# Patient Record
Sex: Male | Born: 1950
Health system: Southern US, Community
[De-identification: ages and names within clinical notes are randomized; demographics above are authoritative.]

## PROBLEM LIST (undated history)

## (undated) DIAGNOSIS — J069 Acute upper respiratory infection, unspecified: Secondary | ICD-10-CM

## (undated) DIAGNOSIS — Z952 Presence of prosthetic heart valve: Secondary | ICD-10-CM

## (undated) DIAGNOSIS — Z8614 Personal history of Methicillin resistant Staphylococcus aureus infection: Secondary | ICD-10-CM

## (undated) DIAGNOSIS — G4700 Insomnia, unspecified: Secondary | ICD-10-CM

## (undated) DIAGNOSIS — M51379 Other intervertebral disc degeneration, lumbosacral region without mention of lumbar back pain or lower extremity pain: Secondary | ICD-10-CM

## (undated) DIAGNOSIS — E785 Hyperlipidemia, unspecified: Secondary | ICD-10-CM

## (undated) DIAGNOSIS — E119 Type 2 diabetes mellitus without complications: Secondary | ICD-10-CM

## (undated) DIAGNOSIS — M5137 Other intervertebral disc degeneration, lumbosacral region: Secondary | ICD-10-CM

## (undated) DIAGNOSIS — K5790 Diverticulosis of intestine, part unspecified, without perforation or abscess without bleeding: Secondary | ICD-10-CM

## (undated) DIAGNOSIS — F329 Major depressive disorder, single episode, unspecified: Secondary | ICD-10-CM

## (undated) DIAGNOSIS — K449 Diaphragmatic hernia without obstruction or gangrene: Secondary | ICD-10-CM

## (undated) DIAGNOSIS — I219 Acute myocardial infarction, unspecified: Secondary | ICD-10-CM

## (undated) DIAGNOSIS — I2510 Atherosclerotic heart disease of native coronary artery without angina pectoris: Secondary | ICD-10-CM

## (undated) DIAGNOSIS — Z87438 Personal history of other diseases of male genital organs: Secondary | ICD-10-CM

## (undated) DIAGNOSIS — K219 Gastro-esophageal reflux disease without esophagitis: Secondary | ICD-10-CM

## (undated) DIAGNOSIS — D696 Thrombocytopenia, unspecified: Secondary | ICD-10-CM

## (undated) DIAGNOSIS — N429 Disorder of prostate, unspecified: Secondary | ICD-10-CM

## (undated) DIAGNOSIS — I10 Essential (primary) hypertension: Secondary | ICD-10-CM

## (undated) DIAGNOSIS — I350 Nonrheumatic aortic (valve) stenosis: Secondary | ICD-10-CM

## (undated) DIAGNOSIS — M1990 Unspecified osteoarthritis, unspecified site: Secondary | ICD-10-CM

## (undated) DIAGNOSIS — I639 Cerebral infarction, unspecified: Secondary | ICD-10-CM

## (undated) DIAGNOSIS — I213 ST elevation (STEMI) myocardial infarction of unspecified site: Secondary | ICD-10-CM

## (undated) DIAGNOSIS — F32A Depression, unspecified: Secondary | ICD-10-CM

## (undated) DIAGNOSIS — IMO0001 Reserved for inherently not codable concepts without codable children: Secondary | ICD-10-CM

## (undated) HISTORY — DX: Presence of prosthetic heart valve: Z95.2

## (undated) HISTORY — DX: Thrombocytopenia, unspecified: D69.6

## (undated) HISTORY — DX: Personal history of Methicillin resistant Staphylococcus aureus infection: Z86.14

## (undated) HISTORY — DX: Cerebral infarction, unspecified: I63.9

## (undated) HISTORY — DX: Depression, unspecified: F32.A

## (undated) HISTORY — DX: Hyperlipidemia, unspecified: E78.5

## (undated) HISTORY — DX: Reserved for inherently not codable concepts without codable children: IMO0001

## (undated) HISTORY — DX: Diaphragmatic hernia without obstruction or gangrene: K44.9

## (undated) HISTORY — DX: Gastro-esophageal reflux disease without esophagitis: K21.9

## (undated) HISTORY — DX: Type 2 diabetes mellitus without complications: E11.9

## (undated) HISTORY — DX: Atherosclerotic heart disease of native coronary artery without angina pectoris: I25.10

## (undated) HISTORY — DX: Personal history of other diseases of male genital organs: Z87.438

## (undated) HISTORY — DX: Other intervertebral disc degeneration, lumbosacral region: M51.37

## (undated) HISTORY — DX: Other intervertebral disc degeneration, lumbosacral region without mention of lumbar back pain or lower extremity pain: M51.379

## (undated) HISTORY — DX: Disorder of prostate, unspecified: N42.9

## (undated) HISTORY — DX: Essential (primary) hypertension: I10

## (undated) HISTORY — DX: Diverticulosis of intestine, part unspecified, without perforation or abscess without bleeding: K57.90

## (undated) HISTORY — PX: NASAL SEPTUM SURGERY: SHX37

## (undated) HISTORY — DX: Insomnia, unspecified: G47.00

## (undated) HISTORY — DX: Major depressive disorder, single episode, unspecified: F32.9

## (undated) HISTORY — DX: Nonrheumatic aortic (valve) stenosis: I35.0

---

## 2003-12-18 ENCOUNTER — Ambulatory Visit (HOSPITAL_BASED_OUTPATIENT_CLINIC_OR_DEPARTMENT_OTHER): Admission: RE | Admit: 2003-12-18 | Discharge: 2003-12-18 | Payer: Self-pay | Admitting: Otolaryngology

## 2003-12-18 ENCOUNTER — Encounter (INDEPENDENT_AMBULATORY_CARE_PROVIDER_SITE_OTHER): Payer: Self-pay | Admitting: *Deleted

## 2003-12-18 ENCOUNTER — Ambulatory Visit (HOSPITAL_COMMUNITY): Admission: RE | Admit: 2003-12-18 | Discharge: 2003-12-18 | Payer: Self-pay | Admitting: Otolaryngology

## 2004-05-31 ENCOUNTER — Encounter (INDEPENDENT_AMBULATORY_CARE_PROVIDER_SITE_OTHER): Payer: Self-pay | Admitting: Specialist

## 2004-05-31 ENCOUNTER — Ambulatory Visit (HOSPITAL_COMMUNITY): Admission: RE | Admit: 2004-05-31 | Discharge: 2004-05-31 | Payer: Self-pay | Admitting: Gastroenterology

## 2004-05-31 ENCOUNTER — Encounter: Payer: Self-pay | Admitting: Gastroenterology

## 2005-02-25 ENCOUNTER — Emergency Department (HOSPITAL_COMMUNITY): Admission: EM | Admit: 2005-02-25 | Discharge: 2005-02-25 | Payer: Self-pay | Admitting: Emergency Medicine

## 2005-02-25 IMAGING — CR DG RIBS W/ CHEST 3+V*L*
3 series · 3 of 3 positions shown · non-contrast
Comparison: None.

CLINICAL DATA: Left-sided chest pain. 
 LEFT RIBS WITH UNILATERAL CHEST ? 3 VIEW:

[view not recorded (1 of 3)]
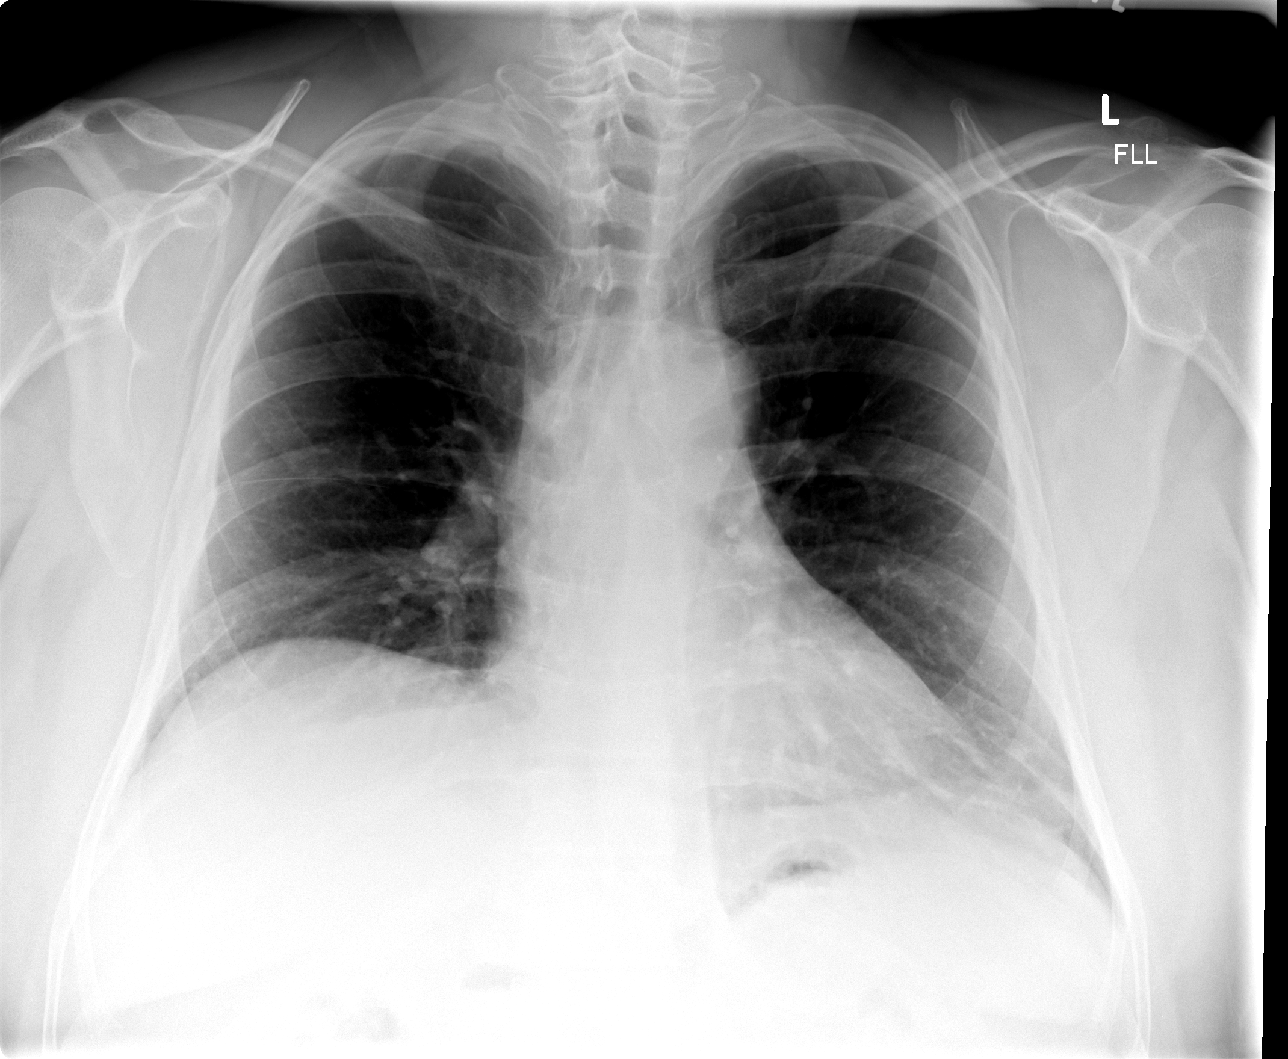

[view not recorded (2 of 3)]
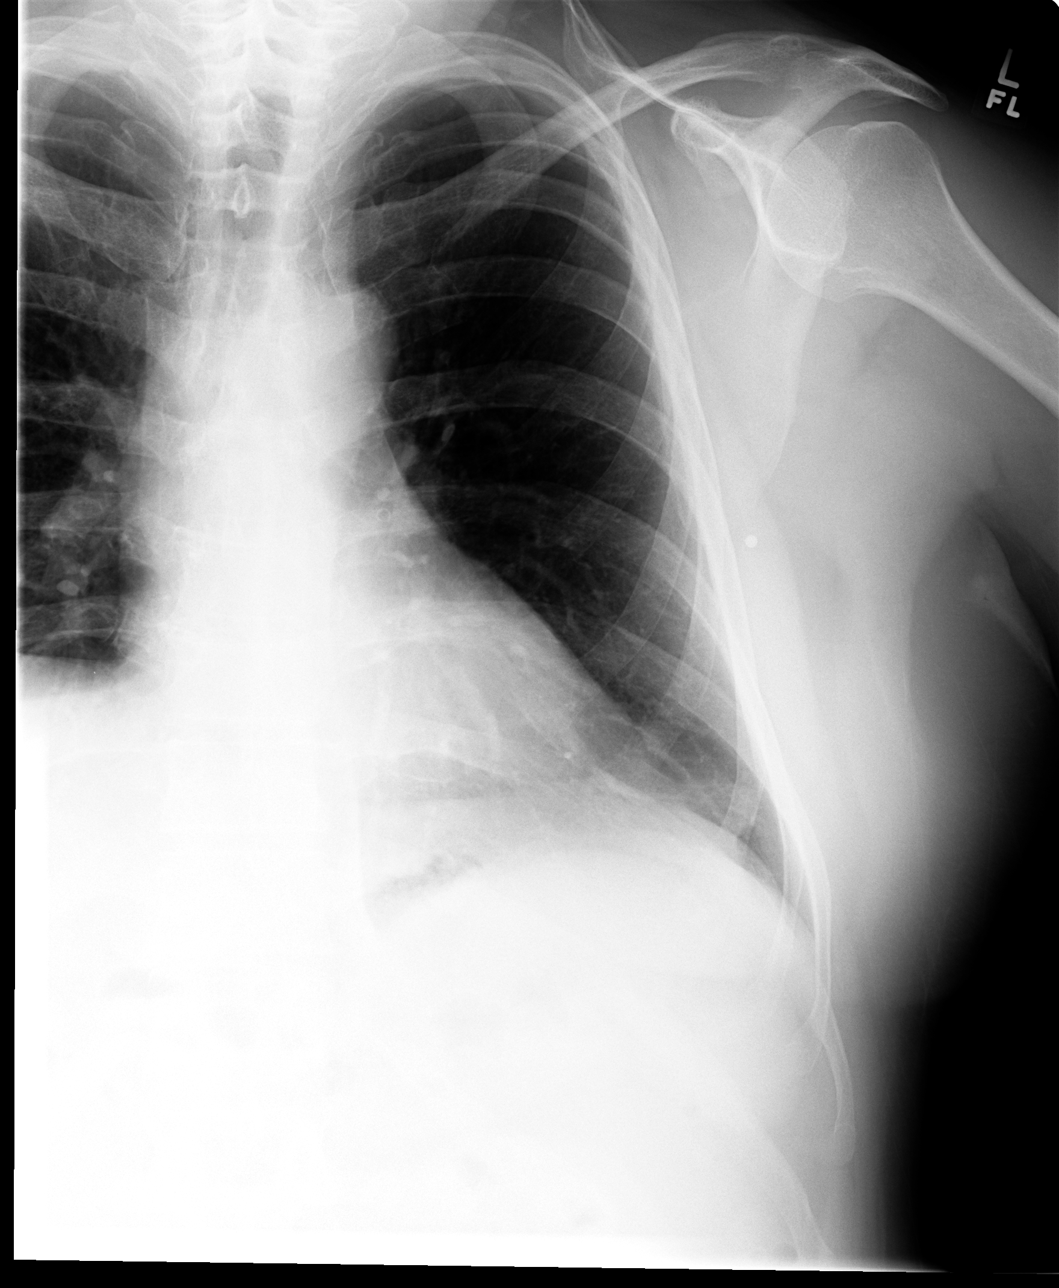

[view not recorded (3 of 3)]
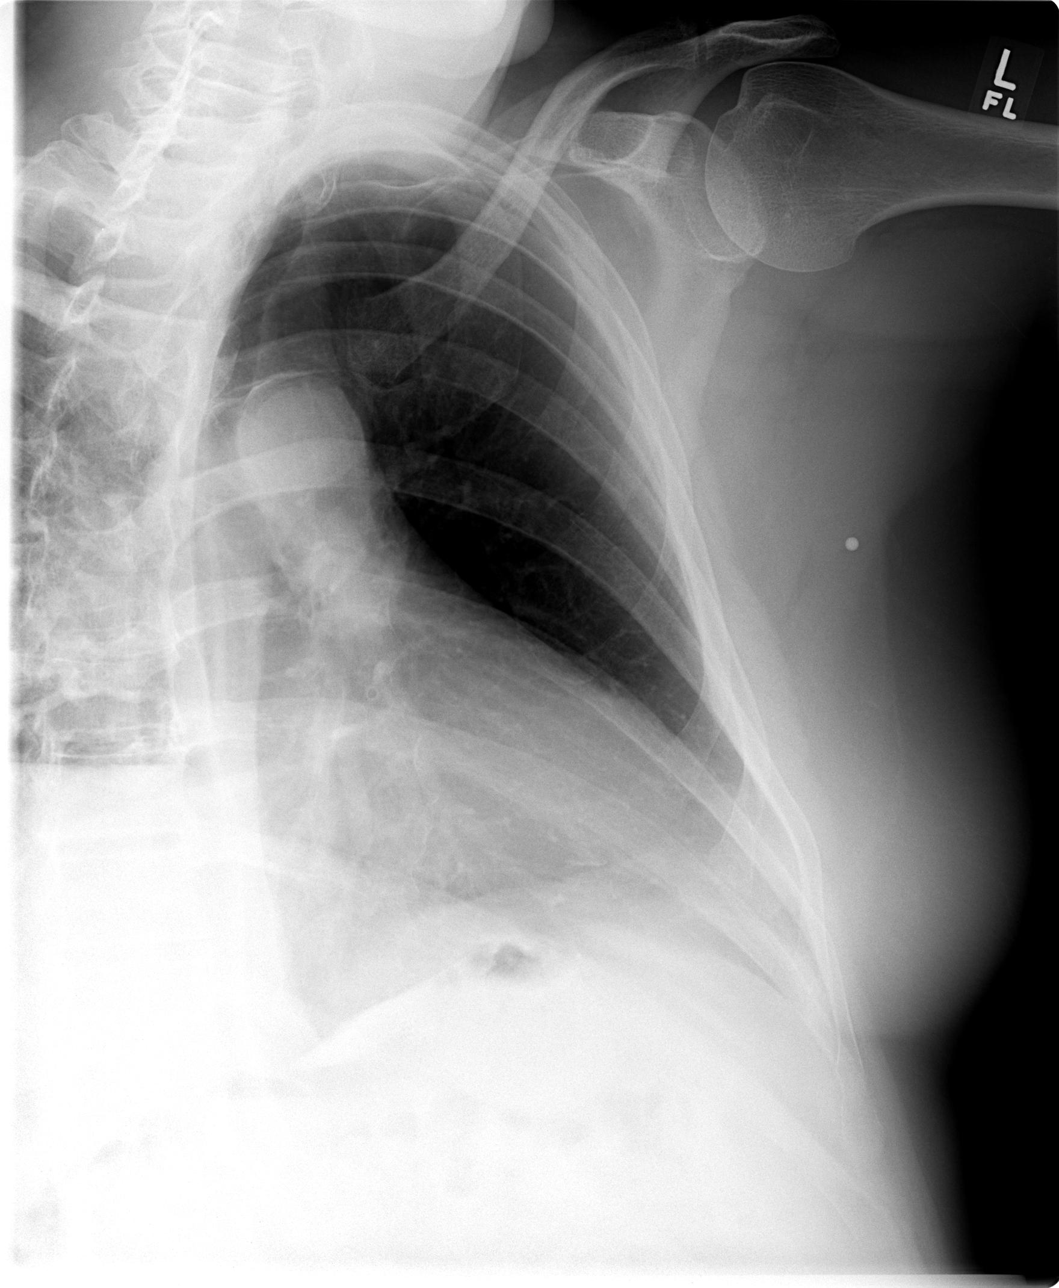

[3 of 3 positions shown; findings below may reference images not displayed]

FINDINGS: PA chest plus coned-down left rib views were obtained with a BB placed at the site of the left chest wall pain.  
 The heart and mediastinum were normal.  There is some streaky atelectasis or scar at the left lung base.  No consolidation or pleural fluid.  There are no definitive rib fractures; however, on the coned-down oblique view, there is a stepoff of the cortex of the anterior third rib, suspicious for fracture.
IMPRESSION: 1.  Probable subtle fracture of the left anterior third rib.
 2.  No other definite rib fractures. 
 3.  Streaky atelectasis or scarring at the left base.

## 2007-04-29 ENCOUNTER — Encounter: Payer: Self-pay | Admitting: Gastroenterology

## 2007-05-14 ENCOUNTER — Encounter: Payer: Self-pay | Admitting: Gastroenterology

## 2007-05-17 ENCOUNTER — Encounter: Payer: Self-pay | Admitting: Gastroenterology

## 2007-05-26 ENCOUNTER — Encounter: Payer: Self-pay | Admitting: Gastroenterology

## 2007-07-08 ENCOUNTER — Ambulatory Visit: Payer: Self-pay | Admitting: Gastroenterology

## 2007-07-08 DIAGNOSIS — R1012 Left upper quadrant pain: Secondary | ICD-10-CM

## 2007-07-08 DIAGNOSIS — D126 Benign neoplasm of colon, unspecified: Secondary | ICD-10-CM

## 2007-07-08 DIAGNOSIS — I10 Essential (primary) hypertension: Secondary | ICD-10-CM | POA: Insufficient documentation

## 2007-07-08 DIAGNOSIS — E785 Hyperlipidemia, unspecified: Secondary | ICD-10-CM

## 2007-07-08 DIAGNOSIS — F3289 Other specified depressive episodes: Secondary | ICD-10-CM | POA: Insufficient documentation

## 2007-07-08 DIAGNOSIS — E782 Mixed hyperlipidemia: Secondary | ICD-10-CM | POA: Insufficient documentation

## 2007-07-08 DIAGNOSIS — F329 Major depressive disorder, single episode, unspecified: Secondary | ICD-10-CM

## 2007-07-08 DIAGNOSIS — K5730 Diverticulosis of large intestine without perforation or abscess without bleeding: Secondary | ICD-10-CM | POA: Insufficient documentation

## 2007-07-21 ENCOUNTER — Ambulatory Visit: Payer: Self-pay | Admitting: Gastroenterology

## 2007-07-21 ENCOUNTER — Encounter: Payer: Self-pay | Admitting: Gastroenterology

## 2007-07-21 ENCOUNTER — Ambulatory Visit (HOSPITAL_COMMUNITY): Admission: RE | Admit: 2007-07-21 | Discharge: 2007-07-21 | Payer: Self-pay | Admitting: Gastroenterology

## 2007-07-21 LAB — CONVERTED CEMR LAB
BUN: 12 mg/dL (ref 6–23)
Creatinine, Ser: 0.8 mg/dL (ref 0.4–1.5)

## 2007-07-21 IMAGING — US US ABDOMEN COMPLETE
1 series · 13 of 25 positions shown · non-contrast
Comparison: None

CLINICAL DATA: Abdominal pain

ABDOMEN ULTRASOUND
TECHNIQUE: Complete abdominal ultrasound examination was performed
including evaluation of the liver, gallbladder, bile ducts,
pancreas, kidneys, spleen, IVC, and abdominal aorta.

[Series 1: unknown · 0.38mm/px · 13 of 97 slices shown]
[im 1/97]
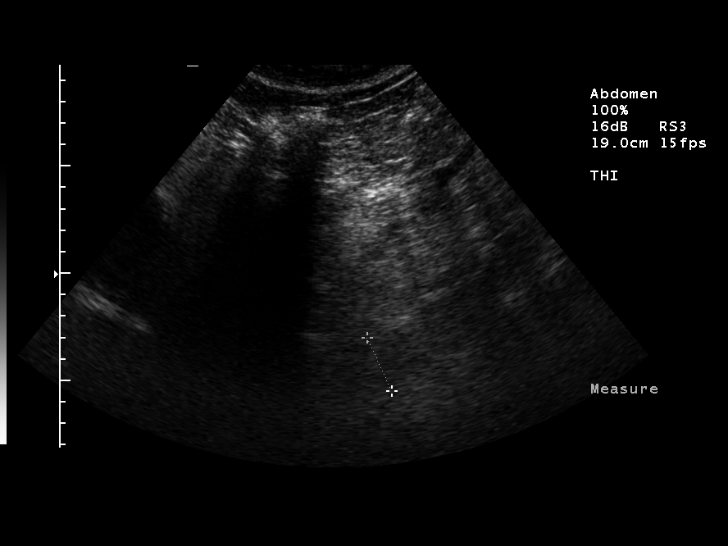
[im 9/97]
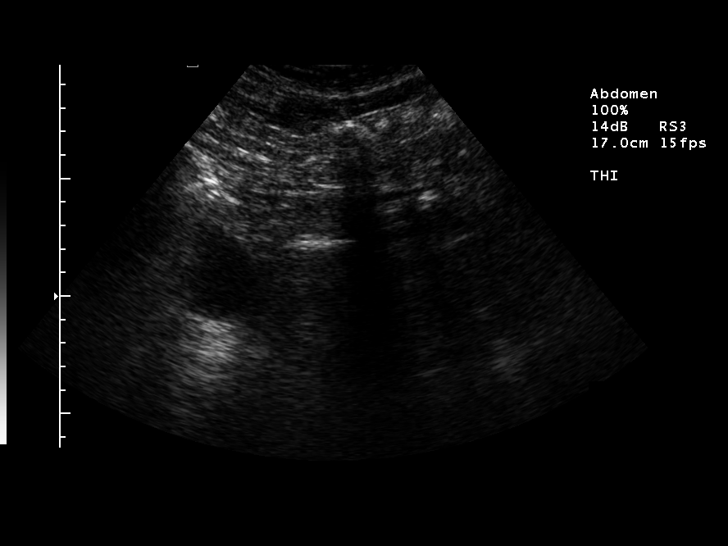
[im 17/97]
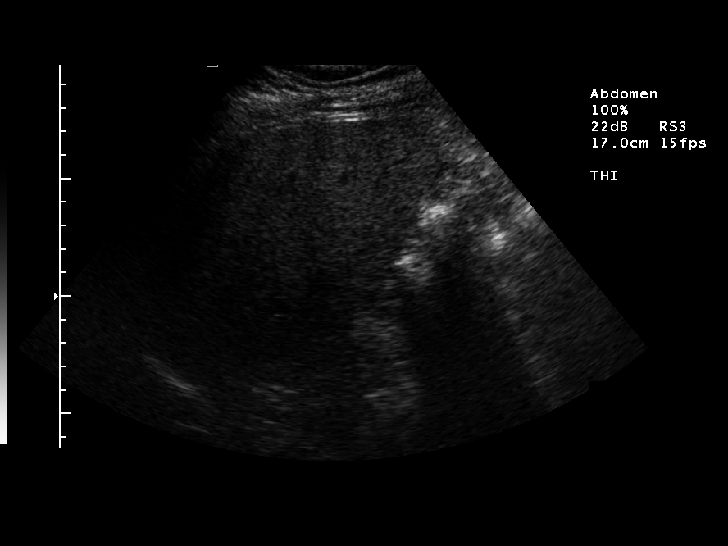
[im 25/97]
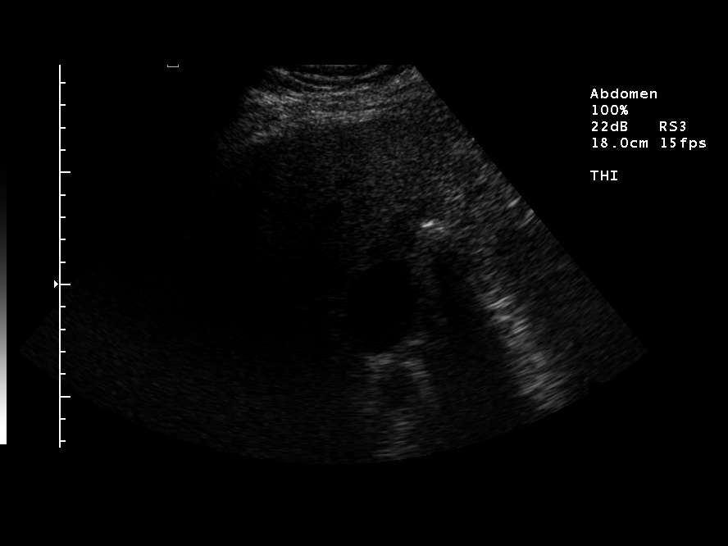
[im 33/97]
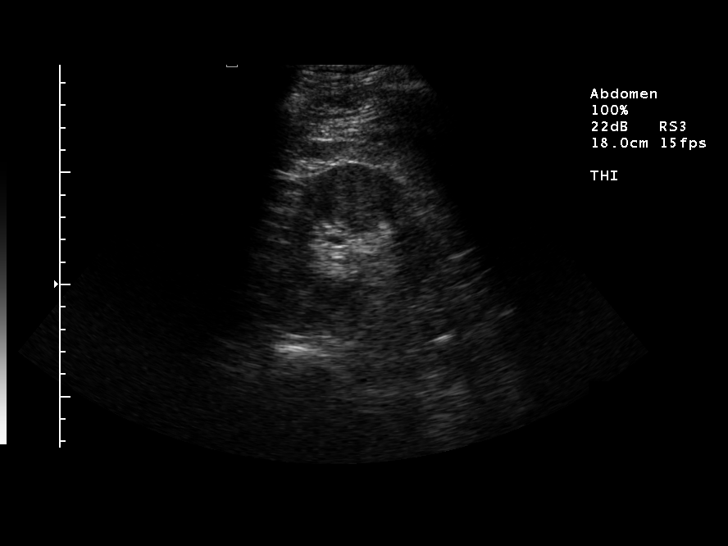
[im 41/97]
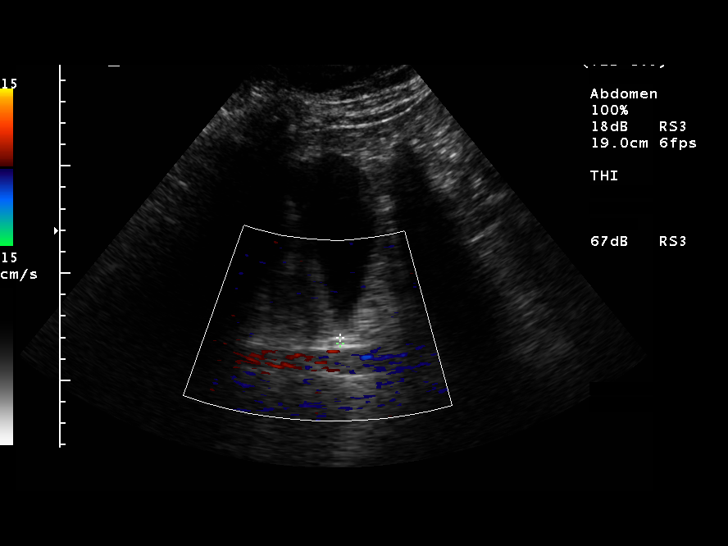
[im 49/97]
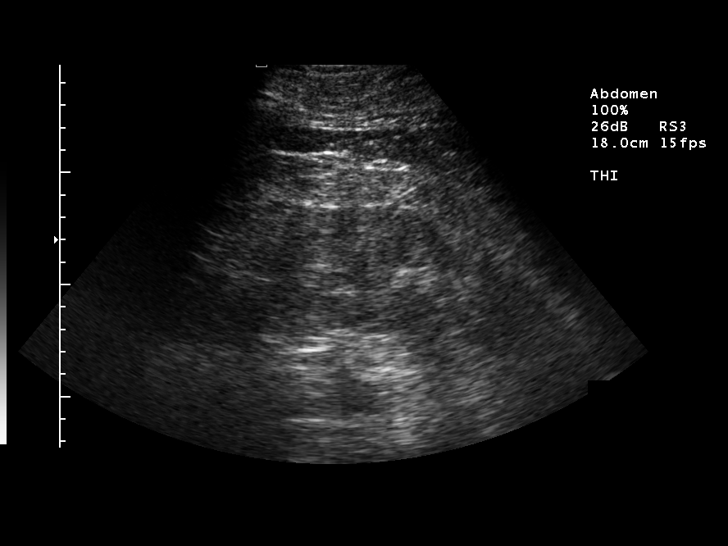
[im 57/97]
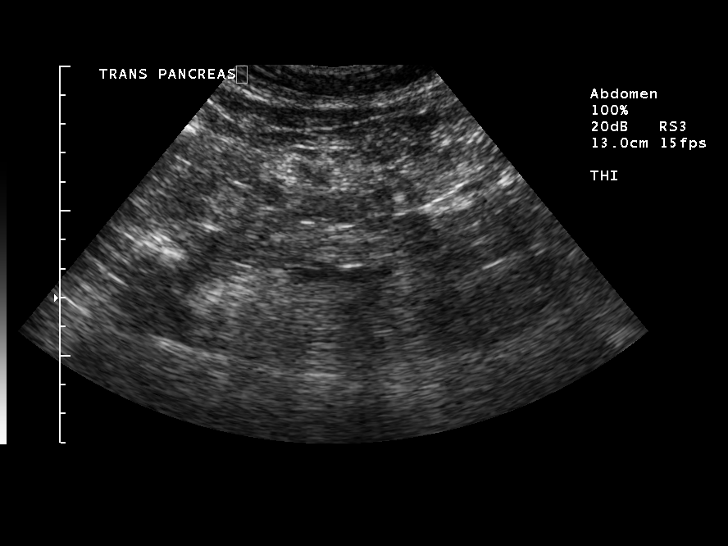
[im 65/97]
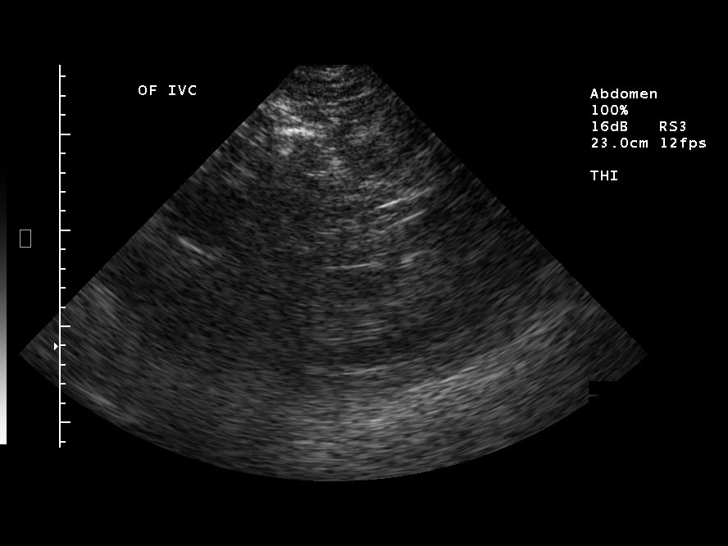
[im 73/97]
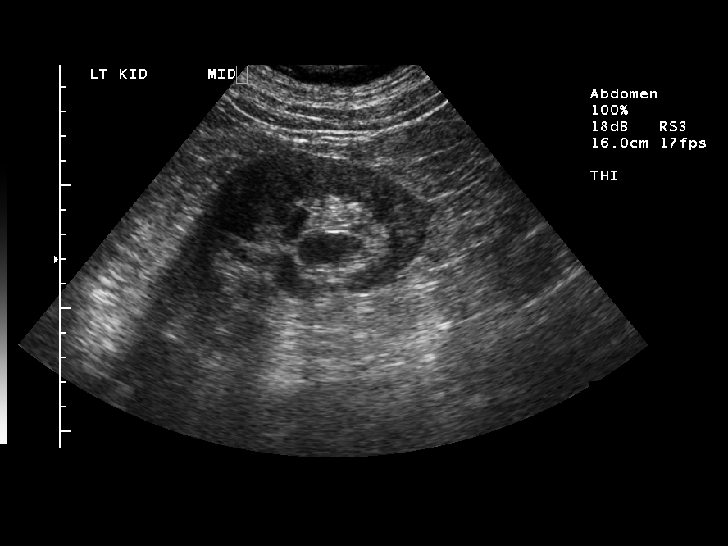
[im 81/97]
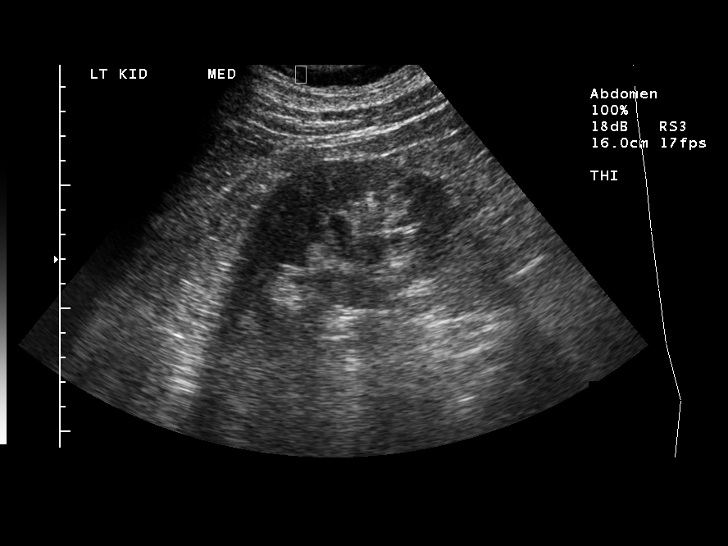
[im 89/97]
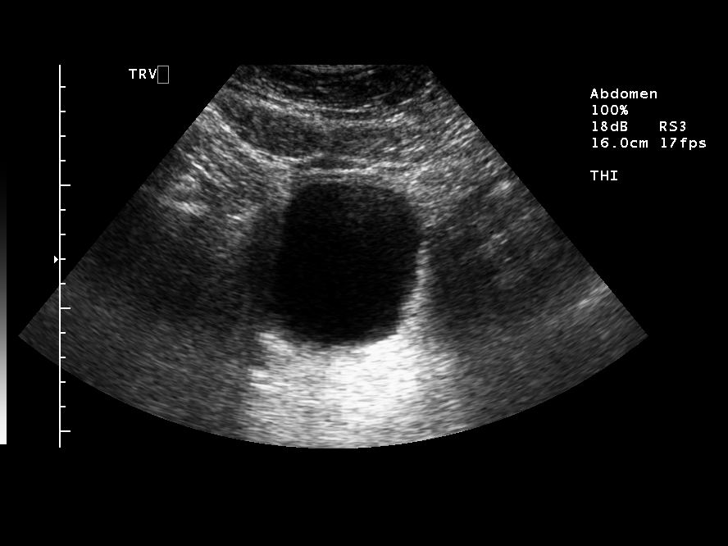
[im 97/97]
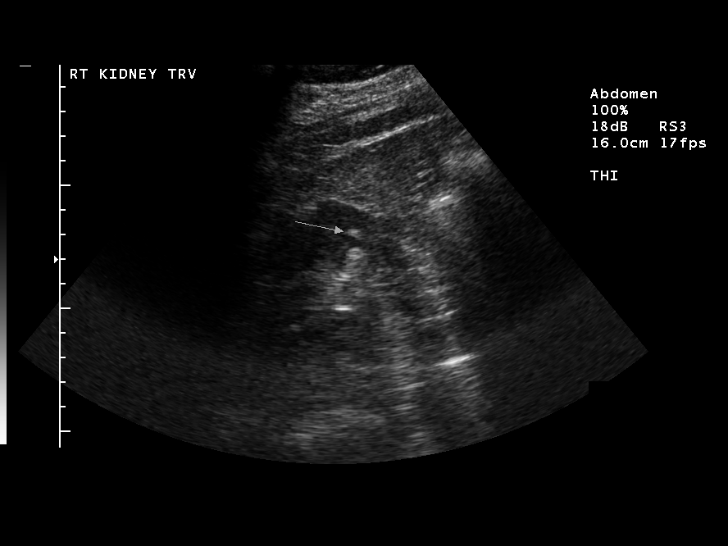

[13 of 25 positions shown; findings below may reference images not displayed]

FINDINGS: There is mild to moderate left hydronephrosis with a
shadowing echogenic stone in the left renal pelvis.  The left
ureter is not well seen.  Left kidney measures 11.1 cm.  The right
kidney measures 10.6 cm with a round echogenic mass-like area in
the cortex measuring 7 mm.

There is dense shadowing with superficial increased echogenicity in
the region of the left hepatic lobe.  Proximal common duct measures
3 mm.  The gallbladder is unremarkable.  The intrahepatic IVC,
pancreas, and proximal and distal abdominal aorta were obscured by
bowel gas.  Mid abdominal aorta measures 2.1 cm.
IMPRESSION: Poor visualization of the left hepatic lobe with possible atrophy
and peripheral calcification.  Further evaluation with liver mass
protocol CT abdomen and CT pelvis with IV contrast and oral
contrast is recommended.  This could be artifactual, although this
raises the question of neoplastic etiologies such as
cholangiocarcinoma or possibly metastasis.

Moderate left hydronephrosis with left renal pelvic calculus.  The
more distal ureter is not evaluated but could be further evaluated
on CT as described above.

Echogenic right renal mass, possibly angiomyolipoma or stone within
a calix.

Findings called to Dr. PLA by Dr. PLA [DATE] at [DATE].

## 2007-07-23 ENCOUNTER — Encounter: Payer: Self-pay | Admitting: Gastroenterology

## 2007-07-26 ENCOUNTER — Ambulatory Visit: Payer: Self-pay | Admitting: Cardiovascular Disease

## 2007-07-26 IMAGING — CT CT ABDOMEN WO/W CM
2 of 9 series · 12 of 46 positions shown, 19 images · IV contrast (Omnipaque 300)
Comparison: [DATE] ultrasound exam.

CLINICAL DATA: Query liver mass on ultrasound.  Left
hydronephrosis.  Suspicion for right renal mass on ultrasound
prostate gland enlargement.  The pelvis is otherwise negative.

CT ABDOMEN WITHOUT AND WITH CONTRAST,CT PELVIS WITH CONTRAST
TECHNIQUE: Multidetector CT imaging of the abdomen was performed
following the standard protocol before and during bolus
administration of intravenous contrast.,Technique:  Multidetector
CT imaging of the pelvis was performed following the standard proto
Contrast: 125 ml [XT] IV and oral contrast media.

[Series 7: ap_p venous 5.0 b30f · axial · portal-venous · 0.89mm/px · z∈[-456,-56]mm · 9 of 102 slices shown, 15 images]
[im 11/102  soft-tissue]
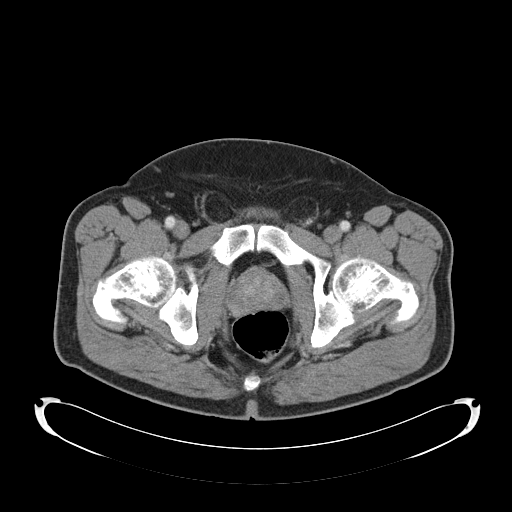
[im 11/102  bone]
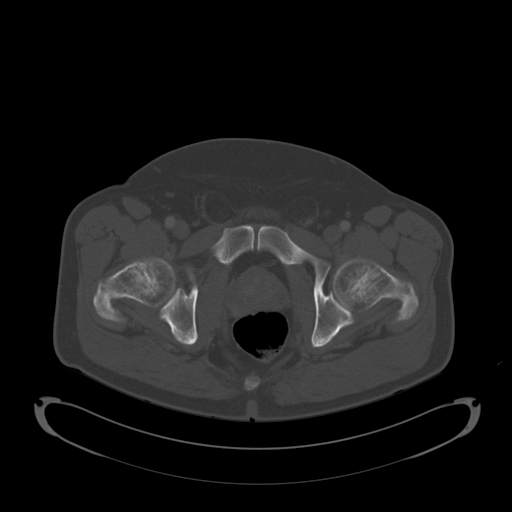
[im 21/102  soft-tissue]
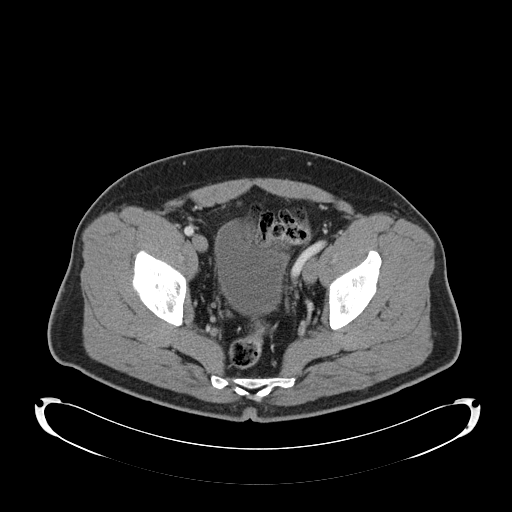
[im 31/102  soft-tissue]
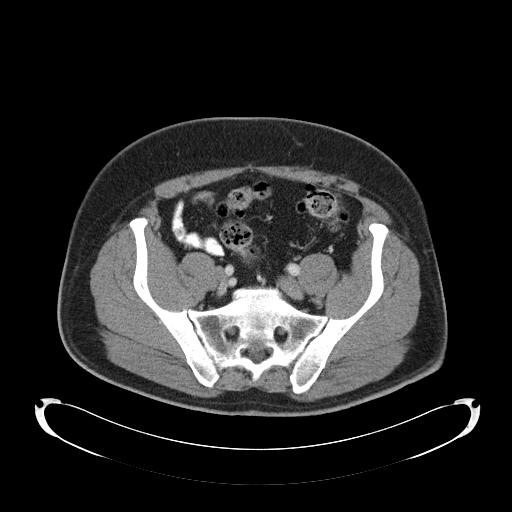
[im 41/102  soft-tissue]
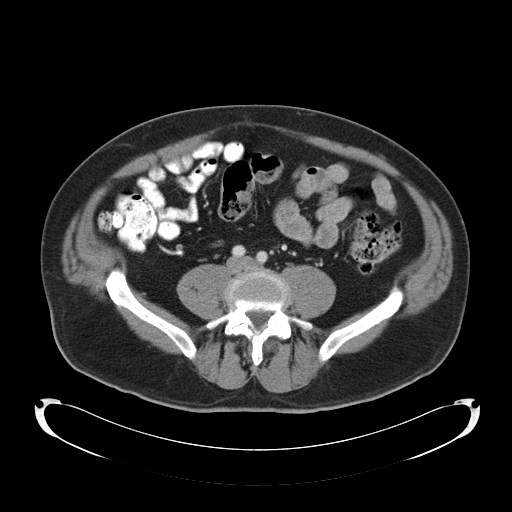
[im 51/102  soft-tissue]
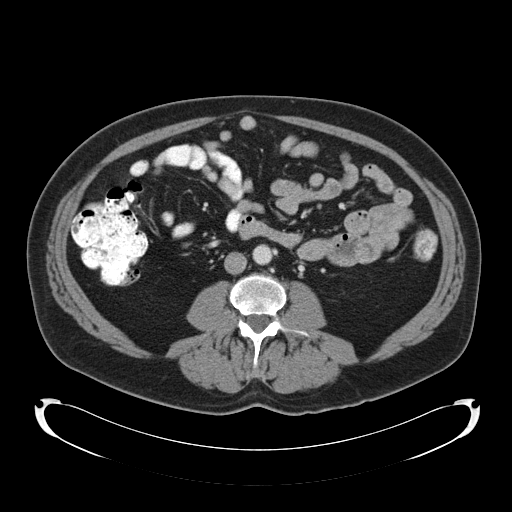
[im 61/102  soft-tissue]
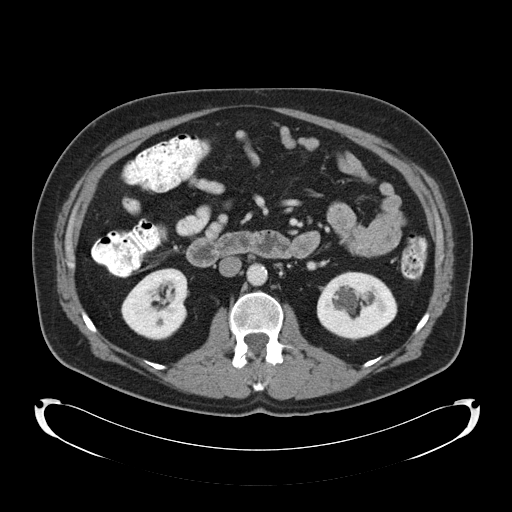
[im 61/102  lung]
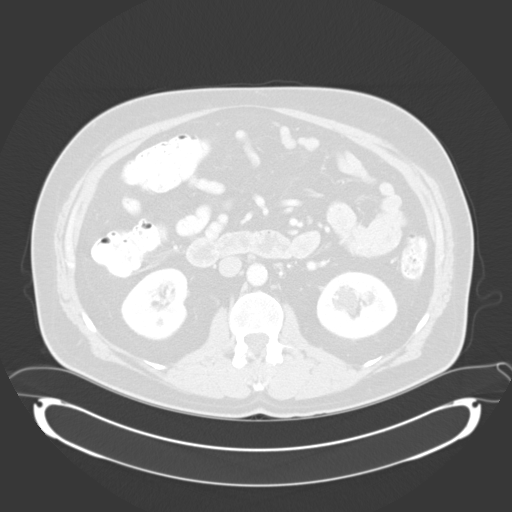
[im 71/102  soft-tissue]
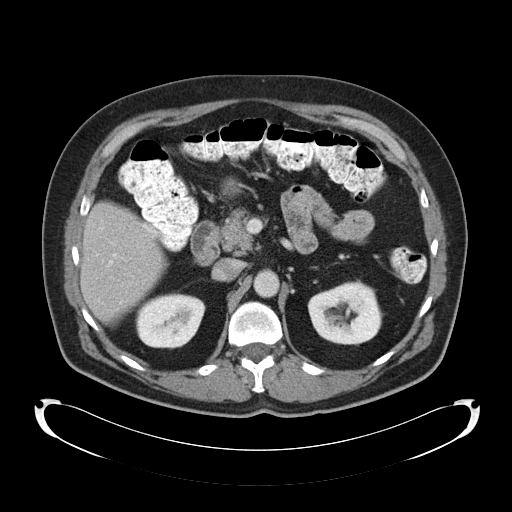
[im 71/102  lung]
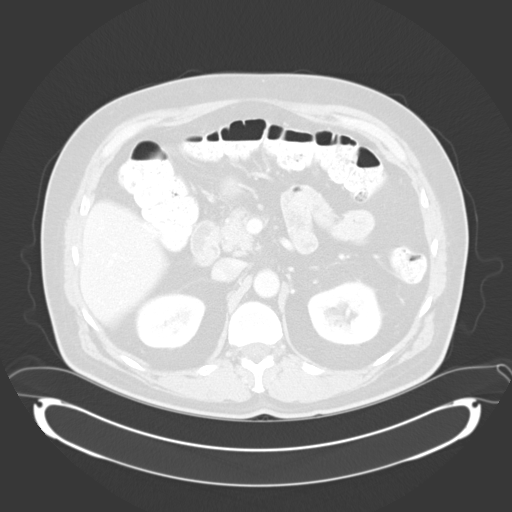
[im 81/102  soft-tissue]
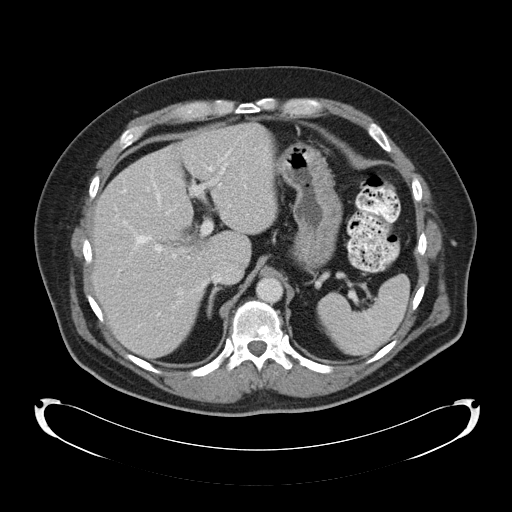
[im 81/102  lung]
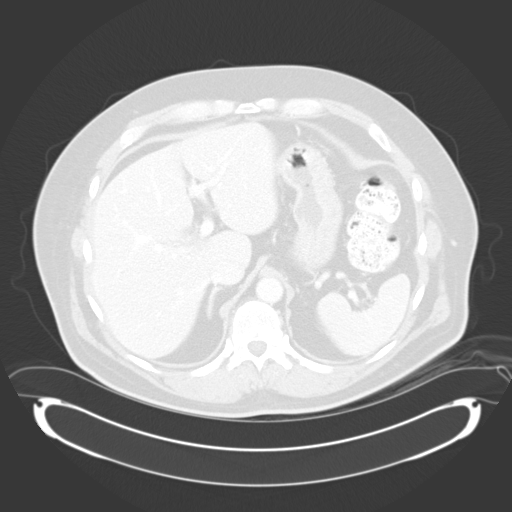
[im 91/102  soft-tissue]
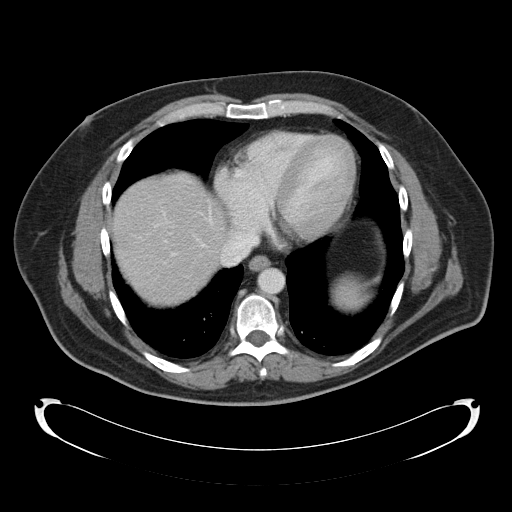
[im 91/102  lung]
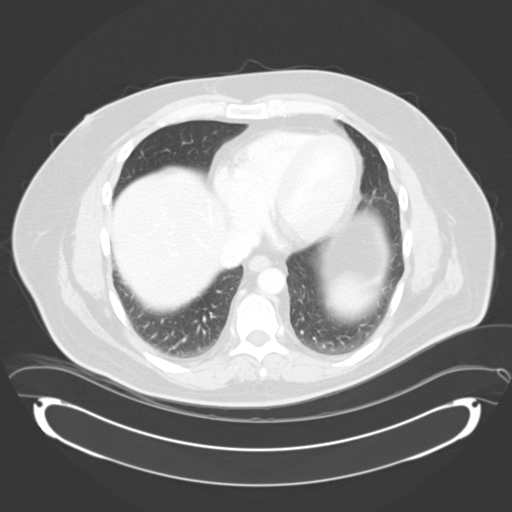
[im 91/102  bone]
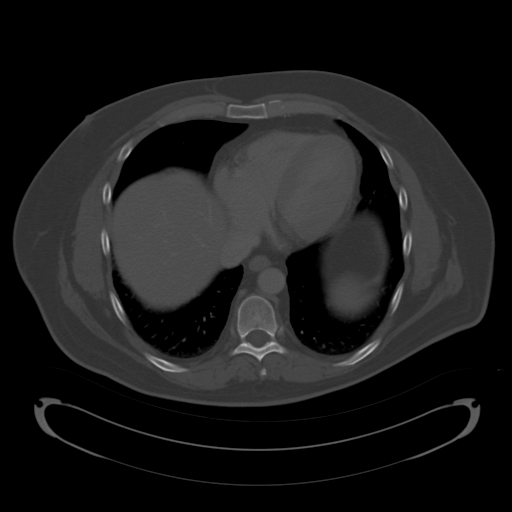

[Series 602: <mpr thick range> · coronal · 0.89mm/px · 3 of 100 slices shown, 4 images]
[im 25/100  soft-tissue]
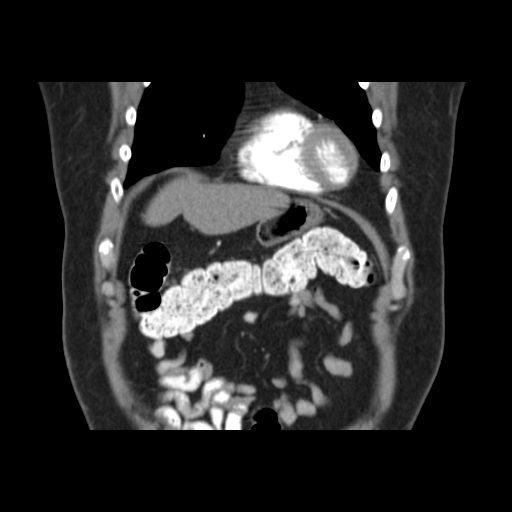
[im 50/100  soft-tissue]
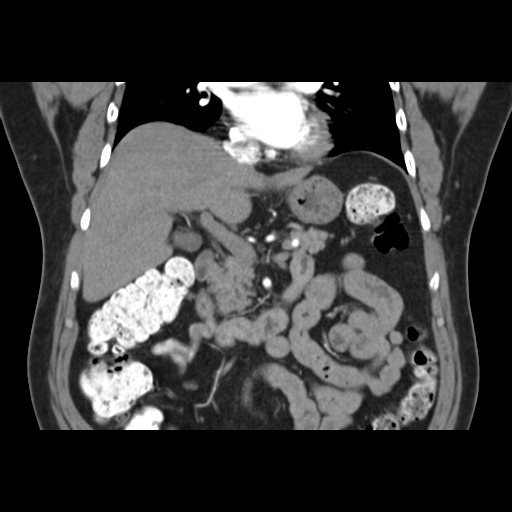
[im 50/100  bone]
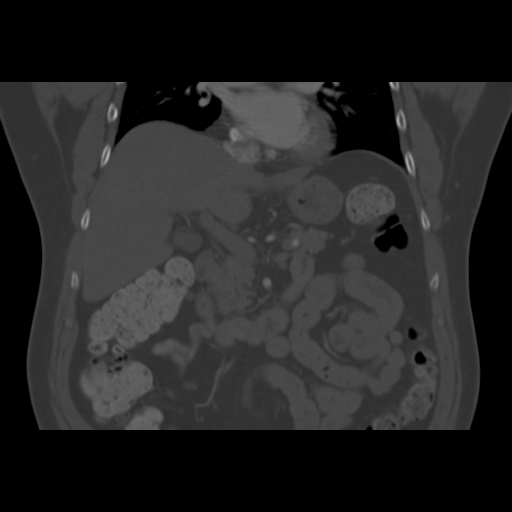
[im 75/100  soft-tissue]
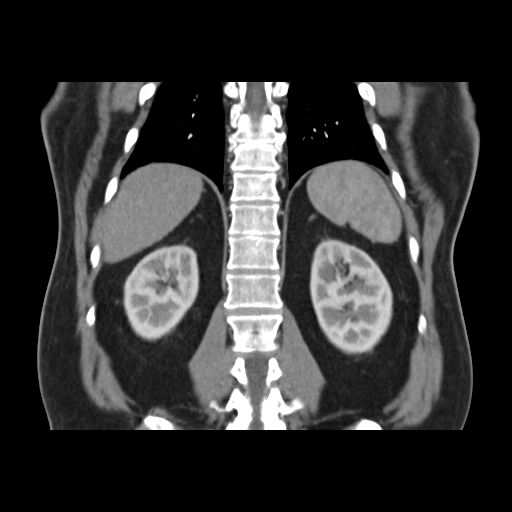

[12 of 46 positions shown; findings below may reference images not displayed]

FINDINGS: There is no evidence of an hepatic mass.  Liver is
homogeneous in attenuation.  No biliary duct dilatation.  Pancreas,
spleen, and adrenal glands appear unremarkable.  Left renal artery
aneurysm is noted within the anterior aspect of the left renal
hilus.  The aneurysm measures 1.6 x 2.2 x 1.9 mm. (see transaxial
image 38, series 7).  Moderate left hydronephrosis.  Dilatation of
the left calyces and renal pelvis.  No ureteral dilatation.  No
evidence of an obstructing calculus.  The findings are compatible
with a left UPJ stenosis. No renal calculus. No parenchymal
atrophy.  7 - 8 mm cyst in the posterior aspect of the mid right
kidney. Portocaval lymph node towards the upper limits of normal in
size measuring 9 mm (image 26).  No pathologically enlarged lymph
nodes. Scattered colonic diverticula.
IMPRESSION: Left renal artery aneurysm.  Unremarkable liver.  Left
hydronephrosis consistent with UPJ stenosis.

CT PELVIS WITH CONTRAST
FINDINGS: PROSTATE GLAND IS PROMINENT SIZE MEASURING 5 CM IN
TRANSVERSE DIMENSION.  SEMINAL VESICLES AND BLADDER UNREMARKABLE.
SIGMOID COLON DIVERTICULA.  NEGATIVE FOR DIVERTICULITIS.
IMPRESSION: PROSTATE GLAND ENLARGEMENT.  COLONIC DIVERTICULAR
DISEASE.

## 2007-07-27 ENCOUNTER — Encounter: Payer: Self-pay | Admitting: Gastroenterology

## 2010-07-12 NOTE — Op Note (Signed)
NAME:  Darrell [name], Darrell [name]                ACCOUNT NO.:  [id number]   MEDICAL RECORD NO.:  [id number]          PATIENT TYPE:  AMB   LOCATION:  DSC                          FACILITY:  [facility]   PHYSICIAN:  Jefry H. Pollyann Kennedy, MD     DATE OF BIRTH:  1950/06/05   DATE OF PROCEDURE:  12/18/2003  DATE OF DISCHARGE:                                 OPERATIVE REPORT   PREOPERATIVE DIAGNOSES:  1.  Nasal septal deviation.  2.  Inferior turbinate hypertrophy.   POSTOPERATIVE DIAGNOSES:  1.  Nasal septal deviation.  2.  Inferior turbinate hypertrophy.  3.  Nasal polyposis.   OPERATION PERFORMED:  1.  Nasal septoplasty.  2.  Bilateral submucous resection of inferior turbinates.  3.  Bilateral nasal polypectomy.   SURGEON:  Jefry H. Pollyann Kennedy, MD   ANESTHESIA:  General endotracheal.   COMPLICATIONS:  None.  The patient tolerated the procedure well, was  awakened, extubated and transferred to recovery in stable condition.   ESTIMATED BLOOD LOSS:  40 mL.   FINDINGS:  Severe leftward nasal septal deviation anteriorly with a  posterior rightward deviation.  Bilateral inferior turbinate hypertrophy.  Bilateral middle and superior meatus polypoid disease extensive.   INDICATIONS FOR PROCEDURE:  The patient is a 61 year old gentleman with a  longstanding severe nasal obstruction that has failed medical therapy.  He  has a history of nasal trauma in the past that preceded this obstruction.  The risks, benefits, alternatives and complications of the procedure were  explained to the patient, who seemed to understand and agreed to surgery.   DESCRIPTION OF PROCEDURE:  The patient was taken to the operating room and  placed on the operating table in the supine position.  Following induction  of general endotracheal anesthesia, the face was prepped and draped in  standard fashion.  Oxymetazoline spray was used preoperatively in the nasal  cavities.  1% Xylocaine with epinephrine was infiltrated into the septum,  the columella and the inferior turbinates bilaterally.  Somewas also  injected into the left polypoid mass that was visualized after decongesting  the nose.  Afrin-soaked pledgets were placed bilaterally in the nasal  cavities as well.   (1) Nasal septoplasty.  A left hemitransfixion incision was created with a  15 scalpel and a mucosal flap was developed down the left side using a Market researcher.  There was a large spur along the left side of the attachment of  the maxillary crest and the quadrangular cartilage and continuing back to  the vomerian bone.  Care was taken to elevate mucosa without tearing it.  The bony cartilaginous junction was divided and a similar mucosal flap was  developed down the right side posteriorly to the sphenoid as well.  The  ethmoid plate was severely deflected to the left and was resected in its  entirety using Jansen-Middleton rongeurs.  The vomer was removed as well  because of the severe deflection.  A 4 mm osteotome was used to shave off  part of the maxillary crest on the left to relieve the anterior obstruction.  The remainder of  the cartilage was midline and straight and was kept intact.  The mucosa incision was reapproximated with a 4-0 chromic suture.  A plain  gut 4-0 quilting suture was then used to quilt the mucosal flaps.   (2) Submucous resection of inferior turbinates bilaterally.  The leading  edge of the inferior turbinates was incised in a vertical fashion with a 15  scalpel.  A Freer elevator was used to elevate mucosa off of the turbinate  bone bilaterally.  Fragments of bone were resected using Takahashi forceps.  The turbinate remnants were outfractured with a Therapist, nutritional.  After the  nose was decongested at the beginning of the procedure, it became evident  that there was severe polypoid disease bilaterally.  As much of the polyp  that could be visualized using nasal speculum and a head light were removed  using Wild Blakesley  forceps and Takahashi forceps.  They emanated from the  middle meatus bilaterally and from the superior meatus bilaterally.  The  polypoid disease was worse on the left side.  All of the polyp was sent  together as one specimen.  These were sent marked as nasal polyps and sent  for pathologic evaluation.  The nasal cavity was suctioned of blood and  secretions and packed with rolled up Telfa coated with bacitracin. The  pharynx was suctioned of blood and secretions under direct visualization.  The patient was then awakened, extubated and transferred to recovery in  stable condition.      Jefr   JHR/MEDQ  D:  12/18/2003  T:  12/18/2003  Job:  [id number]

## 2010-07-12 NOTE — Op Note (Signed)
NAME:  Darrell [name], Darrell [name] NO.:  [id number]   MEDICAL RECORD NO.:  [id number]          PATIENT TYPE:  AMB   LOCATION:  ENDO                         FACILITY:  [facility]   PHYSICIAN:  Petra Kuba, M.D.    DATE OF BIRTH:  1950-10-16   DATE OF PROCEDURE:  05/31/2004  DATE OF DISCHARGE:                                 OPERATIVE REPORT   PROCEDURE:  Colonoscopy with biopsy.   INDICATIONS:  Screening, left upper quadrant pain which is actually  improved. Consent was signed after risks, benefits, methods, options were  thoroughly discussed in the office.   MEDICINES USED:  Demerol 70, Versed 7.   PROCEDURE:  Rectal inspection is pertinent for external hemorrhoids, small.  Digital exam was negative. The video colonoscope was inserted and easily  advanced to the mid transverse. On insertion some significant descending  diverticula were seen. Interestingly there were none in the sigmoid. In the  more proximal descending, a tiny polyp was seen and was cold biopsied x1 on  insertion.  To advance to the cecum required abdominal pressure but no other  position changes,  some right sided diverticula primarily in the ascending  colon were seen. The cecum was identified by the appendiceal orifice and the  ileocecal valve. The prep was adequate. There was some liquid stool that  required washing and suctioning and on slow withdrawal through the colon,  the cecum, ascending and transverse were normal except for the ascending  diverticula. There were not many seen in the transverse. The scope was  slowly withdrawn back to the descending and the polyp seen on insertion and  the biopsy site was seen and other cold biopsy was obtained. There did not  seem to be any other residual polypoid tissue. The scope was further  withdrawn. Other than the remainder of the descending diverticula, no other  polypoid lesions were seen as we slowly withdrew back to the rectum.  Anorectal pull-through and  retroflexion confirmed some small hemorrhoids.  The scope was straightened and readvanced a short ways up the left side of  the colon, air was suctioned, scope removed. The patient tolerated the  procedure well. There was no obvious immediate complication.   ENDOSCOPIC DIAGNOSIS:  1.  Internal and external hemorrhoids.  2.  Primarily descending and ascending diverticula.  3.  Tiny proximal descending polyp cold biopsied.  4.  Otherwise within normal limits to the cecum.   PLAN:  Await pathology. Probably recheck colon screening in five years.  Happy to see back p.r.n. otherwise return care to  Dr. Julian Reil for the  customary health care maintenance to include yearly rectals and guaiacs. If  pain continues might consider upper GI small bowel follow-through or CAT  scan next depending on symptoms at that time.      MEM/MEDQ  D:  05/31/2004  T:  05/31/2004  Job:  [id number]   cc:   Schuyler Amor, M.D.  [address]  [place], [place] [address]  Fax: [phone]

## 2010-09-02 ENCOUNTER — Encounter: Payer: Self-pay | Admitting: Family Medicine

## 2010-09-02 DIAGNOSIS — G4700 Insomnia, unspecified: Secondary | ICD-10-CM | POA: Insufficient documentation

## 2010-09-02 DIAGNOSIS — Z87438 Personal history of other diseases of male genital organs: Secondary | ICD-10-CM | POA: Insufficient documentation

## 2010-09-02 DIAGNOSIS — E119 Type 2 diabetes mellitus without complications: Secondary | ICD-10-CM | POA: Insufficient documentation

## 2010-09-02 DIAGNOSIS — K219 Gastro-esophageal reflux disease without esophagitis: Secondary | ICD-10-CM | POA: Insufficient documentation

## 2011-04-21 ENCOUNTER — Emergency Department (HOSPITAL_COMMUNITY)
Admission: EM | Admit: 2011-04-21 | Discharge: 2011-04-21 | Disposition: A | Discharge status: Home Health | Payer: Worker's Compensation | Attending: Emergency Medicine | Admitting: Emergency Medicine

## 2011-04-21 ENCOUNTER — Encounter (HOSPITAL_COMMUNITY): Payer: Self-pay | Admitting: Emergency Medicine

## 2011-04-21 DIAGNOSIS — F3289 Other specified depressive episodes: Secondary | ICD-10-CM | POA: Insufficient documentation

## 2011-04-21 DIAGNOSIS — S81009A Unspecified open wound, unspecified knee, initial encounter: Secondary | ICD-10-CM | POA: Insufficient documentation

## 2011-04-21 DIAGNOSIS — E785 Hyperlipidemia, unspecified: Secondary | ICD-10-CM | POA: Insufficient documentation

## 2011-04-21 DIAGNOSIS — M79609 Pain in unspecified limb: Secondary | ICD-10-CM | POA: Insufficient documentation

## 2011-04-21 DIAGNOSIS — IMO0002 Reserved for concepts with insufficient information to code with codable children: Secondary | ICD-10-CM | POA: Insufficient documentation

## 2011-04-21 DIAGNOSIS — S81809A Unspecified open wound, unspecified lower leg, initial encounter: Secondary | ICD-10-CM | POA: Insufficient documentation

## 2011-04-21 DIAGNOSIS — Z7982 Long term (current) use of aspirin: Secondary | ICD-10-CM | POA: Insufficient documentation

## 2011-04-21 DIAGNOSIS — E119 Type 2 diabetes mellitus without complications: Secondary | ICD-10-CM | POA: Insufficient documentation

## 2011-04-21 DIAGNOSIS — I10 Essential (primary) hypertension: Secondary | ICD-10-CM | POA: Insufficient documentation

## 2011-04-21 DIAGNOSIS — K219 Gastro-esophageal reflux disease without esophagitis: Secondary | ICD-10-CM | POA: Insufficient documentation

## 2011-04-21 DIAGNOSIS — Z79899 Other long term (current) drug therapy: Secondary | ICD-10-CM | POA: Insufficient documentation

## 2011-04-21 DIAGNOSIS — F329 Major depressive disorder, single episode, unspecified: Secondary | ICD-10-CM | POA: Insufficient documentation

## 2011-04-21 MED ORDER — TETANUS-DIPHTH-ACELL PERTUSSIS 5-2.5-18.5 LF-MCG/0.5 IM SUSP
0.5000 mL | Freq: Once | INTRAMUSCULAR | Status: AC
  Administered 2011-04-21: 0.5 mL via INTRAMUSCULAR
  Filled 2011-04-21: qty 0.5

## 2011-04-21 NOTE — ED Notes (Signed)
States taking low dose aspirin everyday

## 2011-04-21 NOTE — ED Provider Notes (Signed)
History     CSN: [id number]  Arrival date & time 04/21/11  0710   First MD Initiated Contact with Patient 04/21/11 [phone]      Chief Complaint  Patient presents with  . Laceration    (Consider location/radiation/quality/duration/timing/severity/associated sxs/prior treatment) HPI Comments: The patient presents with a left shin laceration that happened this morning. His co-worker was splitting wood with an ax when the ax flew out of the co-worker's hand and hit the patient's leg, he explains. The patient describes the shin pain as throbbing and is rated 5/10 currently. He has not taken anything for the pain. The patient denies any numbness/tingling,weakness of the leg, gait disturbances, and any other injury. Last tetanus vx was 7-8 years ago.    Patient is a 61 y.o. [name] presenting with skin laceration. The history is provided by the patient.  Laceration     Past Medical History  Diagnosis Date  . Hyperlipidemia   . Hypertension   . Depression   . Diverticulosis   . GERD (gastroesophageal reflux disease)   . Insomnia   . NIDDM (non-insulin dependent diabetes mellitus)   . Aortic stenosis   . History of BPH     History reviewed. No pertinent past surgical history.  History reviewed. No pertinent family history.  History  Substance Use Topics  . Smoking status: Not on file  . Smokeless tobacco: Not on file  . Alcohol Use: Not on file      Review of Systems  Constitutional: Negative for fever.  Neurological: Negative for syncope, weakness and numbness.  All other systems reviewed and are negative.    Allergies  Review of patient's allergies indicates no active allergies.  Home Medications   Current Outpatient Rx  Name Route Sig Dispense Refill  . ASPIRIN 81 MG PO TABS Oral Take 81 mg by mouth daily.      Marland Kitchen GLIPIZIDE PO Oral Take 1 tablet by mouth daily.     Marland Kitchen LISINOPRIL PO Oral Take by mouth.    . METFORMIN HCL ER (MOD) 1000 MG PO TB24 Oral Take 1,000 mg by  mouth 2 (two) times daily with a meal.      . ROSUVASTATIN CALCIUM 20 MG PO TABS Oral Take 20 mg by mouth daily.      . TRAMADOL HCL 50 MG PO TABS Oral Take 50 mg by mouth every 6 (six) hours as needed. For pain      BP 139/96  Pulse 82  Temp(Src) 97.3 F (36.3 C) (Oral)  Resp 20  SpO2 99%  Physical Exam  Nursing note and vitals reviewed. Constitutional: He is oriented to person, place, and time. He appears well-developed and well-nourished.  HENT:  Head: Normocephalic and atraumatic.  Neck: Neck supple.  Pulmonary/Chest: Effort normal.  Musculoskeletal:       Left lower leg: He exhibits laceration. He exhibits no bony tenderness.       Dorsalis pedis and posterior tibialis pulses intact.  Ankle and toes with full AROM.  Sensation intact.  Strength 5/5.    Neurological: He is alert and oriented to person, place, and time.  Skin:     Psychiatric: He has a normal mood and affect. His behavior is normal.    ED Course  Procedures (including critical care time)  Labs Reviewed - No data to display No results found.  LACERATION REPAIR Performed by: Rise Patience  supervising Emilia Beck, PA-S  Consent: Verbal consent obtained. Risks and benefits: risks, benefits and alternatives were  discussed Patient identity confirmed: provided demographic data Time out performed prior to procedure Prepped and Draped in normal sterile fashion Wound explored  Laceration Location: left anterior lower leg  Laceration Length: 2cm  No Foreign Bodies seen or palpated  Anesthesia: local infiltration  Local anesthetic: lidocaine 2% no epinephrine  Anesthetic total: 3 ml  Irrigation method: syringe Amount of cleaning: standard  Skin closure: 4-0 prolene  Number of sutures or staples: 2  Technique: simple interrupted  2 sutures placed at inferior aspect of laceration.  Superior aspect is superficial, no need for sutures.    Patient tolerance: Patient tolerated the procedure  well with no immediate complications.    1. Laceration       MDM  Patient with injury to left shin while splitting wood.  Coworker's ax slipped out of his hand and hit patient's left shin.  There is no bony tenderness, no neurological deficits.  Distal pulses are intact.  Laceration repaired in ED.  D/C home with care instruction, precautions/reasons for immediate return, f/u in 7-10 days for wound recheck and suture removal.  Patient verbalizes understanding and agrees with plan.          Rise Patience, [place] 04/21/11 0944  Rise Patience, [place] 04/21/11 [phone]

## 2011-04-21 NOTE — ED Notes (Signed)
Pt here with approx 1 inch laceration to shin from axe; bleeding controlled; pt sts not UTD on TD

## 2011-04-21 NOTE — Discharge Instructions (Signed)
Please read the information below.  Keep the wound clean and covered with a thin layer of antibiotic ointment.  Please see your doctor, the urgent care, or return to the ER in 7-10 days for a wound check and suture removal.  Return to the ER immediately if you develop redness, swelling, pus draining from the wound, or fevers greater than 100.4.  You may return to the ER at any time for worsening condition or any new symptoms that concern you.     Laceration Care, Adult A laceration is a cut or lesion that goes through all layers of the skin and into the tissue just beneath the skin. TREATMENT  Some lacerations may not require closure. Some lacerations may not be able to be closed due to an increased risk of infection. It is important to see your caregiver as soon as possible after an injury to minimize the risk of infection and maximize the opportunity for successful closure. If closure is appropriate, pain medicines may be given, if needed. The wound will be cleaned to help prevent infection. Your caregiver will use stitches (sutures), staples, wound glue (adhesive), or skin adhesive strips to repair the laceration. These tools bring the skin edges together to allow for faster healing and a better cosmetic outcome. However, all wounds will heal with a scar. Once the wound has healed, scarring can be minimized by covering the wound with sunscreen during the day for 1 full year. HOME CARE INSTRUCTIONS  For sutures or staples:  Keep the wound clean and dry.   If you were given a bandage (dressing), you should change it at least once a day. Also, change the dressing if it becomes wet or dirty, or as directed by your caregiver.   Wash the wound with soap and water 2 times a day. Rinse the wound off with water to remove all soap. Pat the wound dry with a clean towel.   After cleaning, apply a thin layer of the antibiotic ointment as recommended by your caregiver. This will help prevent infection and  keep the dressing from sticking.   You may shower as usual after the first 24 hours. Do not soak the wound in water until the sutures are removed.   Only take over-the-counter or prescription medicines for pain, discomfort, or fever as directed by your caregiver.   Get your sutures or staples removed as directed by your caregiver.  For skin adhesive strips:  Keep the wound clean and dry.   Do not get the skin adhesive strips wet. You may bathe carefully, using caution to keep the wound dry.   If the wound gets wet, pat it dry with a clean towel.   Skin adhesive strips will fall off on their own. You may trim the strips as the wound heals. Do not remove skin adhesive strips that are still stuck to the wound. They will fall off in time.  For wound adhesive:  You may briefly wet your wound in the shower or bath. Do not soak or scrub the wound. Do not swim. Avoid periods of heavy perspiration until the skin adhesive has fallen off on its own. After showering or bathing, gently pat the wound dry with a clean towel.   Do not apply liquid medicine, cream medicine, or ointment medicine to your wound while the skin adhesive is in place. This may loosen the film before your wound is healed.   If a dressing is placed over the wound, be careful not to apply  tape directly over the skin adhesive. This may cause the adhesive to be pulled off before the wound is healed.   Avoid prolonged exposure to sunlight or tanning lamps while the skin adhesive is in place. Exposure to ultraviolet light in the first year will darken the scar.   The skin adhesive will usually remain in place for 5 to 10 days, then naturally fall off the skin. Do not pick at the adhesive film.  You may need a tetanus shot if:  You cannot remember when you had your last tetanus shot.   You have never had a tetanus shot.  If you get a tetanus shot, your arm may swell, get red, and feel warm to the touch. This is common and not a  problem. If you need a tetanus shot and you choose not to have one, there is a rare chance of getting tetanus. Sickness from tetanus can be serious. SEEK MEDICAL CARE IF:   You have redness, swelling, or increasing pain in the wound.   You see a red line that goes away from the wound.   You have yellowish-white fluid (pus) coming from the wound.   You have a fever.   You notice a bad smell coming from the wound or dressing.   Your wound breaks open before or after sutures have been removed.   You notice something coming out of the wound such as wood or glass.   Your wound is on your hand or foot and you cannot move a finger or toe.  SEEK IMMEDIATE MEDICAL CARE IF:   Your pain is not controlled with prescribed medicine.   You have severe swelling around the wound causing pain and numbness or a change in color in your arm, hand, leg, or foot.   Your wound splits open and starts bleeding.   You have worsening numbness, weakness, or loss of function of any joint around or beyond the wound.   You develop painful lumps near the wound or on the skin anywhere on your body.  MAKE SURE YOU:   Understand these instructions.   Will watch your condition.   Will get help right away if you are not doing well or get worse.  Document Released: 02/10/2005 Document Revised: 10/23/2010 Document Reviewed: 08/06/2010 [facility] Patient Information 2012 [place], [place].Stitches, Staples, or Skin Adhesive Strips  Stitches (sutures), staples, and skin adhesive strips hold the skin together as it heals. They will usually be in place for 7 days or less. HOME CARE  Wash your hands with soap and water before and after you touch your wound.   Only take medicine as told by your doctor.   Cover your wound only if your doctor told you to. Otherwise, leave it open to air.   Do not get your stitches wet or dirty. If they get dirty, dab them gently with a clean washcloth. Wet the washcloth with soapy water.  Do not rub. Pat them dry gently.   Do not put medicine or medicated cream on your stitches unless your doctor told you to.   Do not take out your own stitches or staples. Skin adhesive strips will fall off by themselves.   Do not pick at the wound. Picking can cause an infection.   Do not miss your follow-up appointment.   If you have problems or questions, call your doctor.  GET HELP RIGHT AWAY IF:   You have a temperature by mouth above 102 F (38.9 C), not controlled by medicine.  You have chills.   You have redness or pain around your stitches.   There is puffiness (swelling) around your stitches.   You notice fluid (drainage) from your stitches.   There is a bad smell coming from your wound.  MAKE SURE YOU:  Understand these instructions.   Will watch your condition.   Will get help if you are not doing well or get worse.  Document Released: 12/08/2008 Document Revised: 10/23/2010 Document Reviewed: 12/08/2008 [facility] Patient Information 2012 [place], [place].Stitches, Staples, or Skin Adhesive Strips  Stitches (sutures), staples, and skin adhesive strips hold the skin together as it heals. They will usually be in place for 7 days or less. HOME CARE  Wash your hands with soap and water before and after you touch your wound.   Only take medicine as told by your doctor.   Cover your wound only if your doctor told you to. Otherwise, leave it open to air.   Do not get your stitches wet or dirty. If they get dirty, dab them gently with a clean washcloth. Wet the washcloth with soapy water. Do not rub. Pat them dry gently.   Do not put medicine or medicated cream on your stitches unless your doctor told you to.   Do not take out your own stitches or staples. Skin adhesive strips will fall off by themselves.   Do not pick at the wound. Picking can cause an infection.   Do not miss your follow-up appointment.   If you have problems or questions, call your doctor.    GET HELP RIGHT AWAY IF:   You have a temperature by mouth above 102 F (38.9 C), not controlled by medicine.   You have chills.   You have redness or pain around your stitches.   There is puffiness (swelling) around your stitches.   You notice fluid (drainage) from your stitches.   There is a bad smell coming from your wound.  MAKE SURE YOU:  Understand these instructions.   Will watch your condition.   Will get help if you are not doing well or get worse.  Document Released: 12/08/2008 Document Revised: 10/23/2010 Document Reviewed: 12/08/2008 [facility] Patient Information 2012 [place], [place].

## 2011-04-21 NOTE — ED Notes (Signed)
Pt's laceration being sutured.

## 2011-04-21 NOTE — ED Provider Notes (Signed)
Medical screening examination/treatment/procedure(s) were performed by non-physician practitioner and as supervising physician I was immediately available for consultation/collaboration. Darrell Albe, MD, [facility]   Ward Givens, MD 04/21/11 Windell Moment

## 2011-04-21 NOTE — ED Notes (Signed)
Pt struck anterior portion of posterior left left leg with ax head. Bleeding controlled upon presentation. Pt in no acute distress.

## 2011-05-26 DIAGNOSIS — I350 Nonrheumatic aortic (valve) stenosis: Secondary | ICD-10-CM

## 2011-05-26 HISTORY — DX: Nonrheumatic aortic (valve) stenosis: I35.0

## 2011-05-26 HISTORY — PX: NM MYOVIEW LTD: HXRAD82

## 2011-06-24 ENCOUNTER — Encounter (HOSPITAL_COMMUNITY): Payer: Self-pay | Admitting: Pharmacy Technician

## 2011-06-25 HISTORY — PX: CARDIAC CATHETERIZATION: SHX172

## 2011-06-27 ENCOUNTER — Ambulatory Visit
Admission: RE | Admit: 2011-06-27 | Discharge: 2011-06-27 | Disposition: A | Discharge status: Home / Self Care | Payer: BC Managed Care – PPO | Source: Ambulatory Visit | Attending: Cardiology | Admitting: Cardiology

## 2011-06-27 ENCOUNTER — Other Ambulatory Visit: Payer: Self-pay | Admitting: Cardiology

## 2011-06-27 DIAGNOSIS — Z01811 Encounter for preprocedural respiratory examination: Secondary | ICD-10-CM

## 2011-06-27 DIAGNOSIS — R0602 Shortness of breath: Secondary | ICD-10-CM

## 2011-06-27 IMAGING — CR DG CHEST 2V
2 series · 2 of 2 positions shown · non-contrast
Comparison: [DATE]

CLINICAL DATA: Preop respiratory exam.  Chest pain and shortness of
breath

CHEST - 2 VIEW

[w chest pa]
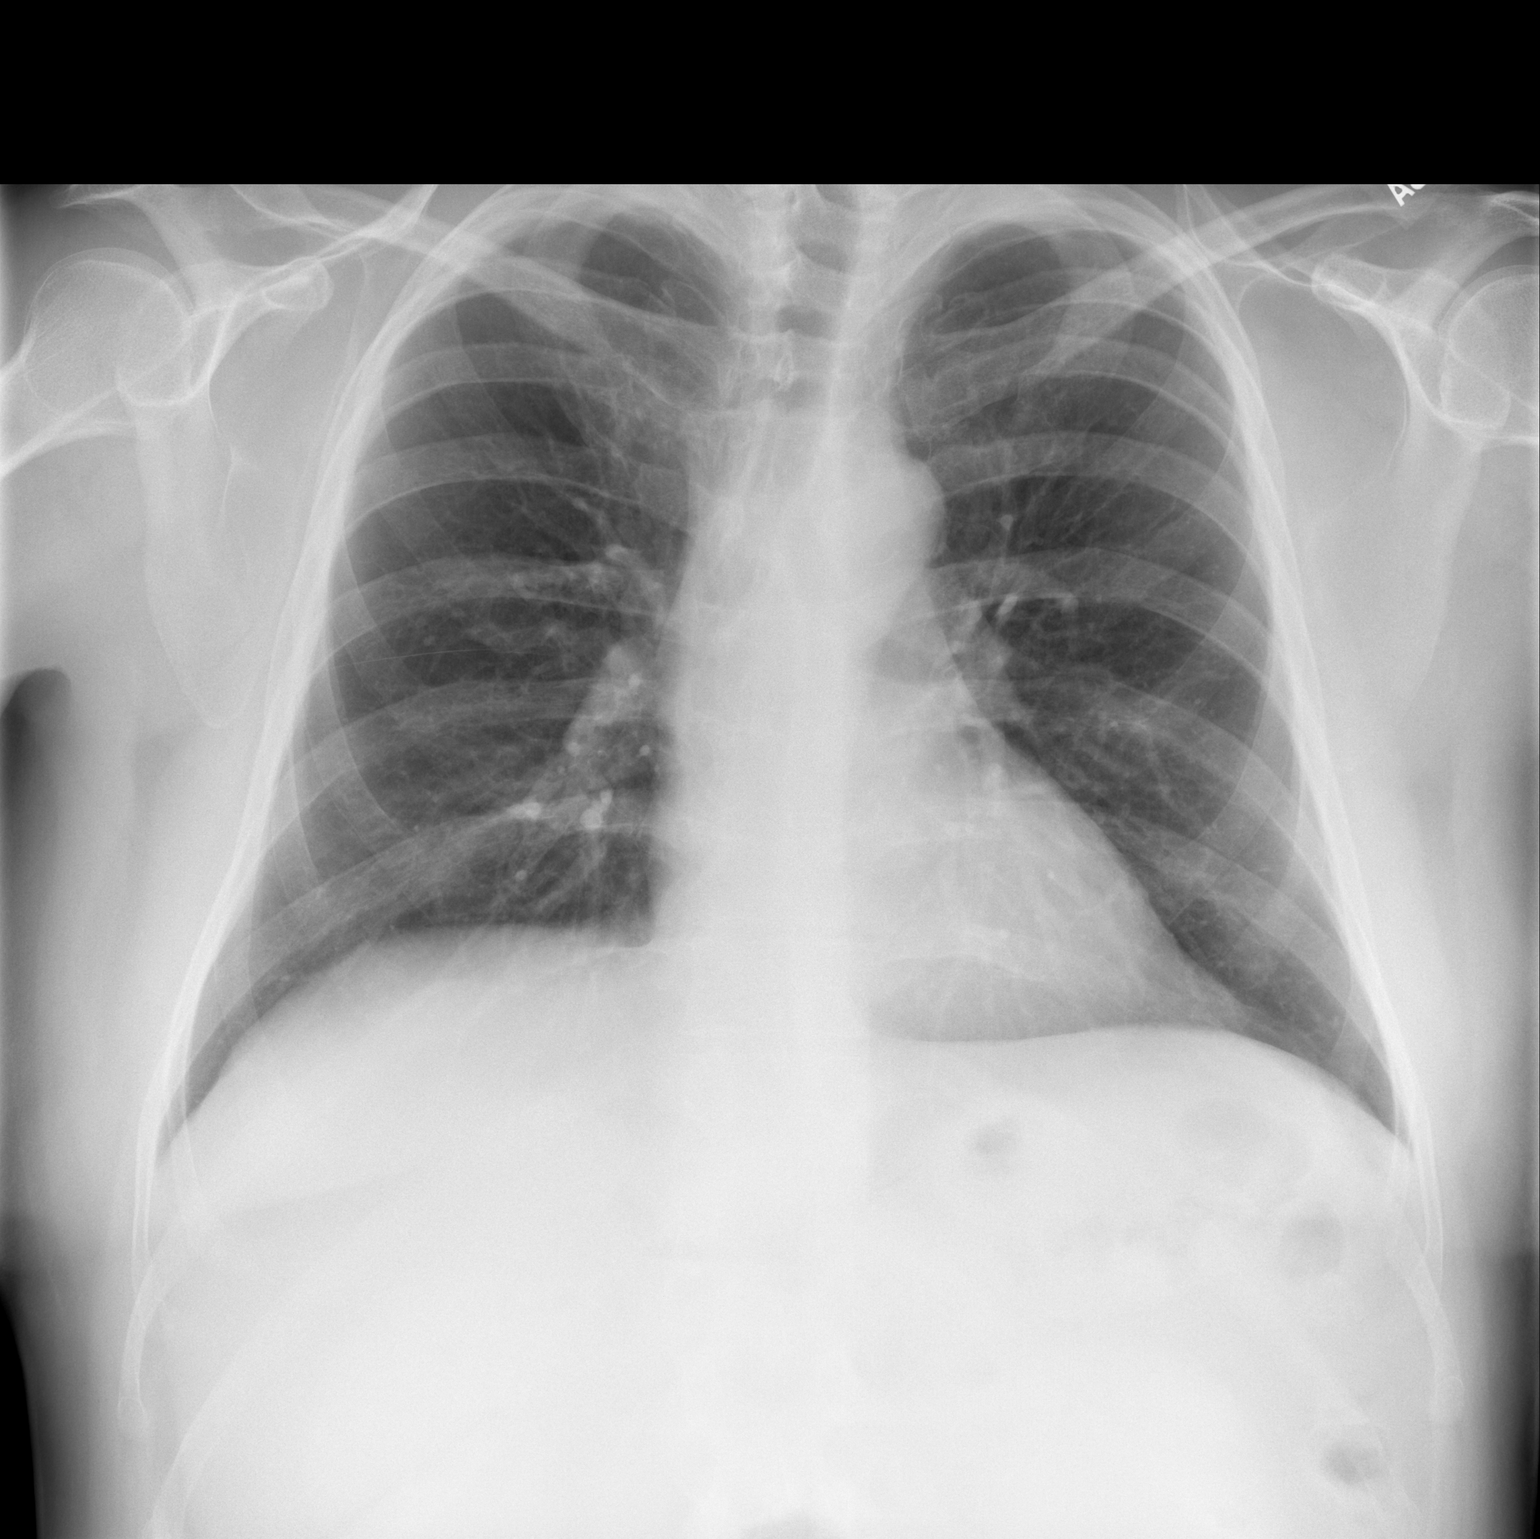

[w chest lat]
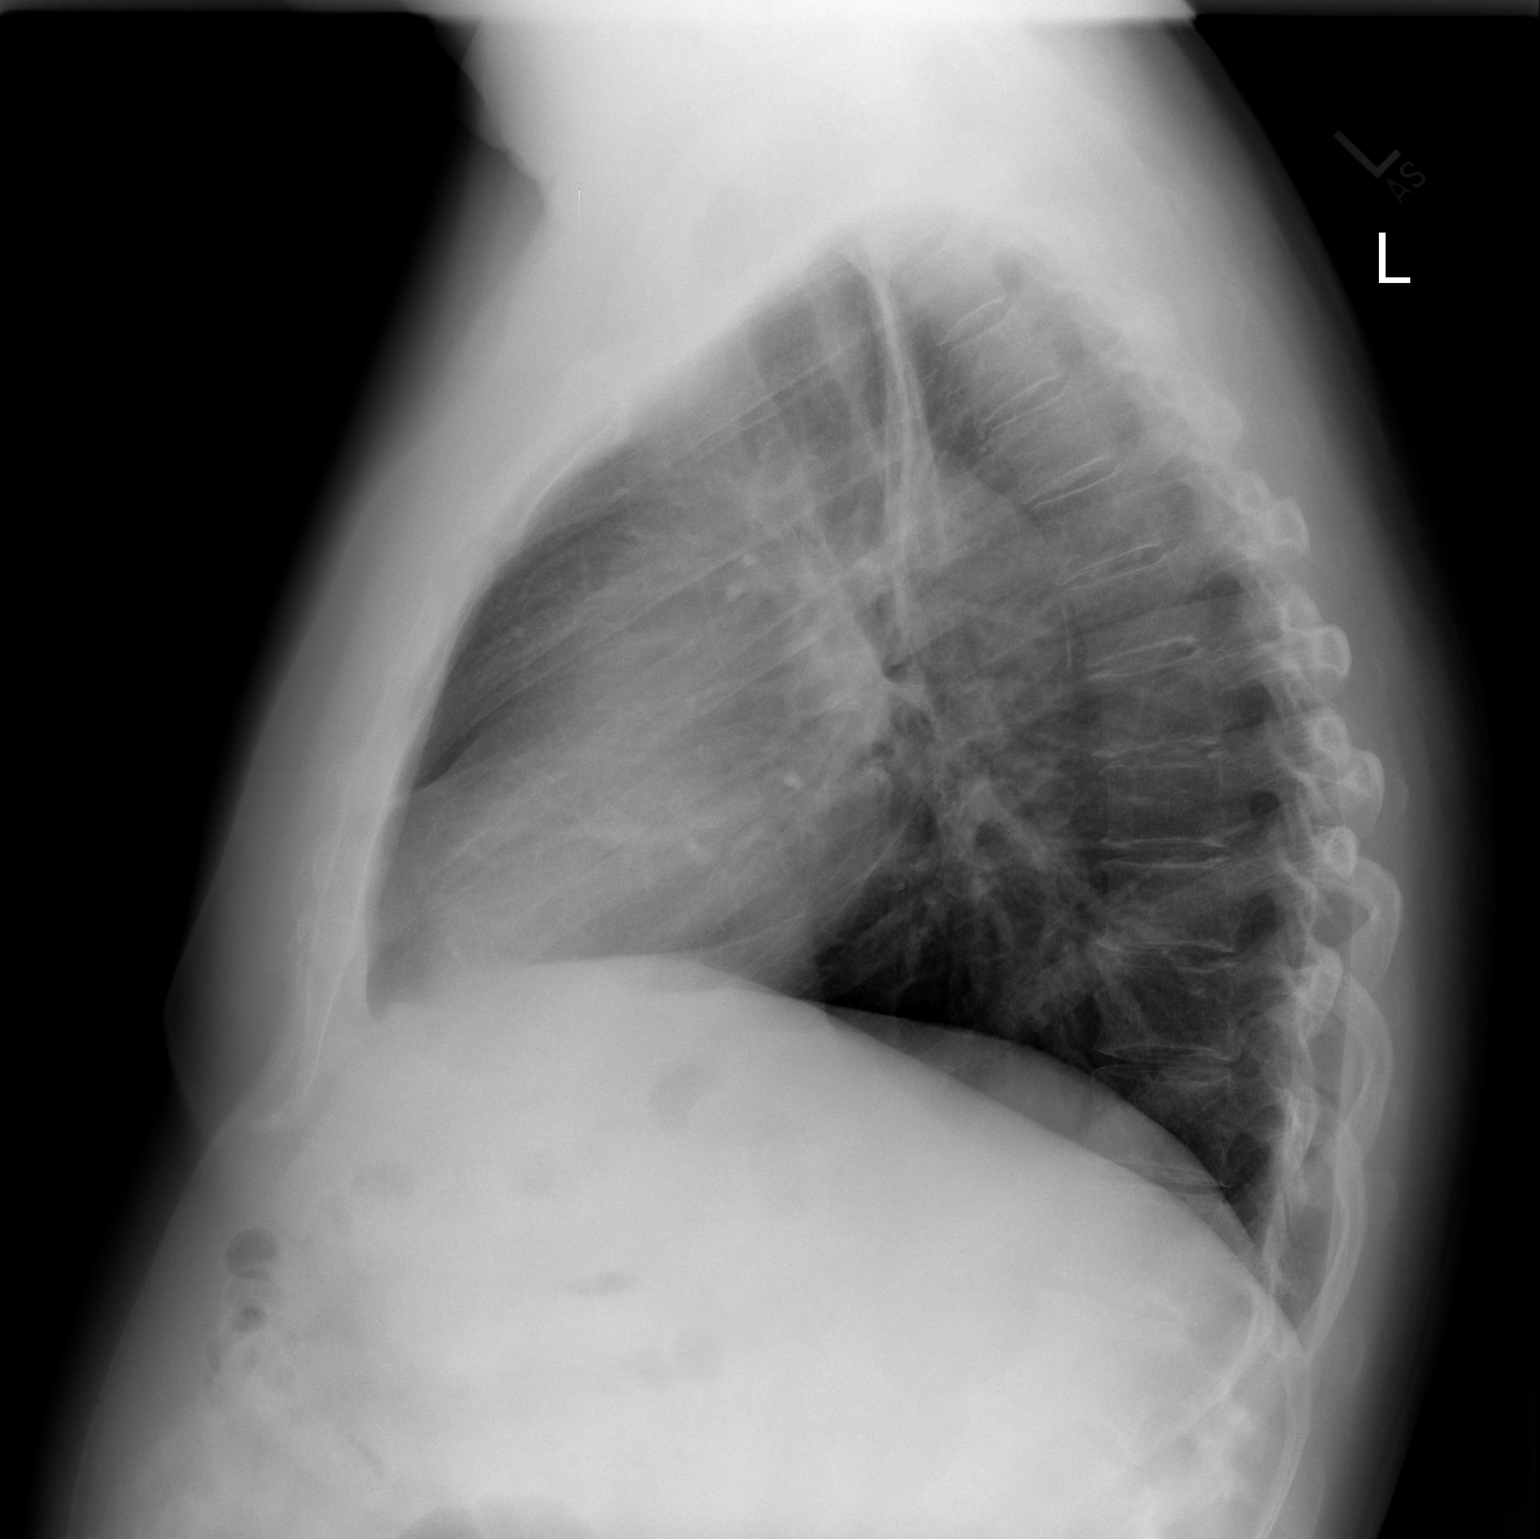

[2 of 2 positions shown; findings below may reference images not displayed]

FINDINGS: The heart size and mediastinal contours are within normal
limits.  Both lungs are clear.  The visualized skeletal structures
are unremarkable.
IMPRESSION: Negative exam.

## 2011-06-30 ENCOUNTER — Other Ambulatory Visit: Payer: Self-pay | Admitting: Cardiology

## 2011-07-03 ENCOUNTER — Ambulatory Visit (HOSPITAL_COMMUNITY)
Admission: RE | Admit: 2011-07-03 | Discharge: 2011-07-03 | Disposition: A | Discharge status: Home / Self Care | Payer: BC Managed Care – PPO | Source: Ambulatory Visit | Attending: Cardiology | Admitting: Cardiology

## 2011-07-03 ENCOUNTER — Encounter (HOSPITAL_COMMUNITY): Payer: Self-pay | Admitting: Cardiology

## 2011-07-03 ENCOUNTER — Encounter (HOSPITAL_COMMUNITY)
Admission: RE | Disposition: A | Discharge status: Home / Self Care | Payer: Self-pay | Source: Ambulatory Visit | Attending: Cardiology

## 2011-07-03 ENCOUNTER — Other Ambulatory Visit: Payer: Self-pay | Admitting: Cardiology

## 2011-07-03 DIAGNOSIS — I10 Essential (primary) hypertension: Secondary | ICD-10-CM | POA: Diagnosis present

## 2011-07-03 DIAGNOSIS — R079 Chest pain, unspecified: Secondary | ICD-10-CM | POA: Diagnosis present

## 2011-07-03 DIAGNOSIS — E782 Mixed hyperlipidemia: Secondary | ICD-10-CM | POA: Diagnosis present

## 2011-07-03 DIAGNOSIS — E785 Hyperlipidemia, unspecified: Secondary | ICD-10-CM | POA: Insufficient documentation

## 2011-07-03 DIAGNOSIS — K219 Gastro-esophageal reflux disease without esophagitis: Secondary | ICD-10-CM | POA: Insufficient documentation

## 2011-07-03 DIAGNOSIS — I359 Nonrheumatic aortic valve disorder, unspecified: Secondary | ICD-10-CM | POA: Insufficient documentation

## 2011-07-03 DIAGNOSIS — E119 Type 2 diabetes mellitus without complications: Secondary | ICD-10-CM | POA: Diagnosis present

## 2011-07-03 HISTORY — PX: LEFT AND RIGHT HEART CATHETERIZATION WITH CORONARY ANGIOGRAM: SHX5449

## 2011-07-03 LAB — POCT I-STAT 3, VENOUS BLOOD GAS (G3P V)
Acid-Base Excess: 1 mmol/L (ref 0.0–2.0)
Bicarbonate: 26.6 mEq/L — ABNORMAL HIGH (ref 20.0–24.0)
Bicarbonate: 28.2 mEq/L — ABNORMAL HIGH (ref 20.0–24.0)
O2 Saturation: 57 %
O2 Saturation: 69 %
O2 Saturation: 75 %
TCO2: 28 mmol/L (ref 0–100)
TCO2: 29 mmol/L (ref 0–100)
TCO2: 30 mmol/L (ref 0–100)
pCO2, Ven: 51.3 mmHg — ABNORMAL HIGH (ref 45.0–50.0)
pH, Ven: 7.341 — ABNORMAL HIGH (ref 7.250–7.300)
pO2, Ven: 33 mmHg (ref 30.0–45.0)
pO2, Ven: 42 mmHg (ref 30.0–45.0)

## 2011-07-03 LAB — POCT I-STAT 3, ART BLOOD GAS (G3+)
O2 Saturation: 92 %
pCO2 arterial: 50.2 mmHg — ABNORMAL HIGH (ref 35.0–45.0)
pCO2 arterial: 46 mmHg — ABNORMAL HIGH (ref 35.0–45.0)
pH, Arterial: 7.37 (ref 7.350–7.450)
pO2, Arterial: 82 mmHg (ref 80.0–100.0)

## 2011-07-03 SURGERY — LEFT AND RIGHT HEART CATHETERIZATION WITH CORONARY ANGIOGRAM
Anesthesia: LOCAL

## 2011-07-03 MED ORDER — OXYCODONE-ACETAMINOPHEN 5-325 MG PO TABS: 1.0000 | ORAL_TABLET | ORAL | Status: DC | PRN

## 2011-07-03 MED ORDER — ONDANSETRON HCL 4 MG/2ML IJ SOLN: 4.0000 mg | Freq: Four times a day (QID) | INTRAMUSCULAR | Status: DC | PRN

## 2011-07-03 MED ORDER — HEPARIN (PORCINE) IN NACL 2-0.9 UNIT/ML-% IJ SOLN
INTRAMUSCULAR | Status: AC
  Filled 2011-07-03: qty 2000

## 2011-07-03 MED ORDER — NITROGLYCERIN 0.2 MG/ML ON CALL CATH LAB
INTRAVENOUS | Status: AC
  Filled 2011-07-03: qty 1

## 2011-07-03 MED ORDER — MIDAZOLAM HCL 2 MG/2ML IJ SOLN
INTRAMUSCULAR | Status: AC
  Filled 2011-07-03: qty 2

## 2011-07-03 MED ORDER — ACETAMINOPHEN 325 MG PO TABS: 650.0000 mg | ORAL_TABLET | ORAL | Status: DC | PRN

## 2011-07-03 MED ORDER — ASPIRIN 81 MG PO CHEW
CHEWABLE_TABLET | ORAL | Status: AC
  Administered 2011-07-03: 324 mg via ORAL
  Filled 2011-07-03: qty 4

## 2011-07-03 MED ORDER — SODIUM CHLORIDE 0.9 % IV SOLN: 250.0000 mL | INTRAVENOUS | Status: DC | PRN

## 2011-07-03 MED ORDER — ASPIRIN 81 MG PO CHEW
324.0000 mg | CHEWABLE_TABLET | ORAL | Status: AC
  Administered 2011-07-03: 324 mg via ORAL

## 2011-07-03 MED ORDER — DIAZEPAM 5 MG PO TABS
ORAL_TABLET | ORAL | Status: AC
  Administered 2011-07-03: 5 mg via ORAL
  Filled 2011-07-03: qty 1

## 2011-07-03 MED ORDER — SODIUM CHLORIDE 0.9 % IV SOLN
INTRAVENOUS | Status: DC
  Administered 2011-07-03: 10:00:00 via INTRAVENOUS

## 2011-07-03 MED ORDER — SODIUM CHLORIDE 0.9 % IJ SOLN: 3.0000 mL | INTRAMUSCULAR | Status: DC | PRN

## 2011-07-03 MED ORDER — LIDOCAINE HCL (PF) 1 % IJ SOLN
INTRAMUSCULAR | Status: AC
  Filled 2011-07-03: qty 30

## 2011-07-03 MED ORDER — PIOGLITAZONE HCL 30 MG PO TABS: 30.0000 mg | ORAL_TABLET | Freq: Every day | ORAL | Status: DC

## 2011-07-03 MED ORDER — SODIUM CHLORIDE 0.9 % IV SOLN: 1.0000 mL/kg/h | INTRAVENOUS | Status: DC

## 2011-07-03 MED ORDER — DIAZEPAM 5 MG PO TABS
5.0000 mg | ORAL_TABLET | Freq: Once | ORAL | Status: AC
  Administered 2011-07-03: 5 mg via ORAL

## 2011-07-03 MED ORDER — SODIUM CHLORIDE 0.9 % IJ SOLN: 3.0000 mL | Freq: Two times a day (BID) | INTRAMUSCULAR | Status: DC

## 2011-07-03 MED ORDER — FENTANYL CITRATE 0.05 MG/ML IJ SOLN
INTRAMUSCULAR | Status: AC
  Filled 2011-07-03: qty 2

## 2011-07-03 NOTE — CV Procedure (Signed)
Darrell [name], Darrell [name] Male, 61 y.o., [birth year]  MRN: [id number]  CARDIAC CATHETERIZATION REPORT   Procedures performed:  1. Right and left heart catheterization  2. Selective coronary angiography     Reason for procedure:  Valvular heart disease: severe aortic stenosis  Procedure performed by: [name], MD, [facility]  Complications: none   Estimated blood loss: less than 5 mL   History:  61 year old with symptomatic AS, prior to surgery referral.  Consent: The risks, benefits, and details of the procedure were explained to the patient. Risks including death, MI, stroke, bleeding, limb ischemia, renal failure and allergy were described and accepted by the patient. Informed written consent was obtained prior to proceeding.  Technique: The patient was brought to the cardiac catheterization laboratory in the fasting state. He was prepped and draped in the usual sterile fashion. Local anesthesia with 1% lidocaine was administered to the right groin area. Using the modified Seldinger technique a 5 French right common femoral artery sheath was introduced without difficulty. Similarly, a 5 French right common femoral vein sheath was placed. Under fluoroscopic guidance, a 59F balloon tipped catheter was advanced to the pulmonary artery and used to record pressures in the right heart chambers and pulmonary artery and the pulmonary wedge position, as well as to obtain blood samples for oxygen saturation. The catheter was left in place in the right atrium during the left heart catheterization. Under fluoroscopic guidance, using 5 [place] JL4 and JR catheters, selective cannulation of the left coronary artery and right coronary artery was respectively performed. Several coronary angiograms in a variety of projections were recorded. The aortic valve could not be crossed.  Contrast used: 60 mL Omnipaque  Angiographic Findings:  1. The left main coronary artery is free of significant atherosclerosis and  bifurcates in the usual fashion into the left anterior descending artery and left circumflex coronary artery.  2. The left anterior descending artery is a large vessel that reaches the apex and generates two major diagonal branches. There is evidence of minimal  luminal irregularities and no calcification. No hemodynamically meaningful stenoses are seen, but there was a smooth 30-40% mid to distal vessel stenosis. 3. The left circumflex coronary artery is a very large-size codominant vessel that generates a very large oblique marginal artery, two medium size posterolateral branches and a small codominant posterior descending artery. There is evidence of mild luminal irregularities and ulceration proximally, but there are no  hemodynamically meaningful stenoses seen. 4. The right coronary artery is a medium-size codominant vessel that generates only a  posterior descending artery. There is evidence of mild luminal irregularities and minimal calcification. No hemodynamically meaningful stenoses are seen.   The aortic valve leaflets are heavily calcified and show markedly reduced mobility by fluoroscopy. The valve could not be easily crossed.    IMPRESSIONS:  Severe calcific aortic stenosis (possibly a bicuspid valve) with a mean echo-derived gradient of 55 mm Hg. No hemodynamically relevant coronary lesions are present. RECOMMENDATION:  Aortic valve replacement.    [name], MD, [facility] [phone] office [phone] pager 07/03/2011 12:57 PM

## 2011-07-03 NOTE — H&P (Signed)
History and Physical Interval Note:  NAME:  COLIN ELLERS   MRN: [id number] DOB:  [birth year]   ADMIT DATE: 07/03/2011   07/03/2011 8:56 AM  Gregary Signs is a 61 y.o. male who I initially saw for evaluation of chest pain earlier this spring.  This was evaluated with a treadmill Myoview stress test as well as an echocardiogram this is prior history of mild to moderate aortic stenosis and worsening murmur.  The Myoview was read as no ischemia however the echocardiographic evaluation of the aortic valve suggested severe aortic stenosis. He continues to have exertional chest pain and shortness of breath therefore referred for cardiac catheterization to evaluate for possible false negative stress test as well as to delineate aortic gradient and right-sided heart pressures.   Past Medical History  Diagnosis Date  . Hyperlipidemia   . Hypertension   . Depression   . Diverticulosis   . GERD (gastroesophageal reflux disease)   . Insomnia   . NIDDM (non-insulin dependent diabetes mellitus)   . Aortic stenosis 05/2011    Severe by recent echo; AoV area ~0.77-79 cm2, peak /mean gradient 86 mmHg/ 55 mmHg.  Marland Kitchen History of BPH    No past surgical history on file.  FAMHx: No family history on file.  SOCHx: Married father of 3, stepfather of 2.  16 grandchildren, 5 great-grandchildren.    does not have a smoking history on file. He does not have any smokeless tobacco history on file. His alcohol and drug histories not on file.  ALLERGIES: Allergies  Allergen Reactions  . Actos (Pioglitazone) Swelling   - Swelling with Actos  HOME MEDICATIONS: Crestor 20mg  qhs, Benicar/HCTZ 20/12.5 mg -- 1/2 tab daily, Bystolic 5 mg daily, ASA 81 mg daily, Metformin 1000 mg bid, Glipizide 10 mg po daily, supplements. Prescriptions prior to admission  Medication Sig Dispense Refill  . ALFALFA PO Take 1 tablet by mouth daily.      Marland Kitchen CINNAMON PO Take 2 capsules by mouth daily.      Marland Kitchen GARLIC PO Take 2 tablets by mouth  daily.      Marland Kitchen HYDROcodone-acetaminophen (NORCO) 5-325 MG per tablet Take 1 tablet by mouth every 8 (eight) hours as needed. pain      . metFORMIN (GLUCOPHAGE) 1000 MG tablet Take 1,000 mg by mouth 2 (two) times daily with a meal.      . pioglitazone (ACTOS) 30 MG tablet Take 30 mg by mouth daily.      Marland Kitchen pyridoxine (B-6) 500 MG tablet Take 500 mg by mouth daily.      . vitamin C (ASCORBIC ACID) 500 MG tablet Take 500 mg by mouth daily.      Marland Kitchen aspirin 81 MG tablet Take 81 mg by mouth daily.        . rosuvastatin (CRESTOR) 20 MG tablet Take 20 mg by mouth daily.        . traMADol (ULTRAM) 50 MG tablet Take 50 mg by mouth every 6 (six) hours as needed. For pain        PHYSICAL EXAM:There were no vitals taken for this visit. General appearance: alert, cooperative, appears stated age, no distress, mildly obese and Pleasant mood and affect Neck: no adenopathy, no carotid bruit, no JVD, supple, symmetrical, trachea midline, thyroid not enlarged, symmetric, no tenderness/mass/nodules and Radiating aortic murmur to right greater than left carotid Lungs: clear to auscultation bilaterally, normal percussion bilaterally and Nonlabored; no wheezes rales or rhonchi Heart: regular rate and rhythm, S1,  S2 normal, no S3 or S4 and systolic murmur: systolic ejection 3/6, crescendo, decrescendo and harsh at 2nd right intercostal space, radiates to carotids Abdomen: soft, non-tender; bowel sounds normal; no masses,  no organomegaly and Mild truncal obesity Extremities: extremities normal, atraumatic, no cyanosis or edema Pulses: 2+ and symmetric Neurologic: Alert and oriented X 3, normal strength and tone. Normal symmetric reflexes. Normal coordination and gait Skin: Skin color, texture, turgor normal. No rashes or lesions  IMPRESSION & PLAN The patients' history has been reviewed, patient examined, no change in status from most recent note, stable for surgery. I have reviewed the patients' chart and labs.  Questions were answered to the patient's satisfaction.    CULLY LUCKOW has presented today for surgery, with the diagnosis of chest pain The various methods of treatment have been discussed with the patient and family.  Risks / Complications include, but not limited to: Death, MI, CVA/TIA, VF/VT (with defibrillation), Bradycardia (need for temporary pacer placement), contrast induced nephropathy, bleeding / bruising / hematoma / pseudoaneurysm, vascular or coronary injury (with possible emergent CT or Vascular Surgery), adverse medication reactions, infection.    After consideration of risks, benefits and other options for treatment, the patient has consented to Procedure(s):   LEFT HEART CATHETERIZATION AND CORONARY ANGIOGRAPHY   RIGHT HEART CATHETERIZATION   as an elective procedure.   We will proceed with the planned procedure.   Layonna Dobie W [facility] [address] [place], [place]  [address]  [phone]  07/03/2011 8:56 AM

## 2011-07-03 NOTE — Discharge Instructions (Signed)

## 2011-07-15 ENCOUNTER — Institutional Professional Consult (permissible substitution) (INDEPENDENT_AMBULATORY_CARE_PROVIDER_SITE_OTHER): Payer: BC Managed Care – PPO | Admitting: Cardiothoracic Surgery

## 2011-07-15 ENCOUNTER — Encounter: Payer: Self-pay | Admitting: Cardiothoracic Surgery

## 2011-07-15 VITALS — BP 131/83 | HR 78 | Resp 20 | Ht 69.0 in | Wt 202.0 lb

## 2011-07-15 DIAGNOSIS — I359 Nonrheumatic aortic valve disorder, unspecified: Secondary | ICD-10-CM
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
## 2011-07-16 NOTE — Patient Instructions (Signed)
Aortic Valve Replacement You have a disease of one of the valves of your heart. In you or your child's case, it is the aortic valve which needs replacing. Aortic valve replacement is open heart surgery done by a heart surgeon. This operation treats problems with the aortic valve. The aortic valve is the "outflow valve" for the left side of the heart. The left side of your heart (left ventricle) is the large muscular part of the heart that pumps blood to the rest of the body. It separates the left ventricle from the aorta. When the heart squeezes down (contracts), the aortic valve is what keeps the blood from flowing back into the ventricle from the aorta. This allows the blood to keep moving through the body.   Surgery may be necessary when the valve does not open or close completely. A stenotic (narrow) valve does not let the blood leave the heart normally. This causes blood to back up in the left ventricle. This makes it hard for the heart to increase the amount of blood that it pumps. The heart has to work harder. This may produce shortness of breath and fatigue. Problems are worse with activity.   If the valve leaflets do not meet correctly when closing, blood may leak backward into the ventricle each time the heart pumps. This is called aortic insufficiency. When some of the blood leaks backwards, the heart has to work even harder. The heart can allow for this over-work for a long time if the leakage came on slowly. Eventually, the heart fails.   Aortic valve problems may be caused by a birth defect. This is called congenital. Wear and tear can cause valves to fail. More commonly, rheumatic fever may damage the aortic valve. Occasionally, the valve may be damaged by infection. This also causes the aortic valve to leak.   DESCRIPTION OF SURGERY Aortic valves can be repaired. When the valve is too damaged to repair, the valve must be replaced. A prosthetic (artificial) valve is used to do this. Valves  damaged by rheumatic disease often must be replaced.   Two types of artificial valves are available:  Mechanical valves made entirely from man-made materials.   Biological valves which are made from animal tissues or taken from a cadaver.  Each has advantages and disadvantages. The choice of which type to use should be made by you and your surgeon. Your risks, age, lifestyle, other medical problems including the decision on whether to be on blood thinners the rest of your life all will help you decide on which type of valve to use. There are a number of good MECHANICAL PROSTHESES available. All work well. The main advantage of mechanical valves is that they do not wear out. Their main disadvantage is that blood clots easier on mechanical valves. If this happens the valve will not work normally. Because of this, patients with mechanical valves must take anticoagulants (blood thinners) for life. There is also a small but definite risk of blood clots causing stroke, even when taking anticoagulants.   There are a number of BIOLOGICAL CHOICES for aortic valve replacement. Most are made from pig aortic valves. Some are taken from cadavers. The main advantage is that they have a reduced risk of blood clots forming on the valve. This lessens the chance of the valve not working or causing a stroke. A large disadvantage of biological or tissue valves is that they wear out sooner than mechanical valves. The rate at which they wear out depends   on the patient's age. A young boy might wear out such a valve in only a few years. The same valve might last 10 years in a middle aged person, and even longer in a patient over the age of 70. A tissue valve used in a person over 70 years old may never need replacement. RISKS AND COMPLICATIONS Your cardiologist and cardiothoracic surgeon can best determine your individual risk. It will depend on your age, general condition, medical conditions, and your heart function. In general,  the risks include:  Problems from the operation itself are low risk. Some common risks are:   Risks from the anesthesia.   Bleeding and infection.   Lifelong treatment with medications to prevent blood clots is needed for mechanical valve replacements.   Infection is more common with valve replacement than with valve repair.   Valve failure is more common with valve replacement than with valve repair. Pig valves tend to fail after about 8 to 10 years.  PROCEDURE   Valve repair or replacement is open-heart surgery. You are given general anesthesia (medications to help you sleep). You are then placed on a heart-lung machine. This machine provides oxygen to your blood while the heart is not working. The surgery generally lasts from 3 to 5 hours. During surgery, the surgeon makes a large incision (cut) in the chest. Sometimes the heart is cooled to slow or stop the heartbeat. The damaged aortic valve is either repaired or removed and replaced with an artificial heart valve. AFTER THE PROCEDURE  Recovery from heart valve surgery usually involves a few days in an intensive care unit (ICU) of a hospital. Full recovery from heart valve surgery can take several months.   Anticoagulation (blood thinning) treatment with warfarin is often prescribed for 6 weeks to 3 months after surgery for those with biological valves. It is prescribed for life for those with mechanical valves.   Recovery includes healing of the surgical incision. There is a gradual building of stamina and exercise abilities. An exercise program under the direction of a physical therapist may be recommended.   Once you have an artificial valve, your heart function and your life will return to normal. You usually feel better after surgery. Shortness of breath and fatigue should lessen. If your heart was already severely damaged before your surgery, you may continue to have problems.   You can usually resume most of your normal  activities. You will have to continue to monitor your condition. You need to watch out for blood clots and infections.   Artificial valves need to be replaced after a period of time. It is important that you see your caregiver regularly.   Some individuals with an aortic valve replacement need to take antibiotics before having dental work or other surgical procedures. This is called prophylactic antibiotic treatment. These drugs help to prevent infective endocarditis. Antibiotics are only recommended for individuals with the highest risk for developing infective endocarditis. Let your dentist and your caregiver know if you have a history of any of the following so that the necessary precautions can be taken:   A VSD.   A repaired VSD.   Endocarditis in the past.   An artificial (prosthetic) heart valve.  HOME CARE INSTRUCTIONS    Use all medications as prescribed.   Take your temperature every morning for the first week after surgery. Record these.   Weigh yourself every morning for at least the first week after surgery and record.   Do not lift   more than 10 pounds (4.5 kg) until your sternum (breastbone) has healed. Avoid all activities which would place strain on your incision.   You may shower but do not take baths until instructed by your caregivers.   Avoid driving for 4 to 6 weeks following surgery or as instructed.   Use your elastic stockings during the day. You should wear the stockings for at least 2 weeks after discharge or longer if your ankles are swollen. The stockings help blood flow and help reduce swelling in the legs. It is easiest to put the stockings on before you get out of bed in the morning. They should fit snugly.  SEEK IMMEDIATE MEDICAL CARE IF:  You develop chest pain which is not coming from your incision (surgical cut) .   You develop shortness of breath.   You develop a temperature over 101 F (38.3 C).   You have a sudden weight gain. Let your  caregiver know what the weight gain is.  Document Released: 07/02/2004 Document Revised: 01/30/2011 Document Reviewed: 02/07/2008 Exit[employer] Patient Information 2012 [employer]. 

## 2011-07-22 ENCOUNTER — Other Ambulatory Visit: Payer: Self-pay | Admitting: Cardiothoracic Surgery

## 2011-07-22 ENCOUNTER — Other Ambulatory Visit: Payer: Self-pay

## 2011-07-22 DIAGNOSIS — I359 Nonrheumatic aortic valve disorder, unspecified: Secondary | ICD-10-CM

## 2011-07-23 ENCOUNTER — Encounter (HOSPITAL_COMMUNITY): Payer: Self-pay | Admitting: Pharmacy Technician

## 2011-07-25 ENCOUNTER — Encounter (HOSPITAL_COMMUNITY): Payer: Self-pay

## 2011-07-25 ENCOUNTER — Inpatient Hospital Stay (HOSPITAL_COMMUNITY): Admission: RE | Admit: 2011-07-25 | Discharge: 2011-07-25 | Payer: BC Managed Care – PPO | Source: Ambulatory Visit

## 2011-07-25 HISTORY — DX: Acute myocardial infarction, unspecified: I21.9

## 2011-07-25 HISTORY — DX: Acute upper respiratory infection, unspecified: J06.9

## 2011-07-25 HISTORY — DX: Unspecified osteoarthritis, unspecified site: M19.90

## 2011-07-25 NOTE — Pre-Procedure Instructions (Signed)
20 [name]  07/25/2011   Your procedure is scheduled on:  08/05/2011  Report to [facility] at 5:30 AM.  Call this number if you have problems the morning of surgery: [phone]   Remember:   Do not eat food:After Midnight.  08/04/2011  May have clear liquids: up to 4 Hours before arrival.  Nothing after 1:30 a.m.   Clear liquids include soda, tea, black coffee, apple or grape juice, broth.  Take these medicines the morning of surgery with A SIP OF WATER: bisoprolol    Do not wear jewelry, make-up or nail polish.  Do not wear lotions, powders, or perfumes. You may wear deodorant.  Do not shave 48 hours prior to surgery. Men may shave face and neck.  Do not bring valuables to the hospital.  Contacts, dentures or bridgework may not be worn into surgery.  Leave suitcase in the car. After surgery it may be brought to your room.  For patients admitted to the hospital, checkout time is 11:00 AM the day of discharge.   Patients discharged the day of surgery will not be allowed to drive home.  Name and phone number of your driver: /w family  Special Instructions: CHG Shower Use Special Wash: 1/2 bottle night before surgery and 1/2 bottle morning of surgery.   Please read over the following fact sheets that you were given: Pain Booklet, Coughing and Deep Breathing, Blood Transfusion Information, Open Heart Packet, MRSA Information and Surgical Site Infection Prevention

## 2011-07-25 NOTE — Progress Notes (Signed)
Spoke /w Shonie at [facility]- requested records in prep. For surgery.

## 2011-07-30 ENCOUNTER — Ambulatory Visit (INDEPENDENT_AMBULATORY_CARE_PROVIDER_SITE_OTHER): Payer: BC Managed Care – PPO | Admitting: Physician Assistant

## 2011-07-30 VITALS — BP 157/86 | HR 93 | Temp 97.8°F | Resp 16 | Ht 69.0 in | Wt 202.0 lb

## 2011-07-30 DIAGNOSIS — I359 Nonrheumatic aortic valve disorder, unspecified: Secondary | ICD-10-CM

## 2011-07-30 DIAGNOSIS — I350 Nonrheumatic aortic (valve) stenosis: Secondary | ICD-10-CM

## 2011-07-30 MED ORDER — CEPHALEXIN 500 MG PO CAPS: 500.0000 mg | ORAL_CAPSULE | Freq: Three times a day (TID) | ORAL | Status: AC
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
## 2011-08-01 ENCOUNTER — Encounter (HOSPITAL_COMMUNITY)
Admission: RE | Admit: 2011-08-01 | Discharge: 2011-08-01 | Disposition: A | Discharge status: Home / Self Care | Payer: BC Managed Care – PPO | Source: Ambulatory Visit | Attending: Cardiothoracic Surgery | Admitting: Cardiothoracic Surgery

## 2011-08-01 ENCOUNTER — Ambulatory Visit (HOSPITAL_COMMUNITY)
Admission: RE | Admit: 2011-08-01 | Discharge: 2011-08-01 | Disposition: A | Discharge status: Home / Self Care | Payer: BC Managed Care – PPO | Source: Ambulatory Visit | Attending: Cardiothoracic Surgery | Admitting: Cardiothoracic Surgery

## 2011-08-01 VITALS — BP 127/80 | HR 70 | Temp 97.0°F | Resp 18 | Ht 68.0 in | Wt 204.6 lb

## 2011-08-01 DIAGNOSIS — I359 Nonrheumatic aortic valve disorder, unspecified: Secondary | ICD-10-CM

## 2011-08-01 DIAGNOSIS — Z01810 Encounter for preprocedural cardiovascular examination: Secondary | ICD-10-CM | POA: Insufficient documentation

## 2011-08-01 DIAGNOSIS — E119 Type 2 diabetes mellitus without complications: Secondary | ICD-10-CM | POA: Insufficient documentation

## 2011-08-01 LAB — PULMONARY FUNCTION TEST

## 2011-08-01 LAB — URINALYSIS, ROUTINE W REFLEX MICROSCOPIC
Bilirubin Urine: NEGATIVE
Glucose, UA: NEGATIVE mg/dL
Hgb urine dipstick: NEGATIVE
Ketones, ur: NEGATIVE mg/dL
Leukocytes, UA: NEGATIVE
Nitrite: NEGATIVE
Protein, ur: NEGATIVE mg/dL
Specific Gravity, Urine: 1.015 (ref 1.005–1.030)
Urobilinogen, UA: 0.2 mg/dL (ref 0.0–1.0)
pH: 5.5 (ref 5.0–8.0)

## 2011-08-01 LAB — ABO/RH: ABO/RH(D): A POS

## 2011-08-01 LAB — COMPREHENSIVE METABOLIC PANEL
ALT: 20 U/L (ref 0–53)
AST: 16 U/L (ref 0–37)
Albumin: 3.9 g/dL (ref 3.5–5.2)
Alkaline Phosphatase: 39 U/L (ref 39–117)
BUN: 14 mg/dL (ref 6–23)
CO2: 19 mEq/L (ref 19–32)
Calcium: 9.2 mg/dL (ref 8.4–10.5)
Chloride: 104 mEq/L (ref 96–112)
Creatinine, Ser: 0.68 mg/dL (ref 0.50–1.35)
GFR calc Af Amer: >90 mL/min (ref >90)
GFR calc non Af Amer: >90 mL/min (ref >90)
Glucose, Bld: 100 mg/dL — ABNORMAL HIGH (ref 70–99)
Potassium: 4 mEq/L (ref 3.5–5.1)
Sodium: 138 mEq/L (ref 135–145)
Total Bilirubin: 0.3 mg/dL (ref 0.3–1.2)
Total Protein: 6.6 g/dL (ref 6.0–8.3)

## 2011-08-01 LAB — SURGICAL PCR SCREEN
MRSA, PCR: NEGATIVE
Staphylococcus aureus: NEGATIVE

## 2011-08-01 LAB — CBC
HCT: 42.2 % (ref 39.0–52.0)
Hemoglobin: 15 g/dL (ref 13.0–17.0)
MCH: 32.8 pg (ref 26.0–34.0)
MCHC: 35.5 g/dL (ref 30.0–36.0)
MCV: 92.1 fL (ref 78.0–100.0)
Platelets: 131 10*3/uL — ABNORMAL LOW (ref 150–400)
RBC: 4.58 MIL/uL (ref 4.22–5.81)
RDW: 12.4 % (ref 11.5–15.5)
WBC: 9.8 10*3/uL (ref 4.0–10.5)

## 2011-08-01 LAB — PROTIME-INR
INR: 1.01 (ref 0.00–1.49)
Prothrombin Time: 13.5 seconds (ref 11.6–15.2)

## 2011-08-01 LAB — APTT: aPTT: 28 seconds (ref 24–37)

## 2011-08-01 LAB — TYPE AND SCREEN
ABO/RH(D): A POS
Antibody Screen: NEGATIVE

## 2011-08-01 LAB — HEMOGLOBIN A1C
Hgb A1c MFr Bld: 6 % — ABNORMAL HIGH (ref <5.7)
Mean Plasma Glucose: 126 mg/dL — ABNORMAL HIGH (ref <117)

## 2011-08-01 IMAGING — CR DG CHEST 2V
2 series · 2 of 2 positions shown · non-contrast
Comparison: [DATE]

CLINICAL DATA: Precardiac surgery

CHEST - 2 VIEW

[view not recorded (1 of 2)]
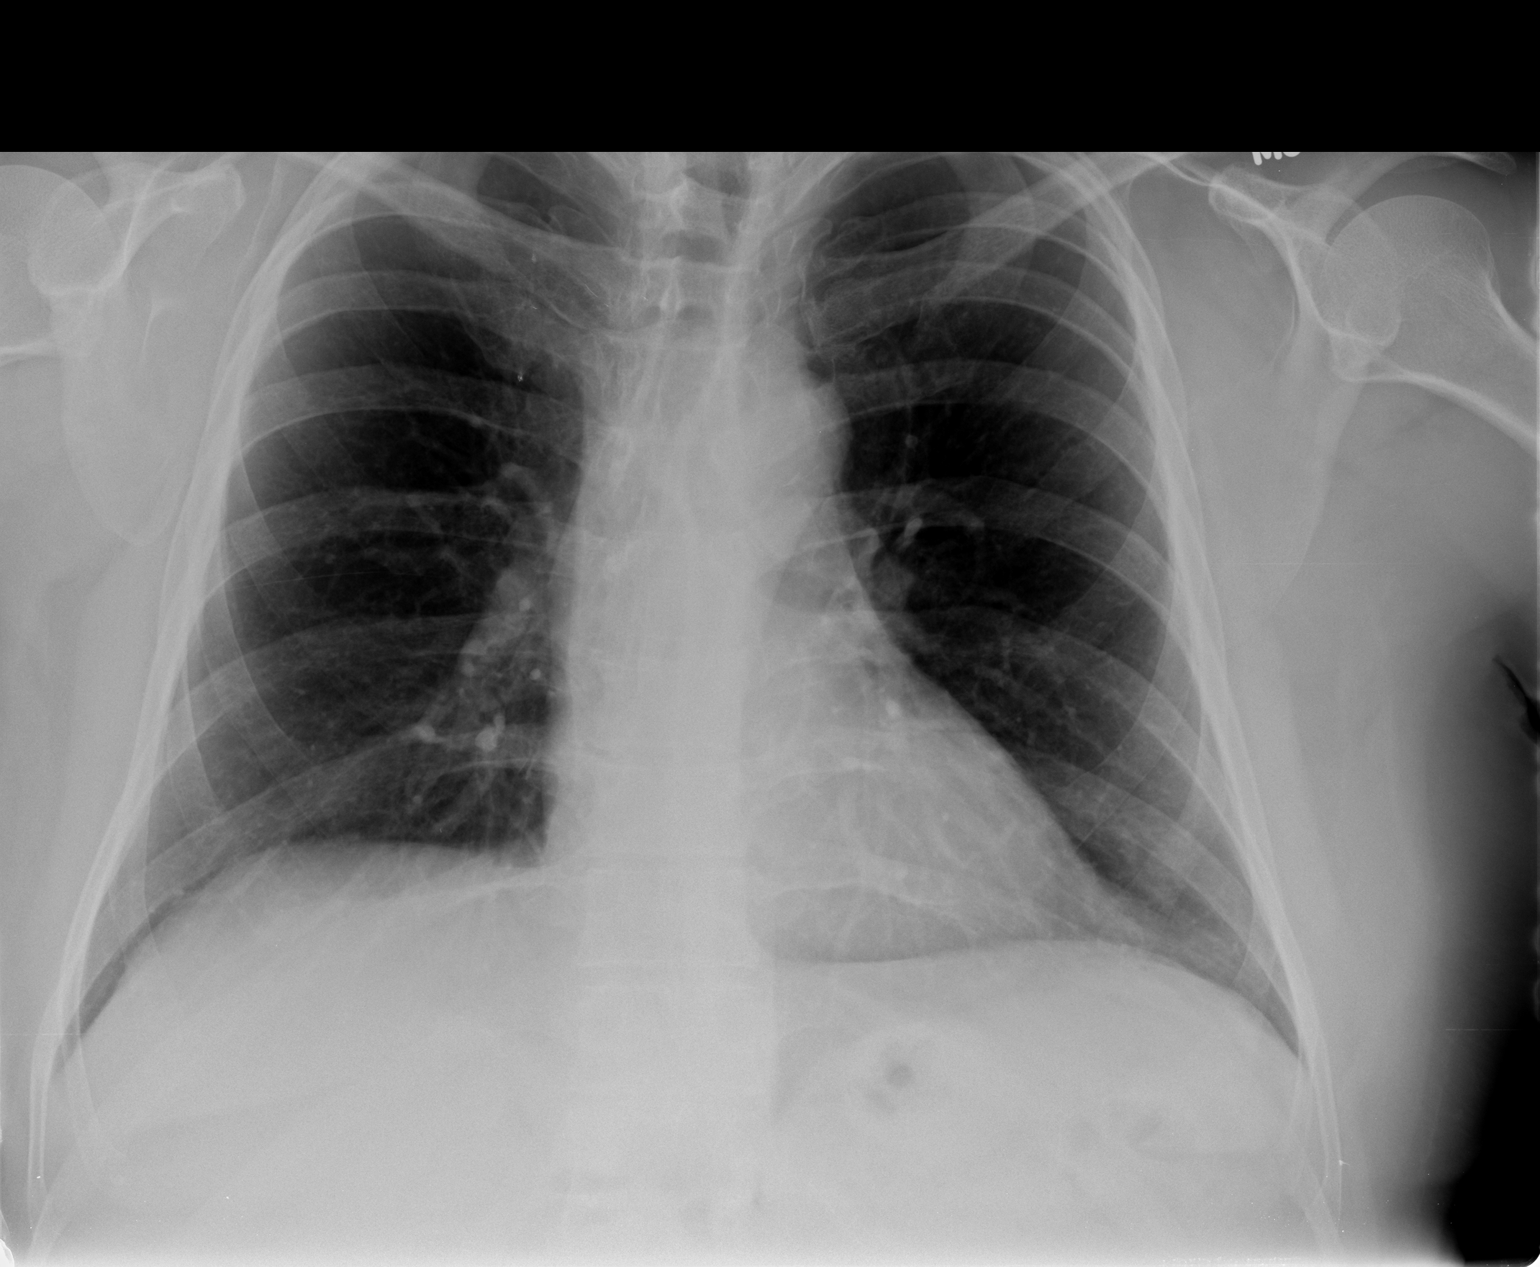

[view not recorded (2 of 2)]
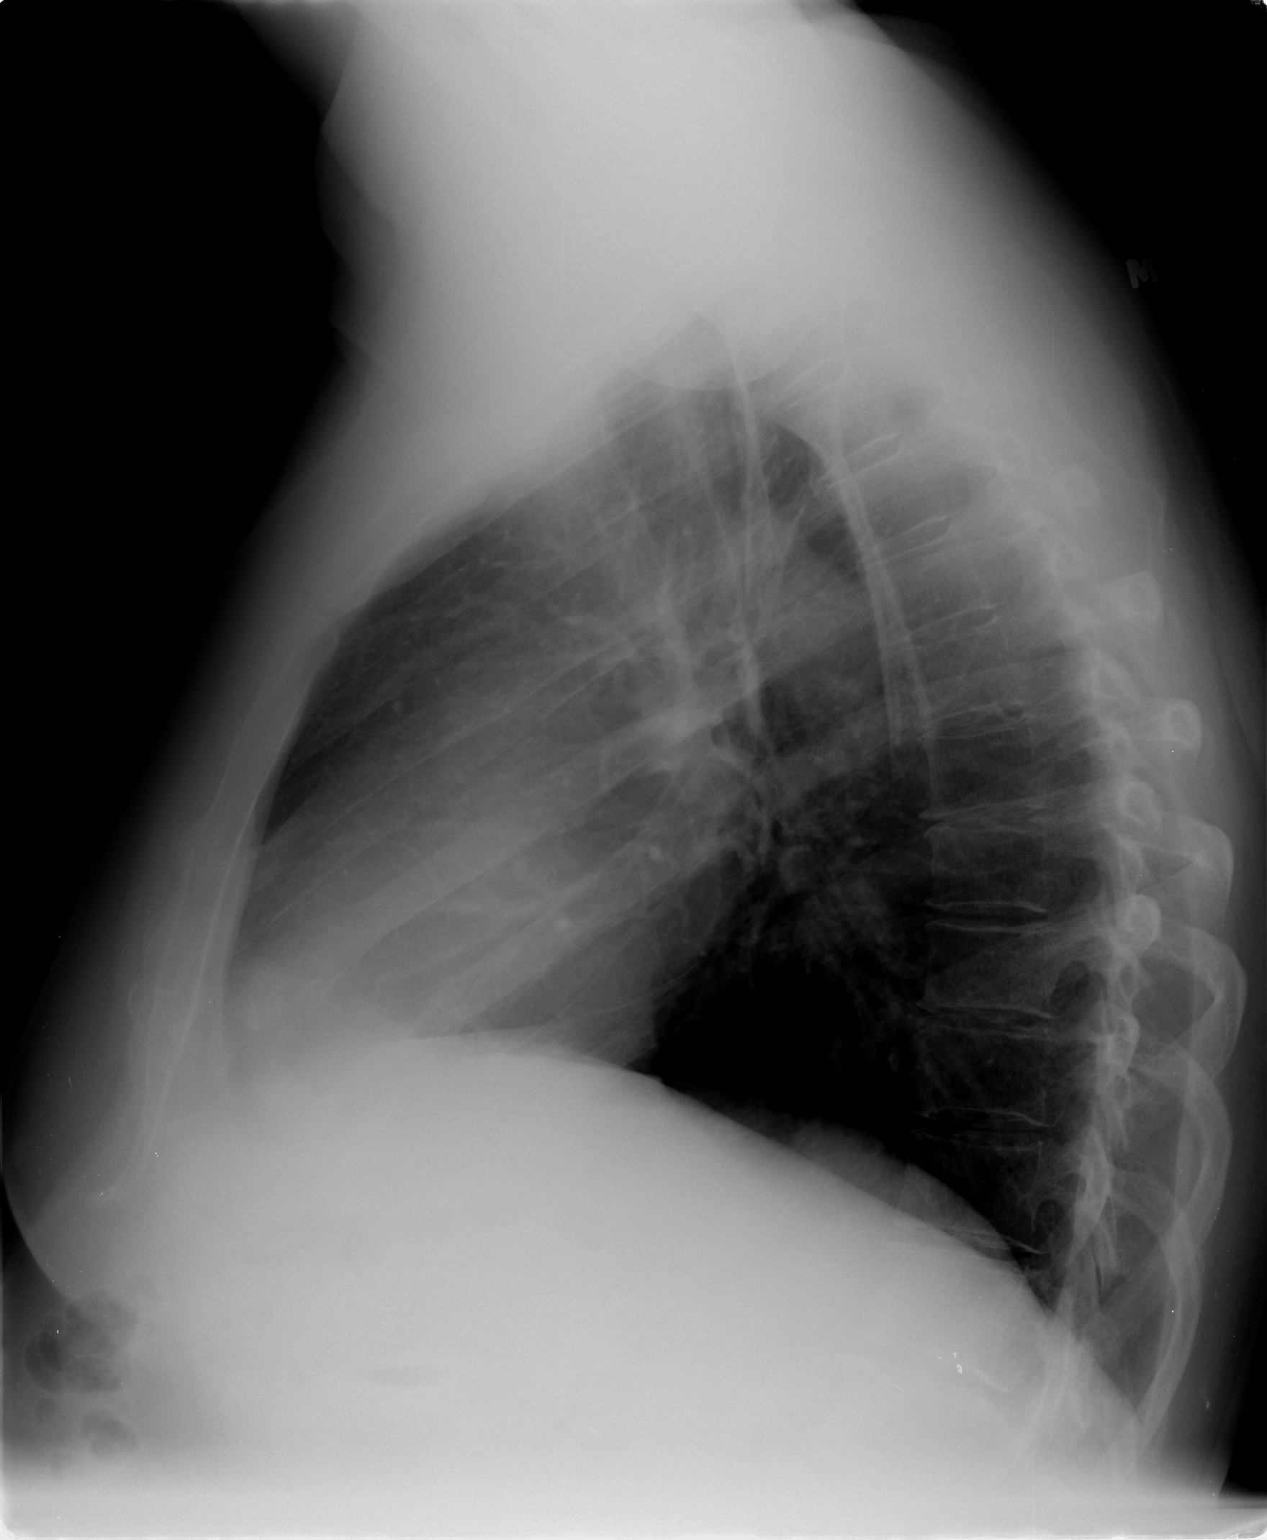

[2 of 2 positions shown; findings below may reference images not displayed]

FINDINGS: The heart size and mediastinal contours are within normal
limits.  Both lungs are clear.  The visualized skeletal structures
are unremarkable.
IMPRESSION: Negative exam

## 2011-08-01 NOTE — Pre-Procedure Instructions (Addendum)
[address]  08/01/2011   Your procedure is scheduled on:  Tuesday August 05, 2011.  Report to [facility] at 0530 AM.  Call this number if you have problems the morning of surgery: [phone]   Remember:   Do not eat food:After Midnight.  May have clear liquids: up to 4 Hours before arrival until 0130 am.  Clear liquids include soda, tea, black coffee, apple or grape juice, broth.  Take these medicines the morning of surgery with A SIP OF WATER: Bisoprolol (Zebeta), and Tramadol (Ultram) if needed for pain.   Do not wear jewelry.  Do not wear lotions or colognes.  Men may shave face and neck.  Do not bring valuables to the hospital.  Contacts, dentures or bridgework may not be worn into surgery.  Leave suitcase in the car. After surgery it may be brought to your room.  For patients admitted to the hospital, checkout time is 11:00 AM the day of discharge.   Patients discharged the day of surgery will not be allowed to drive home.  Name and phone number of your driver:   Special Instructions: CHG Shower Use Special Wash: 1/2 bottle night before surgery and 1/2 bottle morning of surgery.   Please read over the following fact sheets that you were given: Pain Booklet, Coughing and Deep Breathing, Blood Transfusion Information, Open Heart Packet, MRSA Information and Surgical Site Infection Prevention

## 2011-08-01 NOTE — Progress Notes (Signed)
VASCULAR LAB PRELIMINARY  PRELIMINARY  PRELIMINARY  PRELIMINARY  Pre-op Cardiac Surgery  Carotid Findings:  Done at [facility]  Upper Extremity Right Left  Brachial Pressures 120 126  Radial Waveforms triphasic triphasic  Ulnar Waveforms triphasic triphasic  Palmar Arch (Allen's Test) Normal with radial and ulnar Normal with radial and ulnar   Findings:    normal  Lower  Extremity Right Left  Dorsalis Pedis    Anterior Tibial 120  tri  126   tri  Posterior Tibial 146   tri 140  tri  Ankle/Brachial Indices >1.0 .>1.0    Findings:  Normal ABIs and pedal waveforms  Vanna Scotland,   RVT 08/01/2011, 5:29 PM

## 2011-08-04 ENCOUNTER — Encounter: Payer: Self-pay | Admitting: Cardiothoracic Surgery

## 2011-08-04 ENCOUNTER — Ambulatory Visit (INDEPENDENT_AMBULATORY_CARE_PROVIDER_SITE_OTHER): Payer: BC Managed Care – PPO | Admitting: Cardiothoracic Surgery

## 2011-08-04 VITALS — BP 119/71 | HR 78 | Resp 18 | Ht 69.0 in | Wt 202.0 lb

## 2011-08-04 DIAGNOSIS — L03119 Cellulitis of unspecified part of limb: Secondary | ICD-10-CM

## 2011-08-04 DIAGNOSIS — I350 Nonrheumatic aortic (valve) stenosis: Secondary | ICD-10-CM

## 2011-08-04 DIAGNOSIS — L02419 Cutaneous abscess of limb, unspecified: Secondary | ICD-10-CM

## 2011-08-04 DIAGNOSIS — I359 Nonrheumatic aortic valve disorder, unspecified: Secondary | ICD-10-CM

## 2011-08-04 LAB — BLOOD GAS, ARTERIAL
Acid-base deficit: 1.4 mmol/L (ref 0.0–2.0)
Bicarbonate: 22 mEq/L (ref 20.0–24.0)
Drawn by: 181601
FIO2: 0.21 %
O2 Saturation: 98.4 %
Patient temperature: 98.6
TCO2: 23 mmol/L (ref 0–100)
pCO2 arterial: 32.2 mmHg — ABNORMAL LOW (ref 35.0–45.0)
pH, Arterial: 7.45 (ref 7.350–7.450)
pO2, Arterial: 113 mmHg — ABNORMAL HIGH (ref 80.0–100.0)

## 2011-08-04 MED ORDER — MAGNESIUM SULFATE 50 % IJ SOLN
40.0000 meq | INTRAMUSCULAR | Status: DC
  Filled 2011-08-04: qty 10

## 2011-08-04 MED ORDER — SODIUM BICARBONATE 8.4 % IV SOLN
INTRAVENOUS | Status: AC
  Administered 2011-08-05: 09:00:00
  Filled 2011-08-04: qty 2.5

## 2011-08-04 MED ORDER — DOPAMINE-DEXTROSE 3.2-5 MG/ML-% IV SOLN
2.0000 ug/kg/min | INTRAVENOUS | Status: DC
  Filled 2011-08-04: qty 250

## 2011-08-04 MED ORDER — POTASSIUM CHLORIDE 2 MEQ/ML IV SOLN
80.0000 meq | INTRAVENOUS | Status: DC
  Filled 2011-08-04: qty 40

## 2011-08-04 MED ORDER — NITROGLYCERIN IN D5W 200-5 MCG/ML-% IV SOLN
2.0000 ug/min | INTRAVENOUS | Status: DC
  Filled 2011-08-04: qty 250

## 2011-08-04 MED ORDER — SODIUM CHLORIDE 0.9 % IV SOLN
INTRAVENOUS | Status: AC
  Administered 2011-08-05: 2.7 [IU]/h via INTRAVENOUS
  Filled 2011-08-04: qty 1

## 2011-08-04 MED ORDER — DEXMEDETOMIDINE HCL 100 MCG/ML IV SOLN
0.1000 ug/kg/h | INTRAVENOUS | Status: AC
  Administered 2011-08-05: .2 ug/kg/h via INTRAVENOUS
  Filled 2011-08-04: qty 4

## 2011-08-04 MED ORDER — METOPROLOL TARTRATE 12.5 MG HALF TABLET: 12.5000 mg | ORAL_TABLET | Freq: Once | ORAL | Status: DC

## 2011-08-04 MED ORDER — EPINEPHRINE HCL 1 MG/ML IJ SOLN
0.5000 ug/min | INTRAVENOUS | Status: DC
  Filled 2011-08-04: qty 4

## 2011-08-04 MED ORDER — DEXTROSE 5 % IV SOLN
750.0000 mg | INTRAVENOUS | Status: DC
  Filled 2011-08-04: qty 750

## 2011-08-04 MED ORDER — DEXTROSE 5 % IV SOLN
1.5000 g | INTRAVENOUS | Status: AC
  Administered 2011-08-05: 750 g via INTRAVENOUS
  Administered 2011-08-05: 1.5 g via INTRAVENOUS
  Filled 2011-08-04: qty 1.5

## 2011-08-04 MED ORDER — TRANEXAMIC ACID (OHS) PUMP PRIME SOLUTION
2.0000 mg/kg | INTRAVENOUS | Status: DC
  Filled 2011-08-04: qty 1.83

## 2011-08-04 MED ORDER — CHLORHEXIDINE GLUCONATE 4 % EX LIQD: 30.0000 mL | CUTANEOUS | Status: DC

## 2011-08-04 MED ORDER — TRANEXAMIC ACID (OHS) BOLUS VIA INFUSION
15.0000 mg/kg | INTRAVENOUS | Status: AC
  Administered 2011-08-05: 1374 mg via INTRAVENOUS
  Filled 2011-08-04: qty 1374

## 2011-08-04 MED ORDER — SODIUM CHLORIDE 0.9 % IV SOLN
1500.0000 mg | INTRAVENOUS | Status: AC
  Administered 2011-08-05: 1500 mg via INTRAVENOUS
  Filled 2011-08-04: qty 1500

## 2011-08-04 MED ORDER — TRANEXAMIC ACID 100 MG/ML IV SOLN
1.5000 mg/kg/h | INTRAVENOUS | Status: AC
  Administered 2011-08-05: 1.5 mg/kg/h via INTRAVENOUS
  Filled 2011-08-04: qty 25

## 2011-08-04 MED ORDER — PHENYLEPHRINE HCL 10 MG/ML IJ SOLN
30.0000 ug/min | INTRAVENOUS | Status: AC
  Administered 2011-08-05: 20 ug/min via INTRAVENOUS
  Filled 2011-08-04: qty 2
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
## 2011-08-05 ENCOUNTER — Encounter (HOSPITAL_COMMUNITY)
Admission: RE | Disposition: A | Discharge status: Home / Self Care | Payer: Self-pay | Source: Ambulatory Visit | Attending: Cardiothoracic Surgery

## 2011-08-05 ENCOUNTER — Encounter (HOSPITAL_COMMUNITY): Payer: Self-pay | Admitting: Vascular Surgery

## 2011-08-05 ENCOUNTER — Encounter (HOSPITAL_COMMUNITY): Payer: Self-pay

## 2011-08-05 ENCOUNTER — Inpatient Hospital Stay (HOSPITAL_COMMUNITY): Payer: BC Managed Care – PPO

## 2011-08-05 ENCOUNTER — Ambulatory Visit (HOSPITAL_COMMUNITY): Payer: BC Managed Care – PPO | Admitting: Vascular Surgery

## 2011-08-05 ENCOUNTER — Inpatient Hospital Stay (HOSPITAL_COMMUNITY)
Admission: RE | Admit: 2011-08-05 | Discharge: 2011-08-11 | DRG: 105 | Disposition: A | Discharge status: Home / Self Care | Payer: BC Managed Care – PPO | Source: Ambulatory Visit | Attending: Cardiothoracic Surgery | Admitting: Cardiothoracic Surgery

## 2011-08-05 DIAGNOSIS — F3289 Other specified depressive episodes: Secondary | ICD-10-CM | POA: Diagnosis present

## 2011-08-05 DIAGNOSIS — I350 Nonrheumatic aortic (valve) stenosis: Secondary | ICD-10-CM

## 2011-08-05 DIAGNOSIS — E119 Type 2 diabetes mellitus without complications: Secondary | ICD-10-CM | POA: Diagnosis present

## 2011-08-05 DIAGNOSIS — D62 Acute posthemorrhagic anemia: Secondary | ICD-10-CM | POA: Diagnosis not present

## 2011-08-05 DIAGNOSIS — M549 Dorsalgia, unspecified: Secondary | ICD-10-CM | POA: Diagnosis present

## 2011-08-05 DIAGNOSIS — F329 Major depressive disorder, single episode, unspecified: Secondary | ICD-10-CM | POA: Diagnosis present

## 2011-08-05 DIAGNOSIS — G4700 Insomnia, unspecified: Secondary | ICD-10-CM | POA: Diagnosis present

## 2011-08-05 DIAGNOSIS — D696 Thrombocytopenia, unspecified: Secondary | ICD-10-CM | POA: Diagnosis present

## 2011-08-05 DIAGNOSIS — G8929 Other chronic pain: Secondary | ICD-10-CM | POA: Diagnosis present

## 2011-08-05 DIAGNOSIS — M129 Arthropathy, unspecified: Secondary | ICD-10-CM | POA: Diagnosis present

## 2011-08-05 DIAGNOSIS — K219 Gastro-esophageal reflux disease without esophagitis: Secondary | ICD-10-CM | POA: Diagnosis present

## 2011-08-05 DIAGNOSIS — I359 Nonrheumatic aortic valve disorder, unspecified: Principal | ICD-10-CM | POA: Diagnosis present

## 2011-08-05 DIAGNOSIS — E785 Hyperlipidemia, unspecified: Secondary | ICD-10-CM | POA: Diagnosis present

## 2011-08-05 DIAGNOSIS — I252 Old myocardial infarction: Secondary | ICD-10-CM

## 2011-08-05 DIAGNOSIS — K5730 Diverticulosis of large intestine without perforation or abscess without bleeding: Secondary | ICD-10-CM | POA: Diagnosis present

## 2011-08-05 DIAGNOSIS — N400 Enlarged prostate without lower urinary tract symptoms: Secondary | ICD-10-CM | POA: Diagnosis present

## 2011-08-05 DIAGNOSIS — I10 Essential (primary) hypertension: Secondary | ICD-10-CM | POA: Diagnosis present

## 2011-08-05 DIAGNOSIS — Z952 Presence of prosthetic heart valve: Secondary | ICD-10-CM

## 2011-08-05 DIAGNOSIS — I4891 Unspecified atrial fibrillation: Secondary | ICD-10-CM | POA: Diagnosis not present

## 2011-08-05 DIAGNOSIS — Z79899 Other long term (current) drug therapy: Secondary | ICD-10-CM

## 2011-08-05 DIAGNOSIS — Z8249 Family history of ischemic heart disease and other diseases of the circulatory system: Secondary | ICD-10-CM

## 2011-08-05 HISTORY — PX: AORTIC VALVE REPLACEMENT: SHX41

## 2011-08-05 HISTORY — DX: Presence of prosthetic heart valve: Z95.2

## 2011-08-05 LAB — CBC
HCT: 31.9 % — ABNORMAL LOW (ref 39.0–52.0)
Hemoglobin: 10.9 g/dL — ABNORMAL LOW (ref 13.0–17.0)
Hemoglobin: 11.3 g/dL — ABNORMAL LOW (ref 13.0–17.0)
MCH: 32.5 pg (ref 26.0–34.0)
MCHC: 35.5 g/dL (ref 30.0–36.0)
MCHC: 35.4 g/dL (ref 30.0–36.0)
MCV: 92.2 fL (ref 78.0–100.0)
MCV: 91.7 fL (ref 78.0–100.0)
Platelets: 71 10*3/uL — ABNORMAL LOW (ref 150–400)
Platelets: 73 10*3/uL — ABNORMAL LOW (ref 150–400)
RBC: 3.33 MIL/uL — ABNORMAL LOW (ref 4.22–5.81)
RBC: 3.48 MIL/uL — ABNORMAL LOW (ref 4.22–5.81)
RDW: 12.3 % (ref 11.5–15.5)
RDW: 12.4 % (ref 11.5–15.5)
WBC: 10.3 10*3/uL (ref 4.0–10.5)

## 2011-08-05 LAB — POCT I-STAT 3, ART BLOOD GAS (G3+)
Acid-Base Excess: 1 mmol/L (ref 0.0–2.0)
Acid-Base Excess: 1 mmol/L (ref 0.0–2.0)
Acid-base deficit: 2 mmol/L (ref 0.0–2.0)
Acid-base deficit: 1 mmol/L (ref 0.0–2.0)
Acid-base deficit: 3 mmol/L — ABNORMAL HIGH (ref 0.0–2.0)
Bicarbonate: 25.5 mEq/L — ABNORMAL HIGH (ref 20.0–24.0)
Bicarbonate: 25.7 meq/L — ABNORMAL HIGH (ref 20.0–24.0)
Bicarbonate: 23 mEq/L (ref 20.0–24.0)
Bicarbonate: 21.2 mEq/L (ref 20.0–24.0)
Bicarbonate: 22.2 mEq/L (ref 20.0–24.0)
O2 Saturation: 100 %
O2 Saturation: 100 %
O2 Saturation: 99 %
O2 Saturation: 99 %
O2 Saturation: 98 %
Patient temperature: 36.1
Patient temperature: 37.2
Patient temperature: 37.7
TCO2: 27 mmol/L (ref 0–100)
TCO2: 27 mmol/L (ref 0–100)
TCO2: 24 mmol/L (ref 0–100)
TCO2: 22 mmol/L (ref 0–100)
TCO2: 23 mmol/L (ref 0–100)
pCO2 arterial: 41.1 mmHg (ref 35.0–45.0)
pCO2 arterial: 42.1 mmHg (ref 35.0–45.0)
pCO2 arterial: 36.2 mmHg (ref 35.0–45.0)
pCO2 arterial: 27.7 mmHg — ABNORMAL LOW (ref 35.0–45.0)
pCO2 arterial: 38.2 mmHg (ref 35.0–45.0)
pH, Arterial: 7.401 (ref 7.350–7.450)
pH, Arterial: 7.393 (ref 7.350–7.450)
pH, Arterial: 7.407 (ref 7.350–7.450)
pH, Arterial: 7.493 — ABNORMAL HIGH (ref 7.350–7.450)
pH, Arterial: 7.375 (ref 7.350–7.450)
pO2, Arterial: 331 mmHg — ABNORMAL HIGH (ref 80.0–100.0)
pO2, Arterial: 305 mmHg — ABNORMAL HIGH (ref 80.0–100.0)
pO2, Arterial: 122 mmHg — ABNORMAL HIGH (ref 80.0–100.0)
pO2, Arterial: 136 mmHg — ABNORMAL HIGH (ref 80.0–100.0)
pO2, Arterial: 107 mmHg — ABNORMAL HIGH (ref 80.0–100.0)

## 2011-08-05 LAB — POCT I-STAT 4, (NA,K, GLUC, HGB,HCT)
Glucose, Bld: 149 mg/dL — ABNORMAL HIGH (ref 70–99)
Glucose, Bld: 105 mg/dL — ABNORMAL HIGH (ref 70–99)
Glucose, Bld: 128 mg/dL — ABNORMAL HIGH (ref 70–99)
Glucose, Bld: 134 mg/dL — ABNORMAL HIGH (ref 70–99)
Glucose, Bld: 172 mg/dL — ABNORMAL HIGH (ref 70–99)
Glucose, Bld: 142 mg/dL — ABNORMAL HIGH (ref 70–99)
HCT: 39 % (ref 39.0–52.0)
HCT: 27 % — ABNORMAL LOW (ref 39.0–52.0)
HCT: 35 % — ABNORMAL LOW (ref 39.0–52.0)
HCT: 29 % — ABNORMAL LOW (ref 39.0–52.0)
HCT: 26 % — ABNORMAL LOW (ref 39.0–52.0)
HCT: 33 % — ABNORMAL LOW (ref 39.0–52.0)
Hemoglobin: 13.3 g/dL (ref 13.0–17.0)
Hemoglobin: 11.9 g/dL — ABNORMAL LOW (ref 13.0–17.0)
Hemoglobin: 9.2 g/dL — ABNORMAL LOW (ref 13.0–17.0)
Hemoglobin: 9.9 g/dL — ABNORMAL LOW (ref 13.0–17.0)
Hemoglobin: 8.8 g/dL — ABNORMAL LOW (ref 13.0–17.0)
Hemoglobin: 11.2 g/dL — ABNORMAL LOW (ref 13.0–17.0)
Potassium: 4.2 mEq/L (ref 3.5–5.1)
Potassium: 4 mEq/L (ref 3.5–5.1)
Potassium: 4.8 mEq/L (ref 3.5–5.1)
Potassium: 4.4 mEq/L (ref 3.5–5.1)
Potassium: 4.4 mEq/L (ref 3.5–5.1)
Potassium: 4 meq/L (ref 3.5–5.1)
Sodium: 139 mEq/L (ref 135–145)
Sodium: 134 mEq/L — ABNORMAL LOW (ref 135–145)
Sodium: 138 mEq/L (ref 135–145)
Sodium: 137 mEq/L (ref 135–145)
Sodium: 138 mEq/L (ref 135–145)
Sodium: 141 meq/L (ref 135–145)

## 2011-08-05 LAB — CREATININE, SERUM
Creatinine, Ser: 0.34 mg/dL — ABNORMAL LOW (ref 0.50–1.35)
GFR calc Af Amer: >90 mL/min (ref >90)
GFR calc non Af Amer: >90 mL/min (ref >90)

## 2011-08-05 LAB — POCT I-STAT, CHEM 8
BUN: 13 mg/dL (ref 6–23)
Calcium, Ion: 1.09 mmol/L — ABNORMAL LOW (ref 1.12–1.32)
Chloride: 107 mEq/L (ref 96–112)
Creatinine, Ser: 0.7 mg/dL (ref 0.50–1.35)
Glucose, Bld: 148 mg/dL — ABNORMAL HIGH (ref 70–99)
HCT: 32 % — ABNORMAL LOW (ref 39.0–52.0)
Hemoglobin: 10.9 g/dL — ABNORMAL LOW (ref 13.0–17.0)
Potassium: 4.1 mEq/L (ref 3.5–5.1)
Sodium: 141 mEq/L (ref 135–145)
TCO2: 22 mmol/L (ref 0–100)

## 2011-08-05 LAB — HEMOGLOBIN AND HEMATOCRIT, BLOOD
HCT: 29.4 % — ABNORMAL LOW (ref 39.0–52.0)
Hemoglobin: 10.6 g/dL — ABNORMAL LOW (ref 13.0–17.0)

## 2011-08-05 LAB — GLUCOSE, CAPILLARY
Glucose-Capillary: 167 mg/dL — ABNORMAL HIGH (ref 70–99)
Glucose-Capillary: 109 mg/dL — ABNORMAL HIGH (ref 70–99)
Glucose-Capillary: 118 mg/dL — ABNORMAL HIGH (ref 70–99)
Glucose-Capillary: 106 mg/dL — ABNORMAL HIGH (ref 70–99)
Glucose-Capillary: 102 mg/dL — ABNORMAL HIGH (ref 70–99)
Glucose-Capillary: 143 mg/dL — ABNORMAL HIGH (ref 70–99)
Glucose-Capillary: 87 mg/dL (ref 70–99)

## 2011-08-05 LAB — PLATELET COUNT: Platelets: 97 10*3/uL — ABNORMAL LOW (ref 150–400)

## 2011-08-05 LAB — APTT: aPTT: 39 s — ABNORMAL HIGH (ref 24–37)

## 2011-08-05 LAB — MAGNESIUM: Magnesium: 1.3 mg/dL — ABNORMAL LOW (ref 1.5–2.5)

## 2011-08-05 IMAGING — CR DG CHEST 1V PORT
1 series · 1 of 1 positions shown · non-contrast
Comparison: [DATE]

CLINICAL DATA: Postop from aortic valve replacement.  Previous
myocardial infarct and hypertension.

PORTABLE CHEST - 1 VIEW

[AP]
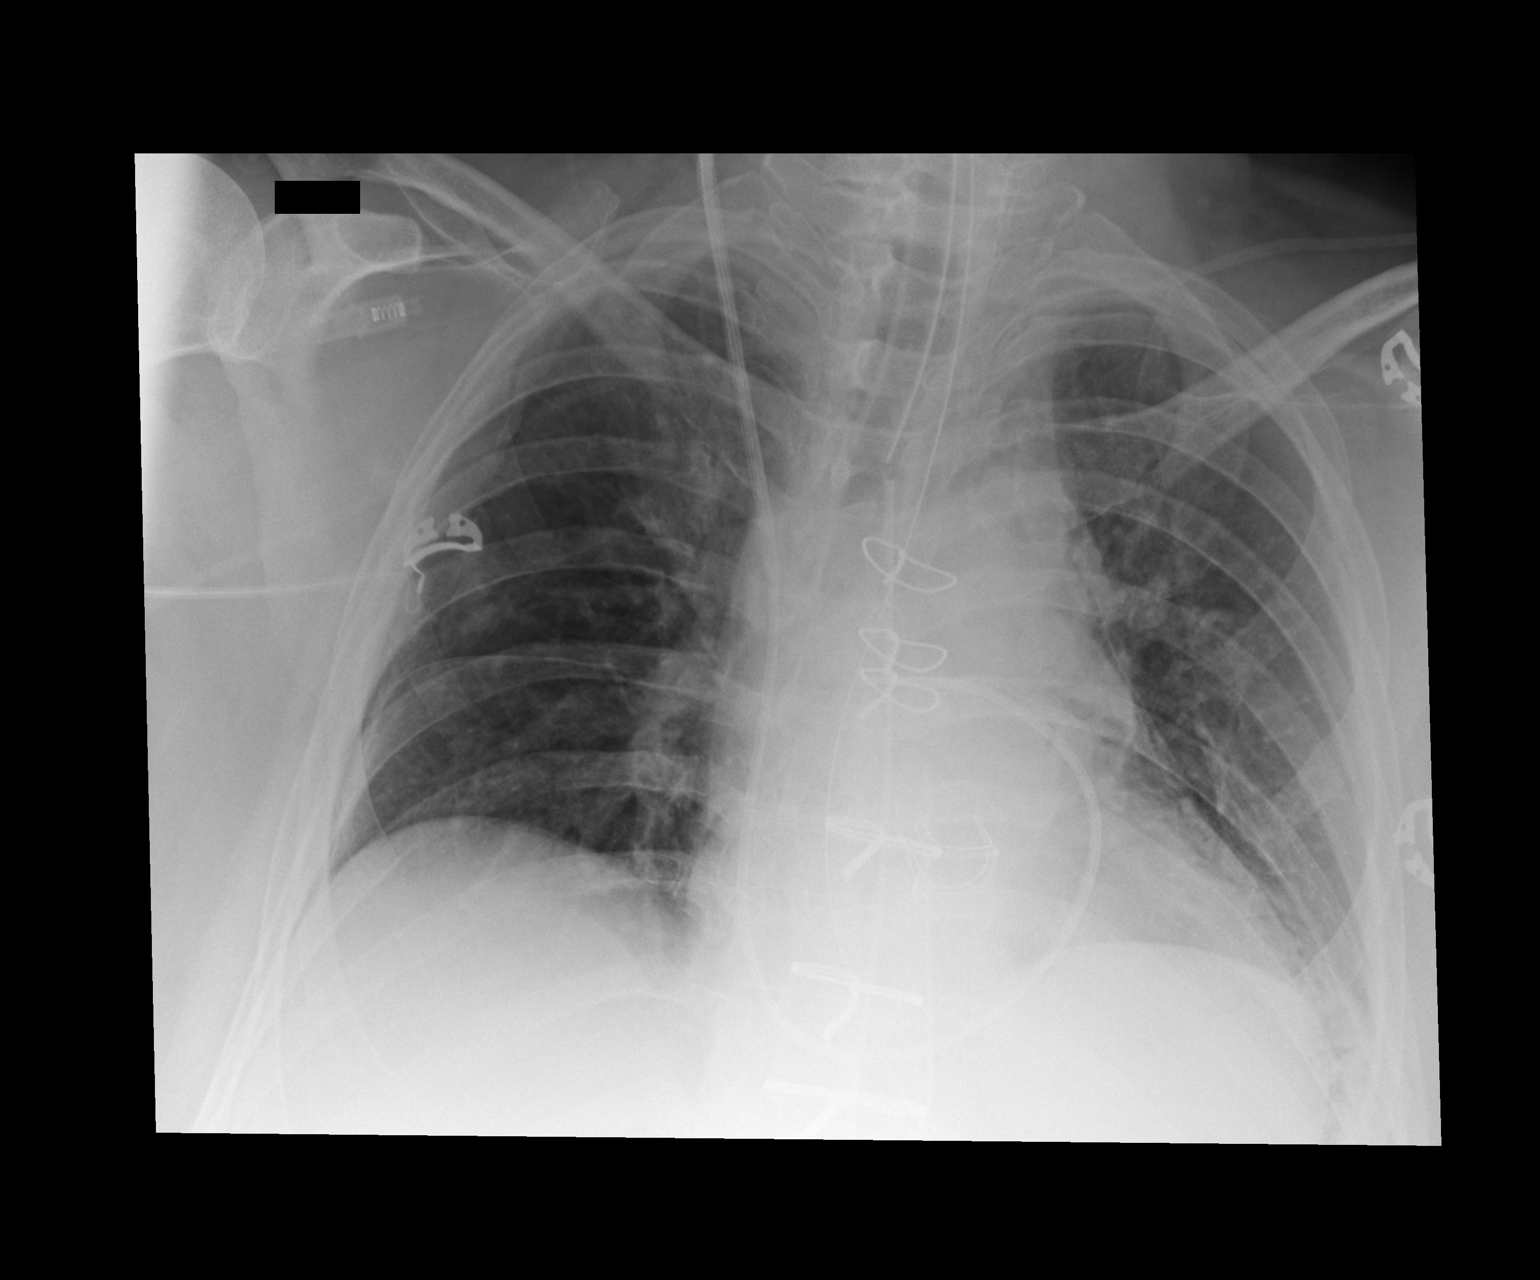

[1 of 1 positions shown; findings below may reference images not displayed]

FINDINGS: The patient has undergone interval median sternotomy and
aortic valve replacement.  Swan-Ganz catheter is seen in the
proximal right pulmonary artery.  A thin linear lucency is seen
along the right lateral chest wall which may represent a tiny right
pneumothorax.  Endotracheal tube, nasogastric tube and mediastinal
drains are seen in expected position.

Low lung volumes are seen with mild left basilar atelectasis.
Lungs otherwise clear.  Heart size is stable allowing for decreased
lung volumes.
IMPRESSION: 1.  Postoperative chest with questionable tiny right lateral
pneumothorax.
2.  Low lung volumes with mild left basilar atelectasis.

## 2011-08-05 SURGERY — REPLACEMENT, AORTIC VALVE, OPEN
Anesthesia: General | Site: Chest | Wound class: Clean

## 2011-08-05 MED ORDER — OXYCODONE HCL 5 MG PO TABS
5.0000 mg | ORAL_TABLET | ORAL | Status: DC | PRN
  Administered 2011-08-05: 5 mg via ORAL
  Administered 2011-08-06: 10 mg via ORAL
  Administered 2011-08-06 (×2): 5 mg via ORAL
  Administered 2011-08-06: 10 mg via ORAL
  Administered 2011-08-06 (×2): 5 mg via ORAL
  Administered 2011-08-06 – 2011-08-07 (×4): 10 mg via ORAL
  Filled 2011-08-05: qty 1
  Filled 2011-08-05 (×2): qty 2
  Filled 2011-08-05 (×2): qty 1
  Filled 2011-08-05 (×3): qty 2
  Filled 2011-08-05 (×2): qty 1
  Filled 2011-08-05: qty 2

## 2011-08-05 MED ORDER — MORPHINE SULFATE 2 MG/ML IJ SOLN
2.0000 mg | INTRAMUSCULAR | Status: DC | PRN
  Administered 2011-08-05 – 2011-08-06 (×7): 2 mg via INTRAVENOUS
  Filled 2011-08-05 (×2): qty 1
  Filled 2011-08-05: qty 2
  Filled 2011-08-05 (×3): qty 1

## 2011-08-05 MED ORDER — ONDANSETRON HCL 4 MG/2ML IJ SOLN: 4.0000 mg | Freq: Four times a day (QID) | INTRAMUSCULAR | Status: DC | PRN

## 2011-08-05 MED ORDER — HETASTARCH-ELECTROLYTES 6 % IV SOLN
INTRAVENOUS | Status: DC | PRN
  Administered 2011-08-05: 12:00:00 via INTRAVENOUS

## 2011-08-05 MED ORDER — ROCURONIUM BROMIDE 100 MG/10ML IV SOLN
INTRAVENOUS | Status: DC | PRN
  Administered 2011-08-05: 50 mg via INTRAVENOUS

## 2011-08-05 MED ORDER — SODIUM CHLORIDE 0.45 % IV SOLN
INTRAVENOUS | Status: DC
  Administered 2011-08-06: 500 mL via INTRAVENOUS
  Administered 2011-08-07: 20 mL/h via INTRAVENOUS

## 2011-08-05 MED ORDER — HEMOSTATIC AGENTS (NO CHARGE) OPTIME
TOPICAL | Status: DC | PRN
  Administered 2011-08-05: 3 via TOPICAL
  Administered 2011-08-05: 1 via TOPICAL

## 2011-08-05 MED ORDER — PROTAMINE SULFATE 10 MG/ML IV SOLN
INTRAVENOUS | Status: DC | PRN
  Administered 2011-08-05: 280 mg via INTRAVENOUS

## 2011-08-05 MED ORDER — SODIUM CHLORIDE 0.9 % IV SOLN
INTRAVENOUS | Status: DC
  Filled 2011-08-05 (×2): qty 1

## 2011-08-05 MED ORDER — VECURONIUM BROMIDE 10 MG IV SOLR
INTRAVENOUS | Status: DC | PRN
  Administered 2011-08-05 (×5): 5 mg via INTRAVENOUS

## 2011-08-05 MED ORDER — ACETAMINOPHEN 650 MG RE SUPP: 650.0000 mg | RECTAL | Status: AC

## 2011-08-05 MED ORDER — LACTATED RINGERS IV SOLN
INTRAVENOUS | Status: DC
  Administered 2011-08-05 (×2): via INTRAVENOUS

## 2011-08-05 MED ORDER — MORPHINE SULFATE 2 MG/ML IJ SOLN: 1.0000 mg | INTRAMUSCULAR | Status: AC | PRN

## 2011-08-05 MED ORDER — INSULIN REGULAR BOLUS VIA INFUSION
0.0000 [IU] | Freq: Three times a day (TID) | INTRAVENOUS | Status: DC
  Filled 2011-08-05: qty 10

## 2011-08-05 MED ORDER — MIDAZOLAM HCL 5 MG/5ML IJ SOLN
INTRAMUSCULAR | Status: DC | PRN
  Administered 2011-08-05: 2 mg via INTRAVENOUS
  Administered 2011-08-05 (×2): 3 mg via INTRAVENOUS
  Administered 2011-08-05: 2 mg via INTRAVENOUS

## 2011-08-05 MED ORDER — BISACODYL 5 MG PO TBEC
10.0000 mg | DELAYED_RELEASE_TABLET | Freq: Every day | ORAL | Status: DC
  Administered 2011-08-06: 10 mg via ORAL
  Filled 2011-08-05: qty 2

## 2011-08-05 MED ORDER — MIDAZOLAM HCL 2 MG/2ML IJ SOLN
2.0000 mg | INTRAMUSCULAR | Status: DC | PRN
  Administered 2011-08-06: 2 mg via INTRAVENOUS
  Filled 2011-08-05: qty 2

## 2011-08-05 MED ORDER — BISACODYL 10 MG RE SUPP: 10.0000 mg | Freq: Every day | RECTAL | Status: DC

## 2011-08-05 MED ORDER — DOCUSATE SODIUM 100 MG PO CAPS
200.0000 mg | ORAL_CAPSULE | Freq: Every day | ORAL | Status: DC
  Administered 2011-08-06: 200 mg via ORAL
  Filled 2011-08-05: qty 2

## 2011-08-05 MED ORDER — POTASSIUM CHLORIDE 10 MEQ/50ML IV SOLN: 10.0000 meq | INTRAVENOUS | Status: AC

## 2011-08-05 MED ORDER — SODIUM CHLORIDE 0.9 % IV SOLN: INTRAVENOUS | Status: DC

## 2011-08-05 MED ORDER — NITROGLYCERIN IN D5W 200-5 MCG/ML-% IV SOLN: 0.0000 ug/min | INTRAVENOUS | Status: DC

## 2011-08-05 MED ORDER — LACTATED RINGERS IV SOLN
INTRAVENOUS | Status: DC | PRN
  Administered 2011-08-05: 07:00:00 via INTRAVENOUS

## 2011-08-05 MED ORDER — PANTOPRAZOLE SODIUM 40 MG PO TBEC: 40.0000 mg | DELAYED_RELEASE_TABLET | Freq: Every day | ORAL | Status: DC

## 2011-08-05 MED ORDER — ALBUMIN HUMAN 5 % IV SOLN: 250.0000 mL | INTRAVENOUS | Status: AC | PRN

## 2011-08-05 MED ORDER — SODIUM CHLORIDE 0.9 % IJ SOLN
3.0000 mL | INTRAMUSCULAR | Status: DC | PRN
  Administered 2011-08-05: 3 mL via INTRAVENOUS

## 2011-08-05 MED ORDER — ALBUMIN HUMAN 25 % IV SOLN
INTRAVENOUS | Status: DC | PRN
  Administered 2011-08-05 (×2): via INTRAVENOUS

## 2011-08-05 MED ORDER — ASPIRIN 81 MG PO CHEW: 324.0000 mg | CHEWABLE_TABLET | Freq: Every day | ORAL | Status: DC

## 2011-08-05 MED ORDER — ATORVASTATIN CALCIUM 40 MG PO TABS
40.0000 mg | ORAL_TABLET | Freq: Every day | ORAL | Status: DC
  Administered 2011-08-05 – 2011-08-10 (×6): 40 mg via ORAL
  Filled 2011-08-05 (×7): qty 1

## 2011-08-05 MED ORDER — LACTATED RINGERS IV SOLN: 500.0000 mL | Freq: Once | INTRAVENOUS | Status: AC | PRN

## 2011-08-05 MED ORDER — VANCOMYCIN HCL IN DEXTROSE 1-5 GM/200ML-% IV SOLN
1000.0000 mg | Freq: Once | INTRAVENOUS | Status: AC
  Administered 2011-08-05: 1000 mg via INTRAVENOUS
  Filled 2011-08-05: qty 200

## 2011-08-05 MED ORDER — METOPROLOL TARTRATE 25 MG/10 ML ORAL SUSPENSION
12.5000 mg | Freq: Two times a day (BID) | ORAL | Status: DC
  Filled 2011-08-05 (×3): qty 5

## 2011-08-05 MED ORDER — METOPROLOL TARTRATE 1 MG/ML IV SOLN: 2.5000 mg | INTRAVENOUS | Status: DC | PRN

## 2011-08-05 MED ORDER — FENTANYL CITRATE 0.05 MG/ML IJ SOLN
INTRAMUSCULAR | Status: DC | PRN
  Administered 2011-08-05: 100 ug via INTRAVENOUS
  Administered 2011-08-05: 250 ug via INTRAVENOUS
  Administered 2011-08-05: 150 ug via INTRAVENOUS
  Administered 2011-08-05 (×2): 50 ug via INTRAVENOUS
  Administered 2011-08-05: 100 ug via INTRAVENOUS
  Administered 2011-08-05: 800 ug via INTRAVENOUS
  Administered 2011-08-05: 150 ug via INTRAVENOUS

## 2011-08-05 MED ORDER — METOPROLOL TARTRATE 12.5 MG HALF TABLET
12.5000 mg | ORAL_TABLET | Freq: Two times a day (BID) | ORAL | Status: DC
  Filled 2011-08-05 (×3): qty 1

## 2011-08-05 MED ORDER — SODIUM CHLORIDE 0.9 % IV SOLN: 250.0000 mL | INTRAVENOUS | Status: DC

## 2011-08-05 MED ORDER — ADULT MULTIVITAMIN W/MINERALS CH
1.0000 | ORAL_TABLET | Freq: Every day | ORAL | Status: DC
  Administered 2011-08-06: 1 via ORAL
  Filled 2011-08-05 (×2): qty 1

## 2011-08-05 MED ORDER — PROPOFOL 10 MG/ML IV EMUL
INTRAVENOUS | Status: DC | PRN
  Administered 2011-08-05: 50 mg via INTRAVENOUS

## 2011-08-05 MED ORDER — MAGNESIUM SULFATE 40 MG/ML IJ SOLN
4.0000 g | Freq: Once | INTRAMUSCULAR | Status: AC
  Administered 2011-08-05: 4 g via INTRAVENOUS
  Filled 2011-08-05: qty 100

## 2011-08-05 MED ORDER — PHENYLEPHRINE HCL 10 MG/ML IJ SOLN
0.0000 ug/min | INTRAVENOUS | Status: DC
  Filled 2011-08-05: qty 2

## 2011-08-05 MED ORDER — SODIUM CHLORIDE 0.9 % IV SOLN
0.1000 ug/kg/h | INTRAVENOUS | Status: DC
  Filled 2011-08-05 (×2): qty 2

## 2011-08-05 MED ORDER — ACETAMINOPHEN 500 MG PO TABS
1000.0000 mg | ORAL_TABLET | Freq: Four times a day (QID) | ORAL | Status: DC
  Administered 2011-08-06 – 2011-08-07 (×6): 1000 mg via ORAL
  Filled 2011-08-05 (×10): qty 2

## 2011-08-05 MED ORDER — DEXTROSE 5 % IV SOLN
1.5000 g | Freq: Two times a day (BID) | INTRAVENOUS | Status: DC
  Administered 2011-08-05 – 2011-08-06 (×3): 1.5 g via INTRAVENOUS
  Filled 2011-08-05 (×4): qty 1.5

## 2011-08-05 MED ORDER — SODIUM CHLORIDE 0.9 % IJ SOLN
3.0000 mL | Freq: Two times a day (BID) | INTRAMUSCULAR | Status: DC
  Administered 2011-08-06 (×2): 3 mL via INTRAVENOUS

## 2011-08-05 MED ORDER — ASPIRIN EC 325 MG PO TBEC
325.0000 mg | DELAYED_RELEASE_TABLET | Freq: Every day | ORAL | Status: DC
  Administered 2011-08-06: 325 mg via ORAL
  Filled 2011-08-05 (×2): qty 1

## 2011-08-05 MED ORDER — FAMOTIDINE IN NACL 20-0.9 MG/50ML-% IV SOLN
20.0000 mg | Freq: Two times a day (BID) | INTRAVENOUS | Status: AC
  Administered 2011-08-05: 20 mg via INTRAVENOUS
  Filled 2011-08-05: qty 50

## 2011-08-05 MED ORDER — HEPARIN SODIUM (PORCINE) 1000 UNIT/ML IJ SOLN
INTRAMUSCULAR | Status: DC | PRN
  Administered 2011-08-05: 30000 [IU] via INTRAVENOUS

## 2011-08-05 MED ORDER — 0.9 % SODIUM CHLORIDE (POUR BTL) OPTIME
TOPICAL | Status: DC | PRN
  Administered 2011-08-05: 1000 mL

## 2011-08-05 MED ORDER — ACETAMINOPHEN 160 MG/5ML PO SOLN: 975.0000 mg | Freq: Four times a day (QID) | ORAL | Status: DC

## 2011-08-05 MED ORDER — ACETAMINOPHEN 160 MG/5ML PO SOLN
650.0000 mg | ORAL | Status: AC
  Administered 2011-08-05: 650 mg

## 2011-08-05 SURGICAL SUPPLY — 76 items
ADAPTER CARDIO PERF ANTE/RETRO (ADAPTER) ×2 IMPLANT
APPLICATOR COTTON TIP 6IN STRL (MISCELLANEOUS) ×2 IMPLANT
ATTRACTOMAT 16X20 MAGNETIC DRP (DRAPES) ×2 IMPLANT
BAG DECANTER FOR FLEXI CONT (MISCELLANEOUS) ×2 IMPLANT
BLADE STERNUM SYSTEM 6 (BLADE) ×2 IMPLANT
BLADE SURG 15 STRL LF DISP TIS (BLADE) ×2 IMPLANT
BLADE SURG 15 STRL SS (BLADE) ×2
BOOT SUTURE AID YELLOW STND (SUTURE) ×2 IMPLANT
CANISTER SUCTION 2500CC (MISCELLANEOUS) ×2 IMPLANT
CANNULA AORTIC HI-FLOW 6.5M20F (CANNULA) ×2 IMPLANT
CANNULA GUNDRY RCSP 15FR (MISCELLANEOUS) ×2 IMPLANT
CANNULA VENOUS DUAL 32/40 (CANNULA) IMPLANT
CATH CPB KIT GERHARDT (MISCELLANEOUS) ×2 IMPLANT
CATH HEART VENT LEFT (CATHETERS) ×2 IMPLANT
CATH RETROPLEGIA CORONARY 14FR (CATHETERS) ×2 IMPLANT
CATH THORACIC 28FR (CATHETERS) ×2 IMPLANT
CATH/SQUID NICHOLS JEHLE COR (CATHETERS) ×2 IMPLANT
CLOTH BEACON ORANGE TIMEOUT ST (SAFETY) ×2 IMPLANT
CONN ST 1/4X3/8  BEN (MISCELLANEOUS) ×1
CONN ST 1/4X3/8 BEN (MISCELLANEOUS) ×1 IMPLANT
COVER SURGICAL LIGHT HANDLE (MISCELLANEOUS) ×4 IMPLANT
CRADLE DONUT ADULT HEAD (MISCELLANEOUS) ×2 IMPLANT
DRAIN CHANNEL 28F RND 3/8 FF (WOUND CARE) ×2 IMPLANT
DRAIN CHANNEL 32F RND 10.7 FF (WOUND CARE) IMPLANT
DRAPE CARDIOVASCULAR INCISE (DRAPES) ×1
DRAPE SLUSH MACHINE 52X66 (DRAPES) ×2 IMPLANT
DRAPE SLUSH/WARMER DISC (DRAPES) IMPLANT
DRAPE SRG 135X102X78XABS (DRAPES) ×1 IMPLANT
DRSG COVADERM 4X14 (GAUZE/BANDAGES/DRESSINGS) ×2 IMPLANT
ELECT BLADE 4.0 EZ CLEAN MEGAD (MISCELLANEOUS) ×2
ELECT CAUTERY BLADE 6.4 (BLADE) ×2 IMPLANT
ELECT REM PT RETURN 9FT ADLT (ELECTROSURGICAL) ×4
ELECTRODE BLDE 4.0 EZ CLN MEGD (MISCELLANEOUS) ×1 IMPLANT
ELECTRODE REM PT RTRN 9FT ADLT (ELECTROSURGICAL) ×2 IMPLANT
GLOVE BIO SURGEON STRL SZ 6 (GLOVE) ×6 IMPLANT
GLOVE BIO SURGEON STRL SZ 6.5 (GLOVE) ×12 IMPLANT
GLOVE BIO SURGEON STRL SZ7.5 (GLOVE) ×4 IMPLANT
GOWN STRL NON-REIN LRG LVL3 (GOWN DISPOSABLE) ×16 IMPLANT
HEMOSTAT POWDER SURGIFOAM 1G (HEMOSTASIS) ×6 IMPLANT
HEMOSTAT SURGICEL 2X14 (HEMOSTASIS) ×2 IMPLANT
INSERT FOGARTY XLG (MISCELLANEOUS) IMPLANT
KIT BASIN OR (CUSTOM PROCEDURE TRAY) ×2 IMPLANT
KIT ROOM TURNOVER OR (KITS) ×2 IMPLANT
KIT SUCTION CATH 14FR (SUCTIONS) ×4 IMPLANT
LEAD PACING MYOCARDI (MISCELLANEOUS) ×2 IMPLANT
LINE VENT (MISCELLANEOUS) ×2 IMPLANT
NEEDLE 18GX1X1/2 (RX/OR ONLY) (NEEDLE) ×2 IMPLANT
NS IRRIG 1000ML POUR BTL (IV SOLUTION) ×10 IMPLANT
PACK OPEN HEART (CUSTOM PROCEDURE TRAY) ×2 IMPLANT
PAD ARMBOARD 7.5X6 YLW CONV (MISCELLANEOUS) ×4 IMPLANT
SET CARDIOPLEGIA MPS 5001102 (MISCELLANEOUS) ×2 IMPLANT
SPONGE GAUZE 4X4 12PLY (GAUZE/BANDAGES/DRESSINGS) ×2 IMPLANT
SUT BONE WAX W31G (SUTURE) ×2 IMPLANT
SUT ETHIBON 2 0 V 52N 30 (SUTURE) ×4 IMPLANT
SUT ETHIBOND 2 0 SH (SUTURE) ×4 IMPLANT
SUT ETHIBOND NAB MH 2-0 36IN (SUTURE) ×2 IMPLANT
SUT PROLENE 3 0 RB 1 (SUTURE) IMPLANT
SUT PROLENE 3 0 SH 1 (SUTURE) ×8 IMPLANT
SUT PROLENE 4 0 RB 1 (SUTURE) ×4
SUT PROLENE 4-0 RB1 .5 CRCL 36 (SUTURE) ×4 IMPLANT
SUT SILK 3 0 SH CR/8 (SUTURE) ×2 IMPLANT
SUT STEEL 6MS V (SUTURE) ×2 IMPLANT
SUT STEEL SZ 6 DBL 3X14 BALL (SUTURE) ×2 IMPLANT
SUT VIC AB 1 CTX 18 (SUTURE) ×4 IMPLANT
SUTURE E-PAK OPEN HEART (SUTURE) ×4 IMPLANT
SYSTEM SAHARA CHEST DRAIN ATS (WOUND CARE) ×2 IMPLANT
TAPE CLOTH SURG 4X10 WHT LF (GAUZE/BANDAGES/DRESSINGS) ×2 IMPLANT
TAPE PAPER 2X10 WHT MICROPORE (GAUZE/BANDAGES/DRESSINGS) ×2 IMPLANT
TOWEL OR 17X24 6PK STRL BLUE (TOWEL DISPOSABLE) ×4 IMPLANT
TOWEL OR 17X26 10 PK STRL BLUE (TOWEL DISPOSABLE) ×4 IMPLANT
TRAY FOLEY IC TEMP SENS 14FR (CATHETERS) ×2 IMPLANT
TUBE SUCT INTRACARD DLP 20F (MISCELLANEOUS) ×2 IMPLANT
UNDERPAD 30X30 INCONTINENT (UNDERPADS AND DIAPERS) ×2 IMPLANT
VALVE MAGNA EASE AORTIC 23MM (Prosthesis & Implant Heart) ×2 IMPLANT
VENT LEFT HEART 12002 (CATHETERS) ×4
WATER STERILE IRR 1000ML POUR (IV SOLUTION) ×4 IMPLANT

## 2011-08-05 NOTE — Anesthesia Postprocedure Evaluation (Signed)
  Anesthesia Post-op Note  Patient: Darrell [name]  Procedure(s) Performed: Procedure(s) (LRB): AORTIC VALVE REPLACEMENT (AVR) (N/A)  Patient Location: PACU  Anesthesia Type: General  Level of Consciousness: sedated and unresponsive  Airway and Oxygen Therapy: Patient remains intubated per anesthesia plan  Post-op Pain: none  Post-op Assessment: Post-op Vital signs reviewed, Patient's Cardiovascular Status Stable and Respiratory Function Stable  Post-op Vital Signs: Reviewed and stable  Complications: No apparent anesthesia complications

## 2011-08-05 NOTE — Progress Notes (Signed)
SICU p.m. rounds status post aortic valve replacement  Hemodynamic stable, patient extubated, 6R labs hematocrit 32 potassium 4.1 creatinine 0.7 Patient doing well

## 2011-08-05 NOTE — Progress Notes (Signed)
  Echocardiogram Echocardiogram Transesophageal has been performed.  Dorena Cookey 08/05/2011, 10:21 AM

## 2011-08-05 NOTE — Brief Op Note (Addendum)
08/05/2011  10:48 AM  PATIENT:  Darrell [name]  61 y.o. male  PRE-OPERATIVE DIAGNOSIS:  Severe Aortic Stenosis  POST-OPERATIVE DIAGNOSIS:  Severe Aortic Stenosis  PROCEDURE: AORTIC VALVE REPLACEMENT (AVR) using a Magna Ease tissue valve (size 23 mm)  SURGEON:  Surgeon(s) and Role:    * [name], MD - Primary  PHYSICIAN ASSISTANT: Doree Fudge PA-C   ANESTHESIA:   general  EBL:  Total I/O In: -  Out: 650 [Urine:650]   DRAINS: two mt's COUNTS:  YES  DICTATION: .Dragon Dictation  PLAN OF CARE: Admit to inpatient   PATIENT DISPOSITION:  ICU - intubated and hemodynamically stable.   Delay start of Pharmacological VTE agent (>24hrs) due to surgical blood loss or risk of bleeding: yes   PRE OP WEIGHT: 92 kg

## 2011-08-05 NOTE — Anesthesia Procedure Notes (Signed)
Procedure Name: Intubation Date/Time: 08/05/2011 7:38 AM Performed by: Jerilee Hoh Pre-anesthesia Checklist: Patient identified, Emergency Drugs available, Suction available and Patient being monitored Patient Re-evaluated:Patient Re-evaluated prior to inductionOxygen Delivery Method: Circle system utilized Preoxygenation: Pre-oxygenation with 100% oxygen Intubation Type: IV induction Ventilation: Mask ventilation without difficulty and Oral airway inserted - appropriate to patient size Laryngoscope Size: Mac and 4 Grade View: Grade I Tube type: Oral Tube size: 8.0 mm Number of attempts: 1 Airway Equipment and Method: Stylet Placement Confirmation: ETT inserted through vocal cords under direct vision,  breath sounds checked- equal and bilateral and positive ETCO2 Secured at: 22 cm Tube secured with: Tape Dental Injury: Teeth and Oropharynx as per pre-operative assessment

## 2011-08-05 NOTE — Preoperative (Signed)
Beta Blockers   Reason not to administer Beta Blockers:Not Applicable 

## 2011-08-05 NOTE — Procedures (Signed)
Extubation Procedure Note  Patient Details:   Name: Darrell [name] DOB: August 11, 1950 MRN: [id number]   Airway Documentation:  Airway 8 mm (Active)  Secured at (cm) 22 cm 08/05/2011 12:51 PM  Measured From Lips 08/05/2011 12:51 PM  Secured Location Right 08/05/2011 12:51 PM  Secured By Caron Presume Tape 08/05/2011 12:51 PM    Evaluation  O2 sats: stable throughout Complications: No apparent complications Patient did tolerate procedure well. Bilateral Breath Sounds: Clear   Yes NIF -55, VC >1000, IS performed x's 5 with good effort avg 1000  Ok [place], [place] 08/05/2011, 5:14 PM

## 2011-08-05 NOTE — Anesthesia Preprocedure Evaluation (Addendum)
Anesthesia Evaluation  Patient identified by MRN, date of birth, ID band Patient awake    Reviewed: Allergy & Precautions, H&P , NPO status , Patient's Chart, lab work & pertinent test results, reviewed documented beta blocker date and time   Airway Mallampati: I TM Distance: >3 FB Neck ROM: Full    Dental No notable dental hx. (+) Edentulous Lower and Edentulous Upper   Pulmonary shortness of breath,  breath sounds clear to auscultation  Pulmonary exam normal       Cardiovascular hypertension, Pt. on medications + Past MI + Valvular Problems/Murmurs AS Rhythm:Regular Rate:Normal + Systolic murmurs    Neuro/Psych PSYCHIATRIC DISORDERS Depression negative neurological ROS     GI/Hepatic Neg liver ROS, GERD-  Medicated,  Endo/Other  Diabetes mellitus-, Type 2, Oral Hypoglycemic Agents  Renal/GU negative Renal ROS  negative genitourinary   Musculoskeletal   Abdominal   Peds  Hematology negative hematology ROS (+)   Anesthesia Other Findings   Reproductive/Obstetrics                        Anesthesia Physical Anesthesia Plan  ASA: IV  Anesthesia Plan: General   Post-op Pain Management:    Induction: Intravenous  Airway Management Planned: Oral ETT  Additional Equipment: Arterial line, CVP, PA Cath, Ultrasound Guidance Line Placement and TEE  Intra-op Plan:   Post-operative Plan: Post-operative intubation/ventilation  Informed Consent: I have reviewed the patients History and Physical, chart, labs and discussed the procedure including the risks, benefits and alternatives for the proposed anesthesia with the patient or authorized representative who has indicated his/her understanding and acceptance.   Dental advisory given  Plan Discussed with: CRNA, Anesthesiologist and Surgeon  Anesthesia Plan Comments:        Anesthesia Quick Evaluation

## 2011-08-05 NOTE — Transfer of Care (Signed)
Immediate Anesthesia Transfer of Care Note  Patient: Darrell [name]  Procedure(s) Performed: Procedure(s) (LRB): AORTIC VALVE REPLACEMENT (AVR) (N/A)  Patient Location: SICU  Anesthesia Type: General  Level of Consciousness: sedated and Patient remains intubated per anesthesia plan  Airway & Oxygen Therapy: Patient remains intubated per anesthesia plan and Patient placed on Ventilator (see vital sign flow sheet for setting)  Post-op Assessment: Report given to PACU RN, Post -op Vital signs reviewed and stable and Patient moving all extremities  Post vital signs: Reviewed and stable  Complications: No apparent anesthesia complications
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
## 2011-08-06 ENCOUNTER — Encounter (HOSPITAL_COMMUNITY): Payer: Self-pay | Admitting: Nurse Practitioner

## 2011-08-06 ENCOUNTER — Inpatient Hospital Stay (HOSPITAL_COMMUNITY): Payer: BC Managed Care – PPO

## 2011-08-06 LAB — POCT I-STAT, CHEM 8
BUN: 16 mg/dL (ref 6–23)
Calcium, Ion: 1.12 mmol/L (ref 1.12–1.32)
Chloride: 102 mEq/L (ref 96–112)
Creatinine, Ser: 0.8 mg/dL (ref 0.50–1.35)
Glucose, Bld: 133 mg/dL — ABNORMAL HIGH (ref 70–99)
HCT: 31 % — ABNORMAL LOW (ref 39.0–52.0)
Hemoglobin: 10.5 g/dL — ABNORMAL LOW (ref 13.0–17.0)
Potassium: 4 mEq/L (ref 3.5–5.1)
Sodium: 138 mEq/L (ref 135–145)
TCO2: 24 mmol/L (ref 0–100)

## 2011-08-06 LAB — GLUCOSE, CAPILLARY
Glucose-Capillary: 106 mg/dL — ABNORMAL HIGH (ref 70–99)
Glucose-Capillary: 112 mg/dL — ABNORMAL HIGH (ref 70–99)
Glucose-Capillary: 115 mg/dL — ABNORMAL HIGH (ref 70–99)
Glucose-Capillary: 116 mg/dL — ABNORMAL HIGH (ref 70–99)
Glucose-Capillary: 117 mg/dL — ABNORMAL HIGH (ref 70–99)
Glucose-Capillary: 117 mg/dL — ABNORMAL HIGH (ref 70–99)
Glucose-Capillary: 118 mg/dL — ABNORMAL HIGH (ref 70–99)
Glucose-Capillary: 121 mg/dL — ABNORMAL HIGH (ref 70–99)
Glucose-Capillary: 122 mg/dL — ABNORMAL HIGH (ref 70–99)
Glucose-Capillary: 127 mg/dL — ABNORMAL HIGH (ref 70–99)
Glucose-Capillary: 130 mg/dL — ABNORMAL HIGH (ref 70–99)
Glucose-Capillary: 132 mg/dL — ABNORMAL HIGH (ref 70–99)
Glucose-Capillary: 134 mg/dL — ABNORMAL HIGH (ref 70–99)
Glucose-Capillary: 136 mg/dL — ABNORMAL HIGH (ref 70–99)
Glucose-Capillary: 136 mg/dL — ABNORMAL HIGH (ref 70–99)
Glucose-Capillary: 141 mg/dL — ABNORMAL HIGH (ref 70–99)
Glucose-Capillary: 143 mg/dL — ABNORMAL HIGH (ref 70–99)
Glucose-Capillary: 83 mg/dL (ref 70–99)

## 2011-08-06 LAB — BASIC METABOLIC PANEL
BUN: 15 mg/dL (ref 6–23)
Chloride: 106 mEq/L (ref 96–112)
Creatinine, Ser: 0.77 mg/dL (ref 0.50–1.35)
GFR calc Af Amer: >90 mL/min (ref >90)
GFR calc non Af Amer: >90 mL/min (ref >90)
Sodium: 137 mEq/L (ref 135–145)

## 2011-08-06 LAB — CBC
HCT: 31.4 % — ABNORMAL LOW (ref 39.0–52.0)
Hemoglobin: 11 g/dL — ABNORMAL LOW (ref 13.0–17.0)
Hemoglobin: 11 g/dL — ABNORMAL LOW (ref 13.0–17.0)
MCH: 32.4 pg (ref 26.0–34.0)
MCH: 33.1 pg (ref 26.0–34.0)
MCHC: 35 g/dL (ref 30.0–36.0)
MCV: 92.9 fL (ref 78.0–100.0)
RBC: 3.39 MIL/uL — ABNORMAL LOW (ref 4.22–5.81)
WBC: 11.7 10*3/uL — ABNORMAL HIGH (ref 4.0–10.5)

## 2011-08-06 IMAGING — CR DG CHEST 1V PORT
1 series · 1 of 1 positions shown · non-contrast
Comparison: [DATE]

CLINICAL DATA: Postop

PORTABLE CHEST - 1 VIEW

[AP]
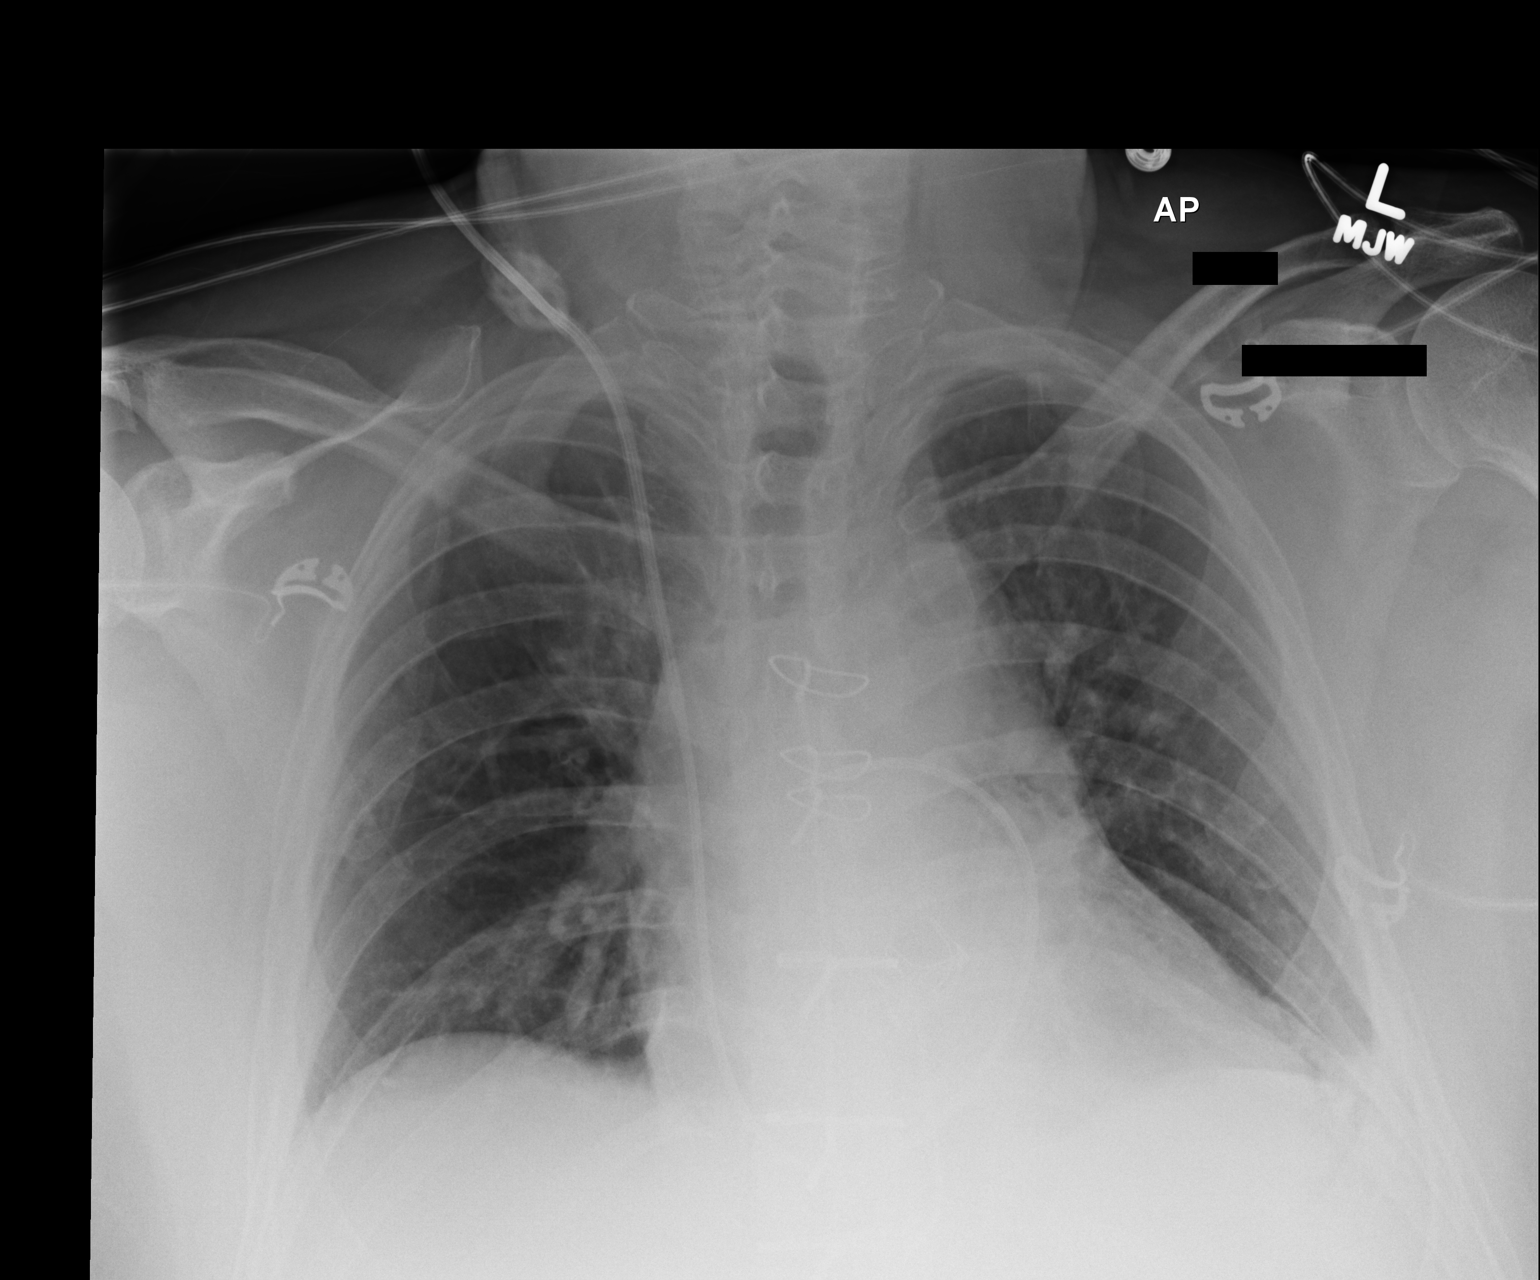

[1 of 1 positions shown; findings below may reference images not displayed]

FINDINGS: Cardiomediastinal silhouette is stable.  Status post
median sternotomy again noted.  Stable left basilar atelectasis.
Stable right Swan-Ganz catheter position.  No acute infiltrate or
pulmonary edema.  No diagnostic pneumothorax.
IMPRESSION: Status post median sternotomy again noted.  Stable left basilar
atelectasis.  Stable right Swan-Ganz catheter position.  No acute
infiltrate or pulmonary edema.  No diagnostic pneumothorax.]

## 2011-08-06 MED ORDER — METOPROLOL TARTRATE 25 MG PO TABS
25.0000 mg | ORAL_TABLET | Freq: Two times a day (BID) | ORAL | Status: DC
  Administered 2011-08-06 (×2): 25 mg via ORAL
  Filled 2011-08-06 (×4): qty 1

## 2011-08-06 MED ORDER — LISINOPRIL 2.5 MG PO TABS
2.5000 mg | ORAL_TABLET | Freq: Every day | ORAL | Status: DC
  Administered 2011-08-06 – 2011-08-10 (×5): 2.5 mg via ORAL
  Filled 2011-08-06 (×7): qty 1

## 2011-08-06 MED ORDER — INSULIN ASPART 100 UNIT/ML ~~LOC~~ SOLN
0.0000 [IU] | SUBCUTANEOUS | Status: DC
  Administered 2011-08-06 – 2011-08-07 (×5): 2 [IU] via SUBCUTANEOUS

## 2011-08-06 MED ORDER — INSULIN GLARGINE 100 UNIT/ML ~~LOC~~ SOLN
25.0000 [IU] | Freq: Every day | SUBCUTANEOUS | Status: DC
  Administered 2011-08-06: 25 [IU] via SUBCUTANEOUS

## 2011-08-06 MED ORDER — METOPROLOL TARTRATE 25 MG/10 ML ORAL SUSPENSION
12.5000 mg | Freq: Two times a day (BID) | ORAL | Status: DC
  Filled 2011-08-06 (×4): qty 5

## 2011-08-06 MED FILL — Potassium Chloride Inj 2 mEq/ML: INTRAVENOUS | Qty: 40 | Status: AC

## 2011-08-06 MED FILL — Magnesium Sulfate Inj 50%: INTRAMUSCULAR | Qty: 10 | Status: AC

## 2011-08-06 NOTE — Op Note (Signed)
NAMEMarland [name]  OCIE, TINO NO.:  [id number]  MEDICAL RECORD NO.:  [id number]  LOCATION:  2312                         FACILITY:  [facility]  PHYSICIAN:  Sheliah Plane, MD    DATE OF BIRTH:  Dec 14, 1950  DATE OF PROCEDURE:  08/05/2011 DATE OF DISCHARGE:                              OPERATIVE REPORT   PREOPERATIVE DIAGNOSIS:  Critical aortic stenosis.  POSTOPERATIVE DIAGNOSIS:  Critical Aortic Stenosis  SURGICAL PROCEDURE:  Aortic valve replacement with pericardial tissue valve, [employer], 23 mm, model 3300TFX, serial [id number].  SURGEON:  Sheliah Plane, MD  FIRST ASSISTANT:  Doree Fudge, PA.  BRIEF HISTORY:  The patient is a 61 year old male with a history of a cardiac murmur who recently had increasing symptoms of shortness of breath with exertion.  Followup echocardiogram showed significant progression of aortic stenosis with a velocity on echo of greater than 4 cm/sec.  He underwent cardiac catheterization and had no significant coronary artery disease.  The patient also had been having increasing back pain and was being considered for back surgery, which precipitated his followup echocardiogram and cardiac clearance.  The risks and options were discussed with the patient in detail including the use of mechanical versus tissue valves.  The patient preferred a tissue valve, especially with his known need to have further surgery involving his back and other health issues.  He preferred not to have lifelong Coumadin.  The patient agreed and signed the informed consent.  DESCRIPTION OF PROCEDURE:  With Swan-Ganz and arterial line monitors were in place, the patient underwent general endotracheal anesthesia without incident.  Dr. Sampson Goon placed a TEE probe and confirmed a normally functioning mitral valve, severely calcified aortic valve.  At the time of echo in the OR, estimated valve area was 2.5 to 2.6.  The patient had significant left  ventricular hypertrophy.  The skin of the chest and legs was prepped with Betadine and draped in the usual sterile manner.  Time-out procedure was carried out.  Median sternotomy was performed.  Pericardium was opened.  The patient's aorta was not dilated, measuring approximately 3 cm.  He was systemically heparinized, ascending aorta was cannulated, right atrium was cannulated, and aortic root and cardioplegia needle was placed into the ascending aorta. Retrograde cardioplegia catheter was also placed.  He was then placed on cardiopulmonary bypass 2.4 L/min/m2.  Body temperature was cooled to 32 degrees.  Aortic crossclamp was applied.  600 mL of cold blood potassium cardioplegia was administered antegrade.  With cardiac arrest, myocardial septal temperatures were monitored throughout the crossclamp. Attention was turned to the proximal aorta where a transverse aortotomy was performed.  This gave good exposure of the tricuspid, severely calcified aortic valve.  The valve was excised.  The annulus was debrided off calcium.  Care was taken to remove all loose calcific debris.  The right and left ostia of the coronaries were easily identified.  A #23 pericardial tissue valve sizer approximated the size of the annulus.  The #2 Tycron pledgeted sutures were placed in circumferential manner with the pledgets on the ventricular surface and used to seat the aortic valve which seated well without impingement on the coronaries.  Intermittently  during the procedure, additional retrograde cardioplegia was administered.  Aortotomy was then closed with horizontal mattress Prolene suture over felt strips.  Prior to complete closure, the heart was allowed to passively fill and de-air. Further de-airing was performed as the cross-clamp was removed through the aortic root vent.  The patient required electrical defibrillation and returned to a sinus rhythm.  Body temperature was rewarmed to 37 degrees.   With the operative field hemostatic and the TEE showing good function of the newly placed valve without evidence of aortic insufficiency and normally functioning mitral valve, right superior pulmonary vein vent was removed and the patient was then ventilated and weaned from cardiopulmonary bypass without difficulty.  Total crossclamp time was 83 minutes.  Total pump time was 120 minutes.  Atrial and ventricular pacing wires were applied.  He was atrially paced to increased rate slightly.  He was decannulated in usual fashion. Protamine sulfate was administered with the operative field hemostatic. The 2 Blake mediastinal drains were left in place.  Pericardium was reapproximated.  Sternum was closed with #6 stainless steel wire. Fascia closed with interrupted 0-Vicryl running, 3-0 Vicryl subcutaneous tissue, 4-0 subcuticular stitch in skin edges.  Dry dressings were applied.  Sponge and needle count was reported as correct at completion of procedure.  The patient tolerated the procedure without obvious complication and was transferred to the Surgical Intensive Care unit for further postoperative care.     Sheliah Plane, MD     EG/MEDQ  D:  08/06/2011  T:  08/06/2011  Job:  [id number]  cc:   Dr. Darlin Priestly

## 2011-08-06 NOTE — Progress Notes (Signed)
Patient ID: Darrell [name], male   DOB: 09-Dec-1950, 61 y.o.   MRN: [id number] POD # 1 AVR  Up in chair Comfortable BP 124/56  Pulse 93  Temp 98.2 F (36.8 C) (Oral)  Resp 18  Ht 5\' 9"  (1.753 m)  Wt 210 lb 5.1 oz (95.4 kg)  BMI 31.06 kg/m2  SpO2 97% Thrombocytopenia- PLT 64 K down from 73 K- not receiving heparin  Doing well POD # 1

## 2011-08-06 NOTE — Progress Notes (Signed)
Patient ID: Darrell [name], male   DOB: 12-Dec-1950, 61 y.o.   MRN: [id number] TCTS DAILY PROGRESS NOTE                   [address]            [place] [address]          [phone]      1 Day Post-Op Procedure(s) (LRB): AORTIC VALVE REPLACEMENT (AVR) (N/A)  Total Length of Stay:  LOS: 1 day   Subjective: Stable post op, awake and alert  Objective: Vital signs in last 24 hours: Temp:  [96.4 F (35.8 C)-100.4 F (38 C)] 99.1 F (37.3 C) (06/12 0740) Pulse Rate:  [88-90] 88  (06/12 0700) Cardiac Rhythm:  [-] Atrial paced (06/12 0754) Resp:  [9-22] 16  (06/12 0700) BP: (91-130)/(58-70) 106/64 mmHg (06/11 1700) SpO2:  [94 %-100 %] 94 % (06/12 0700) Arterial Line BP: (106-148)/(55-77) 142/59 mmHg (06/12 0700) FiO2 (%):  [40 %-50 %] 40 % (06/11 1607) Weight:  [201 lb 15.1 oz (91.6 kg)-210 lb 5.1 oz (95.4 kg)] 210 lb 5.1 oz (95.4 kg) (06/12 0500)  Filed Weights   08/04/11 1636 08/05/11 1245 08/06/11 0500  Weight: 202 lb (91.627 kg) 201 lb 15.1 oz (91.6 kg) 210 lb 5.1 oz (95.4 kg)    Weight change: -0.9 oz (-0.027 kg)   Hemodynamic parameters for last 24 hours: PAP: (23-38)/(12-26) 33/17 mmHg CO:  [5.4 L/min-6.6 L/min] 6.6 L/min CI:  [2.6 L/min/m2-3.2 L/min/m2] 3.2 L/min/m2  Intake/Output from previous day: 06/11 0701 - 06/12 0700 In: 5281.6 [I.V.:3254.6; Blood:627; IV Piggyback:1400] Out: 4010 [Urine:2620; Drains:490; Blood:900]  Intake/Output this shift: Total I/O In: -  Out: 40 [Urine:40]  Current Meds: Scheduled Meds:   . acetaminophen (TYLENOL) oral liquid 160 mg/5 mL  650 mg Per Tube NOW   Or  . acetaminophen  650 mg Rectal NOW  . acetaminophen  1,000 mg Oral Q6H   Or  . acetaminophen (TYLENOL) oral liquid 160 mg/5 mL  975 mg Per Tube Q6H  . aspirin EC  325 mg Oral Daily   Or  . aspirin  324 mg Per Tube Daily  . atorvastatin  40 mg Oral q1800  . bisacodyl  10 mg Oral Daily   Or  . bisacodyl  10 mg Rectal Daily  . cefUROXime  (ZINACEF)  IV  1.5 g Intravenous To OR  . cefUROXime (ZINACEF)  IV  1.5 g Intravenous Q12H  . dexmedetomidine (PRECEDEX) IV infusion for high rates  0.1-0.7 mcg/kg/hr Intravenous To OR  . docusate sodium  200 mg Oral Daily  . famotidine (PEPCID) IV  20 mg Intravenous Q12H  . insulin regular  0-10 Units Intravenous TID WC  . magnesium sulfate  4 g Intravenous Once  . metoprolol tartrate  12.5 mg Oral BID   Or  . metoprolol tartrate  12.5 mg Per Tube BID  . multivitamin with minerals  1 tablet Oral Daily  . nitroglycerin-nicardipine-HEPARIN-sodium bicarbonate irrigation for artery spasm   Irrigation To OR  . pantoprazole  40 mg Oral Q1200  . phenylephrine (NEO-SYNEPHRINE) Adult infusion  30-200 mcg/min Intravenous To OR  . potassium chloride  10 mEq Intravenous Q1 Hr x 3  . sodium chloride  3 mL Intravenous Q12H  . tranexamic acid (CYKLOKAPRON) infusion ([facility])  1.5 mg/kg/hr Intravenous To OR  . vancomycin  1,000 mg Intravenous Once  .      .      .      .      .      .      .      .      Marland Kitchen  Continuous Infusions:   . sodium chloride 20 mL/hr at 08/05/11 1245  . sodium chloride Stopped (08/05/11 1245)  . sodium chloride    . dexmedetomidine (PRECEDEX) IV infusion Stopped (08/05/11 1530)  . insulin (NOVOLIN-R) infusion 3.4 mL/hr at 08/06/11 0700  . lactated ringers 20 mL/hr at 08/05/11 1846  . nitroGLYCERIN Stopped (08/05/11 1245)  . phenylephrine (NEO-SYNEPHRINE) Adult infusion Stopped (08/05/11 1600)   PRN Meds:.albumin human, lactated ringers, metoprolol, midazolam, morphine injection, morphine injection, ondansetron (ZOFRAN) IV, oxyCODONE, sodium chloride, DISCONTD: 0.9 % irrigation (POUR BTL), DISCONTD: hemostatic agents  General appearance: alert and cooperative Neurologic: intact Heart: regular rate and rhythm, S1, S2 normal, no murmur, click, rub or gallop Lungs: clear to auscultation bilaterally and normal percussion bilaterally Abdomen: soft, non-tender; bowel  sounds normal; no masses,  no organomegaly Extremities: extremities normal, atraumatic, no cyanosis or edema and Homans sign is negative, no sign of DVT Wound: stable sternum  Lab Results: CBC: Basename 08/06/11 0400 08/05/11 1845  WBC 11.7* 10.3  HGB 11.0* 11.3*  HCT 31.5* 31.9*  PLT 73* 73*   BMET:  Basename 08/06/11 0400 08/05/11 1845 08/05/11 1832  NA 137 -- 141  K 3.8 -- 4.1  CL 106 -- 107  CO2 24 -- --  GLUCOSE 122* -- 148*  BUN 15 -- 13  CREATININE 0.77 0.34* --  CALCIUM 7.2* -- --    PT/INR:  Basename 08/05/11 1250  LABPROT 19.4*  INR 1.61*   Radiology: Dg Chest Portable 1 View  08/05/2011  *RADIOLOGY REPORT*  Clinical Data: Postop from aortic valve replacement.  Previous myocardial infarct and hypertension.  PORTABLE CHEST - 1 VIEW  Comparison: 08/01/2011  Findings: The patient has undergone interval median sternotomy and aortic valve replacement.  Swan-Ganz catheter is seen in the proximal right pulmonary artery.  A thin linear lucency is seen along the right lateral chest wall which may represent a tiny right pneumothorax.  Endotracheal tube, nasogastric tube and mediastinal drains are seen in expected position.  Low lung volumes are seen with mild left basilar atelectasis. Lungs otherwise clear.  Heart size is stable allowing for decreased lung volumes.  IMPRESSION:  1.  Postoperative chest with questionable tiny right lateral pneumothorax. 2.  Low lung volumes with mild left basilar atelectasis.  Original Report Authenticated By: Danae Orleans, M.D.     Assessment/Plan: S/P Procedure(s) (LRB): AORTIC VALVE REPLACEMENT (AVR) (N/A) Mobilize Diuresis Diabetes control- start lantis and wean off insulin drip d/c tubes/lines Continue foley due to patient in ICU and urinary output monitoring See progression orders Acute expected postop blood loss anemias no transfusion needed thrombocytopenia avoid heparin now    [name] MD  Beeper [phone] Office  [phone] 08/06/2011 8:39 AM

## 2011-08-07 ENCOUNTER — Encounter (HOSPITAL_COMMUNITY): Payer: Self-pay | Admitting: Cardiothoracic Surgery

## 2011-08-07 ENCOUNTER — Inpatient Hospital Stay (HOSPITAL_COMMUNITY): Payer: BC Managed Care – PPO

## 2011-08-07 LAB — BASIC METABOLIC PANEL
BUN: 20 mg/dL (ref 6–23)
CO2: 25 mEq/L (ref 19–32)
Calcium: 7.6 mg/dL — ABNORMAL LOW (ref 8.4–10.5)
Chloride: 101 mEq/L (ref 96–112)
Creatinine, Ser: 0.84 mg/dL (ref 0.50–1.35)
GFR calc Af Amer: >90 mL/min (ref >90)
GFR calc non Af Amer: >90 mL/min (ref >90)
Glucose, Bld: 137 mg/dL — ABNORMAL HIGH (ref 70–99)
Potassium: 4 mEq/L (ref 3.5–5.1)
Sodium: 133 mEq/L — ABNORMAL LOW (ref 135–145)

## 2011-08-07 LAB — CBC
HCT: 29.9 % — ABNORMAL LOW (ref 39.0–52.0)
Hemoglobin: 10.6 g/dL — ABNORMAL LOW (ref 13.0–17.0)
MCH: 33.8 pg (ref 26.0–34.0)
MCHC: 35.5 g/dL (ref 30.0–36.0)
MCV: 95.2 fL (ref 78.0–100.0)
Platelets: 62 10*3/uL — ABNORMAL LOW (ref 150–400)
RBC: 3.14 MIL/uL — ABNORMAL LOW (ref 4.22–5.81)
RDW: 12.8 % (ref 11.5–15.5)
WBC: 14.3 10*3/uL — ABNORMAL HIGH (ref 4.0–10.5)

## 2011-08-07 LAB — GLUCOSE, CAPILLARY
Glucose-Capillary: 138 mg/dL — ABNORMAL HIGH (ref 70–99)
Glucose-Capillary: 142 mg/dL — ABNORMAL HIGH (ref 70–99)
Glucose-Capillary: 164 mg/dL — ABNORMAL HIGH (ref 70–99)
Glucose-Capillary: 101 mg/dL — ABNORMAL HIGH (ref 70–99)
Glucose-Capillary: 103 mg/dL — ABNORMAL HIGH (ref 70–99)

## 2011-08-07 IMAGING — CR DG CHEST 1V PORT
1 series · 1 of 1 positions shown · non-contrast
Comparison: Portable chest x-ray of [DATE]

CLINICAL DATA: Postop open heart surgery

PORTABLE CHEST - 1 VIEW

[AP]
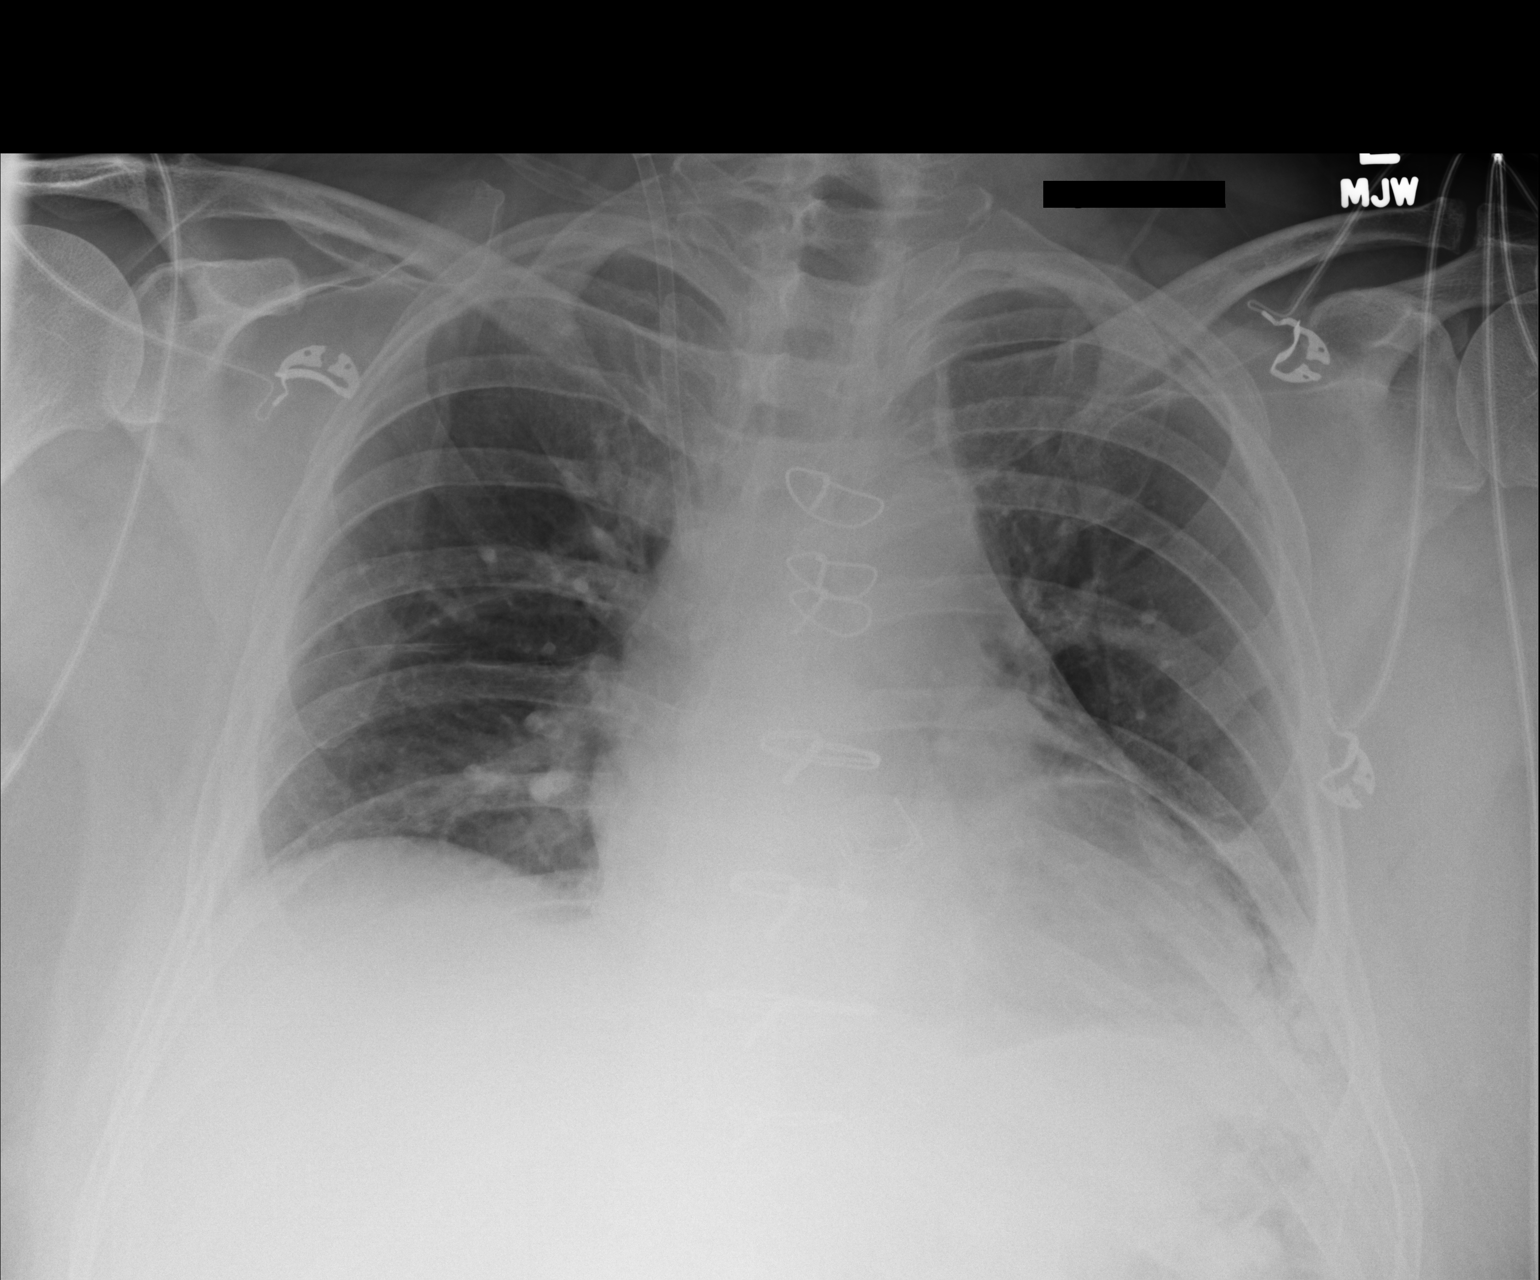

[1 of 1 positions shown; findings below may reference images not displayed]

FINDINGS: The Swan-Ganz catheter has been removed with a venous
sheath remaining in the SVC.  The lungs are not as well aerated
with increase in basilar atelectasis.  Cardiomegaly is stable.
Median sternotomy sutures are noted.
IMPRESSION: Swan-Ganz catheter removed.  Decrease in aeration.

## 2011-08-07 MED ORDER — MOVING RIGHT ALONG BOOK
Freq: Once | Status: AC
  Administered 2011-08-07: 09:00:00
  Filled 2011-08-07: qty 1

## 2011-08-07 MED ORDER — INSULIN ASPART 100 UNIT/ML ~~LOC~~ SOLN
0.0000 [IU] | Freq: Three times a day (TID) | SUBCUTANEOUS | Status: DC
  Administered 2011-08-07 – 2011-08-08 (×2): 4 [IU] via SUBCUTANEOUS
  Administered 2011-08-08: 2 [IU] via SUBCUTANEOUS

## 2011-08-07 MED ORDER — METOPROLOL TARTRATE 25 MG PO TABS
25.0000 mg | ORAL_TABLET | Freq: Two times a day (BID) | ORAL | Status: DC
  Administered 2011-08-07 – 2011-08-10 (×8): 25 mg via ORAL
  Filled 2011-08-07 (×11): qty 1

## 2011-08-07 MED ORDER — TRAMADOL HCL 50 MG PO TABS
50.0000 mg | ORAL_TABLET | ORAL | Status: DC | PRN
  Administered 2011-08-08: 100 mg via ORAL
  Filled 2011-08-07: qty 2

## 2011-08-07 MED ORDER — OXYCODONE HCL 5 MG PO TABS
5.0000 mg | ORAL_TABLET | ORAL | Status: DC | PRN
  Administered 2011-08-07: 5 mg via ORAL
  Administered 2011-08-08 – 2011-08-10 (×8): 10 mg via ORAL
  Administered 2011-08-11: 5 mg via ORAL
  Filled 2011-08-07 (×6): qty 2
  Filled 2011-08-07: qty 1
  Filled 2011-08-07 (×4): qty 2

## 2011-08-07 MED ORDER — SODIUM CHLORIDE 0.9 % IV SOLN: 250.0000 mL | INTRAVENOUS | Status: DC | PRN

## 2011-08-07 MED ORDER — SODIUM CHLORIDE 0.9 % IJ SOLN
3.0000 mL | Freq: Two times a day (BID) | INTRAMUSCULAR | Status: DC
  Administered 2011-08-07 – 2011-08-10 (×8): 3 mL via INTRAVENOUS

## 2011-08-07 MED ORDER — METFORMIN HCL 500 MG PO TABS
1000.0000 mg | ORAL_TABLET | Freq: Two times a day (BID) | ORAL | Status: DC
  Administered 2011-08-07 – 2011-08-10 (×7): 1000 mg via ORAL
  Filled 2011-08-07 (×11): qty 2

## 2011-08-07 MED ORDER — GUAIFENESIN ER 600 MG PO TB12
600.0000 mg | ORAL_TABLET | Freq: Two times a day (BID) | ORAL | Status: DC | PRN
  Filled 2011-08-07: qty 1

## 2011-08-07 MED ORDER — GLIPIZIDE ER 10 MG PO TB24
10.0000 mg | ORAL_TABLET | Freq: Every day | ORAL | Status: DC
  Administered 2011-08-07 – 2011-08-10 (×4): 10 mg via ORAL
  Filled 2011-08-07 (×6): qty 1

## 2011-08-07 MED ORDER — FUROSEMIDE 40 MG PO TABS
40.0000 mg | ORAL_TABLET | Freq: Every day | ORAL | Status: AC
  Administered 2011-08-07 – 2011-08-09 (×3): 40 mg via ORAL
  Filled 2011-08-07 (×4): qty 1

## 2011-08-07 MED ORDER — ASPIRIN EC 325 MG PO TBEC
325.0000 mg | DELAYED_RELEASE_TABLET | Freq: Every day | ORAL | Status: DC
  Administered 2011-08-07 – 2011-08-10 (×4): 325 mg via ORAL
  Filled 2011-08-07 (×6): qty 1

## 2011-08-07 MED ORDER — SODIUM CHLORIDE 0.9 % IJ SOLN: 3.0000 mL | INTRAMUSCULAR | Status: DC | PRN

## 2011-08-07 MED ORDER — PANTOPRAZOLE SODIUM 40 MG PO TBEC
40.0000 mg | DELAYED_RELEASE_TABLET | Freq: Every day | ORAL | Status: DC
  Administered 2011-08-08 – 2011-08-11 (×4): 40 mg via ORAL
  Filled 2011-08-07 (×5): qty 1

## 2011-08-07 MED ORDER — BISACODYL 5 MG PO TBEC: 10.0000 mg | DELAYED_RELEASE_TABLET | Freq: Every day | ORAL | Status: DC | PRN

## 2011-08-07 MED ORDER — BISACODYL 10 MG RE SUPP: 10.0000 mg | Freq: Every day | RECTAL | Status: DC | PRN

## 2011-08-07 MED ORDER — DOCUSATE SODIUM 100 MG PO CAPS
200.0000 mg | ORAL_CAPSULE | Freq: Every day | ORAL | Status: DC
  Administered 2011-08-07 – 2011-08-10 (×2): 200 mg via ORAL
  Filled 2011-08-07 (×3): qty 2

## 2011-08-07 MED FILL — Heparin Sodium (Porcine) Inj 1000 Unit/ML: INTRAMUSCULAR | Qty: 10 | Status: AC

## 2011-08-07 MED FILL — Heparin Sodium (Porcine) Inj 1000 Unit/ML: INTRAMUSCULAR | Qty: 30 | Status: AC

## 2011-08-07 MED FILL — Lidocaine HCl IV Inj 20 MG/ML: INTRAVENOUS | Qty: 5 | Status: AC

## 2011-08-07 MED FILL — Mannitol IV Soln 20%: INTRAVENOUS | Qty: 500 | Status: AC

## 2011-08-07 MED FILL — Sodium Chloride Irrigation Soln 0.9%: Qty: 3000 | Status: AC

## 2011-08-07 MED FILL — Sodium Chloride IV Soln 0.9%: INTRAVENOUS | Qty: 1000 | Status: AC

## 2011-08-07 MED FILL — Electrolyte-R (PH 7.4) Solution: INTRAVENOUS | Qty: 4000 | Status: AC

## 2011-08-07 MED FILL — Sodium Bicarbonate IV Soln 8.4%: INTRAVENOUS | Qty: 50 | Status: AC

## 2011-08-07 NOTE — Progress Notes (Signed)
Patient ID: Darrell [name], male   DOB: 10-Aug-1950, 61 y.o.   MRN: [id number] TCTS DAILY PROGRESS NOTE                   [address]            [place] [address]          [phone]      2 Days Post-Op Procedure(s) (LRB): AORTIC VALVE REPLACEMENT (AVR) (N/A)  Total Length of Stay:  LOS: 2 days   Subjective: Ambulating well, good pain control  Objective: Vital signs in last 24 hours: Temp:  [98.2 F (36.8 C)-99.1 F (37.3 C)] 98.7 F (37.1 C) (06/13 0808) Pulse Rate:  [84-96] 92  (06/13 0800) Cardiac Rhythm:  [-] Normal sinus rhythm (06/13 0800) Resp:  [12-22] 12  (06/13 0800) BP: (93-134)/(43-83) 117/54 mmHg (06/13 0800) SpO2:  [91 %-99 %] 95 % (06/13 0800) Arterial Line BP: (145)/(63) 145/63 mmHg (06/12 0900) Weight:  [209 lb 10.5 oz (95.1 kg)] 209 lb 10.5 oz (95.1 kg) (06/13 0500)  Filed Weights   08/05/11 1245 08/06/11 0500 08/07/11 0500  Weight: 201 lb 15.1 oz (91.6 kg) 210 lb 5.1 oz (95.4 kg) 209 lb 10.5 oz (95.1 kg)    Weight change: 7 lb 11.5 oz (3.5 kg)   Hemodynamic parameters for last 24 hours: PAP: (41)/(21) 41/21 mmHg  Intake/Output from previous day: 06/12 0701 - 06/13 0700 In: 1051.8 [P.O.:480; I.V.:471.8; IV Piggyback:100] Out: 785 [Urine:785]  Intake/Output this shift: Total I/O In: 20 [I.V.:20] Out: 15 [Urine:15]  Current Meds: Scheduled Meds:   . acetaminophen  1,000 mg Oral Q6H   Or  . acetaminophen (TYLENOL) oral liquid 160 mg/5 mL  975 mg Per Tube Q6H  . aspirin EC  325 mg Oral Daily   Or  . aspirin  324 mg Per Tube Daily  . atorvastatin  40 mg Oral q1800  . bisacodyl  10 mg Oral Daily   Or  . bisacodyl  10 mg Rectal Daily  . cefUROXime (ZINACEF)  IV  1.5 g Intravenous Q12H  . docusate sodium  200 mg Oral Daily  . famotidine (PEPCID) IV  20 mg Intravenous Q12H  . insulin aspart  0-24 Units Subcutaneous Q4H  . insulin glargine  25 Units Subcutaneous Daily  . insulin regular  0-10 Units Intravenous TID WC  .  lisinopril  2.5 mg Oral Daily  . metoprolol tartrate  25 mg Oral BID   Or  . metoprolol tartrate  12.5 mg Per Tube BID  . multivitamin with minerals  1 tablet Oral Daily  . pantoprazole  40 mg Oral Q1200  . sodium chloride  3 mL Intravenous Q12H  . DISCONTD: metoprolol tartrate  12.5 mg Per Tube BID  . DISCONTD: metoprolol tartrate  12.5 mg Oral BID   Continuous Infusions:   . sodium chloride 20 mL/hr (08/07/11 0209)  . sodium chloride Stopped (08/05/11 1245)  . sodium chloride    . dexmedetomidine (PRECEDEX) IV infusion Stopped (08/05/11 1530)  . lactated ringers 20 mL/hr at 08/05/11 1846  . nitroGLYCERIN Stopped (08/05/11 1245)  . phenylephrine (NEO-SYNEPHRINE) Adult infusion Stopped (08/05/11 1600)  . DISCONTD: insulin (NOVOLIN-R) infusion Stopped (08/06/11 1200)   PRN Meds:.albumin human, midazolam, morphine injection, ondansetron (ZOFRAN) IV, oxyCODONE, sodium chloride, DISCONTD: metoprolol  General appearance: alert, cooperative and no distress Neurologic: intact Heart: regular rate and rhythm, S1, S2 normal, no murmur, click, rub or gallop and normal apical impulse Lungs: clear  to auscultation bilaterally Abdomen: soft, non-tender; bowel sounds normal; no masses,  no organomegaly Extremities: extremities normal, atraumatic, no cyanosis or edema and Homans sign is negative, no sign of DVT Wound: intact sternum, no erythermia on lower legs  Lab Results: CBC: Basename 08/07/11 0400 08/06/11 1645  WBC 14.3* 13.8*  HGB 10.6* 11.0*  HCT 29.9* 31.4*  PLT 62* 64*   BMET:  Basename 08/07/11 0400 08/06/11 1645 08/06/11 1631 08/06/11 0400  NA 133* -- 138 --  K 4.0 -- 4.0 --  CL 101 -- 102 --  CO2 25 -- -- 24  GLUCOSE 137* -- 133* --  BUN 20 -- 16 --  CREATININE 0.84 0.69 -- --  CALCIUM 7.6* -- -- 7.2*    PT/INR:  Basename 08/05/11 1250  LABPROT 19.4*  INR 1.61*   Radiology: Dg Chest Portable 1 View In Am  08/07/2011  *RADIOLOGY REPORT*  Clinical Data: Postop open  heart surgery  PORTABLE CHEST - 1 VIEW  Comparison: Portable chest x-ray of 08/06/2011  Findings: The Swan-Ganz catheter has been removed with a venous sheath remaining in the SVC.  The lungs are not as well aerated with increase in basilar atelectasis.  Cardiomegaly is stable. Median sternotomy sutures are noted.  IMPRESSION: Swan-Ganz catheter removed.  Decrease in aeration.  Original Report Authenticated By: Juline Patch, M.D.   Dg Chest Portable 1 View In Am  08/06/2011  *RADIOLOGY REPORT*  Clinical Data: Postop  PORTABLE CHEST - 1 VIEW  Comparison: 08/05/2011  Findings: Cardiomediastinal silhouette is stable.  Status post median sternotomy again noted.  Stable left basilar atelectasis. Stable right Swan-Ganz catheter position.  No acute infiltrate or pulmonary edema.  No diagnostic pneumothorax.  IMPRESSION: Status post median sternotomy again noted.  Stable left basilar atelectasis.  Stable right Swan-Ganz catheter position.  No acute infiltrate or pulmonary edema.  No diagnostic pneumothorax.  Original Report Authenticated By: Natasha Mead, M.D.   Dg Chest Portable 1 View  08/05/2011  *RADIOLOGY REPORT*  Clinical Data: Postop from aortic valve replacement.  Previous myocardial infarct and hypertension.  PORTABLE CHEST - 1 VIEW  Comparison: 08/01/2011  Findings: The patient has undergone interval median sternotomy and aortic valve replacement.  Swan-Ganz catheter is seen in the proximal right pulmonary artery.  A thin linear lucency is seen along the right lateral chest wall which may represent a tiny right pneumothorax.  Endotracheal tube, nasogastric tube and mediastinal drains are seen in expected position.  Low lung volumes are seen with mild left basilar atelectasis. Lungs otherwise clear.  Heart size is stable allowing for decreased lung volumes.  IMPRESSION:  1.  Postoperative chest with questionable tiny right lateral pneumothorax. 2.  Low lung volumes with mild left basilar atelectasis.  Original  Report Authenticated By: Danae Orleans, M.D.     Assessment/Plan: S/P Procedure(s) (LRB): AORTIC VALVE REPLACEMENT (AVR) (N/A) Mobilize Diuresis Diabetes control Plan for transfer to step-down: see transfer orders Avoid heparin with continued thrombocytopenia      [name] MD  Beeper [phone] Office [phone] 08/07/2011 8:41 AM

## 2011-08-07 NOTE — Progress Notes (Signed)
Patient ID: Darrell [name], male   DOB: 09/14/1950, 61 y.o.   MRN: [id number]                   [address]            [place] [address]          [phone]     2 Days Post-Op Procedure(s) (LRB): AORTIC VALVE REPLACEMENT (AVR) (N/A)  Total Length of Stay:  LOS: 2 days  BP 118/50  Pulse 99  Temp 98.4 F (36.9 C) (Oral)  Resp 23  Ht 5\' 9"  (1.753 m)  Wt 209 lb 10.5 oz (95.1 kg)  BMI 30.96 kg/m2  SpO2 92%     . DISCONTD: sodium chloride 20 mL/hr (08/07/11 0209)  . DISCONTD: sodium chloride Stopped (08/05/11 1245)  . DISCONTD: sodium chloride    . DISCONTD: dexmedetomidine (PRECEDEX) IV infusion Stopped (08/05/11 1530)  . DISCONTD: insulin (NOVOLIN-R) infusion Stopped (08/06/11 1200)  . DISCONTD: lactated ringers 20 mL/hr at 08/05/11 1846  . DISCONTD: nitroGLYCERIN Stopped (08/05/11 1245)  . DISCONTD: phenylephrine (NEO-SYNEPHRINE) Adult infusion Stopped (08/05/11 1600)     Lab Results  Component Value Date   WBC 14.3* 08/07/2011   HGB 10.6* 08/07/2011   HCT 29.9* 08/07/2011   PLT 62* 08/07/2011   GLUCOSE 137* 08/07/2011   ALT 20 08/01/2011   AST 16 08/01/2011   NA 133* 08/07/2011   K 4.0 08/07/2011   CL 101 08/07/2011   CREATININE 0.84 08/07/2011   BUN 20 08/07/2011   CO2 25 08/07/2011   INR 1.61* 08/05/2011   HGBA1C 6.0* 08/01/2011   Walking well waiting for 2000 bed  [name] MD  Beeper [phone] Office [phone] 08/07/2011 7:16 PM

## 2011-08-08 ENCOUNTER — Inpatient Hospital Stay (HOSPITAL_COMMUNITY): Payer: BC Managed Care – PPO

## 2011-08-08 LAB — CBC
HCT: 30.3 % — ABNORMAL LOW (ref 39.0–52.0)
Hemoglobin: 10.5 g/dL — ABNORMAL LOW (ref 13.0–17.0)
MCH: 32.9 pg (ref 26.0–34.0)
MCHC: 34.7 g/dL (ref 30.0–36.0)
MCV: 95 fL (ref 78.0–100.0)
Platelets: 72 10*3/uL — ABNORMAL LOW (ref 150–400)
RBC: 3.19 MIL/uL — ABNORMAL LOW (ref 4.22–5.81)
RDW: 12.7 % (ref 11.5–15.5)
WBC: 10.3 10*3/uL (ref 4.0–10.5)

## 2011-08-08 LAB — BASIC METABOLIC PANEL
BUN: 23 mg/dL (ref 6–23)
CO2: 25 mEq/L (ref 19–32)
Calcium: 7.7 mg/dL — ABNORMAL LOW (ref 8.4–10.5)
Chloride: 99 mEq/L (ref 96–112)
Creatinine, Ser: 0.84 mg/dL (ref 0.50–1.35)
GFR calc Af Amer: >90 mL/min (ref >90)
GFR calc non Af Amer: >90 mL/min (ref >90)
Glucose, Bld: 125 mg/dL — ABNORMAL HIGH (ref 70–99)
Potassium: 3.7 mEq/L (ref 3.5–5.1)
Sodium: 134 mEq/L — ABNORMAL LOW (ref 135–145)

## 2011-08-08 LAB — TSH: TSH: 2.525 u[IU]/mL (ref 0.350–4.500)

## 2011-08-08 LAB — GLUCOSE, CAPILLARY
Glucose-Capillary: 155 mg/dL — ABNORMAL HIGH (ref 70–99)
Glucose-Capillary: 165 mg/dL — ABNORMAL HIGH (ref 70–99)
Glucose-Capillary: 78 mg/dL (ref 70–99)

## 2011-08-08 IMAGING — CR DG CHEST 1V PORT
1 series · 1 of 1 positions shown · non-contrast
Comparison: Portable chest x-ray of [DATE]

CLINICAL DATA: Status post aortic valve replacement

PORTABLE CHEST - 1 VIEW

[AP]
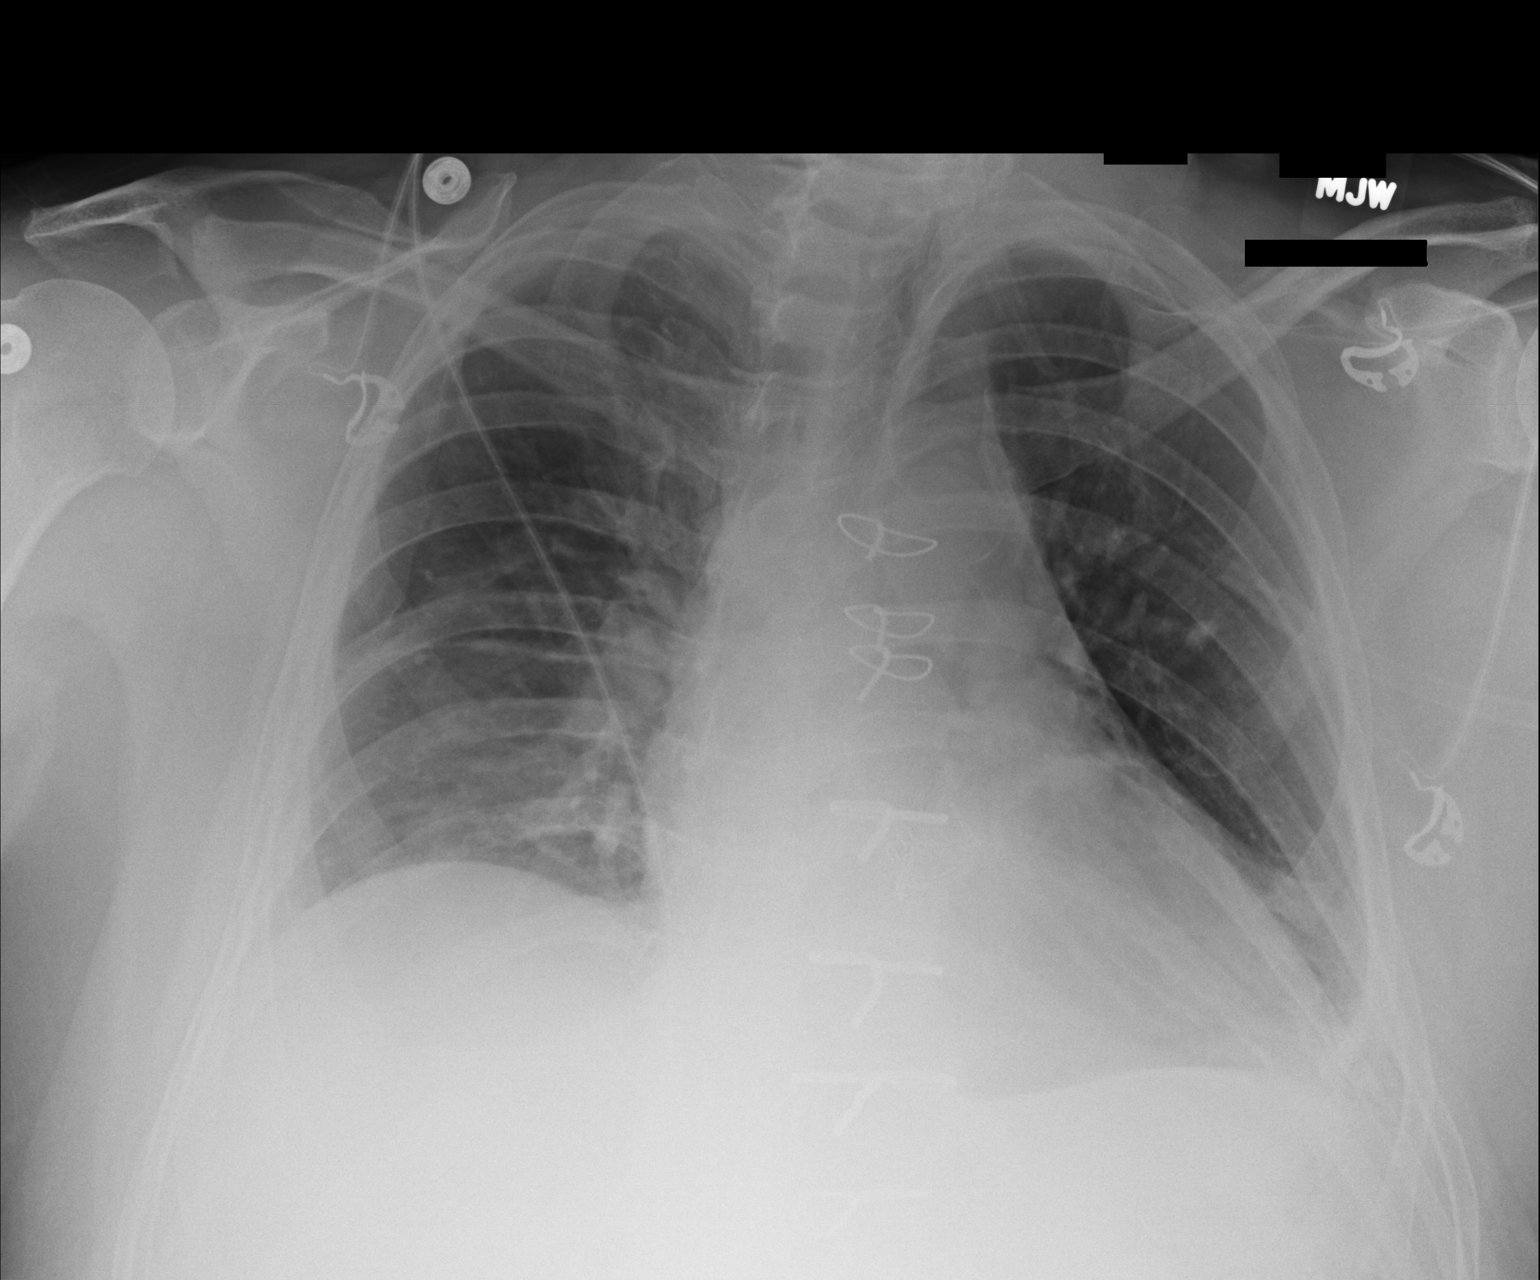

[1 of 1 positions shown; findings below may reference images not displayed]

FINDINGS: The venous sheath has been removed from the SVC and no
pneumothorax is seen.  Basilar linear atelectasis remains and
cardiomegaly is stable.
IMPRESSION: No change in bibasilar atelectasis.  Venous sheath removed from
SVC.

## 2011-08-08 MED ORDER — AMIODARONE HCL IN DEXTROSE 360-4.14 MG/200ML-% IV SOLN
INTRAVENOUS | Status: AC
  Administered 2011-08-08: 1 mg/min via INTRAVENOUS
  Filled 2011-08-08: qty 200

## 2011-08-08 MED ORDER — POTASSIUM CHLORIDE 20 MEQ/15ML (10%) PO LIQD: 20.0000 meq | Freq: Every day | ORAL | Status: DC

## 2011-08-08 MED ORDER — AMIODARONE HCL 200 MG PO TABS
400.0000 mg | ORAL_TABLET | Freq: Two times a day (BID) | ORAL | Status: DC
  Administered 2011-08-08 – 2011-08-10 (×6): 400 mg via ORAL
  Filled 2011-08-08 (×9): qty 2

## 2011-08-08 MED ORDER — AMIODARONE HCL IN DEXTROSE 360-4.14 MG/200ML-% IV SOLN
0.5000 mg/min | INTRAVENOUS | Status: DC
  Filled 2011-08-08: qty 200

## 2011-08-08 MED ORDER — POTASSIUM CHLORIDE CRYS ER 20 MEQ PO TBCR
20.0000 meq | EXTENDED_RELEASE_TABLET | Freq: Every day | ORAL | Status: DC
  Administered 2011-08-08: 20 meq via ORAL
  Filled 2011-08-08 (×2): qty 1

## 2011-08-08 MED ORDER — AMIODARONE LOAD VIA INFUSION
150.0000 mg | Freq: Once | INTRAVENOUS | Status: AC
  Administered 2011-08-08: 150 mg via INTRAVENOUS
  Filled 2011-08-08: qty 83.34

## 2011-08-08 MED ORDER — AMIODARONE HCL IN DEXTROSE 360-4.14 MG/200ML-% IV SOLN
1.0000 mg/min | INTRAVENOUS | Status: DC
  Administered 2011-08-08: 1 mg/min via INTRAVENOUS
  Filled 2011-08-08: qty 200

## 2011-08-08 MED ORDER — AMIODARONE HCL 200 MG PO TABS: 400.0000 mg | ORAL_TABLET | Freq: Every day | ORAL | Status: DC

## 2011-08-08 NOTE — Plan of Care (Signed)
Problem: Phase III Progression Outcomes Goal: Time patient transferred to PCTU/Telemetry POD Outcome: Completed/Met Date Met:  08/08/11 1530pm, transferred to 2016 safely, settled in chair, receiving RN Shanda Bumps in the room. VS stable prior and during the transfer. Pt only belongings are dentures which patient is wearing. Pt family aware of transfer. Per patient, stated he will call spouse to let her know of his new room number. Savera Donson, Charity fundraiser.

## 2011-08-08 NOTE — Progress Notes (Signed)
Pt HR sustained 150-200s. BP 126/94. Pt symptomatic. Dr Tyrone Sage made aware. New orders received.

## 2011-08-08 NOTE — Progress Notes (Signed)
Pt HR 150-190s. BP 148/77. Dr Tyrone Sage made aware. New orders received. Will continue to monitor closely.

## 2011-08-08 NOTE — Progress Notes (Signed)
Patient ID: Darrell [name], male   DOB: 1950/12/16, 61 y.o.   MRN: [id number] TCTS DAILY PROGRESS NOTE                   [address]            [place] [address]          [phone]      3 Days Post-Op Procedure(s) (LRB): AORTIC VALVE REPLACEMENT (AVR) (N/A)  Total Length of Stay:  LOS: 3 days   Subjective: Developed afib during night, now converted Objective: Vital signs in last 24 hours: Temp:  [97.7 F (36.5 C)-100.5 F (38.1 C)] 97.7 F (36.5 C) (06/14 0721) Pulse Rate:  [44-176] 81  (06/14 0700) Cardiac Rhythm:  [-] Normal sinus rhythm (06/14 0600) Resp:  [12-23] 19  (06/14 0700) BP: (95-148)/(49-94) 127/59 mmHg (06/14 0700) SpO2:  [90 %-98 %] 98 % (06/14 0700) Weight:  [208 lb 1.8 oz (94.4 kg)] 208 lb 1.8 oz (94.4 kg) (06/14 0300)  Filed Weights   08/06/11 0500 08/07/11 0500 08/08/11 0300  Weight: 210 lb 5.1 oz (95.4 kg) 209 lb 10.5 oz (95.1 kg) 208 lb 1.8 oz (94.4 kg)    Weight change: -1 lb 8.7 oz (-0.7 kg)   Hemodynamic parameters for last 24 hours:    Intake/Output from previous day: 06/13 0701 - 06/14 0700 In: 339.9 [P.O.:120; I.V.:219.9] Out: 1236 [Urine:1235; Stool:1]  Intake/Output this shift:    Current Meds: Scheduled Meds:   . amiodarone  150 mg Intravenous Once  . aspirin EC  325 mg Oral Daily  . atorvastatin  40 mg Oral q1800  . docusate sodium  200 mg Oral Daily  . furosemide  40 mg Oral Daily  . glipiZIDE  10 mg Oral Daily  . insulin aspart  0-24 Units Subcutaneous TID AC & HS  . lisinopril  2.5 mg Oral Daily  . metFORMIN  1,000 mg Oral BID WC  . metoprolol tartrate  25 mg Oral BID  . moving right along book   Does not apply Once  . pantoprazole  40 mg Oral QAC breakfast  . sodium chloride  3 mL Intravenous Q12H  Continuous Infusions:   . amiodarone (NEXTERONE PREMIX) 360 mg/200 mL dextrose 1 mg/min (08/08/11 0500)   Followed by  . amiodarone (NEXTERONE PREMIX) 360 mg/200 mL dextrose     PRN Meds:.sodium  chloride, bisacodyl, bisacodyl, guaiFENesin, oxyCODONE, sodium chloride, traMADol, DISCONTD: midazolam, DISCONTD:  morphine injection, DISCONTD: ondansetron (ZOFRAN) IV, DISCONTD: oxyCODONE, DISCONTD: sodium chloride  General appearance: alert, cooperative and fatigued Neurologic: intact Heart: regular rate and rhythm, S1, S2 normal, no murmur, click, rub or gallop and normal apical impulse Lungs: clear to auscultation bilaterally and normal percussion bilaterally Abdomen: soft, non-tender; bowel sounds normal; no masses,  no organomegaly Extremities: extremities normal, atraumatic, no cyanosis or edema and Homans sign is negative, no sign of DVT Wound: sternum intact  Lab Results: CBC: Basename 08/08/11 0341 08/07/11 0400  WBC 10.3 14.3*  HGB 10.5* 10.6*  HCT 30.3* 29.9*  PLT 72* 62*   BMET:  Basename 08/08/11 0341 08/07/11 0400  NA 134* 133*  K 3.7 4.0  CL 99 101  CO2 25 25  GLUCOSE 125* 137*  BUN 23 20  CREATININE 0.84 0.84  CALCIUM 7.7* 7.6*    PT/INR:  Basename 08/05/11 1250  LABPROT 19.4*  INR 1.61*   Radiology: Dg Chest Portable 1 View In Am  08/07/2011  *RADIOLOGY REPORT*  Clinical  Data: Postop open heart surgery  PORTABLE CHEST - 1 VIEW  Comparison: Portable chest x-ray of 08/06/2011  Findings: The Swan-Ganz catheter has been removed with a venous sheath remaining in the SVC.  The lungs are not as well aerated with increase in basilar atelectasis.  Cardiomegaly is stable. Median sternotomy sutures are noted.  IMPRESSION: Swan-Ganz catheter removed.  Decrease in aeration.  Original Report Authenticated By: Juline Patch, M.D.     Assessment/Plan: S/P Procedure(s) (LRB): AORTIC VALVE REPLACEMENT (AVR) (N/A) Mobilize Diuresis Diabetes control adquate control On Cordarone now for afib now converted     [name] MD  Beeper [phone] Office [phone] 08/08/2011 7:39 AM

## 2011-08-08 NOTE — Progress Notes (Signed)
Amiodarone Drug - Drug Interaction Consult Note  Recommendations: Amiodarone is metabolized by the cytochrome P450 system and therefore has the potential to cause many drug interactions. Amiodarone has an average plasma half-life of 50 days (range 20 to 100 days).   There is potential for drug interactions to occur several weeks or months after stopping treatment and the onset of drug interactions may be slow after initiating amiodarone.   [x]  Statins: Increased risk of myopathy. Simvastatin- restrict dose to 20mg  daily. Other statins: counsel patients to report any muscle pain or weakness immediately.  []  Anticoagulants: Amiodarone can increase anticoagulant effect. Consider warfarin dose reduction. Patients should be monitored closely and the dose of anticoagulant altered accordingly, remembering that amiodarone levels take several weeks to stabilize.  []  Antiepileptics: Amiodarone can increase plasma concentration of phenytoin, phenytoin dose should be reduced. Note that small changes in phenytoin dose can result in large changes in phenytoin levels. Monitor patient closely and counsel on signs of toxicity.  [x]  Beta blockers: increased risk of bradycardia, AV block and myocardial depression. Sotalol - avoid concomitant use.  []   Calcium channel blockers (diltiazem and verapamil): increased risk of bradycardia, AV block and myocardial depression.  []   Cyclosporine: Amiodarone increases levels of cyclosporine. Reduced dose of cyclosporine is recommended.  []  Digoxin dose should be halved when amiodarone is started.  [x]  Diuretics: increased risk of cardiotoxicity if hypokalemia occurs.  [x]  Oral hypoglycemic agents (glyburide, glipizide, glimepiride): increased risk of hypoglycemia. Patient's glucose levels should be monitored closely when initiating amiodarone therapy.   []  Drugs that prolong the QT interval: Concurrent therapy is contraindicated due to the increased risk of torsades de  pointes; . Antibiotics: e.g. fluoroquinolones, erythromycin. . Antiarrhythmics: e.g. quinidine, procainamide, disopyramide, sotalol. . Antipsychotics: e.g. phenothiazines, haloperidol.  . Lithium, tricyclic antidepressants, and methadone. Thank You,  Emeline Gins  08/08/2011 4:44 AM

## 2011-08-09 ENCOUNTER — Inpatient Hospital Stay (HOSPITAL_COMMUNITY): Payer: BC Managed Care – PPO

## 2011-08-09 LAB — GLUCOSE, CAPILLARY
Glucose-Capillary: 111 mg/dL — ABNORMAL HIGH (ref 70–99)
Glucose-Capillary: 140 mg/dL — ABNORMAL HIGH (ref 70–99)
Glucose-Capillary: 107 mg/dL — ABNORMAL HIGH (ref 70–99)
Glucose-Capillary: 120 mg/dL — ABNORMAL HIGH (ref 70–99)
Glucose-Capillary: 91 mg/dL (ref 70–99)

## 2011-08-09 LAB — BASIC METABOLIC PANEL
BUN: 21 mg/dL (ref 6–23)
CO2: 23 mEq/L (ref 19–32)
Calcium: 7.9 mg/dL — ABNORMAL LOW (ref 8.4–10.5)
Chloride: 98 mEq/L (ref 96–112)
Creatinine, Ser: 0.75 mg/dL (ref 0.50–1.35)
GFR calc Af Amer: >90 mL/min (ref >90)
GFR calc non Af Amer: >90 mL/min (ref >90)
Glucose, Bld: 132 mg/dL — ABNORMAL HIGH (ref 70–99)
Potassium: 3.6 mEq/L (ref 3.5–5.1)
Sodium: 135 mEq/L (ref 135–145)

## 2011-08-09 LAB — CBC
HCT: 32.1 % — ABNORMAL LOW (ref 39.0–52.0)
Hemoglobin: 11.1 g/dL — ABNORMAL LOW (ref 13.0–17.0)
MCH: 32.4 pg (ref 26.0–34.0)
MCHC: 34.6 g/dL (ref 30.0–36.0)
MCV: 93.6 fL (ref 78.0–100.0)
Platelets: 117 10*3/uL — ABNORMAL LOW (ref 150–400)
RBC: 3.43 MIL/uL — ABNORMAL LOW (ref 4.22–5.81)
RDW: 12.4 % (ref 11.5–15.5)
WBC: 10.2 10*3/uL (ref 4.0–10.5)

## 2011-08-09 LAB — MAGNESIUM: Magnesium: 2.5 mg/dL (ref 1.5–2.5)

## 2011-08-09 IMAGING — CR DG CHEST 2V
2 series · 2 of 2 positions shown · non-contrast
Comparison: [DATE]

CLINICAL DATA: follow up aortic valve replacement

CHEST - 2 VIEW

[w chest pa]
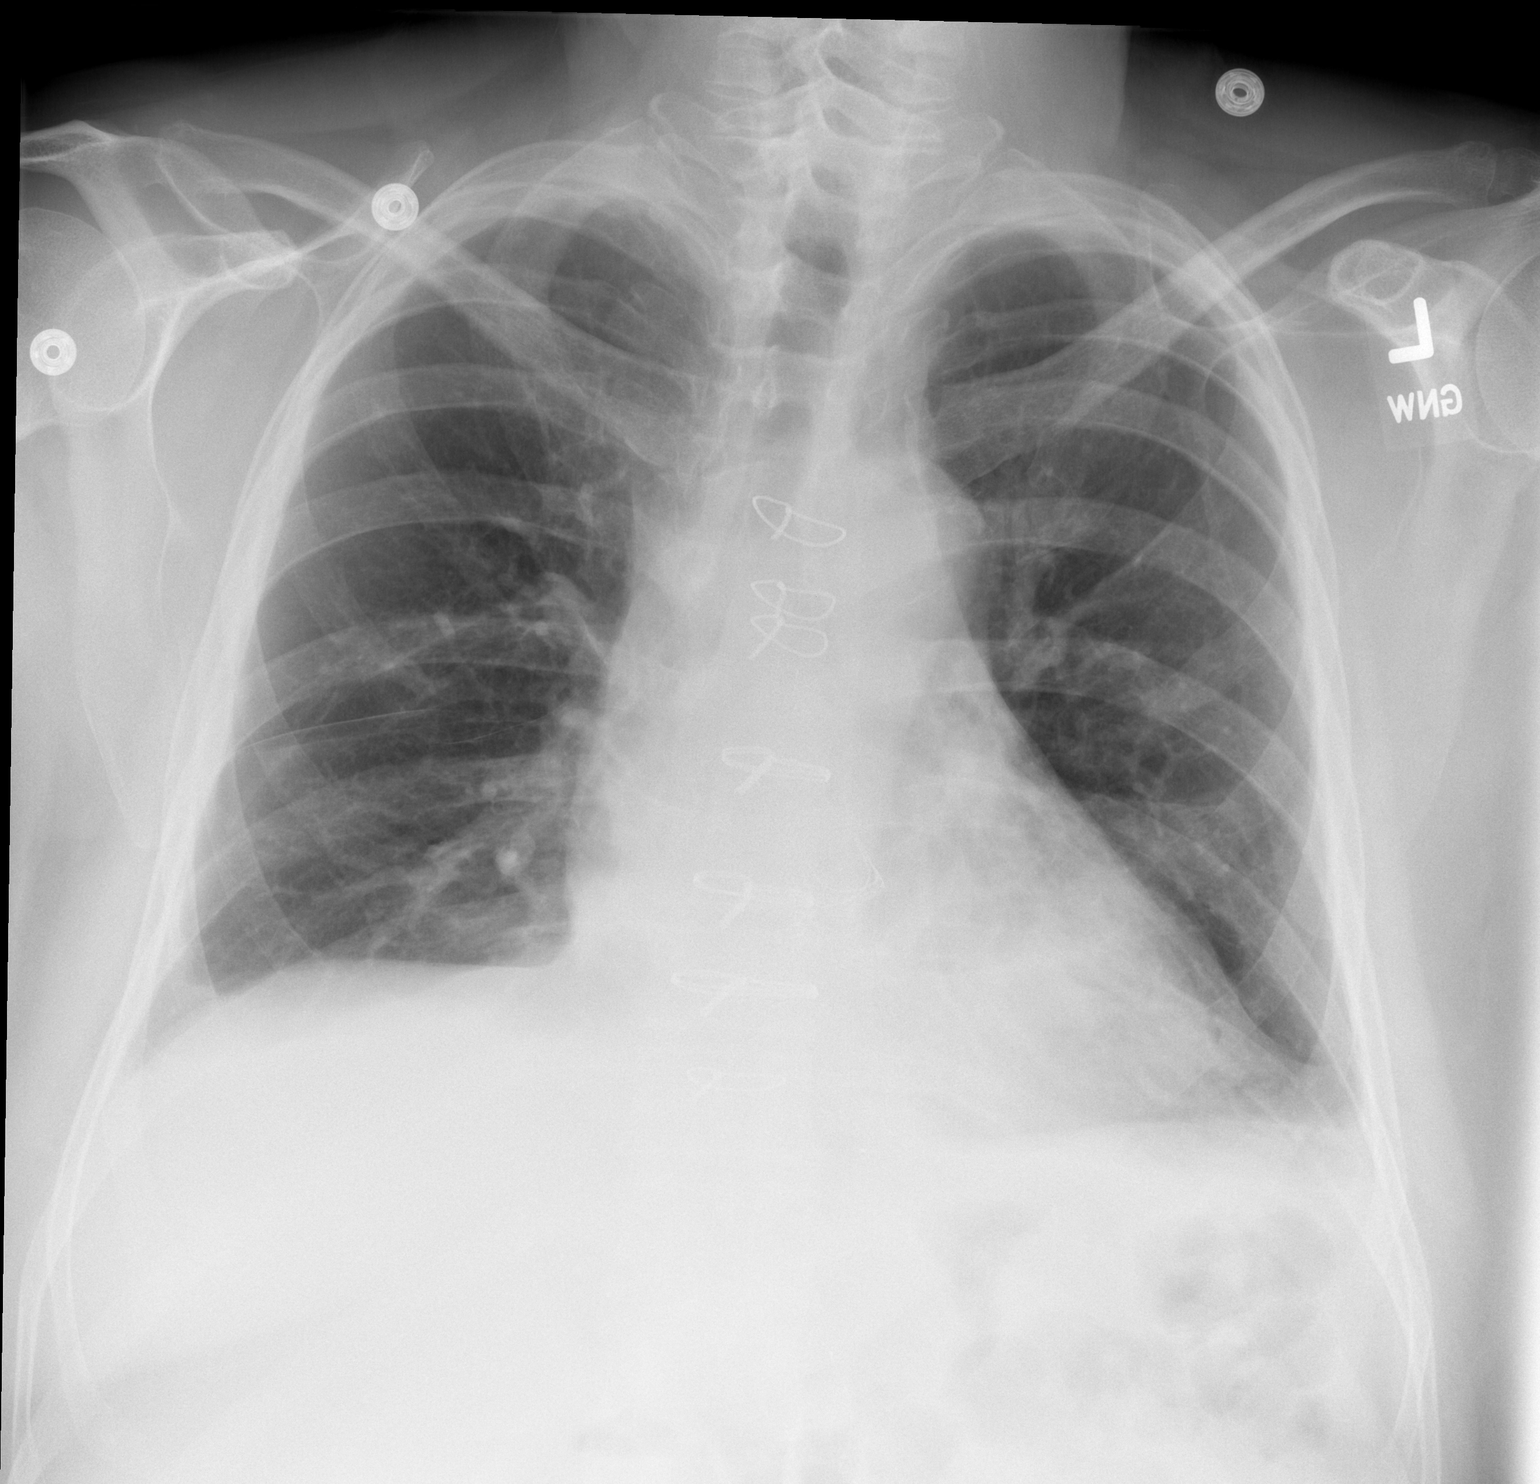

[w chest lat *]
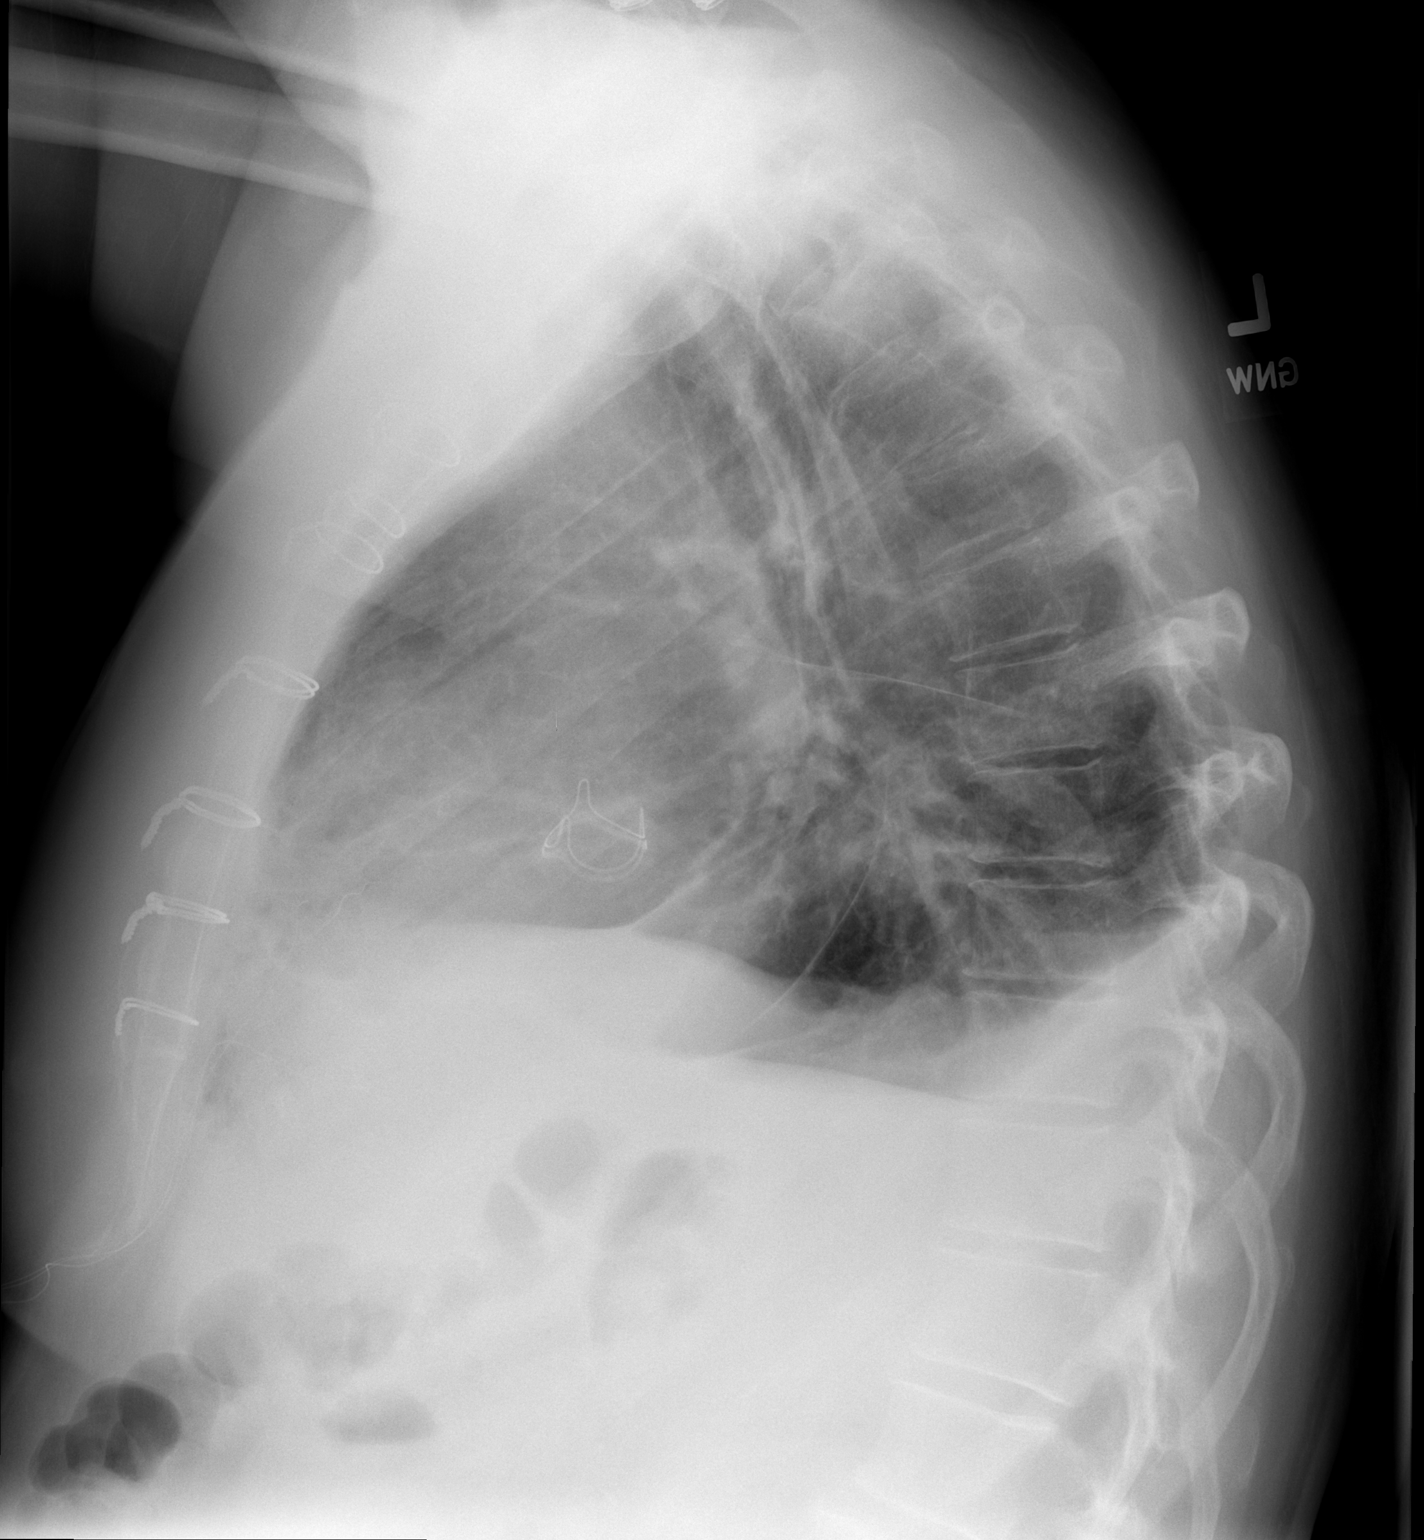

[2 of 2 positions shown; findings below may reference images not displayed]

FINDINGS: Prior median sternotomy and aortic valve replacement.
The heart size is mildly enlarged.

There are small bilateral pleural effusions and bibasilar
atelectasis.  No significant interstitial edema. This appears
similar to previous exam.
IMPRESSION: 1.  Small pleural effusions and bibasilar atelectasis.

## 2011-08-09 MED ORDER — POTASSIUM CHLORIDE CRYS ER 20 MEQ PO TBCR
40.0000 meq | EXTENDED_RELEASE_TABLET | Freq: Every day | ORAL | Status: AC
  Administered 2011-08-09 – 2011-08-10 (×2): 40 meq via ORAL
  Filled 2011-08-09: qty 2
  Filled 2011-08-09: qty 1
  Filled 2011-08-09: qty 2

## 2011-08-09 NOTE — Progress Notes (Addendum)
[address]            [place] [address]          [phone]     4 Days Post-Op  Procedure(s) (LRB): AORTIC VALVE REPLACEMENT (AVR) (N/A) Subjective: Feels poorly, weak  Objective  Telemetry SR  Temp:  [97.6 F (36.4 C)-98.5 F (36.9 C)] 98.2 F (36.8 C) (06/15 0402) Pulse Rate:  [71-91] 82  (06/15 0402) Resp:  [15-19] 18  (06/15 0402) BP: (95-130)/(56-85) 130/72 mmHg (06/15 0402) SpO2:  [93 %-96 %] 95 % (06/15 0402) Weight:  [205 lb 11 oz (93.3 kg)] 205 lb 11 oz (93.3 kg) (06/15 0402)   Intake/Output Summary (Last 24 hours) at 08/09/11 0922 Last data filed at 08/08/11 2115  Gross per 24 hour  Intake  79.93 ml  Output   1050 ml  Net -970.07 ml       General appearance: alert, cooperative and no distress Heart: regularly irregular rhythm, S1, S2 normal and soft systolic murmur Lungs: clear to auscultation bilaterally Abdomen: + BS, soft, nontender Extremities: trace edema Wound: incisions healing well  Lab Results:  Basename 08/09/11 0505 08/08/11 0341 08/06/11 1645  NA 135 134* --  K 3.6 3.7 --  CL 98 99 --  CO2 23 25 --  GLUCOSE 132* 125* --  BUN 21 23 --  CREATININE 0.75 0.84 --  CALCIUM 7.9* 7.7* --  MG 2.5 -- 2.2  PHOS -- -- --   No results found for this basename: AST:2,ALT:2,ALKPHOS:2,BILITOT:2,PROT:2,ALBUMIN:2 in the last 72 hours No results found for this basename: LIPASE:2,AMYLASE:2 in the last 72 hours  Basename 08/09/11 0505 08/08/11 0341  WBC 10.2 10.3  NEUTROABS -- --  HGB 11.1* 10.5*  HCT 32.1* 30.3*  MCV 93.6 95.0  PLT 117* 72*   No results found for this basename: CKTOTAL:4,CKMB:4,TROPONINI:4 in the last 72 hours No components found with this basename: POCBNP:3 No results found for this basename: DDIMER in the last 72 hours No results found for this basename: HGBA1C in the last 72 hours No results found for this basename: CHOL,HDL,LDLCALC,TRIG,CHOLHDL in the last 72 hours  Basename 08/08/11  1031  TSH 2.525  T4TOTAL --  T3FREE --  THYROIDAB --   No results found for this basename: VITAMINB12,FOLATE,FERRITIN,TIBC,IRON,RETICCTPCT in the last 72 hours  Medications: Scheduled    . amiodarone  400 mg Oral Q12H   Followed by  . amiodarone  400 mg Oral Daily  . aspirin EC  325 mg Oral Daily  . atorvastatin  40 mg Oral q1800  . docusate sodium  200 mg Oral Daily  . furosemide  40 mg Oral Daily  . glipiZIDE  10 mg Oral Daily  . insulin aspart  0-24 Units Subcutaneous TID AC & HS  . lisinopril  2.5 mg Oral Daily  . metFORMIN  1,000 mg Oral BID WC  . metoprolol tartrate  25 mg Oral BID  . pantoprazole  40 mg Oral QAC breakfast  . potassium chloride  20 mEq Oral Daily  . sodium chloride  3 mL Intravenous Q12H  . DISCONTD: potassium chloride  20 mEq Oral Daily     Radiology/Studies:  Dg Chest 2 View  08/09/2011  *RADIOLOGY REPORT*  Clinical Data: follow up aortic valve replacement  CHEST - 2 VIEW  Comparison: 08/08/2011  Findings: Prior median sternotomy and aortic valve replacement. The heart size is mildly  enlarged.  There are small bilateral pleural effusions and bibasilar atelectasis.  No significant interstitial edema. This appears similar to previous exam.  IMPRESSION:  1.  Small pleural effusions and bibasilar atelectasis.  Original Report Authenticated By: Rosealee Albee, M.D.   Dg Chest Port 1 View  08/08/2011  *RADIOLOGY REPORT*  Clinical Data: Status post aortic valve replacement  PORTABLE CHEST - 1 VIEW  Comparison: Portable chest x-ray of 08/07/2011  Findings: The venous sheath has been removed from the SVC and no pneumothorax is seen.  Basilar linear atelectasis remains and cardiomegaly is stable.  IMPRESSION: No change in bibasilar atelectasis.  Venous sheath removed from SVC.  Original Report Authenticated By: Juline Patch, M.D.    INR: Will add last result for INR, ABG once components are confirmed Will add last 4 CBG results once components are  confirmed  Assessment/Plan: S/P Procedure(s) (LRB): AORTIC VALVE REPLACEMENT (AVR) (N/A)  1. Doing well overall 2 cont amio, in sinus, replace K+ 3 gentle diuresis 4 rehab 5 anemia improved 6 pulm toilet 7 cbg 78-140 range on metformin    LOS: 4 days    GOLD,WAYNE E 6/15/20139:22 AM    I have seen and examined the patient and agree with the assessment and plan as outlined.  Damesha Lawler H 08/09/2011 11:08 AM

## 2011-08-09 NOTE — Progress Notes (Signed)
CARDIAC REHAB PHASE I   PRE:  Rate/Rhythm: SR 76  BP:  Supine:   Sitting: 124/61  Standing:    SaO2: RA 94  MODE:  Ambulation: 300 ft   POST:  Rate/Rhythem: 85 BP:  Supine:   Sitting: 120/57  Standing:    SaO2: RA 94  Darrell [name]  Ambulated 300' x 1 assist with RW.  Pt tolerated well with no complaints.  Pt to bed with call bell in place.   Wife at bedside.  Pt advised to ambulate later today with nursing staff.  Pt and wife verbalized understanding.

## 2011-08-09 NOTE — Progress Notes (Signed)
Pt ambulated 40 ft independently and tolerated activity well. Will continue to monitor.

## 2011-08-10 LAB — GLUCOSE, CAPILLARY
Glucose-Capillary: 93 mg/dL (ref 70–99)
Glucose-Capillary: 86 mg/dL (ref 70–99)
Glucose-Capillary: 73 mg/dL (ref 70–99)
Glucose-Capillary: 61 mg/dL — ABNORMAL LOW (ref 70–99)
Glucose-Capillary: 126 mg/dL — ABNORMAL HIGH (ref 70–99)

## 2011-08-10 LAB — BASIC METABOLIC PANEL
BUN: 21 mg/dL (ref 6–23)
Creatinine, Ser: 0.86 mg/dL (ref 0.50–1.35)
GFR calc Af Amer: >90 mL/min (ref >90)
GFR calc non Af Amer: >90 mL/min (ref >90)
Potassium: 4.1 mEq/L (ref 3.5–5.1)

## 2011-08-10 MED ORDER — OXYCODONE HCL 5 MG PO TABS: 5.0000 mg | ORAL_TABLET | ORAL | Status: DC | PRN

## 2011-08-10 MED ORDER — AMIODARONE HCL 400 MG PO TABS: 400.0000 mg | ORAL_TABLET | Freq: Two times a day (BID) | ORAL | Status: DC

## 2011-08-10 MED ORDER — HYDROCORTISONE 1 % EX CREA
TOPICAL_CREAM | Freq: Two times a day (BID) | CUTANEOUS | Status: DC
  Administered 2011-08-10 (×2): via TOPICAL
  Filled 2011-08-10: qty 28

## 2011-08-10 MED ORDER — ASPIRIN 325 MG PO TBEC: 325.0000 mg | DELAYED_RELEASE_TABLET | Freq: Every day | ORAL | Status: DC

## 2011-08-10 MED ORDER — METOPROLOL TARTRATE 25 MG PO TABS: 25.0000 mg | ORAL_TABLET | Freq: Two times a day (BID) | ORAL | Status: DC

## 2011-08-10 MED ORDER — GUAIFENESIN ER 600 MG PO TB12: 600.0000 mg | ORAL_TABLET | Freq: Two times a day (BID) | ORAL | Status: DC | PRN

## 2011-08-10 MED ORDER — ONDANSETRON HCL 4 MG/2ML IJ SOLN
4.0000 mg | Freq: Four times a day (QID) | INTRAMUSCULAR | Status: DC | PRN
  Administered 2011-08-10: 4 mg via INTRAVENOUS
  Filled 2011-08-10: qty 2
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
## 2011-08-10 NOTE — Progress Notes (Addendum)
[name]            [place] [address]          [phone]     5 Days Post-Op  Procedure(s) (LRB): AORTIC VALVE REPLACEMENT (AVR) (N/A) Subjective: Feeling stronger today  Objective  Telemetry sinus rhythm  Temp:  [98.1 F (36.7 C)-98.6 F (37 C)] 98.6 F (37 C) (06/16 0511) Pulse Rate:  [72-85] 77  (06/16 0511) Resp:  [18-19] 19  (06/16 0511) BP: (116-134)/(57-77) 118/70 mmHg (06/16 0511) SpO2:  [96 %-98 %] 96 % (06/16 0511) Weight:  [206 lb 9.1 oz (93.7 kg)] 206 lb 9.1 oz (93.7 kg) (06/16 0511)   Intake/Output Summary (Last 24 hours) at 08/10/11 0740 Last data filed at 08/09/11 1400  Gross per 24 hour  Intake    480 ml  Output      0 ml  Net    480 ml       General appearance: alert, cooperative and no distress Heart: regular rate and rhythm, S1, S2 normal and soft systolic murmur Lungs: clear to auscultation bilaterally Abdomen: soft nontender Extremities: no edema Wound: incisions healing well  Lab Results:  Basename 08/09/11 0505 08/08/11 0341  NA 135 134*  K 3.6 3.7  CL 98 99  CO2 23 25  GLUCOSE 132* 125*  BUN 21 23  CREATININE 0.75 0.84  CALCIUM 7.9* 7.7*  MG 2.5 --  PHOS -- --   No results found for this basename: AST:2,ALT:2,ALKPHOS:2,BILITOT:2,PROT:2,ALBUMIN:2 in the last 72 hours No results found for this basename: LIPASE:2,AMYLASE:2 in the last 72 hours  Basename 08/09/11 0505 08/08/11 0341  WBC 10.2 10.3  NEUTROABS -- --  HGB 11.1* 10.5*  HCT 32.1* 30.3*  MCV 93.6 95.0  PLT 117* 72*   No results found for this basename: CKTOTAL:4,CKMB:4,TROPONINI:4 in the last 72 hours No components found with this basename: POCBNP:3 No results found for this basename: DDIMER in the last 72 hours No results found for this basename: HGBA1C in the last 72 hours No results found for this basename: CHOL,HDL,LDLCALC,TRIG,CHOLHDL in the last 72 hours  Basename 08/08/11 1031  TSH 2.525  T4TOTAL --  T3FREE --    THYROIDAB --   No results found for this basename: VITAMINB12,FOLATE,FERRITIN,TIBC,IRON,RETICCTPCT in the last 72 hours  Medications: Scheduled    . amiodarone  400 mg Oral Q12H   Followed by  . amiodarone  400 mg Oral Daily  . aspirin EC  325 mg Oral Daily  . atorvastatin  40 mg Oral q1800  . docusate sodium  200 mg Oral Daily  . furosemide  40 mg Oral Daily  . glipiZIDE  10 mg Oral Daily  . insulin aspart  0-24 Units Subcutaneous TID AC & HS  . lisinopril  2.5 mg Oral Daily  . metFORMIN  1,000 mg Oral BID WC  . metoprolol tartrate  25 mg Oral BID  . pantoprazole  40 mg Oral QAC breakfast  . potassium chloride  40 mEq Oral Daily  . sodium chloride  3 mL Intravenous Q12H  . DISCONTD: potassium chloride  20 mEq Oral Daily     Radiology/Studies:  Dg Chest 2 View  08/09/2011  *RADIOLOGY REPORT*  Clinical Data: follow up aortic valve replacement  CHEST - 2 VIEW  Comparison: 08/08/2011  Findings: Prior median sternotomy and aortic valve replacement. The heart size is mildly enlarged.  There  are small bilateral pleural effusions and bibasilar atelectasis.  No significant interstitial edema. This appears similar to previous exam.  IMPRESSION:  1.  Small pleural effusions and bibasilar atelectasis.  Original Report Authenticated By: Rosealee Albee, M.D.    INR: Will add last result for INR, ABG once components are confirmed Will add last 4 CBG results once components are confirmed  Assessment/Plan: S/P Procedure(s) (LRB): AORTIC VALVE REPLACEMENT (AVR) (N/A)  1 doing well 2. Remains in sinus on current rx 3. Sugars well controlled 91-120 4 pulm toilet/rehab 5 d/c epw's 6 poss home 1-2 days    LOS: 5 days    Darrell [name],Darrell [name] 6/16/20137:40 AM    I have seen and examined the patient and agree with the assessment and plan as outlined.  Jakwon Gayton H 08/10/2011 10:44 AM

## 2011-08-10 NOTE — Progress Notes (Signed)
Patient VSS and patient in NSR. DC'd EPW per MD order per hospital policy. Pacing wires intact upon removal. Patient tolerated well. Applied povidone-iodine to site and MSI. Patient reminded to remain in bed for 1 hour. Will continue to monitor closely. Lajuana Matte, RN

## 2011-08-10 NOTE — Discharge Instructions (Signed)
Aortic Valve Replacement Care After Read the instructions outlined below and refer to this sheet for the next few weeks. These discharge instructions provide you with general information on caring for yourself after you leave the hospital. Your surgeon may also give you specific instructions. While your treatment has been planned according to the most current medical practices available, unavoidable complications occasionally occur. If you have any problems or questions after discharge, please call your surgeon. AFTER THE PROCEDURE  Full recovery from heart valve surgery can take several months.   Blood thinning (anticoagulation) treatment with warfarin is often prescribed for 6 weeks to 3 months after surgery for those with biological valves. It is prescribed for life for those with mechanical valves.   Recovery includes healing of the surgical incision. There is a gradual building of stamina and exercise abilities. An exercise program under the direction of a physical therapist may be recommended.   Once you have an artificial valve, your heart function and your life will return to normal. You usually feel better after surgery. Shortness of breath and fatigue should lessen. If your heart was already severely damaged before your surgery, you may continue to have problems.   You can usually resume most of your normal activities. You will have to continue to monitor your condition. You need to watch out for blood clots and infections.   Artificial valves need to be replaced after a period of time. It is important that you see your caregiver regularly.   Some individuals with an aortic valve replacement need to take antibiotics before having dental work or other surgical procedures. This is called prophylactic antibiotic treatment. These drugs help to prevent infective endocarditis. Antibiotics are only recommended for individuals with the highest risk for developing infective endocarditis. Let your  dentist and your caregiver know if you have a history of any of the following so that the necessary precautions can be taken:   A VSD.   A repaired VSD.   Endocarditis in the past.   An artificial (prosthetic) heart valve.  HOME CARE INSTRUCTIONS   Use all medications as prescribed.   Take your temperature every morning for the first week after surgery. Record these.   Weigh yourself every morning for at least the first week after surgery and record.   Do not lift more than 10 pounds (4.5 kg) until your breastbone (sternum) has healed. Avoid all activities which would place strain on your incision.   You may shower as soon as directed by your caregiver after surgery. Pat incisions dry. Do not rub incisions with washcloth or towel.   Avoid driving for 4 to 6 weeks following surgery or as instructed.   Use your elastic stockings during the day. You should wear the stockings for at least 2 weeks after discharge or longer if your ankles are swollen. The stockings help blood flow and help reduce swelling in the legs. It is easiest to put the stockings on before you get out of bed in the morning. They should fit snugly.  Pain Control  If a prescription was given for a pain reliever, please follow your doctor's directions.   If the pain is not relieved by your medicine, becomes worse, or you have difficulty breathing, call your surgeon.  Activity  Take frequent rest periods throughout the day.   Wait one week before returning to strenuous activities such as heavy lifting (more than 10 pounds), pushing or pulling.   Talk with your doctor about when you may   return to work and your exercise routine.   Do not drive while taking prescription pain medication.  Nutrition  You may resume your normal diet.   Drink plenty of fluids (6-8 glasses a day).   Eat a well-balanced diet.   Call your caregiver for persistent nausea or vomiting.  Elimination Your normal bowel function should  return. If constipation should occur, you may:  Take a mild laxative.   Add fruit and bran to your diet.   Drink more fluids.   Call your doctor if constipation is not relieved.  SEEK IMMEDIATE MEDICAL CARE IF:   You develop chest pain which is not coming from your surgical cut (incision).   You develop shortness of breath or have difficulty breathing.   You develop a temperature over 101 F (38.3 C).   You have a sudden weight gain. Let your caregiver know what the weight gain is.   You develop a rash.   You develop any reaction or side effects to medications given.   You have increased bleeding from wounds.   You see redness, swelling, or have increasing pain in wounds.   You have pus coming from your wound.   You develop lightheadedness or feel faint.  Document Released: 08/29/2004 Document Revised: 01/30/2011 Document Reviewed: 11/20/2004 [employer] Patient Information 2012 [employer]. 

## 2011-08-11 LAB — BASIC METABOLIC PANEL
Calcium: 8.4 mg/dL (ref 8.4–10.5)
GFR calc non Af Amer: >90 mL/min (ref >90)
Sodium: 135 mEq/L (ref 135–145)

## 2011-08-11 LAB — GLUCOSE, CAPILLARY: Glucose-Capillary: 83 mg/dL (ref 70–99)

## 2011-08-11 MED ORDER — AMIODARONE HCL 200 MG PO TABS: 200.0000 mg | ORAL_TABLET | Freq: Two times a day (BID) | ORAL | Status: DC

## 2011-08-11 MED ORDER — METFORMIN HCL 500 MG PO TABS
1000.0000 mg | ORAL_TABLET | Freq: Two times a day (BID) | ORAL | Status: DC
  Filled 2011-08-11: qty 2

## 2011-08-11 MED ORDER — METOPROLOL TARTRATE 25 MG PO TABS: 25.0000 mg | ORAL_TABLET | Freq: Two times a day (BID) | ORAL | Status: DC

## 2011-08-11 MED ORDER — ASPIRIN 325 MG PO TBEC: 325.0000 mg | DELAYED_RELEASE_TABLET | Freq: Every day | ORAL | Status: DC

## 2011-08-11 MED ORDER — OXYCODONE HCL 5 MG PO TABS: 5.0000 mg | ORAL_TABLET | ORAL | Status: AC | PRN

## 2011-08-11 MED ORDER — GUAIFENESIN ER 600 MG PO TB12: 600.0000 mg | ORAL_TABLET | Freq: Two times a day (BID) | ORAL | Status: DC | PRN

## 2011-08-11 MED ORDER — AMIODARONE HCL 400 MG PO TABS: 200.0000 mg | ORAL_TABLET | Freq: Two times a day (BID) | ORAL | Status: DC

## 2011-08-11 MED ORDER — OXYCODONE HCL 5 MG PO TABS: 5.0000 mg | ORAL_TABLET | ORAL | Status: DC | PRN

## 2011-08-11 NOTE — Progress Notes (Signed)
[id number] Education completed with pt and wife. Permission given to refer to [place] Phase 2. Encouraged low sodium diet and to continue with good glucose control. Roxene Alviar DunlapRN

## 2011-08-11 NOTE — Progress Notes (Signed)
Pt's blood sugar tend to run low-60'2 -70's. Gave orange juice for asymptomatic blood sugar of 61. Blood sugar up to 126. Will not cover since its borderline but reassess in the am for need to cover as pt does not snack during nights and prevention of relapsed hypoglycemia.

## 2011-08-11 NOTE — Progress Notes (Signed)
6 Days Post-Op Procedure(s) (LRB): AORTIC VALVE REPLACEMENT (AVR) (N/A)  Subjective: Patient feels fairly well and wants to go home.  Objective: Vital signs in last 24 hours: Patient Vitals for the past 24 hrs:  BP Temp Temp src Pulse Resp SpO2 Weight  08/11/11 0516 122/71 mmHg 98.4 F (36.9 C) Oral 76  19  95 % 200 lb 9.9 oz (91 kg)  08/10/11 2242 138/70 mmHg - - 74  - - -  08/10/11 2048 144/74 mmHg 98.2 F (36.8 C) Oral 79  19  93 % -  08/10/11 1345 136/74 mmHg 99 F (37.2 C) Oral 74  18  93 % -  08/10/11 1039 138/64 mmHg - - 80  - - -   Pre op weight  92 kg Current Weight  08/11/11 200 lb 9.9 oz (91 kg)      Intake/Output from previous day:     Physical Exam:  Cardiovascular: RRR, no murmurs, gallops, or rubs. Pulmonary: Clear to auscultation bilaterally; no rales, wheezes, or rhonchi. Abdomen: Soft, non tender, bowel sounds present. Extremities: Trace bilateral lower extremity edema. Wound: Clean and dry.  No erythema or signs of infection.  Lab Results: CBC: Basename 08/09/11 0505  WBC 10.2  HGB 11.1*  HCT 32.1*  PLT 117*   BMET:  Basename 08/11/11 0530 08/10/11 0630  NA 135 137  K 4.0 4.1  CL 98 100  CO2 27 27  GLUCOSE 83 96  BUN 20 21  CREATININE 0.87 0.86  CALCIUM 8.4 8.1*    PT/INR: No results found for this basename: LABPROT,INR in the last 72 hours ABG:  INR: Will add last result for INR, ABG once components are confirmed Will add last 4 CBG results once components are confirmed  Assessment/Plan:  1. CV - Previous afib.Maintaining SR.Continue Amiodarone, Lisinopril, and Lopressor. 2.  Pulmonary - Encourage incentive spirometer. 3. Volume Overload - Continue with diuresis. 4.  Acute blood loss anemia - Last H and H stable at 11.1 and 32.1. 5.DM-CBGs 61/126/83.Continue Metformin and glipizide.Pre op HGA1C 6. 6.Remove CT sutures. 7.Discharge home.  Shemika Robbs MPA-C 08/11/2011

## 2011-08-11 NOTE — Progress Notes (Signed)
DC'd CT sutures per MD order per hospital policy. Applied benzoin and steri strips to site. Patient tolerated well. Will continue to monitor. Lajuana Matte, RN

## 2011-08-11 NOTE — Plan of Care (Signed)
Problem: Phase III Progression Outcomes Goal: Other Phase III Outcomes/Goals Outcome: Completed/Met Date Met:  08/11/11 Watched video #113 - going home after heart surgery

## 2011-08-22 ENCOUNTER — Other Ambulatory Visit: Payer: Self-pay | Admitting: Cardiothoracic Surgery

## 2011-08-22 DIAGNOSIS — I359 Nonrheumatic aortic valve disorder, unspecified: Secondary | ICD-10-CM

## 2011-08-25 DIAGNOSIS — Z0279 Encounter for issue of other medical certificate: Secondary | ICD-10-CM

## 2011-08-27 ENCOUNTER — Other Ambulatory Visit: Payer: Self-pay

## 2011-08-27 DIAGNOSIS — G8918 Other acute postprocedural pain: Secondary | ICD-10-CM

## 2011-08-27 MED ORDER — HYDROCODONE-ACETAMINOPHEN 7.5-325 MG PO TABS: 1.0000 | ORAL_TABLET | ORAL | Status: AC | PRN

## 2011-08-27 NOTE — Telephone Encounter (Signed)
Called in RX for Norco 7.5/325 mg 1 po every 4 hrs prn pain #40/0 refill to [facility]. Pt is aware.

## 2011-09-04 ENCOUNTER — Encounter: Payer: Self-pay | Admitting: Cardiothoracic Surgery

## 2011-09-04 ENCOUNTER — Ambulatory Visit (INDEPENDENT_AMBULATORY_CARE_PROVIDER_SITE_OTHER): Payer: Self-pay | Admitting: Cardiothoracic Surgery

## 2011-09-04 ENCOUNTER — Ambulatory Visit
Admission: RE | Admit: 2011-09-04 | Discharge: 2011-09-04 | Disposition: A | Discharge status: Home / Self Care | Payer: BC Managed Care – PPO | Source: Ambulatory Visit | Attending: Cardiothoracic Surgery | Admitting: Cardiothoracic Surgery

## 2011-09-04 VITALS — BP 149/78 | HR 67 | Resp 16 | Ht 69.0 in | Wt 183.5 lb

## 2011-09-04 DIAGNOSIS — I350 Nonrheumatic aortic (valve) stenosis: Secondary | ICD-10-CM

## 2011-09-04 DIAGNOSIS — I359 Nonrheumatic aortic valve disorder, unspecified: Secondary | ICD-10-CM

## 2011-09-04 DIAGNOSIS — Z09 Encounter for follow-up examination after completed treatment for conditions other than malignant neoplasm: Secondary | ICD-10-CM

## 2011-09-04 IMAGING — CR DG CHEST 2V
2 series · 2 of 2 positions shown · non-contrast
Comparison: Chest x-ray of [DATE]

CLINICAL DATA: History of aortic valve replacement, follow-up

CHEST - 2 VIEW

[w chest pa]
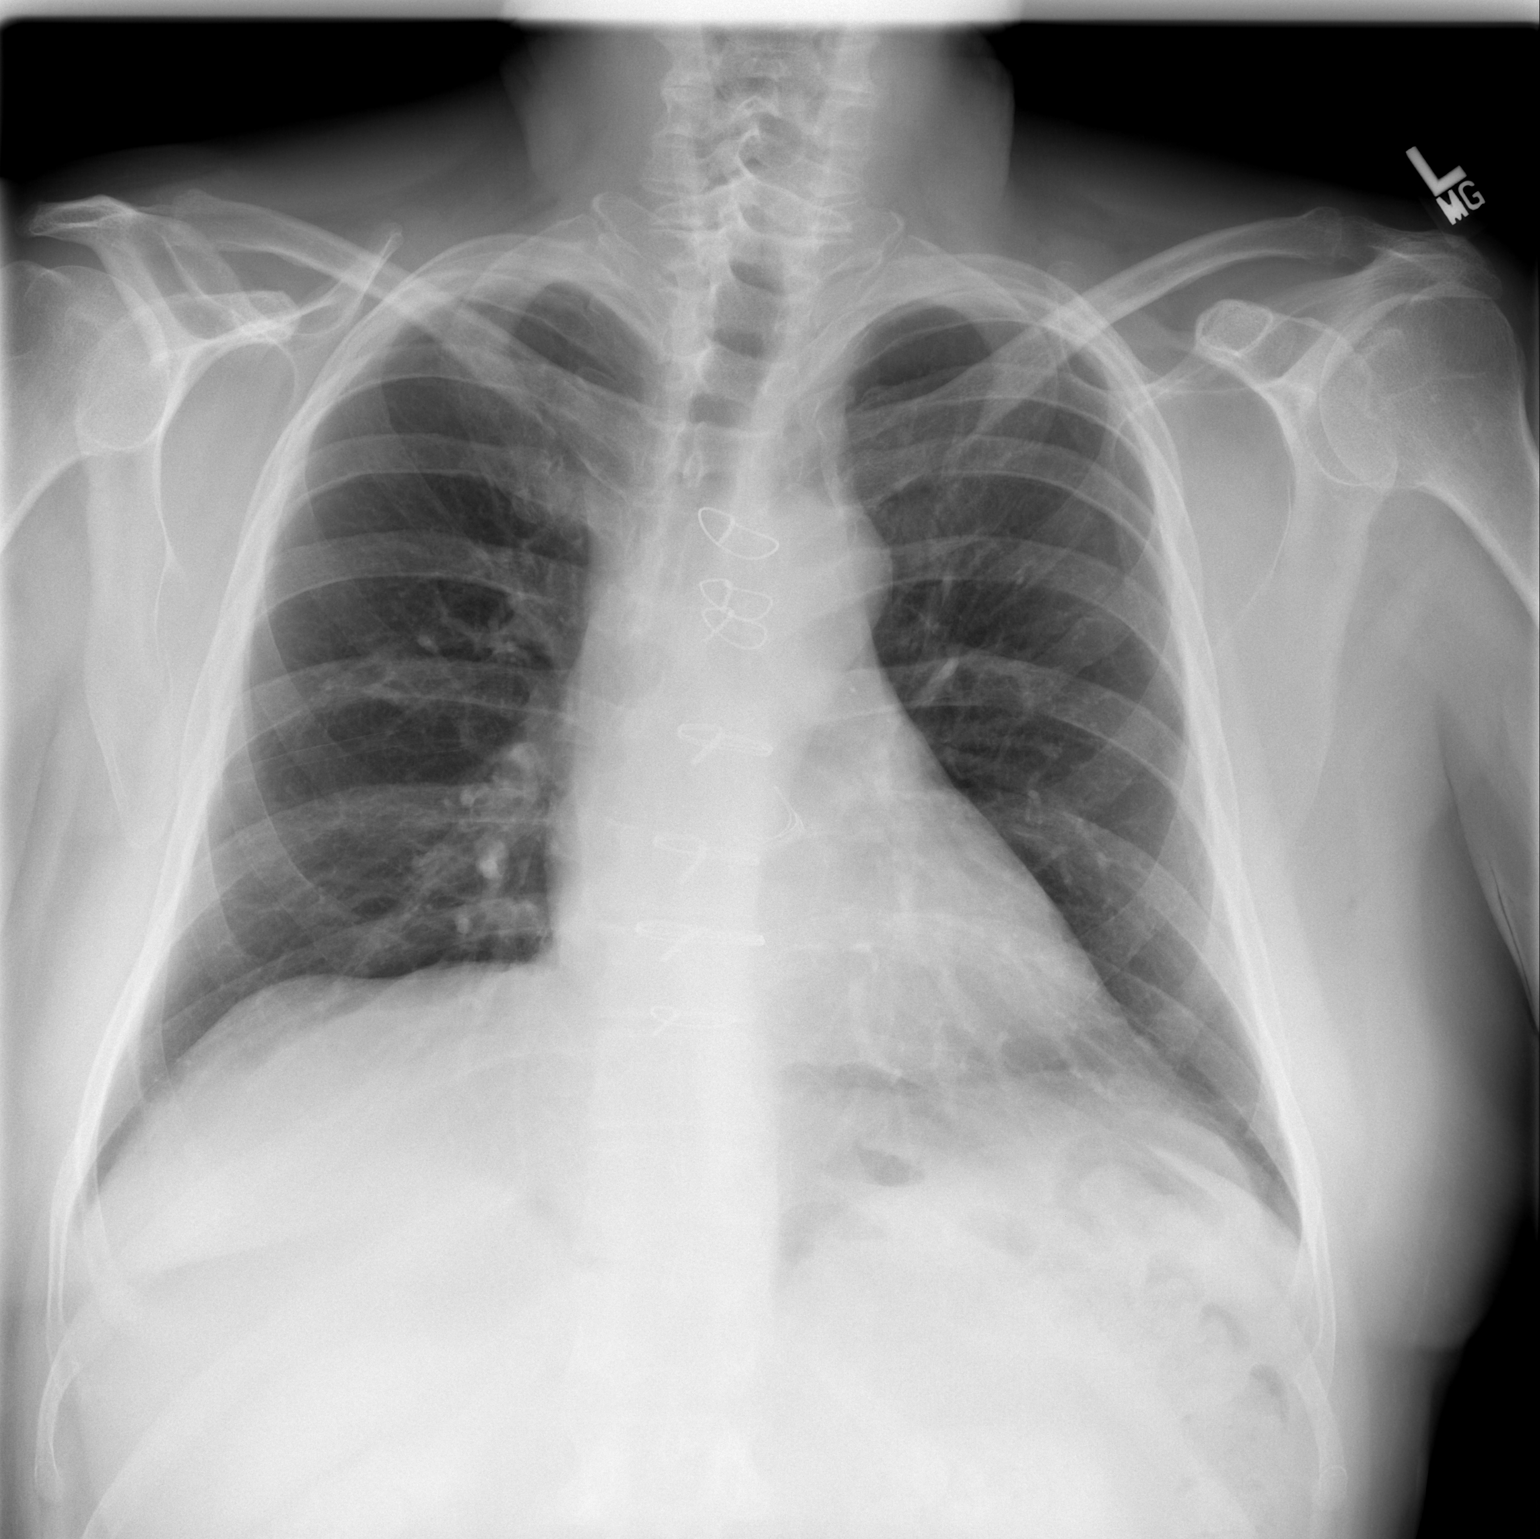

[w chest lat]
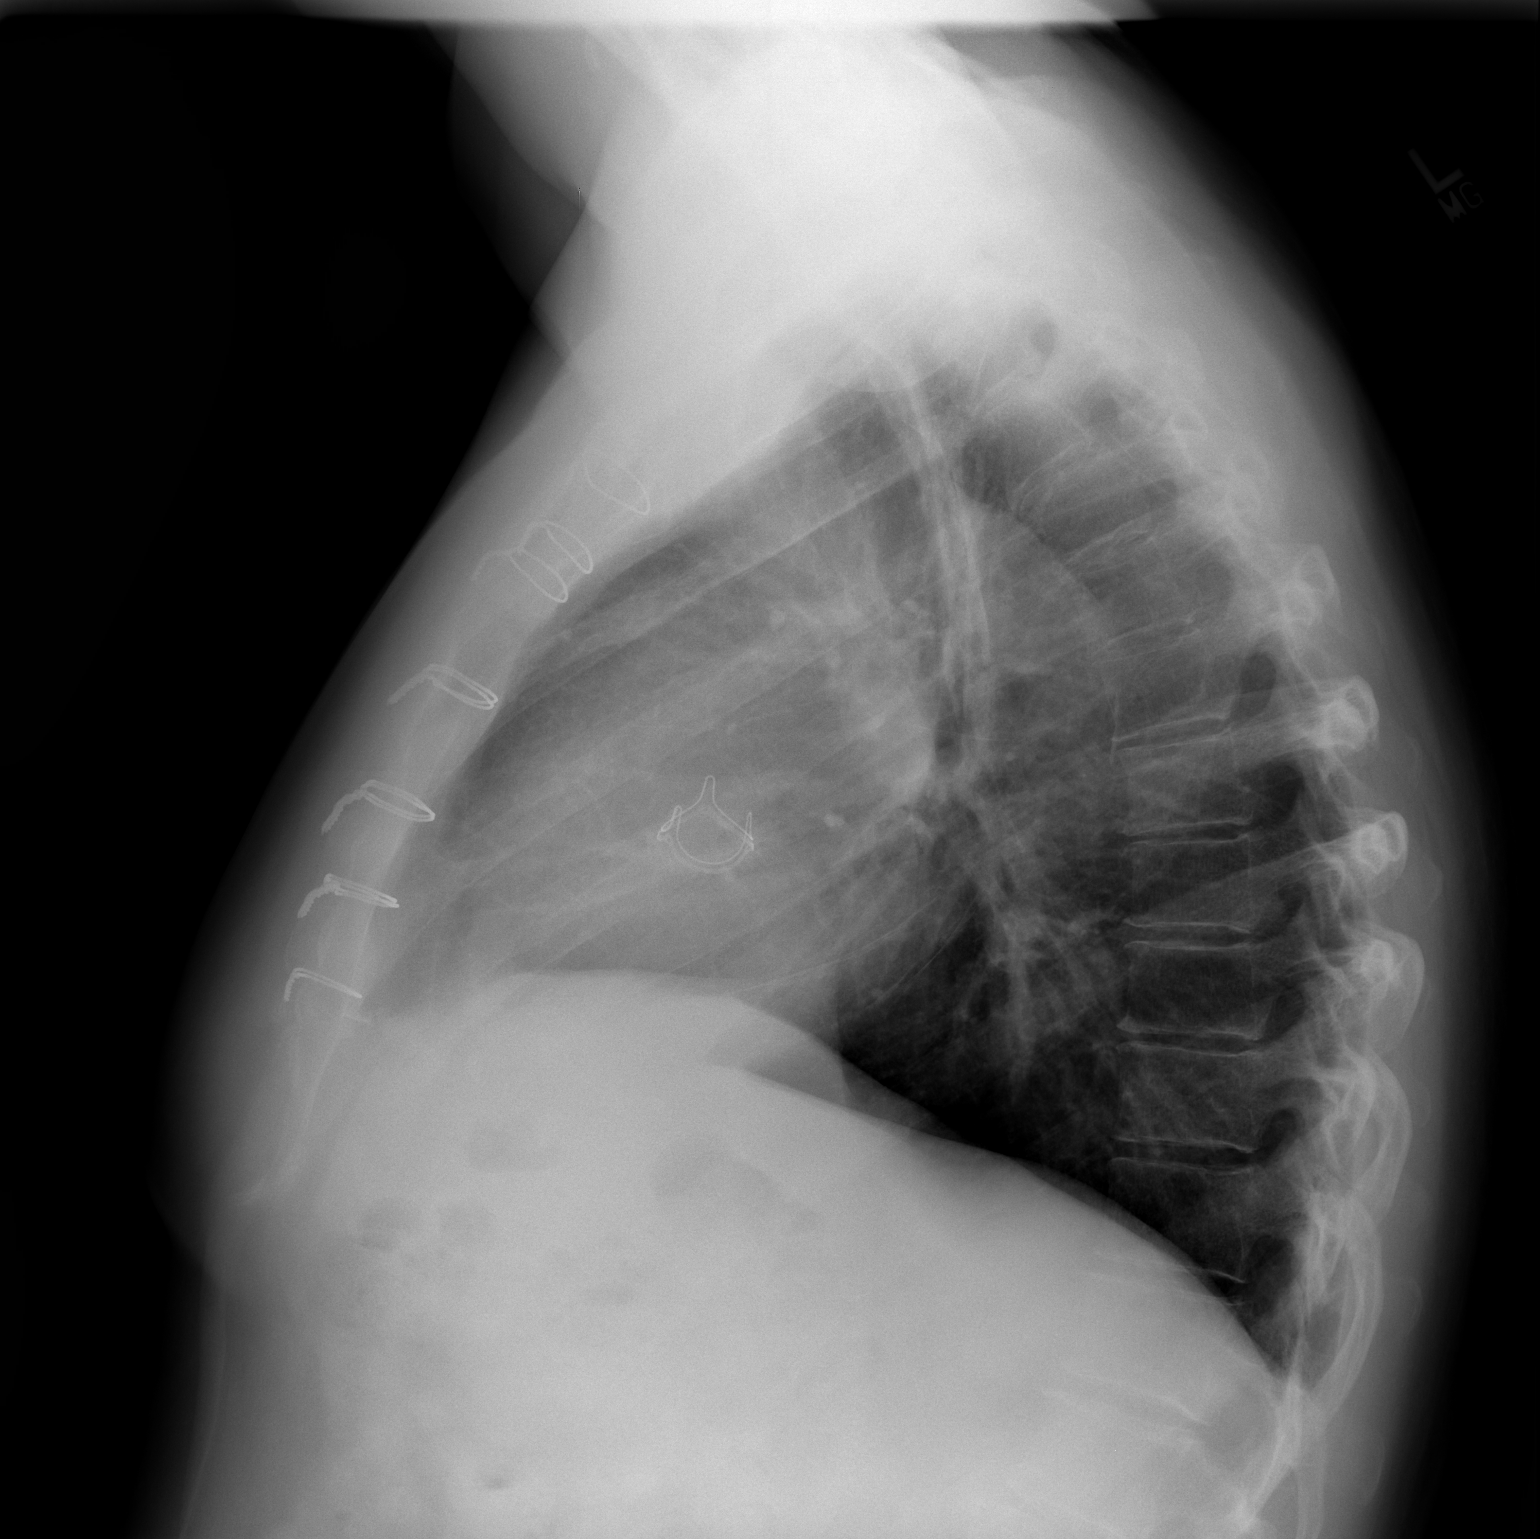

[2 of 2 positions shown; findings below may reference images not displayed]

FINDINGS: The pleural effusions have resolved and aeration has
improved.  Cardiomegaly is stable with aortic valve replacement
noted.  Median sternotomy sutures are present.
IMPRESSION: Resolution of effusions.  No active lung disease.

## 2011-09-04 MED ORDER — AMIODARONE HCL 200 MG PO TABS: 200.0000 mg | ORAL_TABLET | Freq: Every day | ORAL | Status: DC

## 2011-09-04 NOTE — Patient Instructions (Addendum)
No lifting over 20-25 lbs for 3 months May drive car     Endocarditis Information  You may be at risk for developing endocarditis since you have  an artificial heart valve  or a repaired heart valve. Endocarditis is an infection of the lining of the heart or heart valves.   Certain surgical and dental procedures may put you at risk,  such as teeth cleaning or other dental procedures or any surgery involving the respiratory, urinary, gastrointestinal tract, gallbladder or prostate.   Notify your doctor or dentist before having any invasive procedures. You will need to take antibiotics before certain procedures.   To prevent endocarditis, maintain good oral health. Seek prompt medical attention for any mouth/gum, skin or urinary tract infections.        Aortic Valve Replacement Care After Read the instructions outlined below and refer to this sheet for the next few weeks. These discharge instructions provide you with general information on caring for yourself after you leave the hospital. Your surgeon may also give you specific instructions. While your treatment has been planned according to the most current medical practices available, unavoidable complications occasionally occur. If you have any problems or questions after discharge, please call your surgeon. AFTER THE PROCEDURE  Full recovery from heart valve surgery can take several months.   Blood thinning (anticoagulation) treatment with warfarin is often prescribed for 6 weeks to 3 months after surgery for those with biological valves. It is prescribed for life for those with mechanical valves.   Recovery includes healing of the surgical incision. There is a gradual building of stamina and exercise abilities. An exercise program under the direction of a physical therapist may be recommended.   Once you have an artificial valve, your heart function and your life will return to normal. You usually feel better after surgery.  Shortness of breath and fatigue should lessen. If your heart was already severely damaged before your surgery, you may continue to have problems.   You can usually resume most of your normal activities. You will have to continue to monitor your condition. You need to watch out for blood clots and infections.   Artificial valves need to be replaced after a period of time. It is important that you see your caregiver regularly.   Some individuals with an aortic valve replacement need to take antibiotics before having dental work or other surgical procedures. This is called prophylactic antibiotic treatment. These drugs help to prevent infective endocarditis. Antibiotics are only recommended for individuals with the highest risk for developing infective endocarditis. Let your dentist and your caregiver know if you have a history of any of the following so that the necessary precautions can be taken:   A VSD.   A repaired VSD.   Endocarditis in the past.   An artificial (prosthetic) heart valve.  HOME CARE INSTRUCTIONS   Use all medications as prescribed.   Take your temperature every morning for the first week after surgery. Record these.   Weigh yourself every morning for at least the first week after surgery and record.   Do not lift more than 10 pounds (4.5 kg) until your breastbone (sternum) has healed. Avoid all activities which would place strain on your incision.   You may shower as soon as directed by your caregiver after surgery. Pat incisions dry. Do not rub incisions with washcloth or towel.   Avoid driving for 4 to 6 weeks following surgery or as instructed.   Use your elastic stockings  during the day. You should wear the stockings for at least 2 weeks after discharge or longer if your ankles are swollen. The stockings help blood flow and help reduce swelling in the legs. It is easiest to put the stockings on before you get out of bed in the morning. They should fit snugly.    Pain Control  If a prescription was given for a pain reliever, please follow your doctor's directions.   If the pain is not relieved by your medicine, becomes worse, or you have difficulty breathing, call your surgeon.  Activity  Take frequent rest periods throughout the day.   Wait one week before returning to strenuous activities such as heavy lifting (more than 10 pounds), pushing or pulling.   Talk with your doctor about when you may return to work and your exercise routine.   Do not drive while taking prescription pain medication.  Nutrition  You may resume your normal diet.   Drink plenty of fluids (6-8 glasses a day).   Eat a well-balanced diet.   Call your caregiver for persistent nausea or vomiting.  Elimination Your normal bowel function should return. If constipation should occur, you may:  Take a mild laxative.   Add fruit and bran to your diet.   Drink more fluids.   Call your doctor if constipation is not relieved.  SEEK IMMEDIATE MEDICAL CARE IF:   You develop chest pain which is not coming from your surgical cut (incision).   You develop shortness of breath or have difficulty breathing.   You develop a temperature over 101 F (38.3 C).   You have a sudden weight gain. Let your caregiver know what the weight gain is.   You develop a rash.   You develop any reaction or side effects to medications given.   You have increased bleeding from wounds.   You see redness, swelling, or have increasing pain in wounds.   You have pus coming from your wound.   You develop lightheadedness or feel faint.  Document Released: 08/29/2004 Document Revised: 01/30/2011 Document Reviewed: 11/20/2004 [facility] Patient Information 2012 [place], [place].

## 2011-09-04 NOTE — Progress Notes (Signed)
[address]            [place] [address]          [phone]       Darrell [name] Medical Record #[id number] Date of Birth: 1950-03-22  Darrell [name], Darrell [name] Darrell [name], Darrell [name]  Chief Complaint:   PostOp Follow Up Visit 08/05/2011  DATE OF DISCHARGE:  OPERATIVE REPORT  PREOPERATIVE DIAGNOSIS: Critical aortic stenosis.  POSTOPERATIVE DIAGNOSIS: Critical Aortic Stenosis  SURGICAL PROCEDURE: Aortic valve replacement with pericardial tissue  valve, [employer], 23 mm, model 3300TFX, serial [id number].      History of Present Illness:     Patient is now one month postop following aortic valve replacement and doing well. His symptoms of shortness of breath with exertion and congestive heart flair have dramatically improved. He's increasing his physical activity appropriately.      History  Smoking status  . Never Smoker   Smokeless tobacco  . Not on file       No Known Allergies  Current Outpatient Prescriptions  Medication Sig Dispense Refill  . amiodarone (PACERONE) 200 MG tablet Take 1 tablet (200 mg total) by mouth every 12 (twelve) hours. For 3 days;then take 200 mg by mouth two times daily.  60 tablet  1  . aspirin 325 MG EC tablet Take 1 tablet (325 mg total) by mouth daily.  30 tablet    . CINNAMON PO Take 2 capsules by mouth daily.      Marland Kitchen GARLIC PO Take 2-3 tablets by mouth 2 (two) times daily.       Marland Kitchen glipiZIDE (GLUCOTROL XL) 10 MG 24 hr tablet Take 10 mg by mouth daily.      Marland Kitchen HYDROcodone-acetaminophen (NORCO) 7.5-325 MG per tablet Take 1 tablet by mouth every 4 (four) hours as needed for pain.  40 tablet  0  . metFORMIN (GLUCOPHAGE) 1000 MG tablet Take 1,000 mg by mouth 2 (two) times daily with a meal.      . metoprolol tartrate (LOPRESSOR) 25 MG tablet Take 1 tablet (25 mg total) by mouth 2 (two) times daily.  60 tablet  1  . Multiple Vitamin (MULITIVITAMIN WITH MINERALS) TABS Take 1 tablet by  mouth daily.      Marland Kitchen olmesartan-hydrochlorothiazide (BENICAR HCT) 20-12.5 MG per tablet Take 0.5 tablets by mouth daily with breakfast.       . rosuvastatin (CRESTOR) 20 MG tablet Take 20 mg by mouth at bedtime.       . vitamin C (ASCORBIC ACID) 500 MG tablet Take 500 mg by mouth 2 (two) times daily.            Physical Exam: BP 149/78  Pulse 67  Resp 16  Ht 5\' 9"  (1.753 m)  Wt 183 lb 8 oz (83.235 kg)  BMI 27.10 kg/m2  SpO2 98%  General appearance: alert, cooperative and no distress Neurologic: intact Heart: regular rate and rhythm, S1, S2 normal, no murmur, click, rub or gallop and normal apical impulse, I do not appreciate any murmur of aortic insufficiency  Lungs: clear to auscultation bilaterally and normal percussion bilaterally Abdomen: soft, non-tender; bowel sounds normal; no masses,  no organomegaly Extremities: extremities normal, atraumatic, no cyanosis or edema and Homans sign is negative, no sign of DVT Wound: Sternum is stable and well healed   Diagnostic Studies & Laboratory data:  Recent Radiology Findings: Dg Chest 2 View  09/04/2011  *RADIOLOGY REPORT*  Clinical Data: History of aortic valve replacement, follow-up  CHEST - 2 VIEW  Comparison: Chest x-ray of 08/09/2011  Findings: The pleural effusions have resolved and aeration has improved.  Cardiomegaly is stable with aortic valve replacement noted.  Median sternotomy sutures are present.  IMPRESSION: Resolution of effusions.  No active lung disease.  Original Report Authenticated By: Juline Patch, M.D.      Recent Labs: Lab Results  Component Value Date   WBC 10.2 08/09/2011   HGB 11.1* 08/09/2011   HCT 32.1* 08/09/2011   PLT 117* 08/09/2011   GLUCOSE 83 08/11/2011   ALT 20 08/01/2011   AST 16 08/01/2011   NA 135 08/11/2011   K 4.0 08/11/2011   CL 98 08/11/2011   CREATININE 0.87 08/11/2011   BUN 20 08/11/2011   CO2 27 08/11/2011   TSH 2.525 08/08/2011   INR 1.61* 08/05/2011   HGBA1C 6.0* 08/01/2011       Assessment / Plan:     Overall the patient has been steadily improving since discharge from aortic valve replacement. I reviewed with him I plan to return to work, and reviewed postop instructions including risks of endocarditis and antibacterial prophylaxis with a prosthetic valve. He will make a return appointment to see Dr. Herbie Baltimore in the next several weeks. I have decreased his dose of amiodarone to 200 mg once a day, this could probably be discontinued when he sees Dr. Herbie Baltimore. I plan to see him back when necessary and his or Dr. Elissa Hefty request       [name] Darrell [name] 09/04/2011 2:53 PM

## 2011-09-18 ENCOUNTER — Ambulatory Visit (INDEPENDENT_AMBULATORY_CARE_PROVIDER_SITE_OTHER): Payer: Self-pay | Admitting: Surgical

## 2011-09-18 VITALS — BP 143/74 | HR 69 | Resp 18 | Ht 69.0 in | Wt 183.0 lb

## 2011-09-18 DIAGNOSIS — Z952 Presence of prosthetic heart valve: Secondary | ICD-10-CM

## 2011-09-18 DIAGNOSIS — I350 Nonrheumatic aortic (valve) stenosis: Secondary | ICD-10-CM

## 2011-09-18 DIAGNOSIS — I359 Nonrheumatic aortic valve disorder, unspecified: Secondary | ICD-10-CM

## 2011-09-18 DIAGNOSIS — IMO0002 Reserved for concepts with insufficient information to code with codable children: Secondary | ICD-10-CM

## 2011-09-18 DIAGNOSIS — Z954 Presence of other heart-valve replacement: Secondary | ICD-10-CM

## 2011-09-18 DIAGNOSIS — T07XXXA Unspecified multiple injuries, initial encounter: Secondary | ICD-10-CM

## 2011-09-18 MED ORDER — CEPHALEXIN 500 MG PO CAPS: 500.0000 mg | ORAL_CAPSULE | Freq: Three times a day (TID) | ORAL | Status: AC

## 2011-09-18 NOTE — Progress Notes (Signed)
PCP is [name], MD Referring Provider is Croitoru, Rachelle Hora, MD  Chief Complaint  Patient presents with  . Wound Check    c/o redness,drainage from sternal incision with knot, S/P AVR on 08/05/11    HPI: Patient presented to the office on today's date after calling. He noted a small area in the midportion of the sternal incision which was slightly flocculent with minimal associated erythema. There is also some slight purulence associated with it. He denies fevers, chills or other constitutional symptoms. He feels quite well. He is working daily as a Merchandiser, retail with minimal exertion related to his work activities.   Past Medical History  Diagnosis Date  . Hyperlipidemia   . Diverticulosis     slight -per colonoscopy, pt. asymptomatic- thus far  . GERD (gastroesophageal reflux disease)   . Insomnia   . NIDDM (non-insulin dependent diabetes mellitus)   . Aortic stenosis 05/2011    Severe by recent echo; AoV area ~0.77-79 cm2, peak /mean gradient 86 mmHg/ 55 mmHg.  Marland Kitchen History of BPH   . Hypertension     [facility]- Dr. Herbie Baltimore   . Myocardial infarction   . Heart murmur   . Shortness of breath     /w exertion   . Recurrent upper respiratory infection (URI)     cold- curently- "per pt., on the mend"  . Arthritis     back- low- [facility]- workmen's comp. situation   . Depression     quick temper- per pt.     Past Surgical History  Procedure Date  . Cardiac catheterization     [facility]- 06/2011  . Nasal septum surgery     [facility] 10 yrs. ago  . Aortic valve replacement 08/05/2011    Procedure: AORTIC VALVE REPLACEMENT (AVR);  Surgeon: [name], MD;  Location: [facility] OR;  Service: Open Heart Surgery;  Laterality: N/A;    Family History  Problem Relation Age of Onset  . Heart disease Mother   . Heart attack Brother   . Anesthesia problems Neg Hx     Social History History  Substance Use Topics  . Smoking status: Never Smoker   . Smokeless tobacco: Not  on file  . Alcohol Use: No    Current Outpatient Prescriptions  Medication Sig Dispense Refill  . amiodarone (PACERONE) 200 MG tablet Take 200 mg by mouth daily.      Marland Kitchen aspirin 325 MG EC tablet Take 1 tablet (325 mg total) by mouth daily.  30 tablet    . CINNAMON PO Take 2 capsules by mouth daily.      Marland Kitchen GARLIC PO Take 2-3 tablets by mouth 2 (two) times daily.       Marland Kitchen glipiZIDE (GLUCOTROL XL) 10 MG 24 hr tablet Take 10 mg by mouth daily.      . metFORMIN (GLUCOPHAGE) 1000 MG tablet Take 1,000 mg by mouth 2 (two) times daily with a meal.      . metoprolol tartrate (LOPRESSOR) 25 MG tablet Take 1 tablet (25 mg total) by mouth 2 (two) times daily.  60 tablet  1  . Multiple Vitamin (MULITIVITAMIN WITH MINERALS) TABS Take 1 tablet by mouth daily.      Marland Kitchen olmesartan-hydrochlorothiazide (BENICAR HCT) 20-12.5 MG per tablet Take 0.5 tablets by mouth daily with breakfast.       . rosuvastatin (CRESTOR) 20 MG tablet Take 20 mg by mouth at bedtime.       . vitamin C (ASCORBIC ACID) 500  MG tablet Take 500 mg by mouth 2 (two) times daily.       Marland Kitchen DISCONTD: amiodarone (PACERONE) 200 MG tablet Take 1 tablet (200 mg total) by mouth daily. For 3 days;then take 200 mg by mouth two times daily.  60 tablet  1  . cephALEXin (KEFLEX) 500 MG capsule Take 1 capsule (500 mg total) by mouth 3 (three) times daily.  21 capsule  0    No Known Allergies  Review of Systems otherwise noncontributory BP 143/74  Pulse 69  Resp 18  Ht 5\' 9"  (1.753 m)  Wt 183 lb (83.008 kg)  BMI 27.02 kg/m2  SpO2 98% temperature 98.0 degrees Physical Exam sternal incision-no sternal click or bone movement. Approximately 0.5 cm superficial skin opening with yellow purulent drainage. Minimal surrounding erythema. Minimal tenderness to palpation.   Diagnostic Tests: None Brief procedure: After Betadine prep by enlarged the incision approximately 1/2 cm. There is no tracking noted after probing. Scant amount of Prelone drainage expressed.  Packed with iodoform and covered with sterile 2 x 2  Impression: Superficial skin infection status post aortic valve replacement in June of this year. The patient appears quite.   Plan: Will arrange for nurse to pack wound once daily with iodoform and cover with sterile 2 x 2. We will see in the office next Monday to recheck and sooner when necessary if worsens. Will start on Keflex 500 mg by mouth 3 times a day for 7 days.

## 2011-09-19 ENCOUNTER — Ambulatory Visit: Payer: BC Managed Care – PPO

## 2011-09-19 DIAGNOSIS — T07XXXA Unspecified multiple injuries, initial encounter: Secondary | ICD-10-CM

## 2011-09-19 DIAGNOSIS — IMO0001 Reserved for inherently not codable concepts without codable children: Secondary | ICD-10-CM

## 2011-09-19 NOTE — Progress Notes (Unsigned)
Changed sternal wound dressing (packed wound with iodoform and cover with sterile 2 x 2) Instructed wife on how to do dressing change. She states that she understood and will change BID. He is scheduled to see PA on 09/22/11 for follow up.

## 2011-09-22 ENCOUNTER — Ambulatory Visit (INDEPENDENT_AMBULATORY_CARE_PROVIDER_SITE_OTHER): Payer: Self-pay | Admitting: Physician Assistant

## 2011-09-22 VITALS — BP 166/77 | HR 72 | Resp 18 | Ht 69.0 in | Wt 183.0 lb

## 2011-09-22 DIAGNOSIS — Z954 Presence of other heart-valve replacement: Secondary | ICD-10-CM

## 2011-09-22 DIAGNOSIS — I359 Nonrheumatic aortic valve disorder, unspecified: Secondary | ICD-10-CM

## 2011-09-22 DIAGNOSIS — T8132XA Disruption of internal operation (surgical) wound, not elsewhere classified, initial encounter: Secondary | ICD-10-CM

## 2011-09-22 DIAGNOSIS — Z952 Presence of prosthetic heart valve: Secondary | ICD-10-CM

## 2011-09-22 NOTE — Progress Notes (Signed)
  HPI: Patient returns for wound check.  The patient contacted the office last week with a complaint of an opening along the mid portion of his sternotomy with purulent drainage present.  He was last evaluated on 09/18/2011 at which time his wound was debrided.  He was instructed to have the wound packed daily and to take Keflex 500mg  TID for 1 week. The patient states his wound is doing much better.  There is no more purulent drainage present.  His wound is being packed daily and he questions how long will he need to do this.  He denies fevers, chills, sweats.  Current Outpatient Prescriptions  Medication Sig Dispense Refill  . amiodarone (PACERONE) 200 MG tablet Take 200 mg by mouth daily.      Marland Kitchen aspirin 325 MG EC tablet Take 1 tablet (325 mg total) by mouth daily.  30 tablet    . cephALEXin (KEFLEX) 500 MG capsule Take 1 capsule (500 mg total) by mouth 3 (three) times daily.  21 capsule  0  . CINNAMON PO Take 2 capsules by mouth daily.      Marland Kitchen GARLIC PO Take 2-3 tablets by mouth 2 (two) times daily.       Marland Kitchen glipiZIDE (GLUCOTROL XL) 10 MG 24 hr tablet Take 10 mg by mouth daily.      . metFORMIN (GLUCOPHAGE) 1000 MG tablet Take 1,000 mg by mouth 2 (two) times daily with a meal.      . metoprolol tartrate (LOPRESSOR) 25 MG tablet Take 1 tablet (25 mg total) by mouth 2 (two) times daily.  60 tablet  1  . Multiple Vitamin (MULITIVITAMIN WITH MINERALS) TABS Take 1 tablet by mouth daily.      Marland Kitchen olmesartan-hydrochlorothiazide (BENICAR HCT) 20-12.5 MG per tablet Take 0.5 tablets by mouth daily with breakfast.       . rosuvastatin (CRESTOR) 20 MG tablet Take 20 mg by mouth at bedtime.       . vitamin C (ASCORBIC ACID) 500 MG tablet Take 500 mg by mouth 2 (two) times daily.         Physical Exam:  BP 166/77  Pulse 72  Resp 18  Ht 5\' 9"  (1.753 m)  Wt 183 lb (83.008 kg)  BMI 27.02 kg/m2  SpO2 97%  Gen: no apparent distress Lungs: CTA bilaterally Heart: RRR Skin: approximately 1cm opening along  mid portion of sternotomy.  Wound is clean with pink base and no purulent drainage present.  Remains about 1 cm deep with no evidence of tracking.  Impression:  Mr. Hodsdon is S/P AVR performed in June, with resolving area of cellulitis in the mid portion of his sternotomy.    Plan:  Will continue Keflex to complete 7 day course.  Patient instructed to continue current wound care regimen.  RTC in 1 week time for further wound check at which time we will likely be able to discontinue wound packing.  Patient will call us if wound worsens prior to appointment

## 2011-09-29 ENCOUNTER — Ambulatory Visit (INDEPENDENT_AMBULATORY_CARE_PROVIDER_SITE_OTHER): Payer: Self-pay | Admitting: Physician Assistant

## 2011-09-29 VITALS — BP 127/84 | HR 76 | Resp 18 | Ht 69.0 in | Wt 183.0 lb

## 2011-09-29 DIAGNOSIS — Z954 Presence of other heart-valve replacement: Secondary | ICD-10-CM

## 2011-09-29 DIAGNOSIS — T8132XA Disruption of internal operation (surgical) wound, not elsewhere classified, initial encounter: Secondary | ICD-10-CM

## 2011-09-29 DIAGNOSIS — I359 Nonrheumatic aortic valve disorder, unspecified: Secondary | ICD-10-CM

## 2011-09-29 DIAGNOSIS — Z952 Presence of prosthetic heart valve: Secondary | ICD-10-CM

## 2011-09-29 NOTE — Progress Notes (Signed)
  HPI:  Patient returns for wound check.  He was seen last week at which time he was instructed to continue wound packing daily with wet to dry dressing.  The patient states the wound continues to improve.  There is no purulent drainage present and the wound appears to be smaller.  He is hoping to be able to discontinue wound packing.   Current Outpatient Prescriptions  Medication Sig Dispense Refill  . amiodarone (PACERONE) 200 MG tablet Take 200 mg by mouth daily.      Marland Kitchen aspirin 325 MG EC tablet Take 1 tablet (325 mg total) by mouth daily.  30 tablet    . CINNAMON PO Take 2 capsules by mouth daily.      Marland Kitchen GARLIC PO Take 2-3 tablets by mouth 2 (two) times daily.       Marland Kitchen glipiZIDE (GLUCOTROL XL) 10 MG 24 hr tablet Take 10 mg by mouth daily.      Marland Kitchen losartan-hydrochlorothiazide (HYZAAR) 50-12.5 MG per tablet Take 1 tablet by mouth daily.       . metFORMIN (GLUCOPHAGE) 1000 MG tablet Take 1,000 mg by mouth 2 (two) times daily with a meal.      . metoprolol tartrate (LOPRESSOR) 25 MG tablet Take 1 tablet (25 mg total) by mouth 2 (two) times daily.  60 tablet  1  . Multiple Vitamin (MULITIVITAMIN WITH MINERALS) TABS Take 1 tablet by mouth daily.      . rosuvastatin (CRESTOR) 20 MG tablet Take 20 mg by mouth at bedtime.       . vitamin C (ASCORBIC ACID) 500 MG tablet Take 500 mg by mouth 2 (two) times daily.       . cephALEXin (KEFLEX) 500 MG capsule Take 1 capsule (500 mg total) by mouth 3 (three) times daily.  21 capsule  0    Physical Exam: BP 127/84  Pulse 76  Resp 18  Ht 5\' 9"  (1.753 m)  Wt 183 lb (83.008 kg)  BMI 27.02 kg/m2  SpO2 97%  Gen: no apparent distress Lungs: CTA bilaterally Heart: RRR Skin: small 1cm opening along superior portion of sternum, +pink granulation tissue present, no purulent discharge  Impression:  Mr. Aboud wound continues to progress nicely.  There is no purulent drainage present and the wound is no longer amenable to wound packing.  Plan  Patient  instructed to keep wound clean and dry.  He was instructed to call our office should the wound worsen or if purulent drainage develops.  We will bring him back to clinic in 2 weeks time for a wound check.

## 2011-10-16 ENCOUNTER — Ambulatory Visit (INDEPENDENT_AMBULATORY_CARE_PROVIDER_SITE_OTHER): Payer: Self-pay | Admitting: Cardiothoracic Surgery

## 2011-10-16 ENCOUNTER — Encounter: Payer: Self-pay | Admitting: Cardiothoracic Surgery

## 2011-10-16 ENCOUNTER — Other Ambulatory Visit: Payer: Self-pay | Admitting: Cardiothoracic Surgery

## 2011-10-16 VITALS — BP 153/79 | HR 71 | Resp 20 | Ht 69.0 in | Wt 183.0 lb

## 2011-10-16 DIAGNOSIS — Z952 Presence of prosthetic heart valve: Secondary | ICD-10-CM

## 2011-10-16 DIAGNOSIS — T8132XA Disruption of internal operation (surgical) wound, not elsewhere classified, initial encounter: Secondary | ICD-10-CM

## 2011-10-16 DIAGNOSIS — I359 Nonrheumatic aortic valve disorder, unspecified: Secondary | ICD-10-CM

## 2011-10-16 DIAGNOSIS — Z954 Presence of other heart-valve replacement: Secondary | ICD-10-CM

## 2011-10-16 MED ORDER — CIPROFLOXACIN HCL 500 MG PO TABS: 500.0000 mg | ORAL_TABLET | Freq: Two times a day (BID) | ORAL | Status: AC

## 2011-10-16 MED ORDER — SILVER SULFADIAZINE 1 % EX CREA: TOPICAL_CREAM | Freq: Two times a day (BID) | CUTANEOUS | Status: DC

## 2011-10-16 NOTE — Progress Notes (Addendum)
[address]            [place] [address] [address]          [phone]         Gregary Signs [place] Medical Record #[id number] Date of Birth: 02-14-1951  Donita Brooks, MD [name], MD  Chief Complaint:   PostOp Follow Up Visit 08/05/2011  DATE OF DISCHARGE:  OPERATIVE REPORT  PREOPERATIVE DIAGNOSIS: Critical aortic stenosis.  POSTOPERATIVE DIAGNOSIS: Critical Aortic Stenosis  SURGICAL PROCEDURE: Aortic valve replacement with pericardial tissue  valve, [employer], 23 mm, model 3300TFX, serial [id number].      History of Present Illness:     Patient is now  Almost 3   months postop following aortic valve replacement and doing well. His symptoms of shortness of breath with exertion and congestive heart flair have dramatically improved. He's increasing his physical activity appropriately. He comes in today with two areas of soft tissue erythemas  3cm on each side of sternal wound, areas he notes were he had tape on the skin, of weeks duration. No fever or chills     History  Smoking status  . Never Smoker   Smokeless tobacco  . Not on file       Allergies  Allergen Reactions  . Tape Rash    Current Outpatient Prescriptions  Medication Sig Dispense Refill  . amiodarone (PACERONE) 200 MG tablet Take 200 mg by mouth daily.      Marland Kitchen aspirin 325 MG EC tablet Take 1 tablet (325 mg total) by mouth daily.  30 tablet    . CINNAMON PO Take 2 capsules by mouth daily.      Marland Kitchen GARLIC PO Take 2-3 tablets by mouth 2 (two) times daily.       Marland Kitchen glipiZIDE (GLUCOTROL XL) 10 MG 24 hr tablet Take 10 mg by mouth daily.      Marland Kitchen losartan-hydrochlorothiazide (HYZAAR) 50-12.5 MG per tablet Take 1 tablet by mouth daily.       . metFORMIN (GLUCOPHAGE) 1000 MG tablet Take 1,000 mg by mouth 2 (two) times daily with a meal.      . metoprolol tartrate (LOPRESSOR) 25 MG tablet Take 1 tablet (25 mg total) by mouth 2 (two) times daily.   60 tablet  1  . Multiple Vitamin (MULITIVITAMIN WITH MINERALS) TABS Take 1 tablet by mouth daily.      . rosuvastatin (CRESTOR) 20 MG tablet Take 20 mg by mouth at bedtime.       . vitamin C (ASCORBIC ACID) 500 MG tablet Take 500 mg by mouth 2 (two) times daily.       . ciprofloxacin (CIPRO) 500 MG tablet Take 1 tablet (500 mg total) by mouth 2 (two) times daily.  28 tablet  0  . silver sulfADIAZINE (SILVADENE) 1 % cream Apply topically 2 (two) times daily.  50 g  0       Physical Exam: BP 153/79  Pulse 71  Resp 20  Ht 5\' 9"  (1.753 m)  Wt 183 lb (83.008 kg)  BMI 27.02 kg/m2  SpO2 98%  General appearance: alert, cooperative and no distress Neurologic: intact Heart: regular rate and rhythm, S1, S2 normal, no murmur, click, rub or gallop and normal apical impulse, I do not appreciate any murmur of aortic insufficiency  Lungs: clear to auscultation bilaterally and normal percussion bilaterally Abdomen: soft, non-tender; bowel sounds normal; no masses,  no organomegaly Extremities: extremities normal, atraumatic, no cyanosis or edema and Homans sign is negative, no sign of DVT Wound: Sternum is stable and well healed. On each side of sternal wound area or erythemas and small area of central necrosis   Diagnostic Studies & Laboratory data:         Recent Radiology Findings: No results found.    Recent Labs: Lab Results  Component Value Date   WBC 10.2 08/09/2011   HGB 11.1* 08/09/2011   HCT 32.1* 08/09/2011   PLT 117* 08/09/2011   GLUCOSE 83 08/11/2011   ALT 20 08/01/2011   AST 16 08/01/2011   NA 135 08/11/2011   K 4.0 08/11/2011   CL 98 08/11/2011   CREATININE 0.87 08/11/2011   BUN 20 08/11/2011   CO2 27 08/11/2011   TSH 2.525 08/08/2011   INR 1.61* 08/05/2011   HGBA1C 6.0* 08/01/2011      Assessment / Plan:     Overall the patient has been steadily improving since discharge from aortic valve replacement. I reviewed with him I plan to return to work, and reviewed postop  instructions including risks of endocarditis and antibacterial prophylaxis with a prosthetic valve. Now almost three months post op will d/c pacerone  Skin of chest cultured, started on Cipro and topical silvadene Return Monday for wound check     [name] MD 10/16/2011 10:34 AM

## 2011-10-16 NOTE — Patient Instructions (Signed)
Stop pacerone Start cipro and silvadene

## 2011-10-19 LAB — WOUND CULTURE
Gram Stain: NONE SEEN
Gram Stain: NONE SEEN
Gram Stain: NONE SEEN

## 2011-10-20 ENCOUNTER — Ambulatory Visit (INDEPENDENT_AMBULATORY_CARE_PROVIDER_SITE_OTHER): Payer: BC Managed Care – PPO | Admitting: Physician Assistant

## 2011-10-20 VITALS — BP 146/79 | HR 80 | Resp 18 | Ht 69.0 in | Wt 183.0 lb

## 2011-10-20 DIAGNOSIS — L0390 Cellulitis, unspecified: Secondary | ICD-10-CM | POA: Insufficient documentation

## 2011-10-20 DIAGNOSIS — Z952 Presence of prosthetic heart valve: Secondary | ICD-10-CM

## 2011-10-20 DIAGNOSIS — B9562 Methicillin resistant Staphylococcus aureus infection as the cause of diseases classified elsewhere: Secondary | ICD-10-CM | POA: Insufficient documentation

## 2011-10-20 DIAGNOSIS — A4902 Methicillin resistant Staphylococcus aureus infection, unspecified site: Secondary | ICD-10-CM

## 2011-10-20 DIAGNOSIS — Z954 Presence of other heart-valve replacement: Secondary | ICD-10-CM

## 2011-10-20 DIAGNOSIS — T8132XA Disruption of internal operation (surgical) wound, not elsewhere classified, initial encounter: Secondary | ICD-10-CM

## 2011-10-20 DIAGNOSIS — I359 Nonrheumatic aortic valve disorder, unspecified: Secondary | ICD-10-CM

## 2011-10-20 MED ORDER — CLINDAMYCIN HCL 300 MG PO CAPS: 300.0000 mg | ORAL_CAPSULE | Freq: Three times a day (TID) | ORAL | Status: AC

## 2011-10-20 NOTE — Progress Notes (Signed)
  HPI: Patient returns for wound check.  He was evaluated by Dr. Tyrone Sage on 10/16/2011 at which time he had 2 small area of soft tissue swelling with skin necrosis on the right and left side of his sternotomy.  A wound culture was sent and the patient was placed on Ciprofloxacin.  Currently, the patient is doing better.  He states the wound on the left side of his chest is almost completely healed.  The wound on the right side of his chest is still open and he states he had to use tweezers to pick out white material from the wound.  The patient denies fevers, chills, and sweats.   Current Outpatient Prescriptions  Medication Sig Dispense Refill  . aspirin 325 MG EC tablet Take 1 tablet (325 mg total) by mouth daily.  30 tablet    . CINNAMON PO Take 2 capsules by mouth daily.      . ciprofloxacin (CIPRO) 500 MG tablet Take 1 tablet (500 mg total) by mouth 2 (two) times daily.  28 tablet  0  . GARLIC PO Take 2-3 tablets by mouth 2 (two) times daily.       Marland Kitchen glipiZIDE (GLUCOTROL XL) 10 MG 24 hr tablet Take 10 mg by mouth daily.      Marland Kitchen losartan-hydrochlorothiazide (HYZAAR) 50-12.5 MG per tablet Take 1 tablet by mouth daily.       . metFORMIN (GLUCOPHAGE) 1000 MG tablet Take 1,000 mg by mouth 2 (two) times daily with a meal.      . metoprolol tartrate (LOPRESSOR) 25 MG tablet Take 1 tablet (25 mg total) by mouth 2 (two) times daily.  60 tablet  1  . Multiple Vitamin (MULITIVITAMIN WITH MINERALS) TABS Take 1 tablet by mouth daily.      . rosuvastatin (CRESTOR) 20 MG tablet Take 20 mg by mouth at bedtime.       . silver sulfADIAZINE (SILVADENE) 1 % cream Apply topically 2 (two) times daily.  50 g  0  . vitamin C (ASCORBIC ACID) 500 MG tablet Take 500 mg by mouth 2 (two) times daily.       . clindamycin (CLEOCIN) 300 MG capsule Take 1 capsule (300 mg total) by mouth 3 (three) times daily.  30 capsule  0    Physical Exam:  BP 146/79  Pulse 80  Resp 18  Ht 5\' 9"  (1.753 m)  Wt 183 lb (83.008 kg)  BMI  27.02 kg/m2  SpO2 97%  Gen: no apparent distress Heart: RRR Lungs: CTA bilaterally Skin: left soft tissue swelling resolved, <72mm eschar present, no erythema or drainage present  Right sided soft tissue area remains open with erythema present, + purulent drainage with manual expression  Diagnostic Tests:   Microbiology Wound Culure- + MRSA  Impression:  Darrell [name] has right sided MRSA skin cellulitis.  Overall the wounds have improved with almost complete resolution of the left side.  However, the right remains reddened with purulent drainage present.  He was given Cipro last week, however based on Sensitivities the infection is resistant to that medication.  Plan:  Patient will return to clinic on 10/30/2011 as scheduled.  I gave him a new prescription for Clindamycin 300mg  TID for 10 days, based on sensitivities.  He was instructed to continue wound cleansing daily and to call our office if the wound appears to be worsening.

## 2011-10-30 ENCOUNTER — Encounter: Payer: Self-pay | Admitting: Cardiothoracic Surgery

## 2011-11-03 ENCOUNTER — Ambulatory Visit (INDEPENDENT_AMBULATORY_CARE_PROVIDER_SITE_OTHER): Payer: Self-pay | Admitting: Cardiothoracic Surgery

## 2011-11-03 VITALS — BP 154/77 | HR 77 | Resp 18 | Ht 69.0 in | Wt 192.0 lb

## 2011-11-03 DIAGNOSIS — I359 Nonrheumatic aortic valve disorder, unspecified: Secondary | ICD-10-CM

## 2011-11-03 DIAGNOSIS — Z5189 Encounter for other specified aftercare: Secondary | ICD-10-CM

## 2011-11-03 NOTE — Patient Instructions (Signed)
Complete antiobiotics Return in 3 weeks to check wound

## 2011-11-03 NOTE — Progress Notes (Signed)
  HPI: Patient returns for wound check. Wound has greatly  improved.  The patient denies fevers, chills, and sweats.   Current Outpatient Prescriptions  Medication Sig Dispense Refill  . aspirin 325 MG EC tablet Take 1 tablet (325 mg total) by mouth daily.  30 tablet    . CINNAMON PO Take 2 capsules by mouth daily.      . clindamycin (CLEOCIN) 150 MG capsule Take 300 mg by mouth 3 (three) times daily.       Marland Kitchen GARLIC PO Take 2-3 tablets by mouth 2 (two) times daily.       Marland Kitchen glipiZIDE (GLUCOTROL XL) 10 MG 24 hr tablet Take 10 mg by mouth daily.      Marland Kitchen losartan-hydrochlorothiazide (HYZAAR) 50-12.5 MG per tablet Take 1 tablet by mouth daily.       . metFORMIN (GLUCOPHAGE) 1000 MG tablet Take 1,000 mg by mouth 2 (two) times daily with a meal.      . metoprolol tartrate (LOPRESSOR) 25 MG tablet Take 1 tablet (25 mg total) by mouth 2 (two) times daily.  60 tablet  1  . Multiple Vitamin (MULITIVITAMIN WITH MINERALS) TABS Take 1 tablet by mouth daily.      . rosuvastatin (CRESTOR) 20 MG tablet Take 20 mg by mouth at bedtime.       . silver sulfADIAZINE (SILVADENE) 1 % cream Apply topically 2 (two) times daily.  50 g  0  . vitamin C (ASCORBIC ACID) 500 MG tablet Take 500 mg by mouth 2 (two) times daily.         Physical Exam:  BP 154/77  Pulse 77  Resp 18  Ht 5\' 9"  (1.753 m)  Wt 192 lb (87.091 kg)  BMI 28.35 kg/m2  SpO2 98%  Gen: no apparent distress Heart: RRR Lungs: CTA bilaterally Skin: well healed incision, areas of infection on each side of would have cleared   Diagnostic Tests:   Microbiology Wound Culure- + MRSA  Impression:  Darrell [name] has right sided MRSA skin cellulitis.  Overall the wounds have improved with almost complete resolution of the left side.  However, the right remains reddened with purulent drainage present.  He was given Cipro last week, however based on Sensitivities the infection is resistant to that medication.  Plan:  Complete course of  antibiotics Return for wound check 3 weeks

## 2011-11-10 ENCOUNTER — Other Ambulatory Visit: Payer: Self-pay | Admitting: Physician Assistant

## 2011-11-27 ENCOUNTER — Ambulatory Visit: Payer: BC Managed Care – PPO | Admitting: Cardiothoracic Surgery

## 2011-12-04 ENCOUNTER — Encounter: Payer: Self-pay | Admitting: Cardiothoracic Surgery

## 2011-12-04 ENCOUNTER — Ambulatory Visit (INDEPENDENT_AMBULATORY_CARE_PROVIDER_SITE_OTHER): Payer: Self-pay | Admitting: Cardiothoracic Surgery

## 2011-12-04 VITALS — BP 164/79 | HR 72 | Resp 18 | Ht 69.0 in | Wt 192.0 lb

## 2011-12-04 DIAGNOSIS — I359 Nonrheumatic aortic valve disorder, unspecified: Secondary | ICD-10-CM

## 2011-12-04 DIAGNOSIS — Z952 Presence of prosthetic heart valve: Secondary | ICD-10-CM

## 2011-12-04 DIAGNOSIS — T81329A Deep disruption or dehiscence of operation wound, unspecified, initial encounter: Secondary | ICD-10-CM

## 2011-12-04 DIAGNOSIS — Z954 Presence of other heart-valve replacement: Secondary | ICD-10-CM

## 2011-12-04 DIAGNOSIS — T8132XA Disruption of internal operation (surgical) wound, not elsewhere classified, initial encounter: Secondary | ICD-10-CM

## 2011-12-04 NOTE — Progress Notes (Signed)
                   [address]            [name] [address]          [phone]     HPI: Patient returns for wound check. Wound has greatly  improved.  The patient denies fevers, chills, and sweats.   Current Outpatient Prescriptions  Medication Sig Dispense Refill  . aspirin 325 MG EC tablet Take 1 tablet (325 mg total) by mouth daily.  30 tablet    . CINNAMON PO Take 2 capsules by mouth daily.      Marland Kitchen GARLIC PO Take 2-3 tablets by mouth 2 (two) times daily.       Marland Kitchen glipiZIDE (GLUCOTROL XL) 10 MG 24 hr tablet Take 10 mg by mouth daily.      Marland Kitchen losartan-hydrochlorothiazide (HYZAAR) 50-12.5 MG per tablet Take 1 tablet by mouth daily.       . metFORMIN (GLUCOPHAGE) 1000 MG tablet Take 1,000 mg by mouth 2 (two) times daily with a meal.      . metoprolol tartrate (LOPRESSOR) 25 MG tablet Take 1 tablet (25 mg total) by mouth 2 (two) times daily.  60 tablet  1  . Multiple Vitamin (MULITIVITAMIN WITH MINERALS) TABS Take 1 tablet by mouth daily.      . rosuvastatin (CRESTOR) 20 MG tablet Take 20 mg by mouth at bedtime.       . vitamin C (ASCORBIC ACID) 500 MG tablet Take 500 mg by mouth 2 (two) times daily.         Physical Exam:  BP 164/79  Pulse 72  Resp 18  Ht 5\' 9"  (1.753 m)  Wt 192 lb (87.091 kg)  BMI 28.35 kg/m2  SpO2 97%  Gen: no apparent distress Heart: RRR Lungs: CTA bilaterally Skin: well healed incision, areas of infection on each side of would have cleared No murmur on exam  Diagnostic Tests:   Microbiology Wound Culure- + MRSA  Impression: \ Sternum well healed  Will see prn Ok to precede with back surgery from cardiac surgery point of view  [name] MD  Beeper [phone] Office [phone] 12/04/2011 2:50 PM

## 2011-12-04 NOTE — Patient Instructions (Addendum)
Do ing well  Wound well wound well healed  Endocarditis Information  You may be at risk for developing endocarditis since you have  an artificial heart valve  or a repaired heart valve. Endocarditis is an infection of the lining of the heart or heart valves.   Certain surgical and dental procedures may put you at risk,  such as teeth cleaning or other dental procedures or any surgery involving the respiratory, urinary, gastrointestinal tract, gallbladder or prostate.   Notify your doctor or dentist before having any invasive procedures. You will need to take antibiotics before certain procedures.   To prevent endocarditis, maintain good oral health. Seek prompt medical attention for any mouth/gum, skin or urinary tract infections.

## 2012-02-09 ENCOUNTER — Telehealth: Payer: Self-pay | Admitting: Vascular Surgery

## 2012-02-09 NOTE — Telephone Encounter (Addendum)
Message copied by Shari Prows on Mon Feb 09, 2012  2:08 PM ------      Message from: Phillips Odor      Created: Mon Feb 09, 2012  1:21 PM      Regarding: needs appt  w/ CSD       This pt. neds a new pt. Consult, with CSD, for evaluation prior to ALIF scheduled for 04/01/12.  Please remind pt. to bring copy of L-S films with him to appt. Thank You!  I scheduled an appt for the above pt for 03/24/12 at 1pm w/ csd. The pt is aware of the appt and knows to bring films. I mailed NP pw to him as well. awt

## 2012-02-10 ENCOUNTER — Other Ambulatory Visit: Payer: Self-pay | Admitting: Orthopedic Surgery

## 2012-03-12 ENCOUNTER — Other Ambulatory Visit: Payer: Self-pay

## 2012-03-18 ENCOUNTER — Encounter (HOSPITAL_COMMUNITY): Payer: Self-pay | Admitting: Pharmacy Technician

## 2012-03-23 ENCOUNTER — Other Ambulatory Visit: Payer: Self-pay | Admitting: Orthopedic Surgery

## 2012-03-23 ENCOUNTER — Encounter: Payer: Self-pay | Admitting: Vascular Surgery

## 2012-03-23 NOTE — H&P (Signed)
Darrell [name] DOB: Jul 25, 1950 Married / Language: Undefined / Race: Undefined Male  Chief Complaint: Chronic low back pain  History of Present Illness The patient is a 62 year old male who comes in today for a preoperative History and Physical. The patient is scheduled for a ALIF L5-S1 to be performed by Dr. Javier Docker, MD at [facility] on 04/01/2012 . Note for "H & P": They are now approximately 11 years out from injury. Symptoms reported today include lower back pain. Current treatment includes: pain medications. The following medication has been used for pain control: Tylenol and Ultram. The patient reports their current pain level to be 5 / 10. The patient is currently working with light duty work restrictions of no lifting over 25 lbs and no prolonged standing or sitting. Surgery was initially discussed in Feb 2013 but the patient underwent an aortic valve replacement by Dr. Lowella Fairy which delayed scheduling of his lumbar surgery. The patient was last seen by Dr. Shelle Iron in September 2013 and surgery was discussed, pending cardiac clearance. He has obtained pre-op clearance from Dr. Bryan Lemma of the [facility], Sheliah Plane of [facility], and PCP Dr. Tanya Nones.  Dr. Shelle Iron and the patient previously discussed the options including ALIF. This would require clearance given his recent aortic surgery as to whether there are any contraindications to the anterior approach. No calcifications distally. No scarring. That would be the only issue in terms of that. I gave him a prescription for Bactroban and Phisohex wash. We will use Vancomycin pre-operatively.  Risks and benefits of the anterior lumbar fusion were discussed with the patient including bleeding, infection, damage to neurovascular structures, nonunion, hardware failure, need for repositioning, autograft, allograft, bone graft, DVT, PE, anesthetic complication, extended  time to union as well as the inability to perform the procedure.  We utilized illustrated drawings to discuss the implant.   He will continue with light duties, no lifting over 25 pounds. Pain medication as needed. He has reported less pain since he has not been working. We also had a discussion concerning the risks and benefits of that procedure. He wants to proceed.  Pt has an appt scheduled with Dr. Edilia Bo tomorrow.   Past Medical History Prostate Disease Heart murmur High blood pressure Hypercholesterolemia Diabetes Mellitus, Type II Chronic Pain Degeneration, lumbar/lumbosacral disc  Sprain/strain, lumbar region  Lumbago Dentures Myocardial infarction Valvular heart disease Hiatal Hernia Hemorrhoids Diverticulosis Staphlococcus Infections. prior MRSA infx   Allergies No Known Drug Allergies. 11/19/2010 Reports allergy to brown bandages but not tape   Family History Congestive Heart Failure. mother Diabetes Mellitus. mother Heart Disease. mother Cancer. mother Heart disease in male family member before age 31 Hypertension. mother Siblings. lung disease, brother   Social History Drug/Alcohol Rehab (Currently). no Exercise. Exercises daily; does running / walking Illicit drug use. no Current work status. [employer] supervisor, out of work Alcohol use. never consumed alcohol Children. 3 Living situation. live with spouse Previously in rehab. no Tobacco use. Never smoker. never smoker Pain Contract. no Marital status. married Most recent primary occupation. Supervisor at [employer] Number of flights of stairs before winded. 2-3 Post-Surgical Plans. home Advance Directives. none   Medication History GlipiZIDE (10MG  Tablet ER, Oral daily) Active. MetFORMIN HCl (1000MG  Tablet, Oral two times daily) Active. Aspirin (325MG  Tablet, 1 (one) Oral daily) Active. Metoprolol Tartrate (25MG  Tablet, Oral two times daily) Active. Crestor (20MG  Tablet,  Oral daily) Active. Ester-C ( Oral daily) Active.  Garlic (1000MG  Capsule, 2 Oral two times daily) Active. Vitamin E Complex (1000UNIT Capsule, Oral at bedtime) Active. Cinnamon (500MG  Capsule, Oral as needed) Active. Losartan Potassium (25MG  Tablet, Oral daily) Active. Ultram (50MG  Tablet, Oral every six to eight hours, as needed) Active. Medications Reconciled.   Past Surgical History Colon Polyp Removal - Colonoscopy Straighten Nasal Septum Replace Aortic Valve Coronary Artery Bypass Graft    Review of Systems General:Not Present- Chills, Fever, Night Sweats, Fatigue, Weight Gain, Weight Loss and Memory Loss. Skin:Present- Itching (dry skin, likely due to weather/winter per pt). Not Present- Hives, Rash, Eczema and Lesions. HEENT:Present- Dentures. Not Present- Tinnitus, Headache, Double Vision, Visual Loss and Hearing Loss. Respiratory:Not Present- Shortness of breath with exertion, Shortness of breath at rest, Allergies, Coughing up blood and Chronic Cough. Cardiovascular:Not Present- Chest Pain, Racing/skipping heartbeats, Difficulty Breathing Lying Down, Murmur, Swelling and Palpitations. Gastrointestinal:Not Present- Bloody Stool, Heartburn, Abdominal Pain, Vomiting, Nausea, Constipation, Diarrhea, Difficulty Swallowing, Jaundice and Loss of appetitie. Male Genitourinary:Present- Weak urinary stream and Urinating at Night. Not Present- Urinary frequency, Blood in Urine, Discharge, Flank Pain, Incontinence, Painful Urination, Urgency and Urinary Retention. Musculoskeletal:Present- Back Pain and Morning Stiffness. Not Present- Muscle Weakness, Muscle Pain, Joint Swelling, Joint Pain and Spasms. Neurological:Not Present- Tremor, Dizziness, Blackout spells, Paralysis, Difficulty with balance and Weakness. Psychiatric:Not Present- Insomnia.   Vitals 03/23/2012 9:07 AM Weight: 199 lb Height: 69 in Weight was reported by patient. Height was reported by  patient. Body Surface Area: 2.1 m Body Mass Index: 29.39 kg/m Pulse: 69 (Regular) BP: 163/82 (Sitting, Left Arm, Standard)    Physical Exam The physical exam findings are as follows:  General Mental Status - Alert, cooperative and good historian. General Appearance- pleasant. Not in acute distress. Orientation- Oriented X3. Build & Nutrition- Well nourished and Well developed.  Head and Neck Head- normocephalic, atraumatic . Neck Global Assessment- supple. no bruit auscultated on the right and no bruit auscultated on the left.  Eye Pupil- Bilateral- PERRLA. Motion- Bilateral- EOMI.  Chest and Lung Exam Auscultation: Breath sounds:- clear at anterior chest wall and - clear at posterior chest wall. Adventitious sounds:- No Adventitious sounds.  Cardiovascular Auscultation:Rhythm- Regular rate and rhythm. Heart Sounds- S1 WNL and S2 WNL. Murmurs & Other Heart Sounds:Auscultation of the heart reveals - No Murmurs.  Abdomen Inspection: Skin:Inspection of the skin of the abdomen reveals - Note: no scars, erythema, ecchymosis, rashes Palpation/Percussion:Tenderness- Abdomen is non-tender to palpation. Rigidity (guarding)- Abdomen is soft. Auscultation:Auscultation of the abdomen reveals - Bowel sounds normal.  Male Genitourinary Not done, not pertinent to present illness  Musculoskeletal Note: The chest incision appears to be well healed without palpable fluctuance.  Inspection of the cervical spine reveals a normal lordosis without evidence of paraspinous spasms or soft tissue swelling. Nontender to palpation. He has some discomfort with end forward flexion of the cervical spine. Extension combined with lateral flexion does not reproduce pain. Negative impingement sign, negative secondary impingement sign of the shoulders. Negative Tinel's at median and ulnar nerves at the elbow. Negative carpal compression test at the wrists. Motor of  the upper extremities is 5/5 including biceps, triceps, brachioradialis,wrist flexion, wrist extension, finger flexion, finger extension. Reflexes are normoreflexic. Sensory exam is intact to light touch. There is no Hoffmann sign. Nontender over the thoracic spine.  Lumbar spine exam reveals no evidence of soft tissue swelling, ecchymosis or deformity. The abdomen is soft and nontender. Nontender over the trochanters. No cellulitis or lymphadenopathy.  He has limited flexion and extension of the  lumbar spine, left and lateral rotation. Straight leg raise produces back pain buttock pain. Motor is 5/5 including EHL, tibialis anterior, plantar flexion, quadriceps and hamstrings. Patient is normoreflexic. There is no Babinski or clonus. Sensory exam is intact to light touch. The patient has good distal pulses. No DVT. No pain and normal range of motion without instability of the hips, knees and ankles.   Assessment & Plan Lumbago (724.2) Degeneration, lumbar/lumbosacral disc (722.52) Impression: Chronic progressive discogenic mechanical back pain secondary to progressive disc degeneration L5-S1.   Three view X-rays Lspine demonstrate severe disc degeneration L5-S1 with a vacuum phenomena, neural foraminal stenosis. No instability with flexion or extension.  MRI Lspine with severe DDD L5-S1, modic changes, neural foraminal narrowing, non-compressive disc extrusion.  Pt scheduled for ALIF L5-S1 04/01/12 at [facility] by Dr. Shelle Iron. Risks and benefits of the anterior lumbar fusion were discussed with the patient including bleeding, infection, damage to neurovascular structures, nonunion, hardware failure, need for repositioning, autograft, allograft, bone graft, DVT, PE, anesthetic complication, extended time to union as well as the inability to perform the procedure. Pt questions answered and he desires to proceed. He is scheduled for an appt with Dr. Edilia Bo tomorrow as he will be assisting Dr.  Shelle Iron with anterior approach. He will present to the hospital for pre-op testing. He has been cleared medically for surgery as noted above (Dr. Tanya Nones, Dr. Tyrone Sage, and Dr. Herbie [place]). Was given new Rx for bactroban and chlorhexidine wash for decolonization as prior hx of MRSA. Will use vancomycin pre-op. Remain NPO after MN night before surgery. Continue Ultram prn pain. Discussed expected post-op protocol and outcome. Will be fitted with a Boa Brace post-op. Discussed limitations and restrictions. Hold ASA and vitamins, supplements, NSAIDs x 1 week pre-op. Call with any questions or concerns. Follow up 10-14 days post-op for suture removal.   Signed electronically by Dorothy Spark PA-C (03/23/2012 1:22 PM)

## 2012-03-24 ENCOUNTER — Encounter (HOSPITAL_COMMUNITY)
Admission: RE | Admit: 2012-03-24 | Discharge: 2012-03-24 | Disposition: A | Discharge status: Home / Self Care | Payer: Worker's Compensation | Source: Ambulatory Visit | Attending: Specialist | Admitting: Specialist

## 2012-03-24 ENCOUNTER — Encounter (HOSPITAL_COMMUNITY): Payer: Self-pay

## 2012-03-24 ENCOUNTER — Encounter: Payer: Self-pay | Admitting: Vascular Surgery

## 2012-03-24 ENCOUNTER — Ambulatory Visit (INDEPENDENT_AMBULATORY_CARE_PROVIDER_SITE_OTHER): Payer: Worker's Compensation | Admitting: Vascular Surgery

## 2012-03-24 VITALS — BP 131/68 | HR 79 | Ht 69.0 in | Wt 203.2 lb

## 2012-03-24 DIAGNOSIS — M5137 Other intervertebral disc degeneration, lumbosacral region: Secondary | ICD-10-CM

## 2012-03-24 DIAGNOSIS — M5136 Other intervertebral disc degeneration, lumbar region: Secondary | ICD-10-CM

## 2012-03-24 LAB — COMPREHENSIVE METABOLIC PANEL
Albumin: 4.3 g/dL (ref 3.5–5.2)
Alkaline Phosphatase: 41 U/L (ref 39–117)
BUN: 21 mg/dL (ref 6–23)
Chloride: 102 mEq/L (ref 96–112)
Creatinine, Ser: 0.79 mg/dL (ref 0.50–1.35)
GFR calc Af Amer: >90 mL/min (ref >90)
Glucose, Bld: 77 mg/dL (ref 70–99)
Total Bilirubin: 0.3 mg/dL (ref 0.3–1.2)

## 2012-03-24 LAB — CBC
HCT: 42.2 % (ref 39.0–52.0)
Hemoglobin: 14.6 g/dL (ref 13.0–17.0)
MCHC: 34.6 g/dL (ref 30.0–36.0)
MCV: 93.4 fL (ref 78.0–100.0)
RDW: 12.7 % (ref 11.5–15.5)

## 2012-03-24 LAB — PROTIME-INR
INR: 0.98 (ref 0.00–1.49)
Prothrombin Time: 12.9 seconds (ref 11.6–15.2)

## 2012-03-24 LAB — TYPE AND SCREEN: ABO/RH(D): A POS

## 2012-03-24 IMAGING — CR DG LUMBAR SPINE 2-3V
2 series · 2 of 2 positions shown · non-contrast
Comparison: None

CLINICAL DATA: Degenerative disc disease in the lumbar spine.

LUMBAR SPINE - 2-3 VIEW

[view not recorded (1 of 2)]
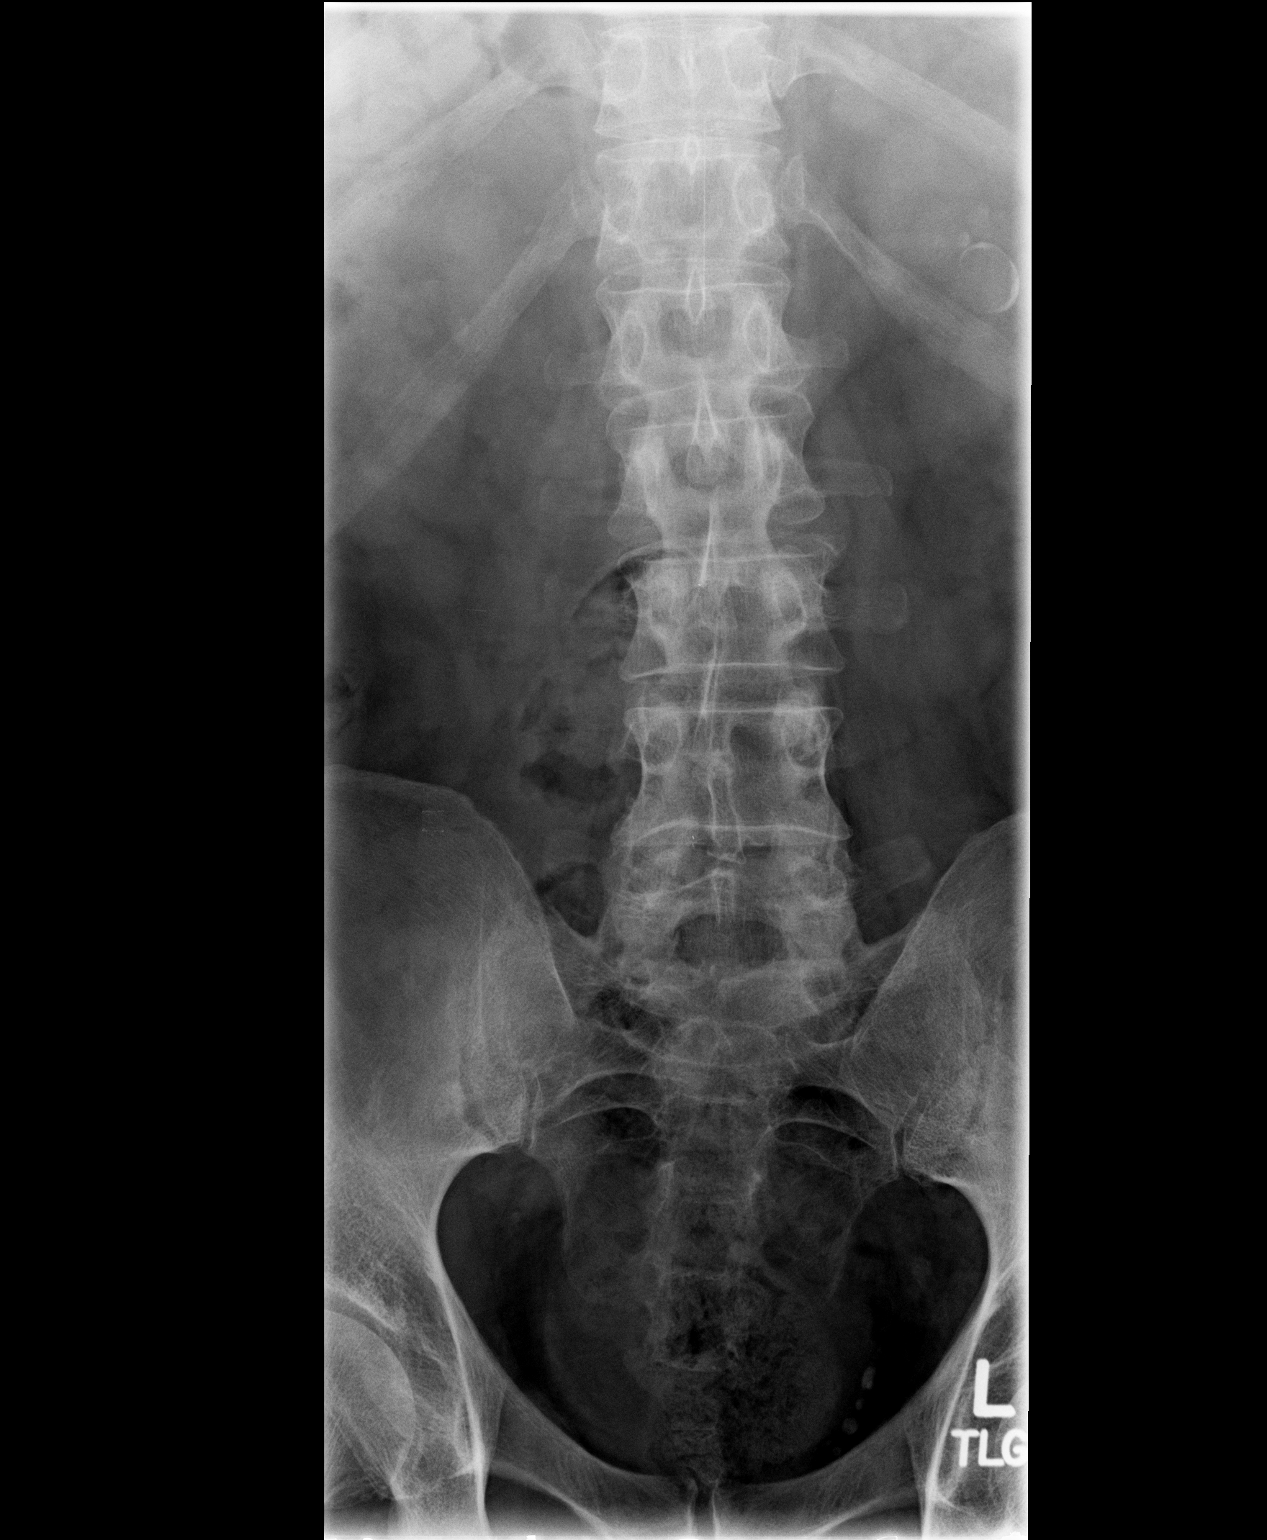

[view not recorded (2 of 2)]
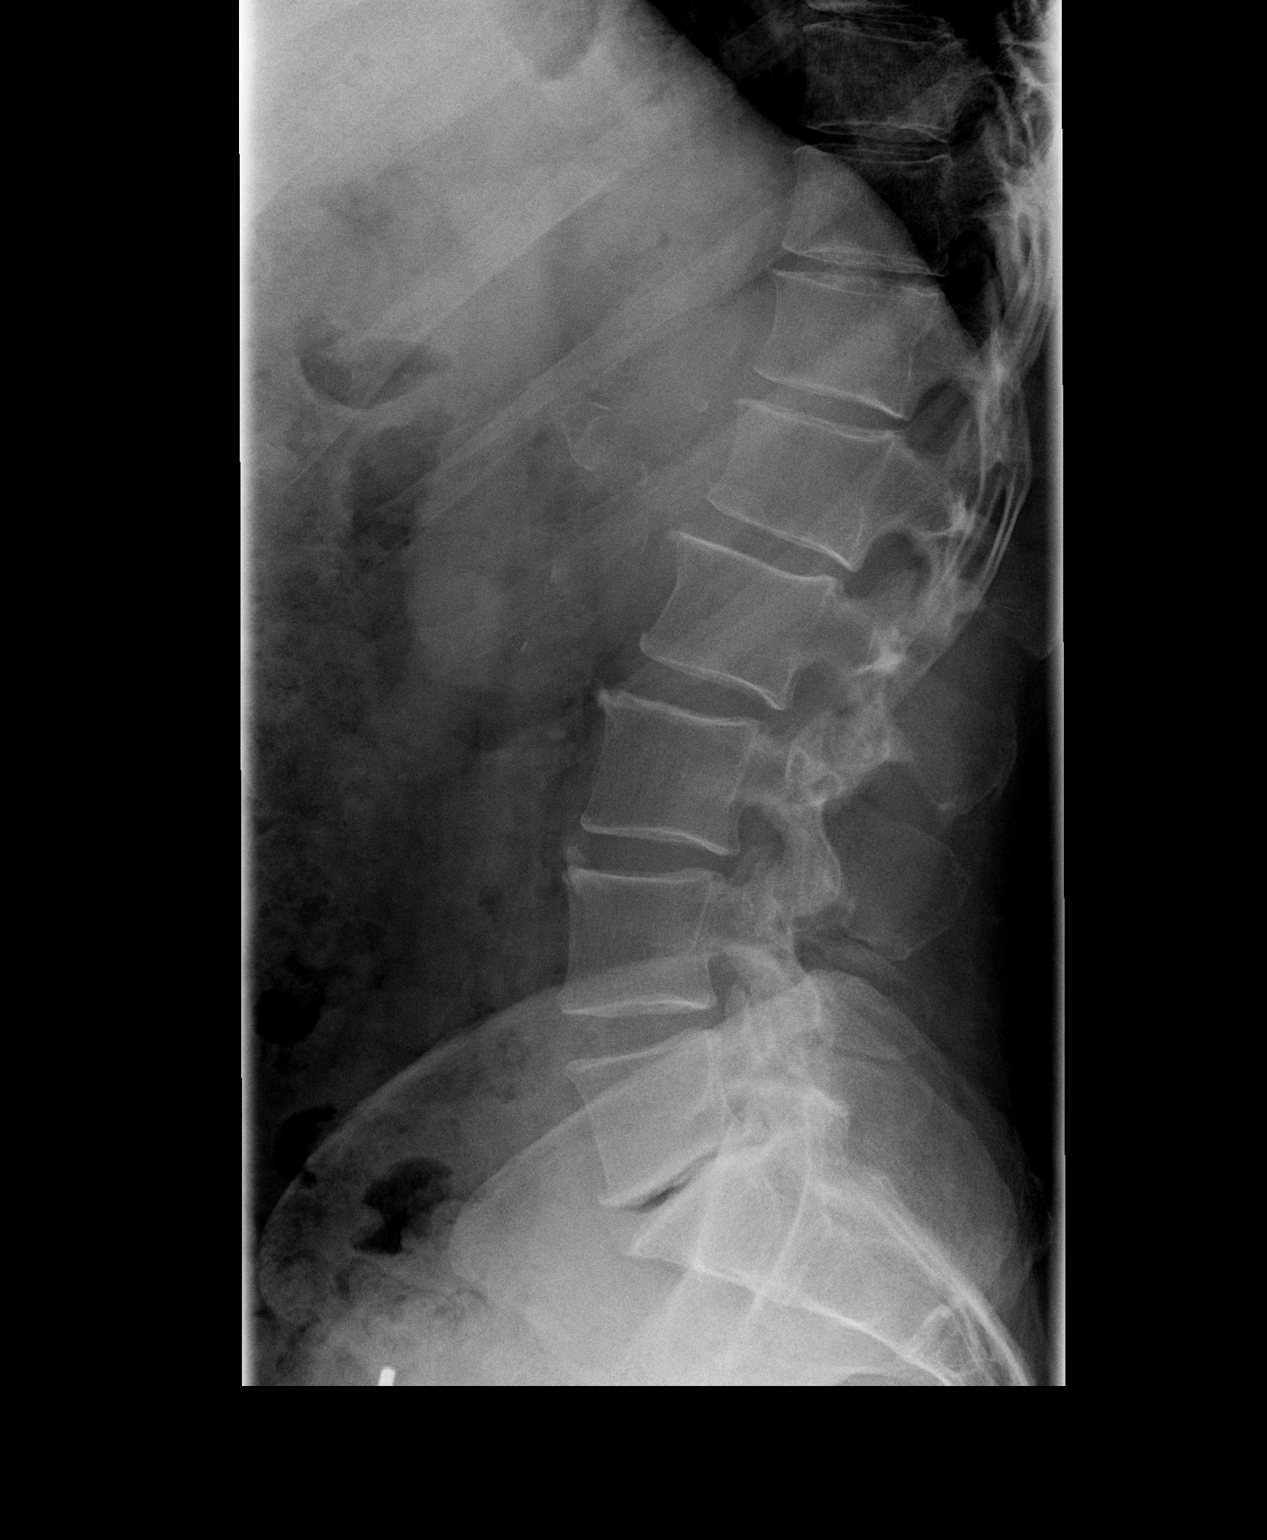

[2 of 2 positions shown; findings below may reference images not displayed]

FINDINGS: There is marked narrowing of the L5-S1 disc space.
Slight facet arthritis at L4-5 on the right.

Slight retrolisthesis of L2 on L3 and of L3 on L4.

Vascular calcifications in the splenic artery.
IMPRESSION: Degenerative disc disease at L5 S1.

## 2012-03-24 NOTE — Pre-Procedure Instructions (Signed)
TAYSON SCHNELLE  03/24/2012   Your procedure is scheduled on:  Thursday, February 6  Report to [facility] at 0530 AM.  Call this number if you have problems the morning of surgery: [phone]   Remember:   Do not eat food or drink liquids after midnight.Wednesday night   Take these medicines the morning of surgery with A SIP OF WATER: Metoprolol,Tramadol   Do not wear jewelry, make-up or nail polish.  Do not wear lotions, powders, or perfumes. You may wear deodorant.  Do not shave 48 hours prior to surgery. Men may shave face and neck.  Do not bring valuables to the hospital.  Contacts, dentures or bridgework may not be worn into surgery.  Leave suitcase in the car. After surgery it may be brought to your room.  For patients admitted to the hospital, checkout time is 11:00 AM the day of  discharge.   Special Instructions: Incentive Spirometry - Practice and bring it with you on the day of surgery. Shower using CHG 2 nights before surgery and the night before surgery.  If you shower the day of surgery use CHG.  Use special wash - you have one bottle of CHG for all showers.  You should use approximately 1/3 of the bottle for each shower.   Please read over the following fact sheets that you were given: Pain Booklet, Coughing and Deep Breathing, Blood Transfusion Information, MRSA Information and Surgical Site Infection Prevention
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
## 2012-03-31 MED ORDER — VANCOMYCIN HCL IN DEXTROSE 1-5 GM/200ML-% IV SOLN
1000.0000 mg | INTRAVENOUS | Status: AC
  Administered 2012-04-01: 1000 mg via INTRAVENOUS
  Filled 2012-03-31: qty 200

## 2012-03-31 MED ORDER — CHLORHEXIDINE GLUCONATE 4 % EX LIQD: 60.0000 mL | Freq: Once | CUTANEOUS | Status: DC

## 2012-04-01 ENCOUNTER — Inpatient Hospital Stay (HOSPITAL_COMMUNITY): Payer: Worker's Compensation

## 2012-04-01 ENCOUNTER — Encounter (HOSPITAL_COMMUNITY): Payer: Self-pay | Admitting: Vascular Surgery

## 2012-04-01 ENCOUNTER — Inpatient Hospital Stay (HOSPITAL_COMMUNITY): Payer: Worker's Compensation | Admitting: Vascular Surgery

## 2012-04-01 ENCOUNTER — Inpatient Hospital Stay (HOSPITAL_COMMUNITY)
Admission: RE | Admit: 2012-04-01 | Discharge: 2012-04-03 | DRG: 460 | Disposition: A | Discharge status: Home Health | Payer: Worker's Compensation | Source: Ambulatory Visit | Attending: Specialist | Admitting: Specialist

## 2012-04-01 ENCOUNTER — Encounter (HOSPITAL_COMMUNITY)
Admission: RE | Disposition: A | Discharge status: Home Health | Payer: Self-pay | Source: Ambulatory Visit | Attending: Specialist

## 2012-04-01 ENCOUNTER — Encounter (HOSPITAL_COMMUNITY): Payer: Self-pay | Admitting: *Deleted

## 2012-04-01 DIAGNOSIS — E785 Hyperlipidemia, unspecified: Secondary | ICD-10-CM | POA: Diagnosis present

## 2012-04-01 DIAGNOSIS — Z8614 Personal history of Methicillin resistant Staphylococcus aureus infection: Secondary | ICD-10-CM

## 2012-04-01 DIAGNOSIS — F329 Major depressive disorder, single episode, unspecified: Secondary | ICD-10-CM | POA: Diagnosis present

## 2012-04-01 DIAGNOSIS — K219 Gastro-esophageal reflux disease without esophagitis: Secondary | ICD-10-CM | POA: Diagnosis present

## 2012-04-01 DIAGNOSIS — N400 Enlarged prostate without lower urinary tract symptoms: Secondary | ICD-10-CM | POA: Diagnosis present

## 2012-04-01 DIAGNOSIS — M51369 Other intervertebral disc degeneration, lumbar region without mention of lumbar back pain or lower extremity pain: Secondary | ICD-10-CM

## 2012-04-01 DIAGNOSIS — K5730 Diverticulosis of large intestine without perforation or abscess without bleeding: Secondary | ICD-10-CM | POA: Diagnosis present

## 2012-04-01 DIAGNOSIS — Z79899 Other long term (current) drug therapy: Secondary | ICD-10-CM

## 2012-04-01 DIAGNOSIS — M549 Dorsalgia, unspecified: Secondary | ICD-10-CM

## 2012-04-01 DIAGNOSIS — K449 Diaphragmatic hernia without obstruction or gangrene: Secondary | ICD-10-CM | POA: Diagnosis present

## 2012-04-01 DIAGNOSIS — F3289 Other specified depressive episodes: Secondary | ICD-10-CM | POA: Diagnosis present

## 2012-04-01 DIAGNOSIS — E119 Type 2 diabetes mellitus without complications: Secondary | ICD-10-CM | POA: Diagnosis present

## 2012-04-01 DIAGNOSIS — M5136 Other intervertebral disc degeneration, lumbar region: Secondary | ICD-10-CM

## 2012-04-01 DIAGNOSIS — M51379 Other intervertebral disc degeneration, lumbosacral region without mention of lumbar back pain or lower extremity pain: Principal | ICD-10-CM | POA: Diagnosis present

## 2012-04-01 DIAGNOSIS — M5137 Other intervertebral disc degeneration, lumbosacral region: Principal | ICD-10-CM | POA: Diagnosis present

## 2012-04-01 DIAGNOSIS — G4700 Insomnia, unspecified: Secondary | ICD-10-CM | POA: Diagnosis present

## 2012-04-01 DIAGNOSIS — M129 Arthropathy, unspecified: Secondary | ICD-10-CM | POA: Diagnosis present

## 2012-04-01 HISTORY — PX: ABDOMINAL EXPOSURE: SHX5708

## 2012-04-01 HISTORY — PX: ANTERIOR LUMBAR FUSION: SHX1170

## 2012-04-01 LAB — CBC
HCT: 39.1 % (ref 39.0–52.0)
Hemoglobin: 13.6 g/dL (ref 13.0–17.0)
MCH: 32.8 pg (ref 26.0–34.0)
MCV: 94.2 fL (ref 78.0–100.0)
RBC: 4.15 MIL/uL — ABNORMAL LOW (ref 4.22–5.81)

## 2012-04-01 LAB — GLUCOSE, CAPILLARY
Glucose-Capillary: 154 mg/dL — ABNORMAL HIGH (ref 70–99)
Glucose-Capillary: 129 mg/dL — ABNORMAL HIGH (ref 70–99)

## 2012-04-01 IMAGING — RF DG LUMBAR SPINE 2-3V
1 series · 2 of 2 positions shown · non-contrast
Comparison: Plain films lumbar spine [DATE].

CLINICAL DATA: Anterior L5-S1 fusion.

DG C-ARM 61-120 MIN, LUMBAR SPINE - 2-3 VIEW
TECHNIQUE: Two fluoroscopic spot views of the lower lumbar spine
provided.

[Series 1: run · 2 of 2 slices shown]
[im 1/2]
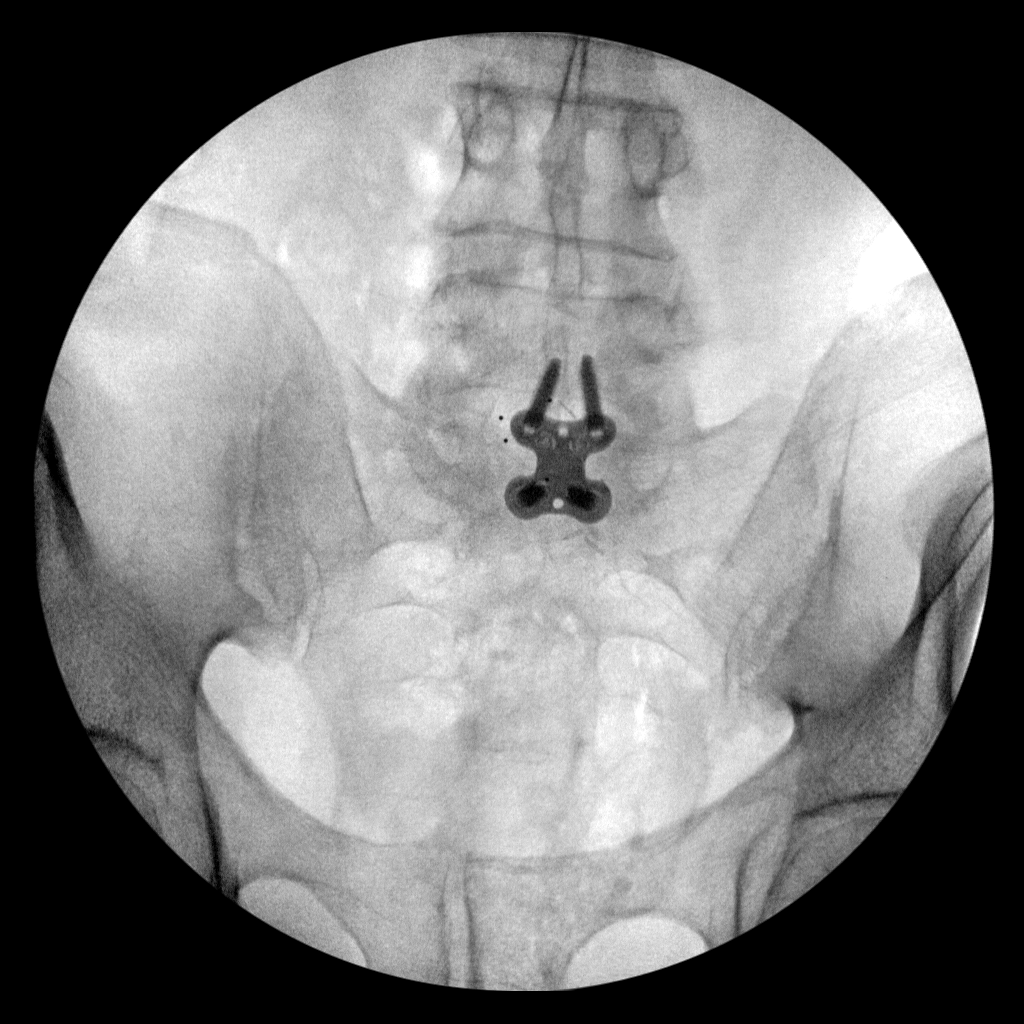
[im 2/2]
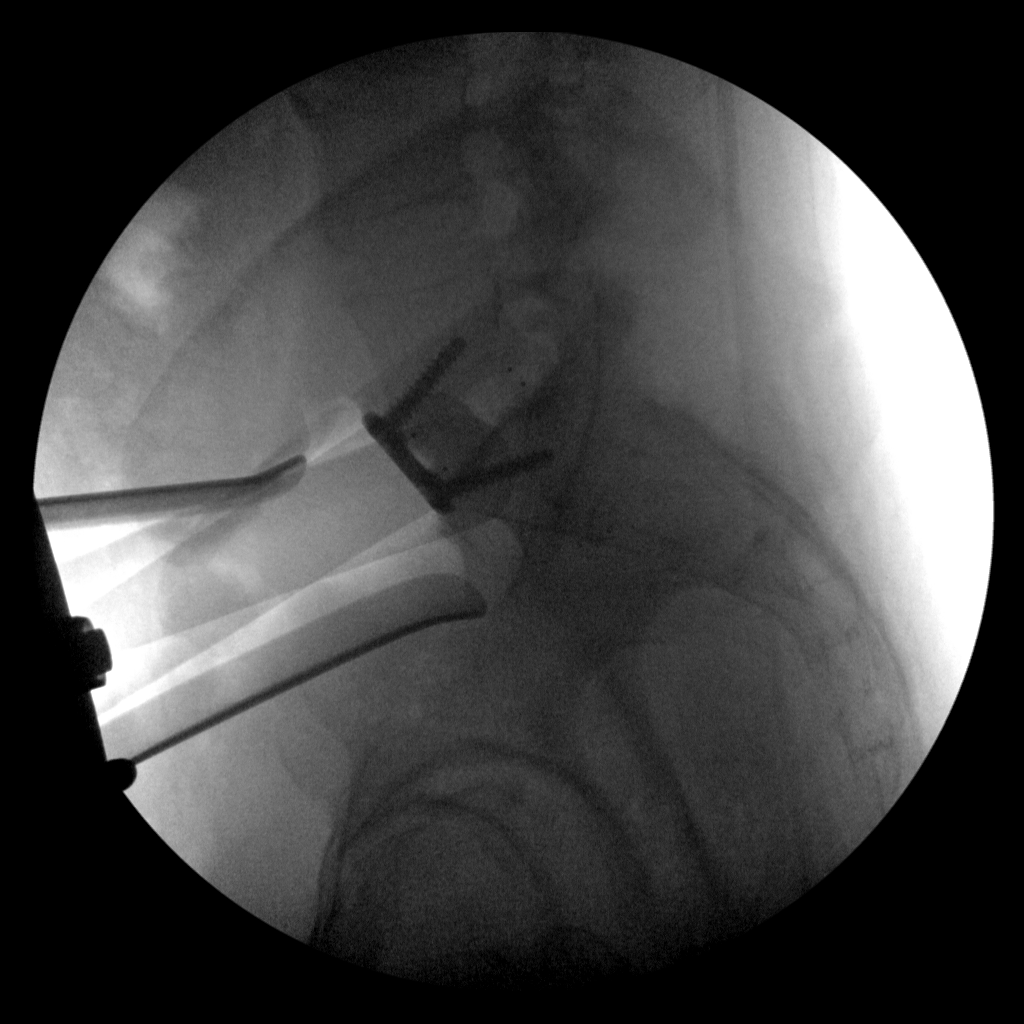

[2 of 2 positions shown; findings below may reference images not displayed]

FINDINGS: Images demonstrate placement anterior plate and screws
and interbody spacer at L5-S1.  No acute finding is identified.
IMPRESSION: Anterior L5-S1 fusion.

## 2012-04-01 IMAGING — CR DG OR LOCAL ABDOMEN
2 series · 2 of 2 positions shown · non-contrast
Comparison: None.

CLINICAL DATA: Anterior fusion L5-S1.

OR LOCAL ABDOMEN

[AP (1 of 2)]
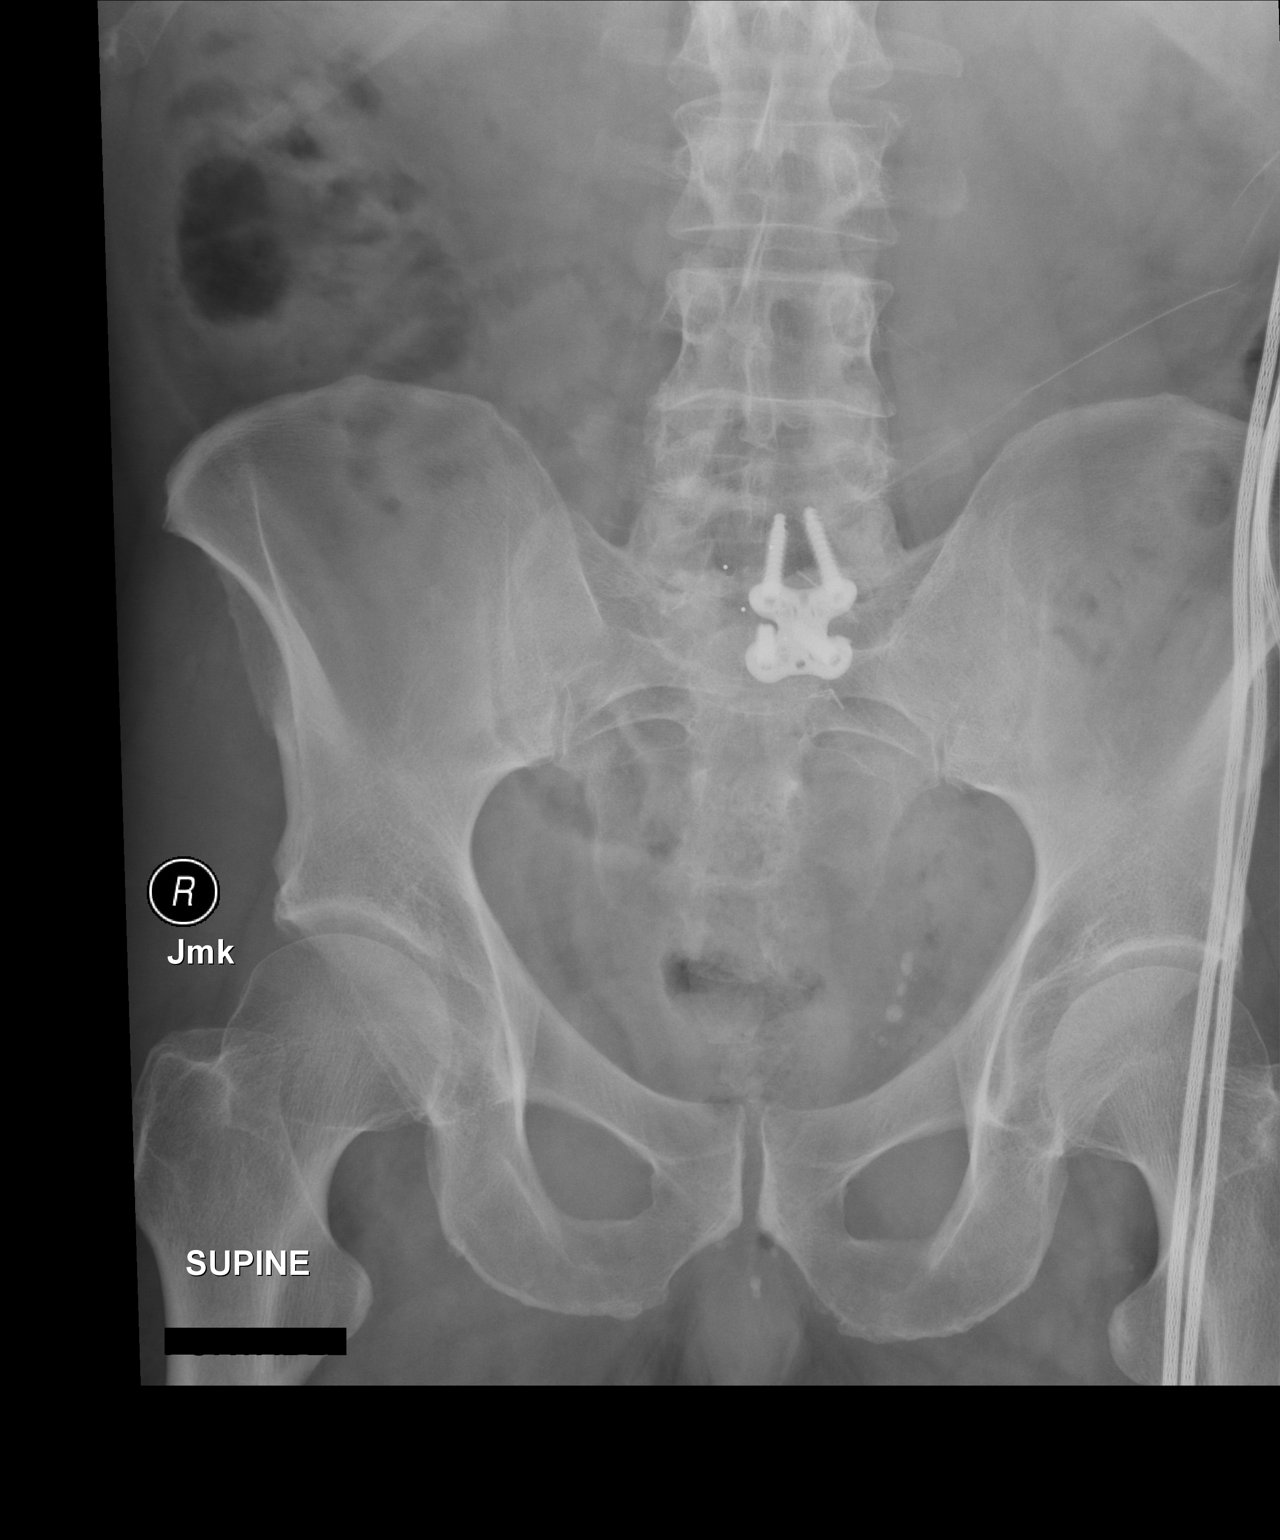

[AP (2 of 2)]
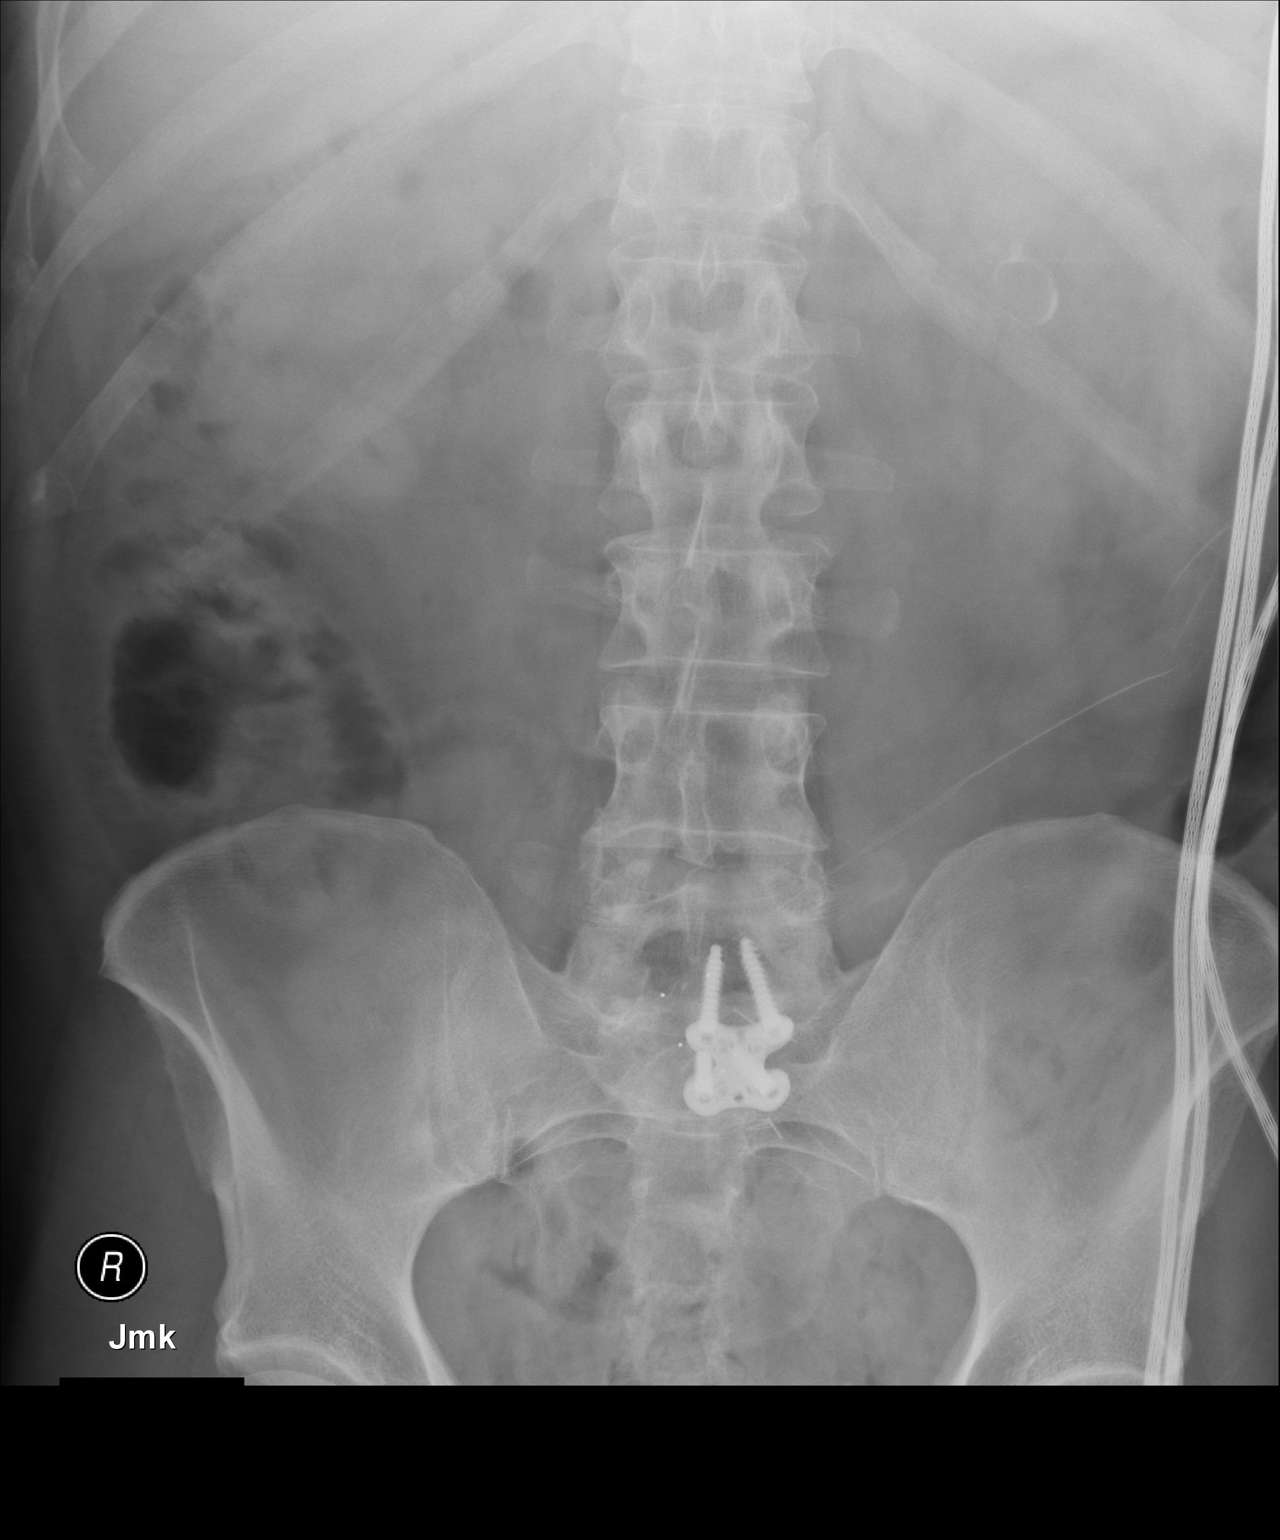

[2 of 2 positions shown; findings below may reference images not displayed]

FINDINGS: L5-S1 fusion hardware noted anteriorly.  No complicating
features are identified.  Stable left renal artery aneurysm is
noted.  The bowel gas pattern is unremarkable.  The soft tissue
shadows are maintained.
IMPRESSION: Postop L5-S1 fusion.  No complicating features.

## 2012-04-01 SURGERY — ANTERIOR LUMBAR FUSION 1 LEVEL
Anesthesia: General | Site: Abdomen | Wound class: Clean

## 2012-04-01 MED ORDER — HYDROMORPHONE HCL PF 1 MG/ML IJ SOLN
0.5000 mg | INTRAMUSCULAR | Status: DC | PRN
  Administered 2012-04-02 (×3): 1 mg via INTRAVENOUS
  Filled 2012-04-01 (×3): qty 1

## 2012-04-01 MED ORDER — PHENOL 1.4 % MT LIQD: 1.0000 | OROMUCOSAL | Status: DC | PRN

## 2012-04-01 MED ORDER — THROMBIN 20000 UNITS EX SOLR
CUTANEOUS | Status: AC
  Filled 2012-04-01: qty 20000

## 2012-04-01 MED ORDER — VANCOMYCIN HCL IN DEXTROSE 1-5 GM/200ML-% IV SOLN
1000.0000 mg | Freq: Two times a day (BID) | INTRAVENOUS | Status: AC
  Administered 2012-04-01 – 2012-04-02 (×2): 1000 mg via INTRAVENOUS
  Filled 2012-04-01 (×2): qty 200

## 2012-04-01 MED ORDER — POTASSIUM CHLORIDE IN NACL 20-0.9 MEQ/L-% IV SOLN
INTRAVENOUS | Status: AC
  Administered 2012-04-01: 22:00:00 via INTRAVENOUS
  Filled 2012-04-01 (×2): qty 1000

## 2012-04-01 MED ORDER — SODIUM CHLORIDE 0.9 % IJ SOLN: 3.0000 mL | INTRAMUSCULAR | Status: DC | PRN

## 2012-04-01 MED ORDER — ENOXAPARIN SODIUM 40 MG/0.4ML ~~LOC~~ SOLN
40.0000 mg | SUBCUTANEOUS | Status: DC
  Administered 2012-04-02 – 2012-04-03 (×2): 40 mg via SUBCUTANEOUS
  Filled 2012-04-01 (×3): qty 0.4

## 2012-04-01 MED ORDER — METHOCARBAMOL 500 MG PO TABS
500.0000 mg | ORAL_TABLET | Freq: Four times a day (QID) | ORAL | Status: DC | PRN
  Administered 2012-04-01 – 2012-04-02 (×3): 500 mg via ORAL
  Filled 2012-04-01 (×3): qty 1

## 2012-04-01 MED ORDER — METOPROLOL TARTRATE 25 MG PO TABS
25.0000 mg | ORAL_TABLET | Freq: Two times a day (BID) | ORAL | Status: DC
  Administered 2012-04-01 – 2012-04-03 (×4): 25 mg via ORAL
  Filled 2012-04-01 (×6): qty 1

## 2012-04-01 MED ORDER — ACETAMINOPHEN 650 MG RE SUPP: 650.0000 mg | RECTAL | Status: DC | PRN

## 2012-04-01 MED ORDER — GLYCOPYRROLATE 0.2 MG/ML IJ SOLN
INTRAMUSCULAR | Status: DC | PRN
  Administered 2012-04-01: 0.6 mg via INTRAVENOUS

## 2012-04-01 MED ORDER — VECURONIUM BROMIDE 10 MG IV SOLR
INTRAVENOUS | Status: DC | PRN
  Administered 2012-04-01 (×2): 1 mg via INTRAVENOUS
  Administered 2012-04-01: 2 mg via INTRAVENOUS
  Administered 2012-04-01: 1 mg via INTRAVENOUS

## 2012-04-01 MED ORDER — SODIUM CHLORIDE 0.9 % IV SOLN: 250.0000 mL | INTRAVENOUS | Status: DC

## 2012-04-01 MED ORDER — ACETAMINOPHEN 325 MG PO TABS: 650.0000 mg | ORAL_TABLET | ORAL | Status: DC | PRN

## 2012-04-01 MED ORDER — METHOCARBAMOL 100 MG/ML IJ SOLN
500.0000 mg | Freq: Four times a day (QID) | INTRAMUSCULAR | Status: DC | PRN
  Filled 2012-04-01: qty 5

## 2012-04-01 MED ORDER — TRAMADOL HCL 50 MG PO TABS: 50.0000 mg | ORAL_TABLET | Freq: Three times a day (TID) | ORAL | Status: DC | PRN

## 2012-04-01 MED ORDER — ONDANSETRON HCL 4 MG/2ML IJ SOLN: 4.0000 mg | INTRAMUSCULAR | Status: DC | PRN

## 2012-04-01 MED ORDER — MIDAZOLAM HCL 5 MG/5ML IJ SOLN
INTRAMUSCULAR | Status: DC | PRN
  Administered 2012-04-01: 2 mg via INTRAVENOUS

## 2012-04-01 MED ORDER — LACTATED RINGERS IV SOLN
INTRAVENOUS | Status: DC | PRN
  Administered 2012-04-01 (×2): via INTRAVENOUS

## 2012-04-01 MED ORDER — 0.9 % SODIUM CHLORIDE (POUR BTL) OPTIME
TOPICAL | Status: DC | PRN
  Administered 2012-04-01: 1000 mL

## 2012-04-01 MED ORDER — FENTANYL CITRATE 0.05 MG/ML IJ SOLN
INTRAMUSCULAR | Status: DC | PRN
  Administered 2012-04-01: 100 ug via INTRAVENOUS
  Administered 2012-04-01 (×4): 50 ug via INTRAVENOUS

## 2012-04-01 MED ORDER — ARTIFICIAL TEARS OP OINT
TOPICAL_OINTMENT | OPHTHALMIC | Status: DC | PRN
  Administered 2012-04-01: 1 via OPHTHALMIC

## 2012-04-01 MED ORDER — SUCCINYLCHOLINE CHLORIDE 20 MG/ML IJ SOLN
INTRAMUSCULAR | Status: DC | PRN
  Administered 2012-04-01: 100 mg via INTRAVENOUS

## 2012-04-01 MED ORDER — DOCUSATE SODIUM 100 MG PO CAPS
100.0000 mg | ORAL_CAPSULE | Freq: Two times a day (BID) | ORAL | Status: DC
  Administered 2012-04-01 – 2012-04-03 (×5): 100 mg via ORAL
  Filled 2012-04-01 (×6): qty 1

## 2012-04-01 MED ORDER — OXYCODONE-ACETAMINOPHEN 5-325 MG PO TABS: 1.0000 | ORAL_TABLET | ORAL | Status: DC | PRN

## 2012-04-01 MED ORDER — THROMBIN 5000 UNITS EX SOLR
CUTANEOUS | Status: DC | PRN
  Administered 2012-04-01: 5000 [IU] via TOPICAL

## 2012-04-01 MED ORDER — ENOXAPARIN SODIUM 40 MG/0.4ML ~~LOC~~ SOLN: 40.0000 mg | SUBCUTANEOUS | Status: DC

## 2012-04-01 MED ORDER — ROCURONIUM BROMIDE 100 MG/10ML IV SOLN
INTRAVENOUS | Status: DC | PRN
  Administered 2012-04-01: 40 mg via INTRAVENOUS

## 2012-04-01 MED ORDER — OXYCODONE HCL 5 MG PO TABS: 5.0000 mg | ORAL_TABLET | Freq: Once | ORAL | Status: DC | PRN

## 2012-04-01 MED ORDER — CEFAZOLIN SODIUM-DEXTROSE 2-3 GM-% IV SOLR
2.0000 g | Freq: Three times a day (TID) | INTRAVENOUS | Status: DC
  Administered 2012-04-01 – 2012-04-02 (×4): 2 g via INTRAVENOUS
  Filled 2012-04-01 (×6): qty 50

## 2012-04-01 MED ORDER — PANTOPRAZOLE SODIUM 40 MG IV SOLR
40.0000 mg | Freq: Every day | INTRAVENOUS | Status: DC
  Administered 2012-04-01: 40 mg via INTRAVENOUS
  Filled 2012-04-01 (×2): qty 40

## 2012-04-01 MED ORDER — HYDROMORPHONE HCL PF 1 MG/ML IJ SOLN
0.2500 mg | INTRAMUSCULAR | Status: DC | PRN
  Administered 2012-04-01 (×3): 0.5 mg via INTRAVENOUS

## 2012-04-01 MED ORDER — GLIPIZIDE ER 10 MG PO TB24
10.0000 mg | ORAL_TABLET | Freq: Every day | ORAL | Status: DC
  Administered 2012-04-02 – 2012-04-03 (×2): 10 mg via ORAL
  Filled 2012-04-01 (×3): qty 1

## 2012-04-01 MED ORDER — INSULIN ASPART 100 UNIT/ML ~~LOC~~ SOLN
0.0000 [IU] | Freq: Three times a day (TID) | SUBCUTANEOUS | Status: DC
  Administered 2012-04-01: 3 [IU] via SUBCUTANEOUS
  Administered 2012-04-02: 2 [IU] via SUBCUTANEOUS
  Administered 2012-04-02: 3 [IU] via SUBCUTANEOUS
  Administered 2012-04-02: 2 [IU] via SUBCUTANEOUS

## 2012-04-01 MED ORDER — LACTATED RINGERS IV SOLN
INTRAVENOUS | Status: DC | PRN
  Administered 2012-04-01 (×2): via INTRAVENOUS

## 2012-04-01 MED ORDER — PHENYLEPHRINE HCL 10 MG/ML IJ SOLN
10.0000 mg | INTRAVENOUS | Status: DC | PRN
  Administered 2012-04-01: 10 ug/min via INTRAVENOUS

## 2012-04-01 MED ORDER — LACTATED RINGERS IV SOLN: INTRAVENOUS | Status: DC

## 2012-04-01 MED ORDER — PROPOFOL 10 MG/ML IV BOLUS
INTRAVENOUS | Status: DC | PRN
  Administered 2012-04-01: 160 mg via INTRAVENOUS
  Administered 2012-04-01: 40 mg via INTRAVENOUS

## 2012-04-01 MED ORDER — PHENYLEPHRINE HCL 10 MG/ML IJ SOLN
INTRAMUSCULAR | Status: DC | PRN
  Administered 2012-04-01 (×2): 40 ug via INTRAVENOUS

## 2012-04-01 MED ORDER — SODIUM CHLORIDE 0.9 % IJ SOLN: 3.0000 mL | Freq: Two times a day (BID) | INTRAMUSCULAR | Status: DC

## 2012-04-01 MED ORDER — LIDOCAINE HCL (CARDIAC) 20 MG/ML IV SOLN
INTRAVENOUS | Status: DC | PRN
  Administered 2012-04-01: 60 mg via INTRAVENOUS

## 2012-04-01 MED ORDER — SODIUM CHLORIDE 0.9 % IR SOLN
Status: DC | PRN
  Administered 2012-04-01: 10:00:00

## 2012-04-01 MED ORDER — MENTHOL 3 MG MT LOZG: 1.0000 | LOZENGE | OROMUCOSAL | Status: DC | PRN

## 2012-04-01 MED ORDER — EPHEDRINE SULFATE 50 MG/ML IJ SOLN
INTRAMUSCULAR | Status: DC | PRN
  Administered 2012-04-01 (×2): 10 mg via INTRAVENOUS

## 2012-04-01 MED ORDER — PROPOFOL INFUSION 10 MG/ML OPTIME
INTRAVENOUS | Status: DC | PRN
  Administered 2012-04-01: 25 ug/kg/min via INTRAVENOUS

## 2012-04-01 MED ORDER — METHOCARBAMOL 500 MG PO TABS: 500.0000 mg | ORAL_TABLET | Freq: Three times a day (TID) | ORAL | Status: DC | PRN

## 2012-04-01 MED ORDER — CEFAZOLIN SODIUM-DEXTROSE 2-3 GM-% IV SOLR
INTRAVENOUS | Status: AC
  Filled 2012-04-01: qty 50

## 2012-04-01 MED ORDER — DOCUSATE SODIUM 100 MG PO CAPS: 100.0000 mg | ORAL_CAPSULE | Freq: Two times a day (BID) | ORAL | Status: DC

## 2012-04-01 MED ORDER — VITAMIN C 500 MG PO TABS
500.0000 mg | ORAL_TABLET | Freq: Two times a day (BID) | ORAL | Status: DC
  Administered 2012-04-01 – 2012-04-03 (×5): 500 mg via ORAL
  Filled 2012-04-01 (×6): qty 1

## 2012-04-01 MED ORDER — HYDROMORPHONE HCL PF 1 MG/ML IJ SOLN
INTRAMUSCULAR | Status: AC
  Filled 2012-04-01: qty 1

## 2012-04-01 MED ORDER — CEFAZOLIN SODIUM-DEXTROSE 2-3 GM-% IV SOLR
2.0000 g | Freq: Three times a day (TID) | INTRAVENOUS | Status: DC
  Filled 2012-04-01 (×2): qty 50

## 2012-04-01 MED ORDER — NEOSTIGMINE METHYLSULFATE 1 MG/ML IJ SOLN
INTRAMUSCULAR | Status: DC | PRN
  Administered 2012-04-01: 4 mg via INTRAVENOUS

## 2012-04-01 MED ORDER — OXYCODONE-ACETAMINOPHEN 5-325 MG PO TABS
1.0000 | ORAL_TABLET | ORAL | Status: DC | PRN
  Administered 2012-04-01 (×2): 1 via ORAL
  Administered 2012-04-01 – 2012-04-03 (×7): 2 via ORAL
  Filled 2012-04-01 (×5): qty 2
  Filled 2012-04-01: qty 1
  Filled 2012-04-01: qty 2
  Filled 2012-04-01: qty 1
  Filled 2012-04-01 (×2): qty 2

## 2012-04-01 MED ORDER — ONDANSETRON HCL 4 MG/2ML IJ SOLN
INTRAMUSCULAR | Status: DC | PRN
  Administered 2012-04-01: 4 mg via INTRAVENOUS

## 2012-04-01 MED ORDER — THROMBIN 5000 UNITS EX SOLR
CUTANEOUS | Status: AC
  Filled 2012-04-01: qty 5000

## 2012-04-01 MED ORDER — PROMETHAZINE HCL 25 MG/ML IJ SOLN: 6.2500 mg | INTRAMUSCULAR | Status: DC | PRN

## 2012-04-01 MED ORDER — THROMBIN 5000 UNITS EX SOLR: CUTANEOUS | Status: DC | PRN

## 2012-04-01 MED ORDER — HEMOSTATIC AGENTS (NO CHARGE) OPTIME
TOPICAL | Status: DC | PRN
  Administered 2012-04-01: 1 via TOPICAL

## 2012-04-01 MED ORDER — OXYCODONE HCL 5 MG/5ML PO SOLN: 5.0000 mg | Freq: Once | ORAL | Status: DC | PRN

## 2012-04-01 SURGICAL SUPPLY — 98 items
APPLIER CLIP 11 MED OPEN (CLIP) ×3
BAG DECANTER FOR FLEXI CONT (MISCELLANEOUS) ×3 IMPLANT
BENZOIN TINCTURE PRP APPL 2/3 (GAUZE/BANDAGES/DRESSINGS) IMPLANT
BLADE SURG 10 STRL SS (BLADE) ×3 IMPLANT
BLADE SURG ROTATE 9660 (MISCELLANEOUS) ×3 IMPLANT
CAGE LUMBAR COUGAR LG 12 10 (Cage) ×3 IMPLANT
CHLORAPREP W/TINT 26ML (MISCELLANEOUS) ×3 IMPLANT
CLIP APPLIE 11 MED OPEN (CLIP) ×2 IMPLANT
CLOTH BEACON ORANGE TIMEOUT ST (SAFETY) ×3 IMPLANT
CORDS BIPOLAR (ELECTRODE) ×3 IMPLANT
COVER BACK TABLE 24X17X13 BIG (DRAPES) ×3 IMPLANT
COVER MAYO STAND STRL (DRAPES) ×9 IMPLANT
COVER SURGICAL LIGHT HANDLE (MISCELLANEOUS) ×3 IMPLANT
COVER TABLE BACK 60X90 (DRAPES) IMPLANT
CUBE CONFORM 17MM (Orthopedic Implant) ×9 IMPLANT
DERMABOND ADVANCED (GAUZE/BANDAGES/DRESSINGS)
DERMABOND ADVANCED .7 DNX12 (GAUZE/BANDAGES/DRESSINGS) IMPLANT
DRAPE C-ARM 42X72 X-RAY (DRAPES) ×6 IMPLANT
DRAPE INCISE IOBAN 66X45 STRL (DRAPES) ×3 IMPLANT
DRAPE SURG 17X23 STRL (DRAPES) ×12 IMPLANT
DRSG MEPILEX BORDER 4X8 (GAUZE/BANDAGES/DRESSINGS) ×3 IMPLANT
ELECT BLADE 4.0 EZ CLEAN MEGAD (MISCELLANEOUS) ×3
ELECT CAUTERY BLADE 6.4 (BLADE) ×3 IMPLANT
ELECT REM PT RETURN 9FT ADLT (ELECTROSURGICAL) ×3
ELECTRODE BLDE 4.0 EZ CLN MEGD (MISCELLANEOUS) ×2 IMPLANT
ELECTRODE REM PT RTRN 9FT ADLT (ELECTROSURGICAL) ×2 IMPLANT
GAUZE SPONGE 4X4 16PLY XRAY LF (GAUZE/BANDAGES/DRESSINGS) IMPLANT
GLOVE BIO SURGEON STRL SZ7 (GLOVE) ×3 IMPLANT
GLOVE BIO SURGEON STRL SZ7.5 (GLOVE) ×3 IMPLANT
GLOVE BIO SURGEON STRL SZ8.5 (GLOVE) IMPLANT
GLOVE BIOGEL PI IND STRL 6.5 (GLOVE) IMPLANT
GLOVE BIOGEL PI IND STRL 7.0 (GLOVE) ×2 IMPLANT
GLOVE BIOGEL PI IND STRL 8 (GLOVE) ×2 IMPLANT
GLOVE BIOGEL PI IND STRL 8.5 (GLOVE) IMPLANT
GLOVE BIOGEL PI INDICATOR 6.5 (GLOVE)
GLOVE BIOGEL PI INDICATOR 7.0 (GLOVE) ×1
GLOVE BIOGEL PI INDICATOR 8 (GLOVE) ×1
GLOVE BIOGEL PI INDICATOR 8.5 (GLOVE)
GLOVE ECLIPSE 6.5 STRL STRAW (GLOVE) IMPLANT
GLOVE ECLIPSE 7.5 STRL STRAW (GLOVE) IMPLANT
GLOVE SURG ORTHO 7.0 STRL STRW (GLOVE) ×3 IMPLANT
GLOVE SURG SS PI 7.5 STRL IVOR (GLOVE) ×9 IMPLANT
GLOVE SURG SS PI 8.0 STRL IVOR (GLOVE) ×3 IMPLANT
GOWN EXTRA PROTECTION XL (GOWNS) ×3 IMPLANT
GOWN PREVENTION PLUS XLARGE (GOWN DISPOSABLE) IMPLANT
GOWN STRL NON-REIN LRG LVL3 (GOWN DISPOSABLE) ×6 IMPLANT
HEMOSTAT SURGICEL 2X14 (HEMOSTASIS) IMPLANT
INSERT FOGARTY 61MM (MISCELLANEOUS) IMPLANT
INSERT FOGARTY SM (MISCELLANEOUS) IMPLANT
KIT BASIN OR (CUSTOM PROCEDURE TRAY) ×3 IMPLANT
KIT ROOM TURNOVER OR (KITS) ×3 IMPLANT
LOOP VESSEL MAXI BLUE (MISCELLANEOUS) ×3 IMPLANT
LOOP VESSEL MINI RED (MISCELLANEOUS) ×3 IMPLANT
MARKER SKIN DUAL TIP RULER LAB (MISCELLANEOUS) ×3 IMPLANT
NEEDLE ASP BONE MRW 11GX15 J (NEEDLE) ×3 IMPLANT
NEEDLE BONE MARROW 8GAX6 (NEEDLE) IMPLANT
NEEDLE SPNL 18GX3.5 QUINCKE PK (NEEDLE) ×3 IMPLANT
NS IRRIG 1000ML POUR BTL (IV SOLUTION) ×3 IMPLANT
PACK LAMINECTOMY ORTHO (CUSTOM PROCEDURE TRAY) ×3 IMPLANT
PAD ARMBOARD 7.5X6 YLW CONV (MISCELLANEOUS) ×6 IMPLANT
PIN FIXATION (PIN) ×6 IMPLANT
SCREW AEGIS 24MM (Screw) ×12 IMPLANT
SLEEVE SURGEON STRL (DRAPES) ×3 IMPLANT
SPONGE INTESTINAL PEANUT (DISPOSABLE) IMPLANT
SPONGE LAP 18X18 X RAY DECT (DISPOSABLE) IMPLANT
SPONGE LAP 4X18 X RAY DECT (DISPOSABLE) IMPLANT
SPONGE SURGIFOAM ABS GEL 100 (HEMOSTASIS) IMPLANT
STAPLER VISISTAT 35W (STAPLE) IMPLANT
STRIP CLOSURE SKIN 1/2X4 (GAUZE/BANDAGES/DRESSINGS) ×3 IMPLANT
SURGIFLO TRUKIT (HEMOSTASIS) IMPLANT
SUT MNCRL AB 3-0 PS2 18 (SUTURE) ×3 IMPLANT
SUT PROLENE 4 0 RB 1 (SUTURE)
SUT PROLENE 4-0 RB1 .5 CRCL 36 (SUTURE) IMPLANT
SUT PROLENE 5 0 CC1 (SUTURE) IMPLANT
SUT PROLENE 6 0 C 1 30 (SUTURE) IMPLANT
SUT PROLENE 6 0 CC (SUTURE) IMPLANT
SUT SILK 0 TIES 10X30 (SUTURE) ×3 IMPLANT
SUT SILK 2 0 TIES 10X30 (SUTURE) ×3 IMPLANT
SUT SILK 2 0SH CR/8 30 (SUTURE) IMPLANT
SUT SILK 3 0 TIES 10X30 (SUTURE) ×3 IMPLANT
SUT SILK 3 0SH CR/8 30 (SUTURE) IMPLANT
SUT VIC AB 0 CT1 27 (SUTURE)
SUT VIC AB 0 CT1 27XBRD ANBCTR (SUTURE) IMPLANT
SUT VIC AB 1 CTX 36 (SUTURE) ×3
SUT VIC AB 1 CTX36XBRD ANBCTR (SUTURE) ×6 IMPLANT
SUT VIC AB 2-0 CT1 27 (SUTURE) ×2
SUT VIC AB 2-0 CT1 TAPERPNT 27 (SUTURE) ×4 IMPLANT
SUT VIC AB 2-0 CTB1 (SUTURE) IMPLANT
SUT VIC AB 3-0 SH 27 (SUTURE)
SUT VIC AB 3-0 SH 27X BRD (SUTURE) IMPLANT
SUT VIC AB 3-0 X1 27 (SUTURE) ×3 IMPLANT
SYR 30ML SLIP (SYRINGE) ×3 IMPLANT
SYR BULB IRRIGATION 50ML (SYRINGE) ×3 IMPLANT
TOWEL OR 17X24 6PK STRL BLUE (TOWEL DISPOSABLE) ×3 IMPLANT
TOWEL OR 17X26 10 PK STRL BLUE (TOWEL DISPOSABLE) ×3 IMPLANT
TRAY FOLEY CATH 14FRSI W/METER (CATHETERS) ×3 IMPLANT
WATER STERILE IRR 1000ML POUR (IV SOLUTION) IMPLANT
aegis lumbar plate 19mm (Orthopedic Implant) ×3 IMPLANT

## 2012-04-01 NOTE — Op Note (Signed)
NAMEMarland Kitchen  Darrell [name], Darrell [name] NO.:  [id number]  MEDICAL RECORD NO.:  [id number]  LOCATION:  5N02C                        FACILITY:  [facility]  PHYSICIAN:  Jene Every, M.D.    DATE OF BIRTH:  1950/04/03  DATE OF PROCEDURE:  04/01/2012 DATE OF DISCHARGE:                              OPERATIVE REPORT   PREOPERATIVE DIAGNOSIS:  Disabling discogenic mechanical back pain, L5- S1, refractory.  POSTOPERATIVE DIAGNOSIS:  Disabling discogenic mechanical back pain, L5- S1, refractory.  PROCEDURE PERFORMED: 1. Anterior lumbar interbody fusion at L5-S1 with full diskectomy. 2. Placement of DePuy stand-alone interbody Cougar cage. 3. Anterior lumbar plating utilizing 4 cortical screws. 4. Autologous and allograft bone graft.  ANESTHESIA:  General.  ASSISTANT:  Lanna Poche, PA with exposure by Dr. Waverly Ferrari.  COMPONENTS UTILIZED: 1. DePuy Cougar cage large 12 mm 10-degree. 2. Aegis lumbar plate 90 mm with four 24 mm Aegis screws. 3. MTF ConForM CuBE.  BRIEF HISTORY:  A 62 year old with end-stage disk degeneration refractory to conservative treatment disabling and affecting his activities of daily living and work.  He was indicated for anterior lumbar interbody fusion.  He underwent preoperative clearance by his cardiologist, medical physician, and Cardiovascular Surgery.  Surgeon, Waverly Ferrari performed the approach.  The patient has a history of MRSA infection, therefore had IV vancomycin preoperatively.  We discussed risks and benefits of the anterior approach including bleeding, infection, damage to neurovascular structure, DVT, PE, anesthetic complications, etc.  Also nonunion and no history of lumbar decompression.  TECHNIQUE:  With the patient in a supine position on the [place] table, all bony prominences were well padded.  The abdominal region was prepped and draped in usual sterile fashion after C-arm augmentation indicated of appropriate  level of the abdominal incision performed by Dr. Edilia Bo.  The incision was made and the self-retaining retractors placed and excellent exposure by Dr. Edilia Bo to the anterior space at L5-S1.  The center point of the disk space at L5-S1 was confirmed in the AP and lateral plane and marked with a marker with protection of the iliac vessels.  We used a Jamshidi to perform a bone marrow biopsy of the vertebral body of 5, impacting it 2 cm and withdrawing 12 mL of bone marrow aspirate.  Following removal of the device, it was sealed with bone wax.  This was then utilized with the ConForM CuBE to be inserted into the interbody cage.  Following this, with a small and a large templated device, we marked the medial and lateral good parameter of the diskectomy.  The patient had large anterior osteophytic spurs of L5 and also of S1, these were removed with Leksell rongeur.  #10 blade was utilized to incise the anterior longitudinal ligament and the annulus. A full diskectomy was performed with a combination of pituitaries, straight curettes.  The cartilaginous portion of the endplate remaining was removed carefully with an angled curette.  We then sequentially dilated and distracted the disk space utilizing serial distractors in alternating sides for serial gentle distraction.  We gauged the posterior aspect of the diskectomy on a lateral C-arm and detaching the posterior annulus with a micro curette both off the S1 vertebral  body and the L5 vertebral body.  Distraction in the AP and lateral plane was parallel.  There was no divergence associated with this and we achieved good distraction with a 10 and then better with a 12 mm insert.  This was then checked in the AP and lateral plane and marked.  Next, we tried the 10 cage, but found the optimal fit was a 12.  We then used the Cougar cage packed, with a ConForM CuBE that was saturated with bone marrow aspirate within the cage itself.  After  copious irrigation of the disk space and noting good bleeding endplates, we inserted the cage appropriately in the AP and lateral plane centralizing.  He had slight rotation at the bottom of the vertebral body and slight divergence of the endplates.  This was then inserted in the appropriate fashion near the posterior aspect of the vertebral body in the AP and lateral plane satisfactory.  The inserter was then removed.  Anterior bone spurs were removed.  A 19 mm Aegis plate was then placed anteriorly over the point of the anterior portion of the device in the AP and lateral plane bisecting the pedicles.  A provisional pin was placed and then four 24 mm screws were placed with excellent purchase after appropriate awl was utilized confirming the angulation.  Excellent purchase was noted. These screws were then torque locked.  The device prior to that was stable.  I then used a separate ConForM CuBE sections to pack on either side of the device.  Wound was copiously irrigated.  Inspection revealed no leakage prior to insertion of the device either bone or CSF.  In the AP and lateral plane, excellent placement of the device.  After copious irrigation, the retractors were sequentially removed.  There was no active bleeding or evidence of urine.  Good pulsatile vessels were noted.  After copious irrigation, then we obtained final radiographs and then closed over the abdominal fascia protecting posteriorly the Deaver with #1 Vicryl running suture and interrupted suture 2 separate starting from outside in.  Subcu with 2-0 Vicryl.  Skin was reapproximated with 4- 0 subcuticular suture.  Wound reinforced with Steri-Strips.  Sterile dressing applied.  Confirmatory radiograph at the end noted no foreign bodies.  The patient had intraoperative neural monitoring throughout the case and there were no abnormalities noted.  Good pulse oximetry into the left lower extremity was noted as well.  The  patient was then extubated without difficulty, and transported to the recovery room in satisfactory condition.  The patient tolerated the procedure well.  No complications.  Assistant, Lanna Poche, [place].  Blood loss 100 mL.     Jene Every, M.D.     Cordelia Pen  D:  04/01/2012  T:  04/01/2012  Job:  [id number]

## 2012-04-01 NOTE — Preoperative (Signed)
Beta Blockers   Reason not to administer Beta Blockers:Not Applicable 

## 2012-04-01 NOTE — Anesthesia Procedure Notes (Signed)
Procedure Name: Intubation Date/Time: 04/01/2012 7:32 AM Performed by: Fransisca Kaufmann Pre-anesthesia Checklist: Patient identified, Emergency Drugs available, Suction available, Patient being monitored and Timeout performed Patient Re-evaluated:Patient Re-evaluated prior to inductionOxygen Delivery Method: Circle system utilized Preoxygenation: Pre-oxygenation with 100% oxygen Intubation Type: IV induction Ventilation: Mask ventilation without difficulty Laryngoscope Size: Mac and 4 Grade View: Grade I Tube type: Oral Number of attempts: 1 Airway Equipment and Method: Stylet Placement Confirmation: ETT inserted through vocal cords under direct vision,  positive ETCO2 and breath sounds checked- equal and bilateral Secured at: 23 cm Tube secured with: Tape Dental Injury: Teeth and Oropharynx as per pre-operative assessment

## 2012-04-01 NOTE — Clinical Social Work Note (Signed)
Clinical Social Work Department BRIEF PSYCHOSOCIAL ASSESSMENT 04/01/2012  Patient:  Darrell [name], Darrell [name]     Account Number:  [id number]     Admit date:  04/01/2012  Clinical Social Worker:  Johnsie Cancel  Date/Time:  04/01/2012 02:02 PM  Referred by:  Physician  Date Referred:  04/01/2012 Referred for  SNF Placement   Other Referral:   Interview type:  Patient Other interview type:    PSYCHOSOCIAL DATA Living Status:  WIFE Primary support name:  Darrell [name] [phone]) Primary support relationship to patient:  SPOUSE Degree of support available:   Adequate.    CURRENT CONCERNS Current Concerns  Post-Acute Placement   Other Concerns:    SOCIAL WORK ASSESSMENT / PLAN CSW consulted by MD re: SNF placement. CSW provided support to patient on hospital stay. CSW provided education on the SNF process. Patient requested he be faxed out to [facility] for SNF. CSW informed patient she would, and patient was agreeable. CSW contacted the patient's Worker's Comp Hassan Buckler, Glenetta Borg. at [phone] to begin authorization. CSW will continue to follow patient.   Assessment/plan status:  Information/Referral to [facility] Other assessment/ plan:   Information/referral to community resources:  SNF List  PATIENT'S/FAMILY'S RESPONSE TO PLAN OF CARE: Patient thanked CSW for starting SNF process, and providing support.    Lia Foyer, LCSWA [facility] Clinical Social Worker Contact #: [phone]

## 2012-04-01 NOTE — Transfer of Care (Signed)
Immediate Anesthesia Transfer of Care Note  Patient: Darrell [name]  Procedure(s) Performed: Procedure(s) (LRB) with comments: ANTERIOR LUMBAR FUSION 1 LEVEL (N/A) - ALIF L5-S1 ABDOMINAL EXPOSURE (N/A)  Patient Location: PACU  Anesthesia Type:General  Level of Consciousness: awake, alert , oriented and sedated  Airway & Oxygen Therapy: Patient Spontanous Breathing and Patient connected to nasal cannula oxygen  Post-op Assessment: Report given to PACU RN, Post -op Vital signs reviewed and stable and Patient moving all extremities  Post vital signs: Reviewed and stable  Complications: No apparent anesthesia complications

## 2012-04-01 NOTE — Progress Notes (Signed)
Utilization review completed. Thierno Hun, RN, BSN. 

## 2012-04-01 NOTE — Clinical Social Work Placement (Signed)
Clinical Social Work Department CLINICAL SOCIAL WORK PLACEMENT NOTE 04/01/2012  Patient:  KIPTON, SKILLEN  Account Number:  [id number] Admit date:  04/01/2012  Clinical Social Worker:  Johnsie Cancel  Date/time:  04/01/2012 02:08 PM  Clinical Social Work is seeking post-discharge placement for this patient at the following level of care:   SKILLED NURSING   (*CSW will update this form in Epic as items are completed)   04/01/2012  Patient/family provided with [facility] Department of Clinical Social Work's list of facilities offering this level of care within the geographic area requested by the patient (or if unable, by the patient's family).  04/01/2012  Patient/family informed of their freedom to choose among providers that offer the needed level of care, that participate in Medicare, Medicaid or managed care program needed by the patient, have an available bed and are willing to accept the patient.  04/01/2012  Patient/family informed of [facility]' ownership interest in [facility], as well as of the fact that they are under no obligation to receive care at this facility.  PASARR submitted to EDS on 04/01/2012 PASARR number received from EDS on 04/01/2012  FL2 transmitted to all facilities in geographic area requested by pt/family on  04/01/2012 FL2 transmitted to all facilities within larger geographic area on   Patient informed that his/her managed care company has contracts with or will negotiate with  certain facilities, including the following:     Patient/family informed of bed offers received:   Patient chooses bed at  Physician recommends and patient chooses bed at    Patient to be transferred to  on   Patient to be transferred to facility by   The following physician request were entered in Epic:   Additional Comments:  Lia Foyer, LCSWA [facility] Clinical Social Worker Contact #: [phone]

## 2012-04-01 NOTE — Progress Notes (Signed)
VASCULAR PROGRESS NOTE  SUBJECTIVE: Comfortable  PHYSICAL EXAM: Filed Vitals:   04/01/12 1230 04/01/12 1231 04/01/12 1232 04/01/12 1300  BP: 133/67   136/62  Pulse: 62 81 76 71  Temp: 97.4 F (36.3 C)   97.6 F (36.4 C)  TempSrc:      Resp: 11 12 8 16   SpO2: 100% 100% 100% 99%   Palpable left DP pulse.  LABS: Lab Results  Component Value Date   WBC 12.6* 04/01/2012   HGB 13.6 04/01/2012   HCT 39.1 04/01/2012   MCV 94.2 04/01/2012   PLT 124* 04/01/2012  CBG (last 3)   Basename 04/01/12 0617  GLUCAP 141*    Principal Problem:  *DDD (degenerative disc disease), lumbar  ASSESSMENT AND PLAN: 1. Doing well post op.  Cari Caraway Beeper: [phone] 04/01/2012
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
## 2012-04-01 NOTE — Brief Op Note (Signed)
04/01/2012  11:33 AM  PATIENT:  Darrell [name]  62 y.o. male  PRE-OPERATIVE DIAGNOSIS:  DJD L5-S1  POST-OPERATIVE DIAGNOSIS:  DJD L5-S1  PROCEDURE:  Procedure(s) (LRB) with comments: ANTERIOR LUMBAR FUSION 1 LEVEL (N/A) - ALIF L5-S1 ABDOMINAL EXPOSURE (N/A)  SURGEON:  Surgeon(s) and Role: Panel 1:    * Javier Docker, MD - Primary  Panel 2:    * Chuck Hint, MD - Primary  PHYSICIAN ASSISTANT:   ASSISTANTS: Daphene Calamity   ANESTHESIA:   general  EBL:  Total I/O In: 3400 [I.V.:3400] Out: 700 [Urine:600; Blood:100]  BLOOD ADMINISTERED:none  DRAINS: none   LOCAL MEDICATIONS USED:  MARCAINE    and NONE  SPECIMEN:  No Specimen  DISPOSITION OF SPECIMEN:  N/A  COUNTS:  YES  TOURNIQUET:  * No tourniquets in log *  DICTATION: .Other Dictation: Dictation Number [id number]  PLAN OF CARE: Admit to inpatient   PATIENT DISPOSITION:  PACU - hemodynamically stable.   Delay start of Pharmacological VTE agent (>24hrs) due to surgical blood loss or risk of bleeding: yes

## 2012-04-01 NOTE — H&P (Signed)
Darrell [name] is an 62 y.o. male.   Chief Complaint: LBP and leg pain HPI: Long history of LBP treated conservatively. Progressive pain precluding ADL's and Work Her for ALIF  Past Medical History  Diagnosis Date  . Hyperlipidemia   . Diverticulosis     slight -per colonoscopy, pt. asymptomatic- thus far  . GERD (gastroesophageal reflux disease)   . Insomnia   . Aortic stenosis 05/2011    Severe by recent echo; AoV area ~0.77-79 cm2, peak /mean gradient 86 mmHg/ 55 mmHg.  Marland Kitchen History of BPH   . Hypertension     [facility]- Dr. Herbie [place]   . Myocardial infarction   . Recurrent upper respiratory infection (URI)     cold- curently- "per pt., on the mend"  . Arthritis     back- low- [facility]- workmen's comp. situation   . Depression     quick temper- per pt.   . Hiatal hernia   . Prostate disease   . History of MRSA infection   . DDD (degenerative disc disease), lumbosacral   . NIDDM (non-insulin dependent diabetes mellitus)     Type 2 NIDDM x 5 years    Past Surgical History  Procedure Date  . Cardiac catheterization     [facility]- 06/2011  . Nasal septum surgery     [facility] ago  . Aortic valve replacement 08/05/2011    Procedure: AORTIC VALVE REPLACEMENT (AVR);  Surgeon: [name], MD;  Location: [facility] OR;  Service: Open Heart Surgery;  Laterality: N/A;    Family History  Problem Relation Age of Onset  . Heart disease Mother   . Heart attack Brother   . Anesthesia problems Neg Hx    Social History:  reports that he has never smoked. He has never used smokeless tobacco. He reports that he does not drink alcohol or use illicit drugs.  Allergies:  Allergies  Allergen Reactions  . Tape Rash    Medications Prior to Admission  Medication Sig Dispense Refill  . acetaminophen (TYLENOL) 500 MG tablet Take 1,000 mg by mouth every 6 (six) hours as needed. For pain      . aspirin 325 MG EC tablet Take 325 mg by mouth daily.      Marland Kitchen CINNAMON PO Take 2  capsules by mouth daily.      Marland Kitchen GARLIC PO Take 2 tablets by mouth 2 (two) times daily.       Marland Kitchen glipiZIDE (GLUCOTROL XL) 10 MG 24 hr tablet Take 10 mg by mouth daily.      Marland Kitchen losartan (COZAAR) 50 MG tablet Take 50 mg by mouth daily.      Marland Kitchen MAGNESIUM-ZINC PO Take 3 tablets by mouth as needed.       . metFORMIN (GLUCOPHAGE) 1000 MG tablet Take 1,000 mg by mouth 2 (two) times daily with a meal.      . metoprolol tartrate (LOPRESSOR) 25 MG tablet Take 25 mg by mouth 2 (two) times daily.      . Multiple Vitamin (MULITIVITAMIN WITH MINERALS) TABS Take 1 tablet by mouth daily.      . rosuvastatin (CRESTOR) 20 MG tablet Take 20 mg by mouth at bedtime.       . traMADol (ULTRAM) 50 MG tablet Take 50 mg by mouth every 8 (eight) hours as needed. For pain      . vitamin C (ASCORBIC ACID) 500 MG tablet Take 500 mg by mouth 2 (two) times daily.  Results for orders placed during the hospital encounter of 04/01/12 (from the past 48 hour(s))  GLUCOSE, CAPILLARY     Status: Abnormal   Collection Time   04/01/12  6:17 AM      Component Value Range Comment   Glucose-Capillary 141 (*) 70 - 99 mg/dL    No results found.  Review of Systems  Musculoskeletal: Positive for back pain.  All other systems reviewed and are negative.    Blood pressure 141/72, pulse 75, temperature 98 F (36.7 C), temperature source Oral, resp. rate 18, SpO2 98.00%. Physical Exam  Constitutional: He is oriented to person, place, and time. He appears well-developed.  HENT:  Head: Normocephalic.  Eyes: Pupils are equal, round, and reactive to light.  Neck: Normal range of motion.  Cardiovascular: Normal rate.   Respiratory: Effort normal.  GI: Soft.  Genitourinary: Penis normal.  Musculoskeletal:       Severe LBP. Global limited ROM. SLR LBP  Neurological: He is alert and oriented to person, place, and time.  Skin: Skin is warm and dry.  Psychiatric: He has a normal mood and affect.   MRI DDD severe L5S1. foramenal  stenosis. No DVT Xray severe DDD L5 S1  Assessment/Plan Severe Mechanical LBP and radicular pain due to progressive DDD L5S1. Hx of MRSA.  Plan ALIF L5S1. Risks discussed.Also benefits.  Alysen Smylie C 04/01/2012, 6:56 AM

## 2012-04-01 NOTE — Anesthesia Postprocedure Evaluation (Signed)
Anesthesia Post Note  Patient: Darrell [name]  Procedure(s) Performed: Procedure(s) (LRB): ANTERIOR LUMBAR FUSION 1 LEVEL (N/A) ABDOMINAL EXPOSURE (N/A)  Anesthesia type: general  Patient location: PACU  Post pain: Pain level controlled  Post assessment: Patient's Cardiovascular Status Stable  Last Vitals:  Filed Vitals:   04/01/12 1232  BP:   Pulse: 76  Temp:   Resp: 8    Post vital signs: Reviewed and stable  Level of consciousness: sedated  Complications: No apparent anesthesia complications

## 2012-04-01 NOTE — Op Note (Signed)
NAME: [name]   MRN: [id number] DOB: 09/26/1950    DATE OF OPERATION: 04/01/2012  PREOP DIAGNOSIS: degenerative disc disease L5-S1  POSTOP DIAGNOSIS: same  PROCEDURE: Anterior retroperitoneal exposure of L5-S1  SURGEON: Di Kindle. Edilia Bo, MD, FACS  ASSIST: Paula Libra, MD  ANESTHESIA: Gen.   EBL: minimal  INDICATIONS: Darrell [name] is a 62 y.o. male with significant degenerative disc disease at L5-S1. He was felt to be a good candidate for ALIF and I was asked to provide anterior retroperitoneal exposure.  FINDINGS: significant degenerative disc disease at L5-S1.  TECHNIQUE: The patient was brought to the operating room and sedated by anesthesia. Monitoring lines had been placed. He received a general anesthetic. The level of the L5-S1 disc was marked under fluoroscopy. The abdomen was prepped and draped in the usual sterile fashion. A transverse incision was made on the left over the area that was marked. Dissection was carried down to the anterior rectus sheath. The anterior rectus sheath was divided transversely. The fascia was mobilized away from the rectus abdominis muscle both superiorly and inferiorly. The rectus abdominous muscle was retracted to the patient's right allowing exposure of the retroperitoneum. The retroperitoneal plane was identified and using blunt dissection the dissection carried medial to the psoas muscle to the common and external iliac artery on the left. This allowed exposure of the sacral promontory. The middle sacral vessels were divided between clips and were cauterized. This allowed blunt exposure of the L5-S1 disc such that a reversed lip retractor could be placed on the right and left of the disc space. Once adequate exposure had been obtained the level was confirmed under fluoroscopy. The remainder of the dictation is as dictated by Dr. Shelle Iron.   Waverly Ferrari, MD, FACS [facility]  DATE OF DICTATION:    04/01/2012

## 2012-04-01 NOTE — Interval H&P Note (Signed)
History and Physical Interval Note:  04/01/2012 7:18 AM  Darrell [name]  has presented today for surgery, with the diagnosis of DJD L5-S1  The various methods of treatment have been discussed with the patient and family. After consideration of risks, benefits and other options for treatment, the patient has consented to  Procedure(s) (LRB) with comments: ANTERIOR LUMBAR FUSION 1 LEVEL (N/A) - ALIF L5-S1 ABDOMINAL EXPOSURE (N/A) as a surgical intervention .  The patient's history has been reviewed, patient examined, no change in status, stable for surgery.  I have reviewed the patient's chart and labs.  Questions were answered to the patient's satisfaction.     DICKSON,CHRISTOPHER S

## 2012-04-01 NOTE — Anesthesia Preprocedure Evaluation (Addendum)
Anesthesia Evaluation  Patient identified by MRN, date of birth, ID band Patient awake    Reviewed: Allergy & Precautions, H&P , NPO status , Patient's Chart, lab work & pertinent test results  Airway Mallampati: I TM Distance: >3 FB Neck ROM: Full    Dental  (+) Dental Advisory Given, Edentulous Upper and Edentulous Lower   Pulmonary neg pulmonary ROS,    Pulmonary exam normal       Cardiovascular hypertension, Pt. on home beta blockers + Past MI  S/p AVR,  NL EF   Neuro/Psych PSYCHIATRIC DISORDERS Depression    GI/Hepatic Neg liver ROS, hiatal hernia, GERD-  Medicated,  Endo/Other  diabetes, Type 2, Oral Hypoglycemic Agents  Renal/GU      Musculoskeletal   Abdominal   Peds  Hematology negative hematology ROS (+)   Anesthesia Other Findings   Reproductive/Obstetrics                         Anesthesia Physical Anesthesia Plan  ASA: III  Anesthesia Plan: General   Post-op Pain Management:    Induction: Intravenous  Airway Management Planned: Oral ETT  Additional Equipment:   Intra-op Plan:   Post-operative Plan: Extubation in OR  Informed Consent: I have reviewed the patients History and Physical, chart, labs and discussed the procedure including the risks, benefits and alternatives for the proposed anesthesia with the patient or authorized representative who has indicated his/her understanding and acceptance.   Dental advisory given  Plan Discussed with: CRNA, Anesthesiologist and Surgeon  Anesthesia Plan Comments:         Anesthesia Quick Evaluation

## 2012-04-02 ENCOUNTER — Inpatient Hospital Stay (HOSPITAL_COMMUNITY): Payer: Worker's Compensation

## 2012-04-02 ENCOUNTER — Encounter (HOSPITAL_COMMUNITY): Payer: Self-pay | Admitting: General Practice

## 2012-04-02 DIAGNOSIS — Z09 Encounter for follow-up examination after completed treatment for conditions other than malignant neoplasm: Secondary | ICD-10-CM

## 2012-04-02 HISTORY — PX: LUMBAR FUSION: SHX111

## 2012-04-02 LAB — BASIC METABOLIC PANEL
CO2: 28 mEq/L (ref 19–32)
Calcium: 8.6 mg/dL (ref 8.4–10.5)
Creatinine, Ser: 0.75 mg/dL (ref 0.50–1.35)
GFR calc Af Amer: >90 mL/min (ref >90)
GFR calc non Af Amer: >90 mL/min (ref >90)
Sodium: 142 mEq/L (ref 135–145)

## 2012-04-02 LAB — CBC
MCH: 32.3 pg (ref 26.0–34.0)
MCHC: 34.6 g/dL (ref 30.0–36.0)
MCV: 93.2 fL (ref 78.0–100.0)
Platelets: 111 10*3/uL — ABNORMAL LOW (ref 150–400)
RBC: 3.84 MIL/uL — ABNORMAL LOW (ref 4.22–5.81)
RDW: 12.9 % (ref 11.5–15.5)

## 2012-04-02 LAB — GLUCOSE, CAPILLARY: Glucose-Capillary: 83 mg/dL (ref 70–99)

## 2012-04-02 IMAGING — CR DG LUMBAR SPINE 2-3V
3 series · 3 of 3 positions shown · non-contrast
Comparison: Intraoperative lumbar spine x-rays yesterday.

CLINICAL DATA: L5-S1 anterior fusion yesterday.

LUMBAR SPINE - 2-3 VIEW

[t l-spine a.p.]
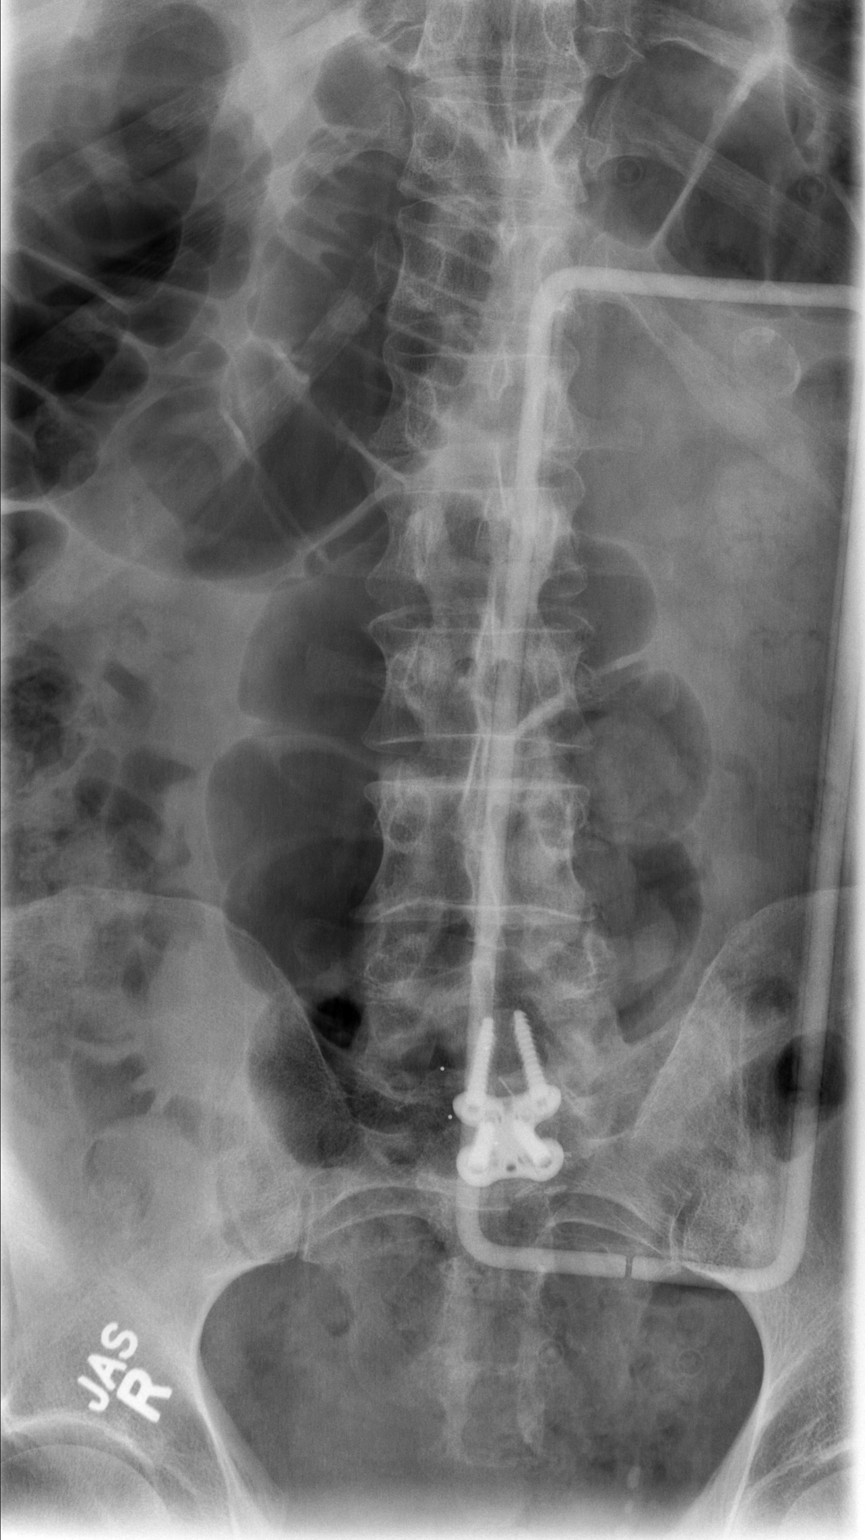

[t l-spine lat]
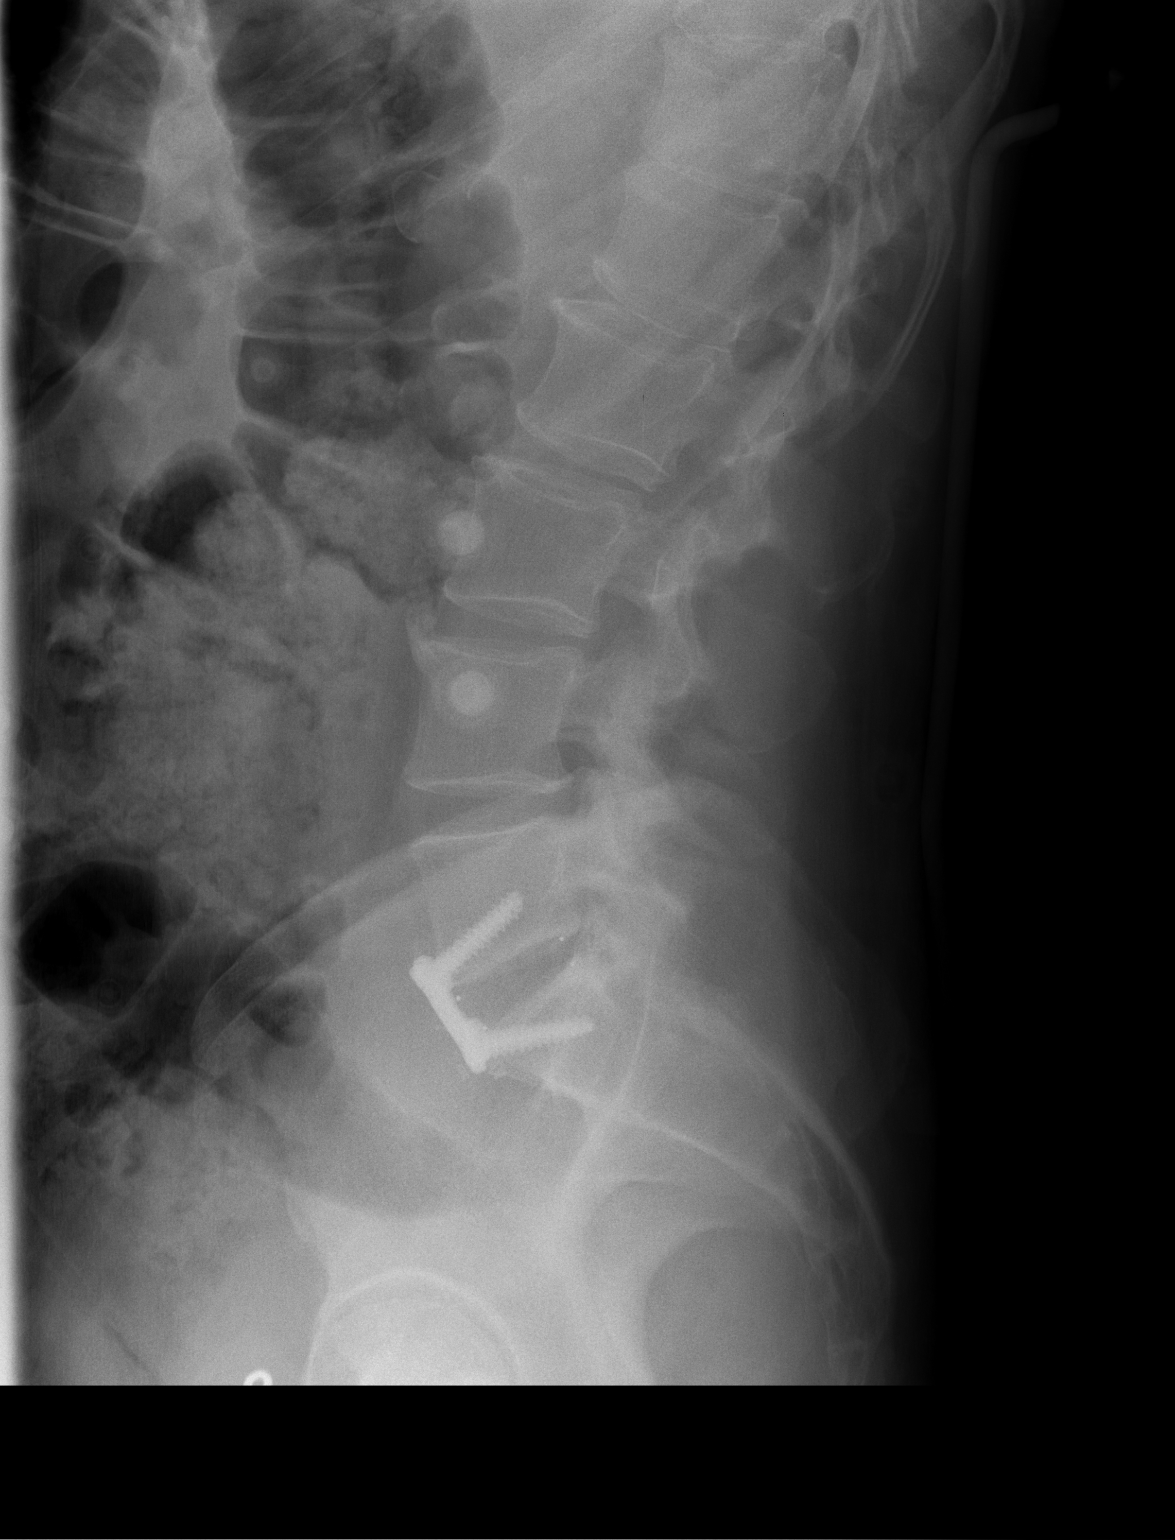

[t l-spine l5-s1 spot]
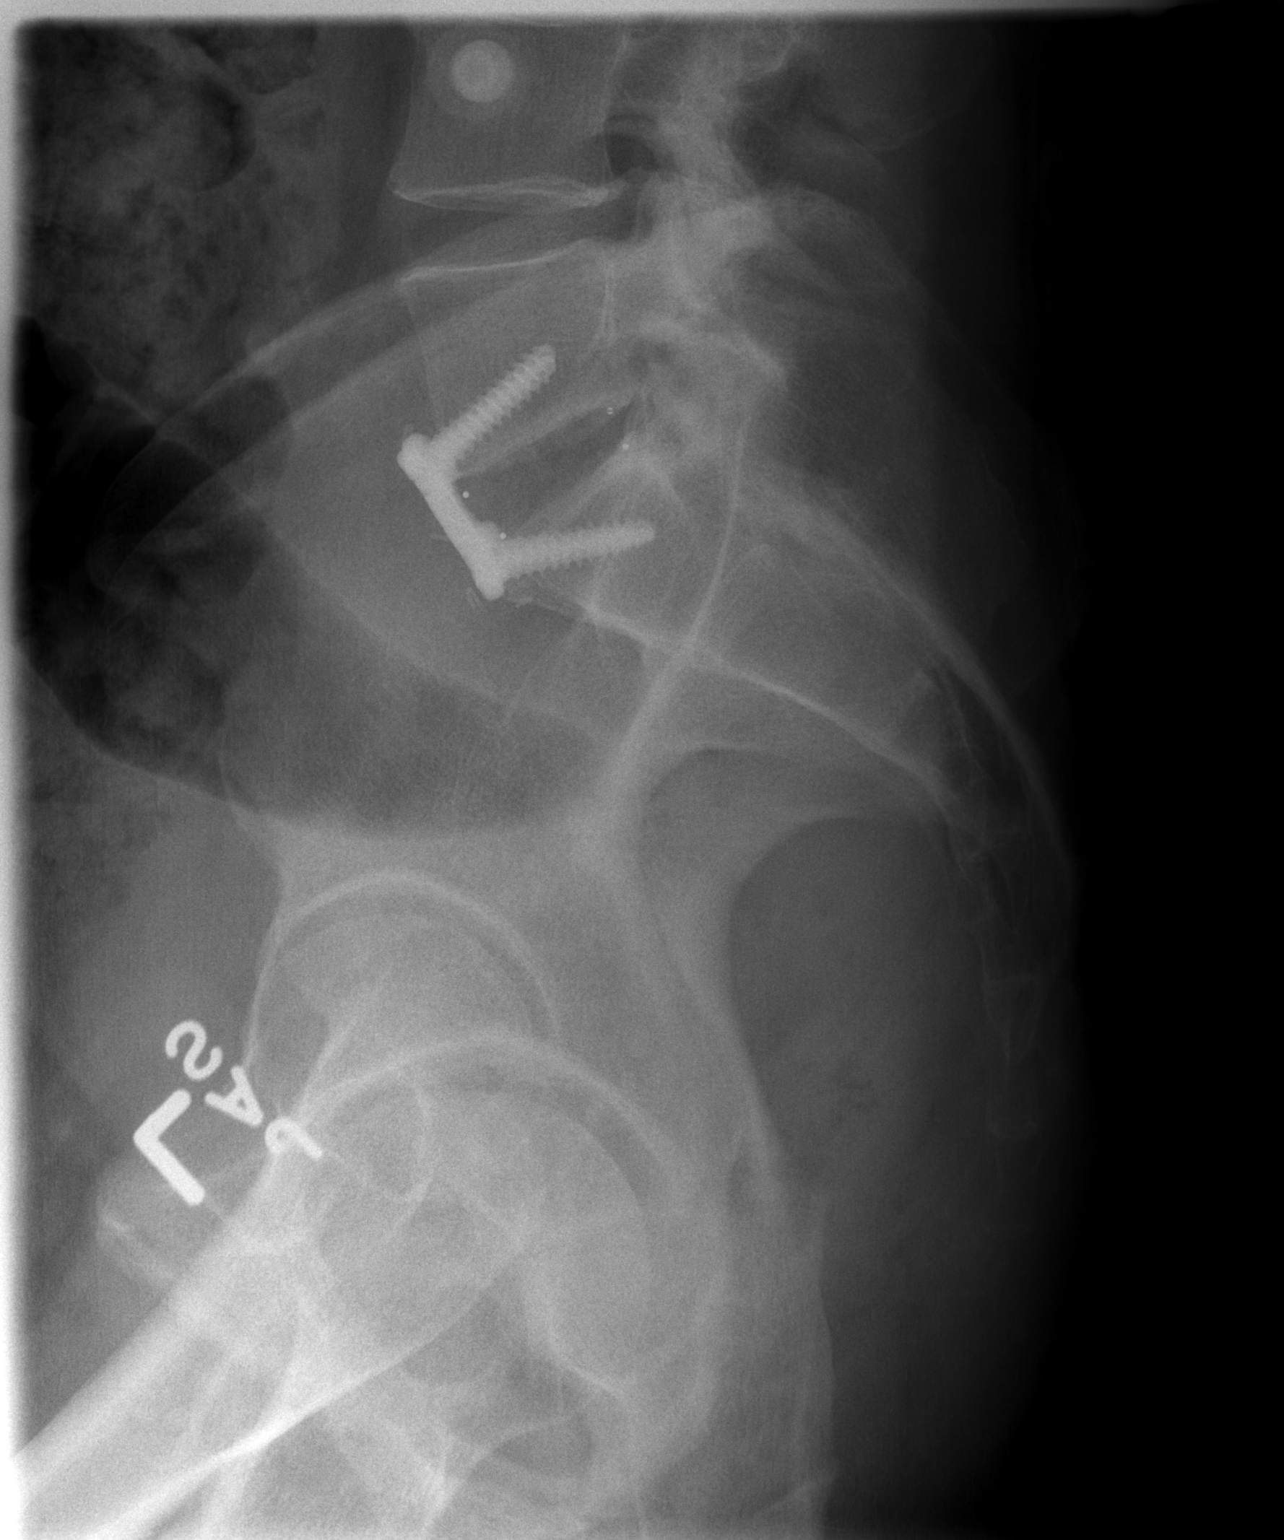

[3 of 3 positions shown; findings below may reference images not displayed]

FINDINGS: Anterior fusion with hardware at L5-S1 with interbody
fusion plugs which are appropriately positioned within the disc
space.  Hardware intact.  No complicating features.  Mild
spondylosis involving the remainder of the lumbar spine with well-
preserved disc spaces.
IMPRESSION: No complicating features post anterior L5-S1 fusion.

## 2012-04-02 MED ORDER — PANTOPRAZOLE SODIUM 40 MG PO TBEC
40.0000 mg | DELAYED_RELEASE_TABLET | Freq: Every day | ORAL | Status: DC
  Administered 2012-04-02: 40 mg via ORAL
  Filled 2012-04-02: qty 1

## 2012-04-02 NOTE — Evaluation (Signed)
Physical Therapy Evaluation Patient Details Name: Darrell [name] MRN: [id number] DOB: December 22, 1950 Today's Date: 04/02/2012 Time: 7829-5621 PT Time Calculation (min): 27 min  PT Assessment / Plan / Recommendation Clinical Impression  Pt is a 62 y/o male s/p anterior fusion of L5-S1.  Pt doing well with mobility and should be safe for d/c to home.  No DME needs identified by PT.    PT Assessment  Patent does not need any further PT services    Follow Up Recommendations  No PT follow up    Does the patient have the potential to tolerate intense rehabilitation      Barriers to Discharge        Equipment Recommendations  None recommended by PT    Recommendations for Other Services     Frequency      Precautions / Restrictions Precautions Precautions: Back Restrictions Weight Bearing Restrictions: No   Pertinent Vitals/Pain Pt reporting 3/10 pain in low back. Pt medicated prior to session.  Pt able to don back brace with minimal cueing.        Mobility  Bed Mobility Bed Mobility: Rolling Right;Right Sidelying to Sit Rolling Right: 5: Supervision Right Sidelying to Sit: 5: Supervision;HOB flat Details for Bed Mobility Assistance: verbal and tactile cues for log roll technique.  Transfers Transfers: Sit to Stand;Stand to Sit Sit to Stand: 7: Independent;From bed;With upper extremity assist Stand to Sit: 7: Independent;To chair/3-in-1;With armrests Ambulation/Gait Ambulation/Gait Assistance: 5: Supervision Ambulation Distance (Feet): 150 Feet Assistive device: None Ambulation/Gait Assistance Details: Verbal cues to relax shoulders and trunk musculature. Gait Pattern: Decreased stride length Stairs: Yes Stairs Assistance: 7: Independent Stair Management Technique: Step to pattern;One rail Left;Forwards Number of Stairs: 5  Wheelchair Mobility Wheelchair Mobility: No    Exercises     PT Diagnosis:    PT Problem List:   PT Treatment Interventions:     PT Goals     Visit Information  Last PT Received On: 04/02/12    Subjective Data      Prior Functioning  Home Living Lives With: Spouse;Family Available Help at Discharge: Family;Available 24 hours/day Type of Home: House Home Access: Stairs to enter [employer] of Steps: 4 Entrance Stairs-Rails: Left Home Layout: One level Bathroom Shower/Tub: Forensic scientist: Standard Bathroom Accessibility: Yes How Accessible: Accessible via walker Home Adaptive Equipment: None Prior Function Level of Independence: Independent Able to Take Stairs?: Yes Driving: Yes Vocation: 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 difficulties    Copywriter, advertising Overall Cognitive Status: Appears within functional limits for tasks assessed/performed Arousal/Alertness: Awake/alert Orientation Level: Appears intact for tasks assessed Behavior During Session: [facility] for tasks performed    Extremity/Trunk Assessment Right Upper Extremity Assessment RUE ROM/Strength/Tone: [facility] for tasks assessed Left Upper Extremity Assessment LUE ROM/Strength/Tone: WFL for tasks assessed Right Lower Extremity Assessment RLE ROM/Strength/Tone: WFL for tasks assessed RLE Sensation: WFL - Proprioception;WFL - Light Touch Left Lower Extremity Assessment LLE ROM/Strength/Tone: WFL for tasks assessed LLE Sensation: WFL - Proprioception;WFL - Light Touch   Balance Balance Balance Assessed: No  End of Session PT - End of Session Equipment Utilized During Treatment: Gait belt;Back brace Activity Tolerance: Patient tolerated treatment well Patient left: in chair;with call bell/phone within reach Nurse Communication: Mobility status  GP     Faust Thorington 04/02/2012, 1:41 PM  Talea Manges L. Aldric Wenzler DPT [phone]

## 2012-04-02 NOTE — Progress Notes (Signed)
Subjective: 1 Day Post-Op Procedure(s) (LRB): ANTERIOR LUMBAR FUSION 1 LEVEL (N/A) ABDOMINAL EXPOSURE (N/A) Patient reports pain as 4 on 0-10 scale.    Objective: Vital signs in last 24 hours: Temp:  [97.4 F (36.3 C)-99.6 F (37.6 C)] 99.6 F (37.6 C) (02/07 0200) Pulse Rate:  [62-96] 90  (02/07 0200) Resp:  [8-21] 18  (02/07 0200) BP: (128-152)/(61-86) 128/64 mmHg (02/07 0200) SpO2:  [92 %-100 %] 93 % (02/07 0200)  Intake/Output from previous day: 02/06 0701 - 02/07 0700 In: 4000 [I.V.:4000] Out: 4700 [Urine:4600; Blood:100] Intake/Output this shift: Total I/O In: -  Out: 2000 [Urine:2000]   Basename 04/01/12 1339  HGB 13.6    Basename 04/01/12 1339  WBC 12.6*  RBC 4.15*  HCT 39.1  PLT 124*    Basename 04/01/12 1339  NA --  K --  CL --  CO2 --  BUN --  CREATININE 0.72  GLUCOSE --  CALCIUM --   No results found for this basename: LABPT:2,INR:2 in the last 72 hours  Neurologically intact Sensation intact distally Intact pulses distally Dorsiflexion/Plantar flexion intact Incision: dressing C/D/I  Assessment/Plan: 1 Day Post-Op Procedure(s) (LRB): ANTERIOR LUMBAR FUSION 1 LEVEL (N/A) ABDOMINAL EXPOSURE (N/A) Advance diet Up with therapy D/C IV fluids Brace from [employer]. OHF trapeze Check H/H, Bmet. XRAY. Doppler.  Darrell [name] C 04/02/2012, 5:59 AM

## 2012-04-02 NOTE — Progress Notes (Signed)
Order for post operative Ancef clarified with Dr. Shelle Iron.  Ancef to be d/c'd post 2/07 0600 dose this am.

## 2012-04-02 NOTE — Progress Notes (Signed)
Orthopedic Tech Progress Note Patient Details:  MELVEN STOCKARD 1950-10-15 [id number]  Patient ID: Gregary Signs, male   DOB: 29-Nov-1950, 62 y.o.   MRN: [id number] brace order completed by Storm Frisk, Trashawn Oquendo 04/02/2012, 6:00 PM

## 2012-04-02 NOTE — Progress Notes (Signed)
Orthopedic Tech Progress Note Patient Details:  Darrell [name] 11-22-1950 [id number]  Ortho Devices Ortho Device/Splint Interventions: Ordered LSO brace   Haskell Flirt 04/02/2012, 9:03 AM

## 2012-04-02 NOTE — Clinical Social Work Note (Signed)
CSW signing off, patient will go home with PT/OT. RNCM is following patient. CSW signing off, no other psychosocial concerns identified. Please re-consult as needed.  Lia Foyer, LCSWA [facility] Clinical Social Worker Contact #: [phone]

## 2012-04-02 NOTE — Progress Notes (Signed)
*  PRELIMINARY RESULTS* Vascular Ultrasound Lower extremity venous duplexd has been completed.  Preliminary findings: Bilateral:  No evidence of DVT, superficial thrombosis, or Baker's Cyst.    Farrel Demark, RDMS, RVT 04/02/2012, 9:29 AM

## 2012-04-02 NOTE — Progress Notes (Signed)
VASCULAR PROGRESS NOTE  SUBJECTIVE: Some moderate discomfort last PM. No nausea.  PHYSICAL EXAM: Filed Vitals:   04/01/12 2111 04/01/12 2215 04/02/12 0200 04/02/12 0600  BP: 144/61 136/64 128/64 134/58  Pulse: 94 96 90 88  Temp: 99.2 F (37.3 C)  99.6 F (37.6 C) 99 F (37.2 C)  TempSrc:      Resp: 18  18 18   SpO2: 92%  93% 96%   Palpable left DP pulse Dsg dry  CBG (last 3)   Basename 04/02/12 0639 04/01/12 2130 04/01/12 1636  GLUCAP 155* 129* 154*    Principal Problem:  *DDD (degenerative disc disease), lumbar  ASSESSMENT AND PLAN: 1. 1 Day Post-Op s/p: Ant RP exposure of L5-S1. Doing well. 2. Will be available as needed.   Cari Caraway Beeper: [phone] 04/02/2012

## 2012-04-03 LAB — BASIC METABOLIC PANEL
CO2: 28 mEq/L (ref 19–32)
Calcium: 8.8 mg/dL (ref 8.4–10.5)
Chloride: 101 mEq/L (ref 96–112)
Creatinine, Ser: 0.76 mg/dL (ref 0.50–1.35)
Glucose, Bld: 132 mg/dL — ABNORMAL HIGH (ref 70–99)

## 2012-04-03 LAB — CBC
Hemoglobin: 11.7 g/dL — ABNORMAL LOW (ref 13.0–17.0)
MCH: 31.5 pg (ref 26.0–34.0)
MCV: 95.2 fL (ref 78.0–100.0)
Platelets: 98 10*3/uL — ABNORMAL LOW (ref 150–400)
RBC: 3.72 MIL/uL — ABNORMAL LOW (ref 4.22–5.81)
WBC: 9.5 10*3/uL (ref 4.0–10.5)

## 2012-04-03 NOTE — Progress Notes (Signed)
D/C instructions reviewed with patient and wife. RX x 4 given. No hh services or equipment needed. lovenox teaching completed and understanding/ comfort with procedure verbalized. All questions answered. Pt d/c'ed via wheelchair in stable condition

## 2012-04-03 NOTE — Progress Notes (Signed)
Subjective: 2 Days Post-Op Procedure(s) (LRB): ALIF L5-S1 (N/A) ABDOMINAL EXPOSURE (N/A) Patient reports pain as mild.    Objective: Vital signs in last 24 hours: Temp:  [97.6 F (36.4 C)-99.4 F (37.4 C)] 97.6 F (36.4 C) (02/08 0540) Pulse Rate:  [76-91] 76 (02/08 0540) Resp:  [18-20] 18 (02/08 0540) BP: (138-145)/(59-69) 145/62 mmHg (02/08 0540) SpO2:  [97 %-100 %] 99 % (02/08 0540)  Intake/Output from previous day:   Intake/Output this shift:     Recent Labs  04/01/12 1339 04/02/12 0643 04/03/12 0600  HGB 13.6 12.4* 11.7*    Recent Labs  04/02/12 0643 04/03/12 0600  WBC 10.3 9.5  RBC 3.84* 3.72*  HCT 35.8* 35.4*  PLT 111* 98*    Recent Labs  04/02/12 0643 04/03/12 0600  NA 142 138  K 3.9 3.5  CL 104 101  CO2 28 28  BUN 10 12  CREATININE 0.75 0.76  GLUCOSE 167* 132*  CALCIUM 8.6 8.8   No results found for this basename: LABPT, INR,  in the last 72 hours  Neurologically intact ABD soft Neurovascular intact Incision: dressing C/D/I No cellulitis present  Assessment/Plan: 2 Days Post-Op Procedure(s) (LRB): ALIF L5-S1 (N/A) ABDOMINAL EXPOSURE (N/A) Up with therapy D/C IV fluids Discharge home with home health  Darrell [name] V 04/03/2012, 9:13 AM

## 2012-04-03 NOTE — Evaluation (Signed)
Occupational Therapy Evaluation and Discharge Patient Details Name: Darrell [name] MRN: [id number] DOB: Jun 27, 1950 Today's Date: 04/03/2012 Time: 1014-1030 OT Time Calculation (min): 16 min  OT Assessment / Plan / Recommendation Clinical Impression  This 62 yo s/p back surgery presents to acute OT with problems below. All education completed, acute OT will sign off.    OT Assessment  Patient does not need any further OT services    Follow Up Recommendations  No OT follow up       Equipment Recommendations   (sock aid, reacher, long handled shoe horn, long handled spon)          Precautions / Restrictions Precautions Precautions: Back Required Braces or Orthoses: Spinal Brace Spinal Brace: Lumbar corset;Applied in sitting position   Pertinent Vitals/Pain 8/10, pt reports it is not time for any more pain meds     ADL  Eating/Feeding: Simulated;Independent Grooming: Simulated;Independent Where Assessed - Grooming: Unsupported standing Upper Body Bathing: Simulated;Independent Where Assessed - Upper Body Bathing: Unsupported standing Lower Body Bathing: Simulated;Minimal assistance Where Assessed - Lower Body Bathing: Unsupported sit to stand Upper Body Dressing: Simulated;Independent Where Assessed - Upper Body Dressing: Unsupported sitting Lower Body Dressing: Simulated;Moderate assistance Where Assessed - Lower Body Dressing: Unsupported sit to stand Toilet Transfer: Min guard;Simulated Toilet Transfer Method: Sit to Barista: Other (comment) (from built in shower seat) Toileting - Clothing Manipulation and Hygiene: Simulated;Modified independent Where Assessed - Toileting Clothing Manipulation and Hygiene: Standing Tub/Shower Transfer: Performed;Min guard Tub/Shower Transfer Method: Ambulating Equipment Used: Back brace;Sock aid Transfers/Ambulation Related to ADLs: Mod I for all ADL Comments: Cannot cross legs to get to feet, showed him sock  aid and let him practice        Visit Information  Last OT Received On: 04/03/12 Assistance Needed: +1    Subjective Data  Subjective: I want to be able to plant a large garden this year   Prior Functioning     Home Living Lives With: Spouse Available Help at Discharge: Family;Available 24 hours/day Type of Home: House Bathroom Shower/Tub: Tub/shower unit;Curtain Charity fundraiser with back Prior Function Level of Independence: Independent Communication Communication: No difficulties Dominant Hand: Right            Cognition  Cognition Overall Cognitive Status: Appears within functional limits for tasks assessed/performed Arousal/Alertness: Awake/alert Orientation Level: Appears intact for tasks assessed Behavior During Session: [facility] for tasks performed    Extremity/Trunk Assessment Right Upper Extremity Assessment RUE ROM/Strength/Tone: Within functional levels Left Upper Extremity Assessment LUE ROM/Strength/Tone: Within functional levels     Mobility Transfers Transfers: Sit to Stand;Stand to Sit Sit to Stand: 6: Modified independent (Device/Increase time);With armrests;From chair/3-in-1 Stand to Sit: 6: Modified independent (Device/Increase time);With upper extremity assist;To chair/3-in-1;With armrests           End of Session OT - End of Session Equipment Utilized During Treatment: Back brace (sock aid) Activity Tolerance: Patient tolerated treatment well Patient left: in chair;with family/visitor present       Evette Georges [phone]  04/03/2012, 12:49 PM

## 2012-04-05 MED FILL — Heparin Sodium (Porcine) Inj 1000 Unit/ML: INTRAMUSCULAR | Qty: 30 | Status: AC

## 2012-04-05 MED FILL — Sodium Chloride IV Soln 0.9%: INTRAVENOUS | Qty: 1000 | Status: AC

## 2012-04-05 MED FILL — Sodium Chloride Irrigation Soln 0.9%: Qty: 3000 | Status: AC

## 2012-04-06 ENCOUNTER — Encounter (HOSPITAL_COMMUNITY): Payer: Self-pay | Admitting: Specialist

## 2012-04-07 NOTE — Discharge Summary (Signed)
Physician Discharge Summary   Patient ID: Darrell [name] MRN: [id number] DOB/AGE: 1950-03-06 62 y.o.  Admit date: 04/01/2012 Discharge date: 04/07/2012  Primary Diagnosis:   DJD L5-S1  Admission Diagnoses:  Past Medical History  Diagnosis Date  . Hyperlipidemia   . Diverticulosis     slight -per colonoscopy, pt. asymptomatic- thus far  . GERD (gastroesophageal reflux disease)   . Insomnia   . Aortic stenosis 05/2011    Severe by recent echo; AoV area ~0.77-79 cm2, peak /mean gradient 86 mmHg/ 55 mmHg.  Marland Kitchen History of BPH   . Hypertension     [facility]- Dr. Herbie Baltimore   . Myocardial infarction   . Recurrent upper respiratory infection (URI)     cold- curently- "per pt., on the mend"  . Arthritis     back- low- [facility]- workmen's comp. situation   . Depression     quick temper- per pt.   . Hiatal hernia   . Prostate disease   . History of MRSA infection   . DDD (degenerative disc disease), lumbosacral   . NIDDM (non-insulin dependent diabetes mellitus)     Type 2 NIDDM x 5 years   Discharge Diagnoses:   Principal Problem:   DDD (degenerative disc disease), lumbar  Procedure:  Procedure(s) (LRB): ANTERIOR LUMBAR FUSION 1 LEVEL (N/A) ABDOMINAL EXPOSURE (N/A)   Consults: None  HPI:  See H&P    Laboratory Data: Hospital Outpatient Visit on 03/24/2012  Component Date Value Range Status  . WBC 03/24/2012 9.2  4.0 - 10.5 K/uL Final  . RBC 03/24/2012 4.52  4.22 - 5.81 MIL/uL Final  . Hemoglobin 03/24/2012 14.6  13.0 - 17.0 g/dL Final  . HCT 29/56/2130 42.2  39.0 - 52.0 % Final  . MCV 03/24/2012 93.4  78.0 - 100.0 fL Final  . MCH 03/24/2012 32.3  26.0 - 34.0 pg Final  . MCHC 03/24/2012 34.6  30.0 - 36.0 g/dL Final  . RDW 86/57/8469 12.7  11.5 - 15.5 % Final  . Platelets 03/24/2012 129* 150 - 400 K/uL Final  . Sodium 03/24/2012 139  135 - 145 mEq/L Final  . Potassium 03/24/2012 4.7  3.5 - 5.1 mEq/L Final  . Chloride 03/24/2012 102  96 - 112 mEq/L Final  . CO2  03/24/2012 25  19 - 32 mEq/L Final  . Glucose, Bld 03/24/2012 77  70 - 99 mg/dL Final  . BUN 62/95/2841 21  6 - 23 mg/dL Final  . Creatinine, Ser 03/24/2012 0.79  0.50 - 1.35 mg/dL Final  . Calcium 32/44/0102 9.4  8.4 - 10.5 mg/dL Final  . Total Protein 03/24/2012 7.0  6.0 - 8.3 g/dL Final  . Albumin 72/53/6644 4.3  3.5 - 5.2 g/dL Final  . AST 03/47/4259 22  0 - 37 U/L Final  . ALT 03/24/2012 23  0 - 53 U/L Final  . Alkaline Phosphatase 03/24/2012 41  39 - 117 U/L Final  . Total Bilirubin 03/24/2012 0.3  0.3 - 1.2 mg/dL Final  . GFR calc non Af Amer 03/24/2012 >90  >90 mL/min Final  . GFR calc Af Amer 03/24/2012 >90  >90 mL/min Final   Comment:                                 The eGFR has been calculated  using the CKD EPI equation.                          This calculation has not been                          validated in all clinical                          situations.                          eGFR's persistently                          <90 mL/min signify                          possible Chronic Kidney Disease.  Marland Kitchen Prothrombin Time 03/24/2012 12.9  11.6 - 15.2 seconds Final  . INR 03/24/2012 0.98  0.00 - 1.49 Final  . MRSA, PCR 03/24/2012 NEGATIVE  NEGATIVE Final  . Staphylococcus aureus 03/24/2012 NEGATIVE  NEGATIVE Final   Comment:                                 The Xpert SA Assay (FDA                          approved for NASAL specimens                          in patients over 65 years of age),                          is one component of                          a comprehensive surveillance                          program.  Test performance has                          been validated by [employer] for patients greater                          than or equal to 73 year old.                          It is not intended                          to diagnose infection nor to                          guide or monitor  treatment.  . ABO/RH(D) 03/24/2012 A POS   Final  . Antibody Screen 03/24/2012 NEG   Final  .  Sample Expiration 03/24/2012 04/07/2012   Final   No results found for this basename: HGB,  in the last 72 hours No results found for this basename: WBC, RBC, HCT, PLT,  in the last 72 hours No results found for this basename: NA, K, CL, CO2, BUN, CREATININE, GLUCOSE, CALCIUM,  in the last 72 hours No results found for this basename: LABPT, INR,  in the last 72 hours  X-Rays:Dg Lumbar Spine 2-3 Views  04/02/2012  *RADIOLOGY REPORT*  Clinical Data: L5-S1 anterior fusion yesterday.  LUMBAR SPINE - 2-3 VIEW  Comparison: Intraoperative lumbar spine x-rays yesterday.  Findings: Anterior fusion with hardware at L5-S1 with interbody fusion plugs which are appropriately positioned within the disc space.  Hardware intact.  No complicating features.  Mild spondylosis involving the remainder of the lumbar spine with well- preserved disc spaces.  IMPRESSION: No complicating features post anterior L5-S1 fusion.   Original Report Authenticated By: Hulan Saas, M.D.    Dg Lumbar Spine 2-3 Views  04/01/2012  *RADIOLOGY REPORT*  Clinical Data: Anterior L5-S1 fusion.  DG C-ARM 61-120 MIN, LUMBAR SPINE - 2-3 VIEW  Technique: Two fluoroscopic spot views of the lower lumbar spine provided.  Comparison:  Plain films lumbar spine 03/24/2012.  Findings: Images demonstrate placement anterior plate and screws and interbody spacer at L5-S1.  No acute finding is identified.  IMPRESSION: Anterior L5-S1 fusion.   Original Report Authenticated By: Holley Dexter, M.D.    Dg Lumbar Spine 2-3 Views  03/24/2012  *RADIOLOGY REPORT*  Clinical Data: Degenerative disc disease in the lumbar spine.  LUMBAR SPINE - 2-3 VIEW  Comparison: None  Findings: There is marked narrowing of the L5-S1 disc space. Slight facet arthritis at L4-5 on the right.  Slight retrolisthesis of L2 on L3 and of L3 on L4.  Vascular calcifications in the splenic  artery.  IMPRESSION: Degenerative disc disease at L5 S1.   Original Report Authenticated By: Francene Boyers, M.D.    Dg C-arm [id number] Min  04/01/2012  *RADIOLOGY REPORT*  Clinical Data: Anterior L5-S1 fusion.  DG C-ARM 61-120 MIN, LUMBAR SPINE - 2-3 VIEW  Technique: Two fluoroscopic spot views of the lower lumbar spine provided.  Comparison:  Plain films lumbar spine 03/24/2012.  Findings: Images demonstrate placement anterior plate and screws and interbody spacer at L5-S1.  No acute finding is identified.  IMPRESSION: Anterior L5-S1 fusion.   Original Report Authenticated By: Holley Dexter, M.D.    Dg Or Local Abdomen  04/01/2012  *RADIOLOGY REPORT*  Clinical Data: Anterior fusion L5-S1.  OR LOCAL ABDOMEN  Comparison: None.  Findings: L5-S1 fusion hardware noted anteriorly.  No complicating features are identified.  Stable left renal artery aneurysm is noted.  The bowel gas pattern is unremarkable.  The soft tissue shadows are maintained.  IMPRESSION: Postop L5-S1 fusion.  No complicating features.   Original Report Authenticated By: Rudie Meyer, M.D.     EKG: Orders placed during the hospital encounter of 08/05/11  . EKG 12-LEAD  . EKG 12-LEAD  . EKG 12-LEAD  . EKG 12-LEAD  . EKG     Hospital Course: Patient was admitted to [facility] and taken to the OR and underwent the above state procedure without complications.  Patient tolerated the procedure well and was later transferred to the recovery room and then to the orthopaedic floor for postoperative care.  They were given PO and IV analgesics for pain control following their surgery.  They were given 24 hours of postoperative antibiotics.  PT was consulted postop to assist with mobility and transfers.  The patient was allowed to be WBAT with therapy and was taught back precautions. Discharge planning was consulted to help with postop disposition and equipment needs.  Patient had a good night on the evening of surgery and started to  get up OOB with therapy on day one. Patient was seen in rounds and was ready to go home on day one.  They were given discharge instructions and dressing directions.  They were instructed on when to follow up in the office with Dr. Shelle Iron.  Discharge Medications: Prior to Admission medications   Medication Sig Start Date End Date Taking? Authorizing Provider  acetaminophen (TYLENOL) 500 MG tablet Take 1,000 mg by mouth every 6 (six) hours as needed. For pain   Yes Historical Provider, MD  glipiZIDE (GLUCOTROL XL) 10 MG 24 hr tablet Take 10 mg by mouth daily.   Yes Historical Provider, MD  losartan (COZAAR) 50 MG tablet Take 50 mg by mouth daily.   Yes Historical Provider, MD  metFORMIN (GLUCOPHAGE) 1000 MG tablet Take 1,000 mg by mouth 2 (two) times daily with a meal.   Yes Historical Provider, MD  metoprolol tartrate (LOPRESSOR) 25 MG tablet Take 25 mg by mouth 2 (two) times daily. 08/11/11 08/10/12 Yes Donielle Margaretann Loveless, PA  Multiple Vitamin (MULITIVITAMIN WITH MINERALS) TABS Take 1 tablet by mouth daily.   Yes Historical Provider, MD  rosuvastatin (CRESTOR) 20 MG tablet Take 20 mg by mouth at bedtime.    Yes Historical Provider, MD  traMADol (ULTRAM) 50 MG tablet Take 50 mg by mouth every 8 (eight) hours as needed. For pain   Yes Historical Provider, MD  vitamin C (ASCORBIC ACID) 500 MG tablet Take 500 mg by mouth 2 (two) times daily.    Yes Historical Provider, MD  docusate sodium (COLACE) 100 MG capsule Take 1 capsule (100 mg total) by mouth 2 (two) times daily. 04/01/12   Dayna Barker. Bissell, PA-C  enoxaparin (LOVENOX) 40 MG/0.4ML injection Inject 0.4 mLs (40 mg total) into the skin daily. 04/01/12   Javier Docker, MD  methocarbamol (ROBAXIN) 500 MG tablet Take 1 tablet (500 mg total) by mouth 3 (three) times daily between meals as needed. 04/01/12   Javier Docker, MD  oxyCODONE-acetaminophen (PERCOCET) 5-325 MG per tablet Take 1-2 tablets by mouth every 4 (four) hours as needed for pain. 04/01/12    Javier Docker, MD    Diet: Regular diet Activity:WBAT Follow-up:in 10 days Disposition - Home Discharged Condition: good      Medication List    STOP taking these medications       aspirin 325 MG EC tablet     CINNAMON PO     GARLIC PO     MAGNESIUM-ZINC PO      TAKE these medications       acetaminophen 500 MG tablet  Commonly known as:  TYLENOL  Take 1,000 mg by mouth every 6 (six) hours as needed. For pain     docusate sodium 100 MG capsule  Commonly known as:  COLACE  Take 1 capsule (100 mg total) by mouth 2 (two) times daily.     enoxaparin 40 MG/0.4ML injection  Commonly known as:  LOVENOX  Inject 0.4 mLs (40 mg total) into the skin daily.     glipiZIDE 10 MG 24 hr tablet  Commonly known as:  GLUCOTROL XL  Take 10 mg by mouth daily.     losartan  50 MG tablet  Commonly known as:  COZAAR  Take 50 mg by mouth daily.     metFORMIN 1000 MG tablet  Commonly known as:  GLUCOPHAGE  Take 1,000 mg by mouth 2 (two) times daily with a meal.     methocarbamol 500 MG tablet  Commonly known as:  ROBAXIN  Take 1 tablet (500 mg total) by mouth 3 (three) times daily between meals as needed.     metoprolol tartrate 25 MG tablet  Commonly known as:  LOPRESSOR  Take 25 mg by mouth 2 (two) times daily.     multivitamin with minerals Tabs  Take 1 tablet by mouth daily.     oxyCODONE-acetaminophen 5-325 MG per tablet  Commonly known as:  PERCOCET  Take 1-2 tablets by mouth every 4 (four) hours as needed for pain.     rosuvastatin 20 MG tablet  Commonly known as:  CRESTOR  Take 20 mg by mouth at bedtime.     traMADol 50 MG tablet  Commonly known as:  ULTRAM  Take 50 mg by mouth every 8 (eight) hours as needed. For pain     vitamin C 500 MG tablet  Commonly known as:  ASCORBIC ACID  Take 500 mg by mouth 2 (two) times daily.           Follow-up Information   Follow up with Shariq Puig C, MD In 10 days.   Contact information:   [address] [place] [address] [phone]       Signed: Javier Docker 04/07/2012, 8:14 AM  Physician Discharge Summary   Patient ID: Darrell [name] MRN: [id number] DOB/AGE: 1950-08-08 62 y.o.  Admit date: 04/01/2012 Discharge date: 04/07/2012  Primary Diagnosis:   DJD L5-S1  Admission Diagnoses:  Past Medical History  Diagnosis Date  . Hyperlipidemia   . Diverticulosis     slight -per colonoscopy, pt. asymptomatic- thus far  . GERD (gastroesophageal reflux disease)   . Insomnia   . Aortic stenosis 05/2011    Severe by recent echo; AoV area ~0.77-79 cm2, peak /mean gradient 86 mmHg/ 55 mmHg.  Marland Kitchen History of BPH   . Hypertension     [facility]- Dr. Herbie Baltimore   . Myocardial infarction   . Recurrent upper respiratory infection (URI)     cold- curently- "per pt., on the mend"  . Arthritis     back- low- [facility]- workmen's comp. situation   . Depression     quick temper- per pt.   . Hiatal hernia   . Prostate disease   . History of MRSA infection   . DDD (degenerative disc disease), lumbosacral   . NIDDM (non-insulin dependent diabetes mellitus)     Type 2 NIDDM x 5 years   Discharge Diagnoses:   Principal Problem:   DDD (degenerative disc disease), lumbar  Procedure:  Procedure(s) (LRB): ANTERIOR LUMBAR FUSION 1 LEVEL (N/A) ABDOMINAL EXPOSURE (N/A)   Consults: None  HPI:  See H&P    Laboratory Data: Hospital Outpatient Visit on 03/24/2012  Component Date Value Range Status  . WBC 03/24/2012 9.2  4.0 - 10.5 K/uL Final  . RBC 03/24/2012 4.52  4.22 - 5.81 MIL/uL Final  . Hemoglobin 03/24/2012 14.6  13.0 - 17.0 g/dL Final  . HCT 46/96/2952 42.2  39.0 - 52.0 % Final  . MCV 03/24/2012 93.4  78.0 - 100.0 fL Final  . MCH 03/24/2012 32.3  26.0 - 34.0 pg Final  . MCHC 03/24/2012 34.6  30.0 -  36.0 g/dL Final  . RDW 16/11/9602 12.7  11.5 - 15.5 % Final  . Platelets 03/24/2012 129* 150 - 400 K/uL Final  . Sodium 03/24/2012 139  135 - 145 mEq/L Final  .  Potassium 03/24/2012 4.7  3.5 - 5.1 mEq/L Final  . Chloride 03/24/2012 102  96 - 112 mEq/L Final  . CO2 03/24/2012 25  19 - 32 mEq/L Final  . Glucose, Bld 03/24/2012 77  70 - 99 mg/dL Final  . BUN 54/10/8117 21  6 - 23 mg/dL Final  . Creatinine, Ser 03/24/2012 0.79  0.50 - 1.35 mg/dL Final  . Calcium 14/78/2956 9.4  8.4 - 10.5 mg/dL Final  . Total Protein 03/24/2012 7.0  6.0 - 8.3 g/dL Final  . Albumin 21/30/8657 4.3  3.5 - 5.2 g/dL Final  . AST 84/69/6295 22  0 - 37 U/L Final  . ALT 03/24/2012 23  0 - 53 U/L Final  . Alkaline Phosphatase 03/24/2012 41  39 - 117 U/L Final  . Total Bilirubin 03/24/2012 0.3  0.3 - 1.2 mg/dL Final  . GFR calc non Af Amer 03/24/2012 >90  >90 mL/min Final  . GFR calc Af Amer 03/24/2012 >90  >90 mL/min Final   Comment:                                 The eGFR has been calculated                          using the CKD EPI equation.                          This calculation has not been                          validated in all clinical                          situations.                          eGFR's persistently                          <90 mL/min signify                          possible Chronic Kidney Disease.  Marland Kitchen Prothrombin Time 03/24/2012 12.9  11.6 - 15.2 seconds Final  . INR 03/24/2012 0.98  0.00 - 1.49 Final  . MRSA, PCR 03/24/2012 NEGATIVE  NEGATIVE Final  . Staphylococcus aureus 03/24/2012 NEGATIVE  NEGATIVE Final   Comment:                                 The Xpert SA Assay (FDA                          approved for NASAL specimens                          in patients over 49 years of age),  is one component of                          a comprehensive surveillance                          program.  Test performance has                          been validated by [facility] for patients greater                          than or equal to 60 year old.                          It is not intended                           to diagnose infection nor to                          guide or monitor treatment.  . ABO/RH(D) 03/24/2012 A POS   Final  . Antibody Screen 03/24/2012 NEG   Final  . Sample Expiration 03/24/2012 04/07/2012   Final   No results found for this basename: HGB,  in the last 72 hours No results found for this basename: WBC, RBC, HCT, PLT,  in the last 72 hours No results found for this basename: NA, K, CL, CO2, BUN, CREATININE, GLUCOSE, CALCIUM,  in the last 72 hours No results found for this basename: LABPT, INR,  in the last 72 hours  X-Rays:Dg Lumbar Spine 2-3 Views  04/02/2012  *RADIOLOGY REPORT*  Clinical Data: L5-S1 anterior fusion yesterday.  LUMBAR SPINE - 2-3 VIEW  Comparison: Intraoperative lumbar spine x-rays yesterday.  Findings: Anterior fusion with hardware at L5-S1 with interbody fusion plugs which are appropriately positioned within the disc space.  Hardware intact.  No complicating features.  Mild spondylosis involving the remainder of the lumbar spine with well- preserved disc spaces.  IMPRESSION: No complicating features post anterior L5-S1 fusion.   Original Report Authenticated By: Hulan Saas, M.D.    Dg Lumbar Spine 2-3 Views  04/01/2012  *RADIOLOGY REPORT*  Clinical Data: Anterior L5-S1 fusion.  DG C-ARM 61-120 MIN, LUMBAR SPINE - 2-3 VIEW  Technique: Two fluoroscopic spot views of the lower lumbar spine provided.  Comparison:  Plain films lumbar spine 03/24/2012.  Findings: Images demonstrate placement anterior plate and screws and interbody spacer at L5-S1.  No acute finding is identified.  IMPRESSION: Anterior L5-S1 fusion.   Original Report Authenticated By: Holley Dexter, M.D.    Dg Lumbar Spine 2-3 Views  03/24/2012  *RADIOLOGY REPORT*  Clinical Data: Degenerative disc disease in the lumbar spine.  LUMBAR SPINE - 2-3 VIEW  Comparison: None  Findings: There is marked narrowing of the L5-S1 disc space. Slight facet arthritis at L4-5 on the right.   Slight retrolisthesis of L2 on L3 and of L3 on L4.  Vascular calcifications in the splenic artery.  IMPRESSION: Degenerative disc disease at L5 S1.   Original Report Authenticated By: Francene Boyers, M.D.    Dg C-arm  61-120 Min  04/01/2012  *RADIOLOGY REPORT*  Clinical Data: Anterior L5-S1 fusion.  DG C-ARM 61-120 MIN, LUMBAR SPINE - 2-3 VIEW  Technique: Two fluoroscopic spot views of the lower lumbar spine provided.  Comparison:  Plain films lumbar spine 03/24/2012.  Findings: Images demonstrate placement anterior plate and screws and interbody spacer at L5-S1.  No acute finding is identified.  IMPRESSION: Anterior L5-S1 fusion.   Original Report Authenticated By: Holley Dexter, M.D.    Dg Or Local Abdomen  04/01/2012  *RADIOLOGY REPORT*  Clinical Data: Anterior fusion L5-S1.  OR LOCAL ABDOMEN  Comparison: None.  Findings: L5-S1 fusion hardware noted anteriorly.  No complicating features are identified.  Stable left renal artery aneurysm is noted.  The bowel gas pattern is unremarkable.  The soft tissue shadows are maintained.  IMPRESSION: Postop L5-S1 fusion.  No complicating features.   Original Report Authenticated By: Rudie Meyer, M.D.     EKG: Orders placed during the hospital encounter of 08/05/11  . EKG 12-LEAD  . EKG 12-LEAD  . EKG 12-LEAD  . EKG 12-LEAD  . EKG     Hospital Course: Patient was admitted to [facility] and taken to the OR and underwent the above state procedure without complications.  Patient tolerated the procedure well and was later transferred to the recovery room and then to the orthopaedic floor for postoperative care.  They were given PO and IV analgesics for pain control following their surgery.  They were given 24 hours of postoperative antibiotics.   PT was consulted postop to assist with mobility and transfers.  The patient was allowed to be WBAT with therapy and was taught back precautions. Discharge planning was consulted to help with postop disposition  and equipment needs.  Patient had a good  night on the evening of surgery and started to get up OOB with therapy on day one. Patient was seen in rounds and was ready to go home on day one.  They were given discharge instructions and dressing directions.  They were instructed on when to follow up in the office with Dr. Shelle Iron.  Discharge Medications: Prior to Admission medications   Medication Sig Start Date End Date Taking? Authorizing Provider  acetaminophen (TYLENOL) 500 MG tablet Take 1,000 mg by mouth every 6 (six) hours as needed. For pain   Yes Historical Provider, MD  glipiZIDE (GLUCOTROL XL) 10 MG 24 hr tablet Take 10 mg by mouth daily.   Yes Historical Provider, MD  losartan (COZAAR) 50 MG tablet Take 50 mg by mouth daily.   Yes Historical Provider, MD  metFORMIN (GLUCOPHAGE) 1000 MG tablet Take 1,000 mg by mouth 2 (two) times daily with a meal.   Yes Historical Provider, MD  metoprolol tartrate (LOPRESSOR) 25 MG tablet Take 25 mg by mouth 2 (two) times daily. 08/11/11 08/10/12 Yes Donielle Margaretann Loveless, PA  Multiple Vitamin (MULITIVITAMIN WITH MINERALS) TABS Take 1 tablet by mouth daily.   Yes Historical Provider, MD  rosuvastatin (CRESTOR) 20 MG tablet Take 20 mg by mouth at bedtime.    Yes Historical Provider, MD  traMADol (ULTRAM) 50 MG tablet Take 50 mg by mouth every 8 (eight) hours as needed. For pain   Yes Historical Provider, MD  vitamin C (ASCORBIC ACID) 500 MG tablet Take 500 mg by mouth 2 (two) times daily.    Yes Historical Provider, MD  docusate sodium (COLACE) 100 MG capsule Take 1 capsule (100 mg total) by mouth 2 (two) times daily. 04/01/12   Lockie Pares  Doralee Albino, PA-C  enoxaparin (LOVENOX) 40 MG/0.4ML injection Inject 0.4 mLs (40 mg total) into the skin daily. 04/01/12   Javier Docker, MD  methocarbamol (ROBAXIN) 500 MG tablet Take 1 tablet (500 mg total) by mouth 3 (three) times daily between meals as needed. 04/01/12   Javier Docker, MD  oxyCODONE-acetaminophen (PERCOCET) 5-325  MG per tablet Take 1-2 tablets by mouth every 4 (four) hours as needed for pain. 04/01/12   Javier Docker, MD    Diet: Regular diet Activity:WBAT Follow-up:in 10 days Disposition - Home Discharged Condition: good      Medication List    STOP taking these medications       aspirin 325 MG EC tablet     CINNAMON PO     GARLIC PO     MAGNESIUM-ZINC PO      TAKE these medications       acetaminophen 500 MG tablet  Commonly known as:  TYLENOL  Take 1,000 mg by mouth every 6 (six) hours as needed. For pain     docusate sodium 100 MG capsule  Commonly known as:  COLACE  Take 1 capsule (100 mg total) by mouth 2 (two) times daily.     enoxaparin 40 MG/0.4ML injection  Commonly known as:  LOVENOX  Inject 0.4 mLs (40 mg total) into the skin daily.     glipiZIDE 10 MG 24 hr tablet  Commonly known as:  GLUCOTROL XL  Take 10 mg by mouth daily.     losartan 50 MG tablet  Commonly known as:  COZAAR  Take 50 mg by mouth daily.     metFORMIN 1000 MG tablet  Commonly known as:  GLUCOPHAGE  Take 1,000 mg by mouth 2 (two) times daily with a meal.     methocarbamol 500 MG tablet  Commonly known as:  ROBAXIN  Take 1 tablet (500 mg total) by mouth 3 (three) times daily between meals as needed.     metoprolol tartrate 25 MG tablet  Commonly known as:  LOPRESSOR  Take 25 mg by mouth 2 (two) times daily.     multivitamin with minerals Tabs  Take 1 tablet by mouth daily.     oxyCODONE-acetaminophen 5-325 MG per tablet  Commonly known as:  PERCOCET  Take 1-2 tablets by mouth every 4 (four) hours as needed for pain.     rosuvastatin 20 MG tablet  Commonly known as:  CRESTOR  Take 20 mg by mouth at bedtime.     traMADol 50 MG tablet  Commonly known as:  ULTRAM  Take 50 mg by mouth every 8 (eight) hours as needed. For pain     vitamin C 500 MG tablet  Commonly known as:  ASCORBIC ACID  Take 500 mg by mouth 2 (two) times daily.           Follow-up Information   Follow  up with Kaynan Klonowski C, MD In 10 days.   Contact information:   [address] [place] [address] [phone]       Signed: Javier Docker 04/07/2012, 8:14 AM

## 2012-09-28 ENCOUNTER — Telehealth: Payer: Self-pay | Admitting: Cardiology

## 2012-09-28 ENCOUNTER — Ambulatory Visit (INDEPENDENT_AMBULATORY_CARE_PROVIDER_SITE_OTHER): Payer: BC Managed Care – PPO | Admitting: Cardiology

## 2012-09-28 ENCOUNTER — Encounter: Payer: Self-pay | Admitting: Cardiology

## 2012-09-28 VITALS — BP 160/60 | HR 84 | Ht 69.0 in | Wt 197.2 lb

## 2012-09-28 DIAGNOSIS — Z952 Presence of prosthetic heart valve: Secondary | ICD-10-CM

## 2012-09-28 DIAGNOSIS — E119 Type 2 diabetes mellitus without complications: Secondary | ICD-10-CM

## 2012-09-28 DIAGNOSIS — E785 Hyperlipidemia, unspecified: Secondary | ICD-10-CM

## 2012-09-28 DIAGNOSIS — I10 Essential (primary) hypertension: Secondary | ICD-10-CM

## 2012-09-28 DIAGNOSIS — I350 Nonrheumatic aortic (valve) stenosis: Secondary | ICD-10-CM

## 2012-09-28 DIAGNOSIS — I359 Nonrheumatic aortic valve disorder, unspecified: Secondary | ICD-10-CM

## 2012-09-28 DIAGNOSIS — Z954 Presence of other heart-valve replacement: Secondary | ICD-10-CM

## 2012-09-28 DIAGNOSIS — I517 Cardiomegaly: Secondary | ICD-10-CM

## 2012-09-28 MED ORDER — ROSUVASTATIN CALCIUM 20 MG PO TABS: 20.0000 mg | ORAL_TABLET | Freq: Every day | ORAL | Status: DC

## 2012-09-28 NOTE — Telephone Encounter (Signed)
Called the pharmacy and per [facility] adjusted Crestor to #[phone] and [phone]

## 2012-09-28 NOTE — Patient Instructions (Addendum)
Your physician wants you to follow-up in 86 month Dr Gar Gibbon will receive a reminder letter in the mail two months in advance. If you don't receive a letter, please call our office to schedule the follow-up appointment.   Your physician has requested that you have an echocardiogram. Echocardiography is a painless test that uses sound waves to create images of your heart. It provides your doctor with information about the size and shape of your heart and how well your heart's chambers and valves are working. This procedure takes approximately one hour. There are no restrictions for this procedure. Aug 2015

## 2012-09-28 NOTE — Telephone Encounter (Signed)
Please call Lucendia Herrlich at the pharmacy regarding Mr. Wyss Crestor Rx. She needs clarification on the way it was written.

## 2012-10-11 ENCOUNTER — Ambulatory Visit (HOSPITAL_COMMUNITY): Payer: Self-pay

## 2012-10-16 ENCOUNTER — Encounter: Payer: Self-pay | Admitting: Cardiology

## 2012-10-16 NOTE — Progress Notes (Signed)
Patient ID: Darrell [name], male   DOB: Jul 23, 1950, 62 y.o.   MRN: [id number] PCP: [name], MD  Clinic Note: Chief Complaint  Patient presents with  . Follow-up    One year, status post bioprosthetic AVR   HPI: Darrell [name] is a 62 y.o. male whom I referred for aortic valve replacement in June of 2013.  He had severe stenosis with hospital bicuspid valve and was having progressively worsening dyspnea on exertion.  He also had chest pain associated with it.  His grades were significant for the mean gradient of 55 mmHg.  He did quite well postoperatively from surgery.  I last saw him in November of 2013 for preoperative risk assessment for back surgery that was performed in February.    Interval History: He's starting to recover now from that.  He has gotten up and about moving around he started to get back into exercising again more than he had before, and has lost about 6 pounds since his last visit.  Described in his social history.  He has had a lot of social stress that has gotten somewhat agitated a little bit elevated blood pressure today.  But he denies any chest pain or shortness of breath associated with rest or exertion.  No PND, orthopnea, or edema.  No syncope/near syncope.  Apparently his ARB was stopped because of some lightheadedness though thought to be positional and orthostatic in nature.  He is not having heart lightheadedness since.  He did have one episode a few weeks back when he was changing a tire and doing some strenuous motion right pelvic be pulled muscle in his chest that hurt for a while and it had nothing suggestive of any symptoms in his chest.  He thinks he may have "tweaked" his surgical scar.  He denies any TIA or amaurosis fugax symptoms.  No melena, hematochezia or hematuria.  No claudication.  Past Medical History  Diagnosis Date  . Hyperlipidemia   . Diverticulosis     slight -per colonoscopy, pt. asymptomatic- thus far  . GERD  (gastroesophageal reflux disease)   . Insomnia   . Aortic stenosis 05/2011    Severe by recent echo; AoV area ~0.77-79 cm2, peak /mean gradient 86 mmHg/ 55 mmHg.  . S/P AVR (aortic valve replacement) 08/05/2011    Preop cath showed normal coronaries; AVR--Dr. Tyrone Sage, MD; 23 mm Magna Ease bioprosthetic Aortic Valve  . Hypertension     [facility]- Dr. Herbie Baltimore   . Myocardial infarction     By report only, cardiac catheterization was negative for ischemia.  . Recurrent upper respiratory infection (URI)     cold- curently- "per pt., on the mend"  . Arthritis     back- low- [facility]- workmen's comp. situation   . Depression     quick temper- per pt.   . Hiatal hernia   . Prostate disease   . History of MRSA infection   . DDD (degenerative disc disease), lumbosacral   . NIDDM (non-insulin dependent diabetes mellitus)     Type 2 NIDDM x 5 years  . History of BPH    Prior Cardiac Evaluation and Past Surgical History: Past Surgical History  Procedure Laterality Date  . Cardiac catheterization  06/2011     Minimal coronary disease  . Nasal septum [facility]      [facility]  . Aortic valve replacement  08/05/2011    Procedure: AORTIC VALVE REPLACEMENT (AVR);  Surgeon: Gwenith Daily  Tyrone Sage, MD;  Location: [facility] OR;  Service: Open Heart Surgery;  Laterality: N/A;  . Lumbar fusion  04/02/2012    Dr Shelle Iron  . Anterior lumbar fusion N/A 04/01/2012    Procedure: ANTERIOR LUMBAR FUSION 1 LEVEL;  Surgeon: Javier Docker, MD;  Location: [facility] OR;  Service: Orthopedics;  Laterality: N/A;  ALIF L5-S1  . Abdominal exposure N/A 04/01/2012    Procedure: ABDOMINAL EXPOSURE;  Surgeon: Chuck Hint, MD;  Location: [facility];  Service: Vascular;  Laterality: N/A;  . Nm myoview ltd  April 2013    Low risk.   Allergies  Allergen Reactions  . Tape Rash    Current Outpatient Prescriptions  Medication Sig Dispense Refill  . aspirin 325 MG tablet Take 325 mg by mouth daily.      . Garlic  1000 MG CAPS Take 1 capsule by mouth 2 (two) times daily.      Marland Kitchen glipiZIDE (GLUCOTROL XL) 10 MG 24 hr tablet Take 10 mg by mouth daily.      Marland Kitchen losartan (COZAAR) 50 MG tablet Take 50 mg by mouth daily.      . metFORMIN (GLUCOPHAGE) 1000 MG tablet Take 1,000 mg by mouth 2 (two) times daily with a meal.      . metoprolol tartrate (LOPRESSOR) 25 MG tablet Take 25 mg by mouth 2 (two) times daily.      . Multiple Vitamin (MULITIVITAMIN WITH MINERALS) TABS Take 1 tablet by mouth daily.      . traMADol (ULTRAM) 50 MG tablet Take 50 mg by mouth every 8 (eight) hours as needed. For pain      . vitamin C (ASCORBIC ACID) 500 MG tablet Take 500 mg by mouth 2 (two) times daily.       . rosuvastatin (CRESTOR) 20 MG tablet Take 1 tablet (20 mg total) by mouth daily.  90 tablet  3   No current facility-administered medications for this visit.    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 HE  HAS 16 GRANDCHILDREN AND 5 GREAT GRAND CHILDREN . HE  EXERCISES  NOW WALKING ABOUT 5-6 TIMES A WEEK ABOUT 30 MINUTES AT A TIME AND HAS NO PROBLEM. HE DOES NOT SMOKE ,DOES NOT DRINK ALCOHOL.   He mostly enjoys going out for walks with his grandkids.           ROS: A comprehensive Review of Systems - Negative except Positives noted above.  PHYSICAL EXAM BP 160/60  Pulse 84  Ht 5\' 9"  (1.753 m)  Wt 197 lb 3.2 oz (89.449 kg)  BMI 29.11 kg/m2 General appearance: alert, cooperative, appears stated age, no distress and Well nourished well groomed.  He has questions appropriately. Neck: no adenopathy, no carotid bruit, no JVD and supple, symmetrical, trachea midline Lungs: clear to auscultation bilaterally, normal percussion bilaterally and Nonlabored, good air movement Heart: regular rate and rhythm, S1, S2 normal, no S3 or S4, systolic murmur: systolic ejection 1/6, low pitch, crescendo, decrescendo and Late peaking at 2nd right intercostal space,  radiates to carotids, no click and no rub Abdomen: soft, non-tender; bowel sounds normal; no masses,  no organomegaly Extremities: extremities normal, atraumatic, no cyanosis or edema, no edema, redness or tenderness in the calves or thighs and no ulcers, gangrene or trophic changes Pulses: 2+ and symmetric Neurologic: Alert and oriented X 3, normal strength and tone. Normal symmetric reflexes. Normal coordination  and gait HEENT: [place]/AT, EOMI, MMM, anicteric sclera  [id number] today: Yes Rate:84 Rhythm: Normal sinus rhythm, nonspecific ST-T changes with slight ST segment flattening with downsloping T-wave inversions in leads 1, 2, 3, aVF, and V4 through V6;  inferior Q waves suggestive of inferior MI age undetermined  Recent Labs: None recently  ASSESSMENT / PLAN: Very stable status post AVR.  No adverse events and no complications. S/P AVR (aortic valve replacement) - for severe AS;  He seems to be doing very well after his surgery.  No adverse symptoms.  It was a bioprosthetic valve, so no need for anticoagulation.  He is due for a followup echocardiogram as a followup postoperatively before I see him back in a year.  HYPERLIPIDEMIA He is taking Crestor and seems to tolerate well.  His labs are followed by his primary physician.  HYPERTENSION His blood pressure is little elevated today.  Apparently he had stopped his ARB a little while ago.   Plan: Restart ARB at night to avoid orthostatic changes.  Continue beta blocker.  Could potentially increase dose versus changing to more potent beta blocker if need be.  He will need to monitor for signs of orthostasis  -- be discussing staying adequately hydrated to avoid orthostatic hypotension.  Type 2 diabetes mellitus On oral medications, followed by his primary physician.   Orders Placed This Encounter  Procedures  . EKG 12-Lead  . 2D Echocardiogram without contrast    Standing Status: Future     Number of Occurrences:      Standing  Expiration Date: 10/29/2013    Scheduling Instructions:     July- Aug 2015    Order Specific Question:  Type of Echo    Answer:  Complete    Order Specific Question:  Where should this test be performed    Answer:  [facility]    Order Specific Question:  Reason for exam-Echo    Answer:  Aortic Valve Disorder 424.1   Followup: One year  DAVID W. Herbie Baltimore, M.D., M.S. [facility] [address] [place], [place]  [address]  [phone] Pager # [phone]

## 2012-10-16 NOTE — Assessment & Plan Note (Addendum)
He seems to be doing very well after his surgery.  No adverse symptoms.  It was a bioprosthetic valve, so no need for anticoagulation.  He is due for a followup echocardiogram as a followup postoperatively before I see him back in a year.

## 2012-10-16 NOTE — Assessment & Plan Note (Addendum)
His blood pressure is little elevated today.  Apparently he had stopped his ARB a little while ago.   Plan: Restart ARB at night to avoid orthostatic changes.  Continue beta blocker.  Could potentially increase dose versus changing to more potent beta blocker if need be.  He will need to monitor for signs of orthostasis  -- be discussing staying adequately hydrated to avoid orthostatic hypotension.

## 2012-10-16 NOTE — Assessment & Plan Note (Signed)
He is taking Crestor and seems to tolerate well.  His labs are followed by his primary physician.

## 2012-10-16 NOTE — Assessment & Plan Note (Signed)
On oral medications, followed by his primary physician.

## 2012-11-09 ENCOUNTER — Telehealth: Payer: Self-pay | Admitting: *Deleted

## 2012-11-09 ENCOUNTER — Ambulatory Visit (HOSPITAL_COMMUNITY)
Admission: RE | Admit: 2012-11-09 | Discharge: 2012-11-09 | Disposition: A | Discharge status: Home / Self Care | Payer: BC Managed Care – PPO | Source: Ambulatory Visit | Attending: Cardiology | Admitting: Cardiology

## 2012-11-09 DIAGNOSIS — I359 Nonrheumatic aortic valve disorder, unspecified: Secondary | ICD-10-CM

## 2012-11-09 DIAGNOSIS — I517 Cardiomegaly: Secondary | ICD-10-CM

## 2012-11-09 DIAGNOSIS — I10 Essential (primary) hypertension: Secondary | ICD-10-CM | POA: Insufficient documentation

## 2012-11-09 DIAGNOSIS — Z09 Encounter for follow-up examination after completed treatment for conditions other than malignant neoplasm: Secondary | ICD-10-CM | POA: Insufficient documentation

## 2012-11-09 DIAGNOSIS — Z952 Presence of prosthetic heart valve: Secondary | ICD-10-CM | POA: Insufficient documentation

## 2012-11-09 DIAGNOSIS — Z954 Presence of other heart-valve replacement: Secondary | ICD-10-CM

## 2012-11-09 DIAGNOSIS — Z951 Presence of aortocoronary bypass graft: Secondary | ICD-10-CM | POA: Insufficient documentation

## 2012-11-09 NOTE — Telephone Encounter (Signed)
Spoke to patient. Result given . Verbalized understanding  

## 2012-11-09 NOTE — Progress Notes (Signed)
2D Echo Performed 11/09/2012    Rodneisha Bonnet, RCS  

## 2012-11-09 NOTE — Telephone Encounter (Signed)
Message copied by Tobin Chad on Tue Nov 09, 2012  4:49 PM ------      Message from: [facility], DAVID      Created: Tue Nov 09, 2012  1:08 PM       Valve looks good!      Pump function looks good.            Marykay Lex, MD       ------

## 2012-11-12 ENCOUNTER — Telehealth: Payer: Self-pay | Admitting: Family Medicine

## 2012-11-12 NOTE — Telephone Encounter (Signed)
Truetrack Test STrips test twice daily #50

## 2012-11-15 MED ORDER — GLUCOSE BLOOD VI STRP: ORAL_STRIP | Status: DC

## 2012-11-15 NOTE — Telephone Encounter (Signed)
Rx Refilled  

## 2012-11-22 ENCOUNTER — Encounter: Payer: Self-pay | Admitting: Family Medicine

## 2012-11-22 ENCOUNTER — Ambulatory Visit (INDEPENDENT_AMBULATORY_CARE_PROVIDER_SITE_OTHER): Payer: BC Managed Care – PPO | Admitting: Family Medicine

## 2012-11-22 VITALS — BP 142/72 | HR 96 | Temp 98.3°F | Resp 18 | Ht 69.0 in | Wt 198.0 lb

## 2012-11-22 DIAGNOSIS — L0390 Cellulitis, unspecified: Secondary | ICD-10-CM

## 2012-11-22 DIAGNOSIS — L309 Dermatitis, unspecified: Secondary | ICD-10-CM

## 2012-11-22 DIAGNOSIS — L0291 Cutaneous abscess, unspecified: Secondary | ICD-10-CM

## 2012-11-22 DIAGNOSIS — L259 Unspecified contact dermatitis, unspecified cause: Secondary | ICD-10-CM

## 2012-11-22 MED ORDER — CEPHALEXIN 500 MG PO CAPS: 500.0000 mg | ORAL_CAPSULE | Freq: Three times a day (TID) | ORAL | Status: DC

## 2012-11-22 MED ORDER — TRIAMCINOLONE ACETONIDE 0.1 % EX CREA: TOPICAL_CREAM | Freq: Two times a day (BID) | CUTANEOUS | Status: DC

## 2012-11-22 NOTE — Progress Notes (Signed)
Subjective:    Patient ID: Darrell [name], male    DOB: [birth year], 62 y.o.   MRN: [id number]  HPI Patient has a history of an aortic valve replacement due to severe aortic stenosis. He has an incision along his sternum from the surgery. The patient states that he developed a papular rash along the incision approximately one week ago that was itchy. He began scratching that. Now the area has become hot red and painful. It is approximately 14 cm x 20 cm of erythematous skin is warm to the touch and appears to be cellulitis. There also appears to be a central eczema that may be the initiation point of the secondary infection. Past Medical History  Diagnosis Date  . Hyperlipidemia   . Diverticulosis     slight -per colonoscopy, pt. asymptomatic- thus far  . GERD (gastroesophageal reflux disease)   . Insomnia   . Aortic stenosis 05/2011    Severe by recent echo; AoV area ~0.77-79 cm2, peak /mean gradient 86 mmHg/ 55 mmHg.  . S/P AVR (aortic valve replacement) 08/05/2011    Preop cath showed normal coronaries; AVR--Dr. Tyrone Sage, MD; 23 mm Magna Ease bioprosthetic Aortic Valve  . Hypertension     [facility]- Dr. Herbie Baltimore   . Myocardial infarction     By report only, cardiac catheterization was negative for ischemia.  . Recurrent upper respiratory infection (URI)     cold- curently- "per pt., on the mend"  . Arthritis     back- low- [facility]- workmen's comp. situation   . Depression     quick temper- per pt.   . Hiatal hernia   . Prostate disease   . History of MRSA infection   . DDD (degenerative disc disease), lumbosacral   . NIDDM (non-insulin dependent diabetes mellitus)     Type 2 NIDDM x 5 years  . History of BPH    Past Surgical History  Procedure Laterality Date  . Cardiac catheterization  06/2011     Minimal coronary disease  . Nasal septum surgery      [facility] ago  . Aortic valve replacement  08/05/2011    Procedure: AORTIC VALVE REPLACEMENT (AVR);   Surgeon: [name], MD;  Location: [facility];  Service: Open Heart Surgery;  Laterality: N/A;  . Lumbar fusion  04/02/2012    Dr Shelle Iron  . Anterior lumbar fusion N/A 04/01/2012    Procedure: ANTERIOR LUMBAR FUSION 1 LEVEL;  Surgeon: Javier Docker, MD;  Location: [facility] OR;  Service: Orthopedics;  Laterality: N/A;  ALIF L5-S1  . Abdominal exposure N/A 04/01/2012    Procedure: ABDOMINAL EXPOSURE;  Surgeon: Chuck Hint, MD;  Location: [facility];  Service: Vascular;  Laterality: N/A;  . Nm myoview ltd  April 2013    Low risk.   Current Outpatient Prescriptions on File Prior to Visit  Medication Sig Dispense Refill  . aspirin 325 MG tablet Take 325 mg by mouth daily.      . Garlic 1000 MG CAPS Take 1 capsule by mouth 2 (two) times daily.      Marland Kitchen glipiZIDE (GLUCOTROL XL) 10 MG 24 hr tablet Take 10 mg by mouth daily.      Marland Kitchen glucose blood test strip Checks BS bid -  DX: 250.00  100 each  5  . losartan (COZAAR) 50 MG tablet Take 50 mg by mouth daily.      . metFORMIN (GLUCOPHAGE) 1000 MG tablet Take 1,000 mg by mouth  2 (two) times daily with a meal.      . metoprolol tartrate (LOPRESSOR) 25 MG tablet Take 25 mg by mouth 2 (two) times daily.      . Multiple Vitamin (MULITIVITAMIN WITH MINERALS) TABS Take 1 tablet by mouth daily.      . rosuvastatin (CRESTOR) 20 MG tablet Take 1 tablet (20 mg total) by mouth daily.  90 tablet  3  . traMADol (ULTRAM) 50 MG tablet Take 50 mg by mouth every 8 (eight) hours as needed. For pain      . vitamin C (ASCORBIC ACID) 500 MG tablet Take 500 mg by mouth 2 (two) times daily.        No current facility-administered medications on file prior to visit.   Allergies  Allergen Reactions  . Tape Rash   History   Social History  . Marital Status: Married    Spouse Name: N/A    Number of Children: 5  . Years of Education: N/A   Occupational History  .      [employer]   Social History Main Topics  . Smoking status: Never Smoker   . Smokeless  tobacco: Never Used  . Alcohol Use: No  . Drug Use: No  . Sexual Activity: Yes   Other Topics Concern  . 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 HE  HAS 16 GRANDCHILDREN AND 5 GREAT GRAND CHILDREN . HE  EXERCISES  NOW WALKING ABOUT 5-6 TIMES A WEEK ABOUT 30 MINUTES AT A TIME AND HAS NO PROBLEM. HE DOES NOT SMOKE ,DOES NOT DRINK ALCOHOL.   He mostly enjoys going out for walks with his grandkids.              Review of Systems  All other systems reviewed and are negative.       Objective:   Physical Exam  Vitals reviewed. Constitutional: He appears well-developed and well-nourished.  Cardiovascular: Normal rate and regular rhythm.   Pulmonary/Chest: Effort normal and breath sounds normal.  Skin: Rash noted. There is erythema.   is a 14 cm x 20 cm erythematous patch of hot tender skin on his chest. There is a 6 cm x 6 cm patch of eczema-like rash in the center of the patch of cellulitis         Assessment & Plan:  1. Cellulitis I believe the patient had an initial eczema/dermatitis has gotten secondarily infected. Start the patient on Keflex 500 mg by mouth 3 times a day for 10 days. If the cellulitis is worsening, I would also cover MRSA with Bactrim. Patient is to recheck with me in 3-4 days if no better or sooner if worse - cephALEXin (KEFLEX) 500 MG capsule; Take 1 capsule (500 mg total) by mouth 3 (three) times daily.  Dispense: 30 capsule; Refill: 0  2. Eczema Also treat the original eczema with triamcinolone 0.1% cream apply twice a day for 7 days to the 6 x 6 cm central rash. - triamcinolone cream (KENALOG) 0.1 %; Apply topically 2 (two) times daily.  Dispense: 30 g; Refill: 0

## 2013-07-06 ENCOUNTER — Other Ambulatory Visit: Payer: BC Managed Care – PPO

## 2013-07-06 DIAGNOSIS — I10 Essential (primary) hypertension: Secondary | ICD-10-CM

## 2013-07-06 DIAGNOSIS — Z0000 Encounter for general adult medical examination without abnormal findings: Secondary | ICD-10-CM

## 2013-07-06 DIAGNOSIS — E119 Type 2 diabetes mellitus without complications: Secondary | ICD-10-CM

## 2013-07-06 DIAGNOSIS — E785 Hyperlipidemia, unspecified: Secondary | ICD-10-CM

## 2013-07-06 DIAGNOSIS — Z79899 Other long term (current) drug therapy: Secondary | ICD-10-CM

## 2013-07-06 LAB — COMPREHENSIVE METABOLIC PANEL
ALT: 22 U/L (ref 0–53)
AST: 19 U/L (ref 0–37)
Albumin: 4.2 g/dL (ref 3.5–5.2)
Alkaline Phosphatase: 53 U/L (ref 39–117)
BUN: 19 mg/dL (ref 6–23)
CALCIUM: 8.8 mg/dL (ref 8.4–10.5)
CHLORIDE: 102 meq/L (ref 96–112)
CO2: 28 mEq/L (ref 19–32)
CREATININE: 0.88 mg/dL (ref 0.50–1.35)
Glucose, Bld: 200 mg/dL — ABNORMAL HIGH (ref 70–99)
Potassium: 4.5 mEq/L (ref 3.5–5.3)
Sodium: 136 mEq/L (ref 135–145)
Total Bilirubin: 0.9 mg/dL (ref 0.2–1.2)
Total Protein: 6.4 g/dL (ref 6.0–8.3)

## 2013-07-06 LAB — CBC WITH DIFFERENTIAL/PLATELET
BASOS ABS: 0 10*3/uL (ref 0.0–0.1)
BASOS PCT: 0 % (ref 0–1)
EOS PCT: 3 % (ref 0–5)
Eosinophils Absolute: 0.2 10*3/uL (ref 0.0–0.7)
HEMATOCRIT: 45.3 % (ref 39.0–52.0)
Hemoglobin: 15.5 g/dL (ref 13.0–17.0)
LYMPHS PCT: 35 % (ref 12–46)
Lymphs Abs: 2.3 10*3/uL (ref 0.7–4.0)
MCH: 32.2 pg (ref 26.0–34.0)
MCHC: 34.2 g/dL (ref 30.0–36.0)
MCV: 94.2 fL (ref 78.0–100.0)
MONO ABS: 0.5 10*3/uL (ref 0.1–1.0)
Monocytes Relative: 8 % (ref 3–12)
Neutro Abs: 3.6 10*3/uL (ref 1.7–7.7)
Neutrophils Relative %: 54 % (ref 43–77)
Platelets: 91 10*3/uL — ABNORMAL LOW (ref 150–400)
RBC: 4.81 MIL/uL (ref 4.22–5.81)
RDW: 13.2 % (ref 11.5–15.5)
WBC: 6.6 10*3/uL (ref 4.0–10.5)

## 2013-07-06 LAB — LIPID PANEL
CHOLESTEROL: 192 mg/dL (ref 0–200)
HDL: 39 mg/dL — ABNORMAL LOW (ref >39)
LDL CALC: 128 mg/dL — AB (ref 0–99)
Total CHOL/HDL Ratio: 4.9 Ratio
Triglycerides: 127 mg/dL (ref <150)
VLDL: 25 mg/dL (ref 0–40)

## 2013-07-06 LAB — HEMOGLOBIN A1C
Hgb A1c MFr Bld: 9.1 % — ABNORMAL HIGH (ref <5.7)
Mean Plasma Glucose: 214 mg/dL — ABNORMAL HIGH (ref <117)

## 2013-07-07 LAB — PSA: PSA: 1.27 ng/mL (ref <=4.00)

## 2013-07-08 ENCOUNTER — Encounter: Payer: Self-pay | Admitting: Family Medicine

## 2013-07-08 ENCOUNTER — Ambulatory Visit (INDEPENDENT_AMBULATORY_CARE_PROVIDER_SITE_OTHER): Payer: BC Managed Care – PPO | Admitting: Family Medicine

## 2013-07-08 VITALS — BP 142/84 | HR 80 | Temp 97.0°F | Resp 16 | Ht 69.0 in | Wt 194.0 lb

## 2013-07-08 DIAGNOSIS — IMO0001 Reserved for inherently not codable concepts without codable children: Secondary | ICD-10-CM

## 2013-07-08 DIAGNOSIS — Z0000 Encounter for general adult medical examination without abnormal findings: Secondary | ICD-10-CM

## 2013-07-08 DIAGNOSIS — E1165 Type 2 diabetes mellitus with hyperglycemia: Principal | ICD-10-CM

## 2013-07-08 MED ORDER — METFORMIN HCL 1000 MG PO TABS: 1000.0000 mg | ORAL_TABLET | Freq: Two times a day (BID) | ORAL | Status: DC

## 2013-07-08 MED ORDER — ROSUVASTATIN CALCIUM 20 MG PO TABS: 20.0000 mg | ORAL_TABLET | Freq: Every day | ORAL | Status: DC

## 2013-07-08 MED ORDER — GLIPIZIDE ER 10 MG PO TB24: 10.0000 mg | ORAL_TABLET | Freq: Every day | ORAL | Status: DC

## 2013-07-08 MED ORDER — LOSARTAN POTASSIUM 50 MG PO TABS: 50.0000 mg | ORAL_TABLET | Freq: Every day | ORAL | Status: DC

## 2013-07-08 NOTE — Progress Notes (Signed)
Subjective:    Patient ID: Darrell [name], male    DOB: Mar 23, 1950, 63 y.o.   MRN: [id number]  HPI Patient presents today for complete physical exam. He also reports erectile dysfunction. He would like to try Viagra. His most recent lab work is listed below. The patient quit taking all of his medicines over 3 months ago. This explains the rapid rise in his hemoglobin A1c along with his cholesterol and his blood pressure. Of note he has a history of a 40% stenosis in his left anterior descending coronary artery. Therefore his goal LDL cholesterol is less than 70. His hemoglobin A1c has risen to 9.1. His blood pressure is also elevated as well. Patient had a colonoscopy in 2007.  His PSA was recently checked and was found to be normal. He has a history of receiving Pneumovax 23. His last tetanus vaccine was in February 2013. Preventive care is up to date. Past Medical History  Diagnosis Date  . Hyperlipidemia   . Diverticulosis     slight -per colonoscopy, pt. asymptomatic- thus far  . GERD (gastroesophageal reflux disease)   . Insomnia   . Aortic stenosis 05/2011    Severe by recent echo; AoV area ~0.77-79 cm2, peak /mean gradient 86 mmHg/ 55 mmHg.  . S/P AVR (aortic valve replacement) 08/05/2011    Preop cath showed normal coronaries; AVR--Dr. Servando Snare, MD; 23 mm Magna Ease bioprosthetic Aortic Valve  . Hypertension     [facility]- Dr. Ellyn Hack   . Myocardial infarction     By report only, cardiac catheterization was negative for ischemia.  . Recurrent upper respiratory infection (URI)     cold- curently- "per pt., on the mend"  . Arthritis     back- low- [facility]- workmen's comp. situation   . Depression     quick temper- per pt.   . Hiatal hernia   . Prostate disease   . History of MRSA infection   . DDD (degenerative disc disease), lumbosacral   . NIDDM (non-insulin dependent diabetes mellitus)     Type 2 NIDDM x 5 years  . History of BPH    Past Surgical History  Procedure  Laterality Date  . Cardiac catheterization  06/2011     Minimal coronary disease  . Nasal septum surgery      [facility]  . Aortic valve replacement  08/05/2011    Procedure: AORTIC VALVE REPLACEMENT (AVR);  Surgeon: [name], MD;  Location: [place];  Service: Open Heart Surgery;  Laterality: N/A;  . Lumbar fusion  04/02/2012    Dr [name]  . Anterior lumbar fusion N/A 04/01/2012    Procedure: ANTERIOR LUMBAR FUSION 1 LEVEL;  Surgeon: [name], MD;  Location: [place];  Service: Orthopedics;  Laterality: N/A;  ALIF L5-S1  . Abdominal exposure N/A 04/01/2012    Procedure: ABDOMINAL EXPOSURE;  Surgeon: Angelia Mould, MD;  Location: [facility] OR;  Service: Vascular;  Laterality: N/A;  . Nm myoview ltd  April 2013    Low risk.   Current Outpatient Prescriptions on File Prior to Visit  Medication Sig Dispense Refill  . aspirin 325 MG tablet Take 325 mg by mouth daily.      . Garlic 7341 MG CAPS Take 1 capsule by mouth 2 (two) times daily.      Marland Kitchen glucose blood test strip Checks BS bid -  DX: 250.00  100 each  5  . Multiple Vitamin (MULITIVITAMIN WITH MINERALS)  TABS Take 1 tablet by mouth daily.      . traMADol (ULTRAM) 50 MG tablet Take 50 mg by mouth every 8 (eight) hours as needed. For pain      . vitamin C (ASCORBIC ACID) 500 MG tablet Take 500 mg by mouth 2 (two) times daily.       . metoprolol tartrate (LOPRESSOR) 25 MG tablet Take 25 mg by mouth 2 (two) times daily.       No current facility-administered medications on file prior to visit.   Allergies  Allergen Reactions  . Tape Rash   History   Social History  . Marital Status: Married    Spouse Name: N/A    Number of Children: 86  . Years of Education: N/A   Occupational History  .      [employer]   Social History Main Topics  . Smoking status: Never Smoker   . Smokeless tobacco: Never Used  . Alcohol Use: No  . Drug Use: No  . Sexual Activity: Yes   Other Topics Concern  . 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 HE  HAS 16 GRANDCHILDREN AND 5 GREAT GRAND CHILDREN . HE  EXERCISES  NOW WALKING ABOUT 5-6 TIMES A WEEK ABOUT 30 MINUTES AT A TIME AND HAS NO PROBLEM. HE DOES NOT SMOKE ,DOES NOT DRINK ALCOHOL.   He mostly enjoys going out for walks with his grandkids.           Family History  Problem Relation Age of Onset  . Heart disease Mother   . Heart attack Brother   . Anesthesia problems Neg Hx       Review of Systems  All other systems reviewed and are negative.      Objective:   Physical Exam  Vitals reviewed. Constitutional: He is oriented to person, place, and time. He appears well-developed and well-nourished. No distress.  HENT:  Head: Normocephalic and atraumatic.  Right Ear: External ear normal.  Left Ear: External ear normal.  Nose: Nose normal.  Mouth/Throat: Oropharynx is clear and moist. No oropharyngeal exudate.  Eyes: Conjunctivae and EOM are normal. Pupils are equal, round, and reactive to light. Right eye exhibits no discharge. Left eye exhibits no discharge. No scleral icterus.  Neck: Normal range of motion. Neck supple. No JVD present. No tracheal deviation present. No thyromegaly present.  Cardiovascular: Normal rate, regular rhythm and intact distal pulses.  Exam reveals no gallop and no friction rub.   Murmur heard. Pulmonary/Chest: Effort normal and breath sounds normal. No stridor. No respiratory distress. He has no wheezes. He has no rales. He exhibits no tenderness.  Abdominal: Soft. Bowel sounds are normal. He exhibits no distension and no mass. There is no tenderness. There is no rebound and no guarding.  Musculoskeletal: Normal range of motion. He exhibits no edema and no tenderness.  Lymphadenopathy:    He has no cervical adenopathy.  Neurological: He is alert and oriented to person, place, and time. He has normal reflexes. He displays normal  reflexes. No cranial nerve deficit. He exhibits normal muscle tone. Coordination normal.  Skin: Skin is warm. No rash noted. He is not diaphoretic. No erythema. No pallor.  Psychiatric: He has a normal mood and affect. His behavior is normal. Judgment and thought content normal.          Assessment & Plan:  Type II or unspecified type diabetes mellitus without mention of complication, uncontrolled - Plan: glipiZIDE (GLUCOTROL XL) 10 MG 24 hr tablet, metFORMIN (GLUCOPHAGE) 1000 MG tablet  Routine general medical examination at a health care facility  Patient's physical exam today is completely normal. I have asked the patient to resume losartan 50 mg by mouth daily for his blood pressure. I would like to recheck his blood pressure as well as a urine microalbumin in 3 months. Also resume metformin 1000 mg by mouth twice a day as well as glipizide extended release 10 mg by mouth daily recheck hemoglobin A1c in 3 months. Due to his history of coronary artery disease also asked the patient to resume his aspirin and Crestor 20 mg by mouth daily. Recheck fasting lipid panel in 3 months. The remainder of his immunizations and preventative care is up-to-date.

## 2013-09-29 IMAGING — US US SCROTUM
1 series · 14 of 25 positions shown · non-contrast
Comparison: None in PACs

CLINICAL DATA: Painful left testicular mass for the past 3 weeks

EXAM:
SCROTAL ULTRASOUND
DOPPLER ULTRASOUND OF THE TESTICLES
TECHNIQUE: Complete ultrasound examination of the testicles, epididymis, and
other scrotal structures was performed. Color and spectral Doppler
ultrasound were also utilized to evaluate blood flow to the
testicles.

[Series 1: us scrotum · 0.07mm/px · 14 of 76 slices shown]
[im 1/76]
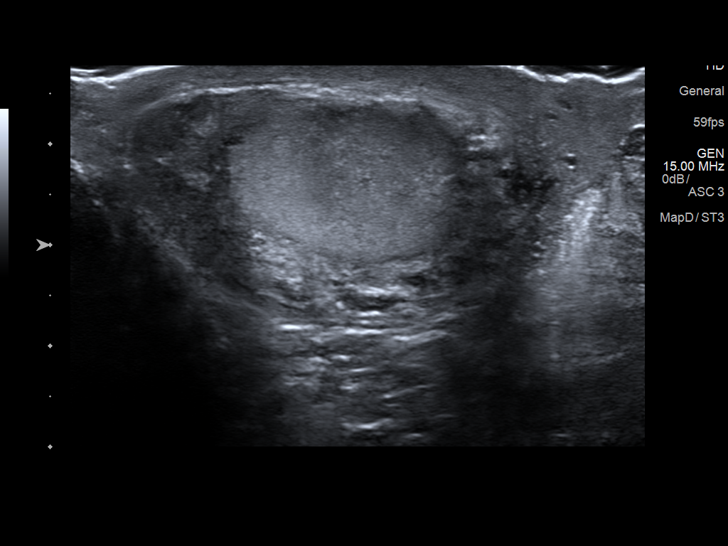
[im 7/76]
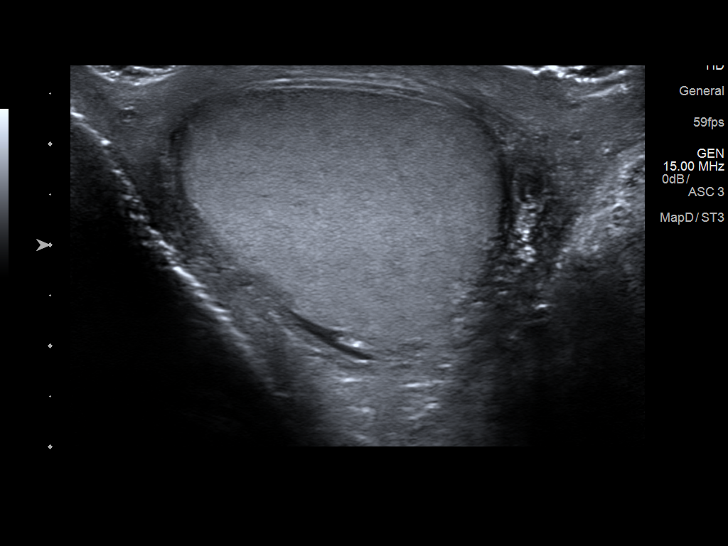
[im 13/76]
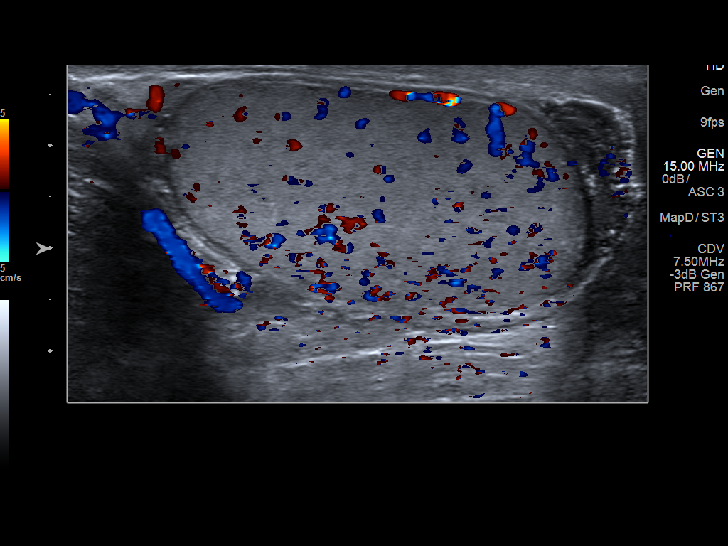
[im 19/76]
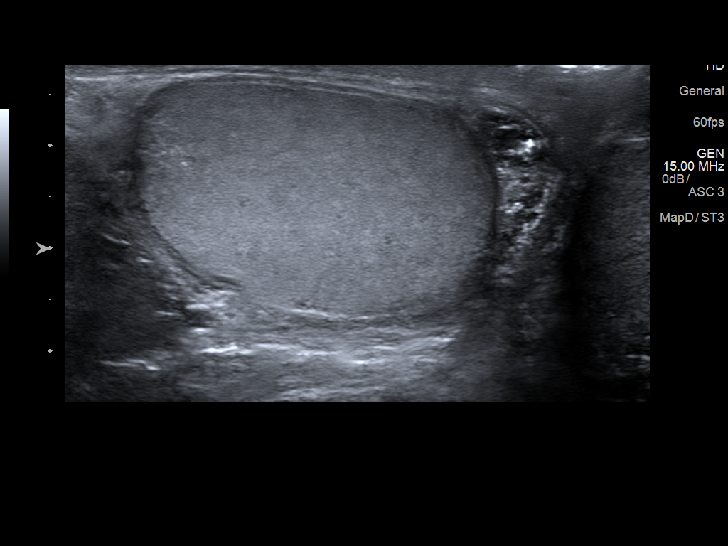
[im 26/76]
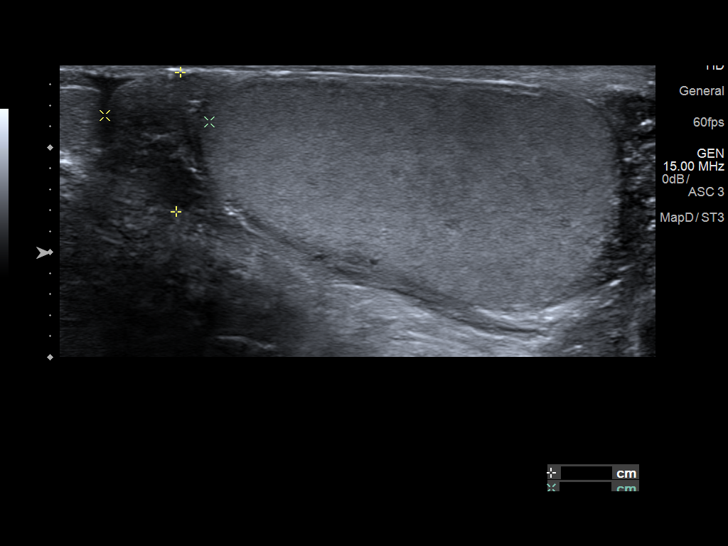
[im 29/76]
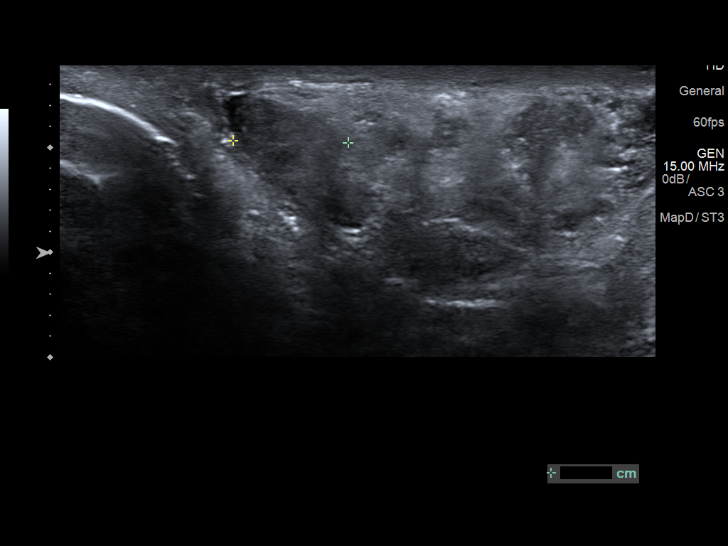
[im 35/76]
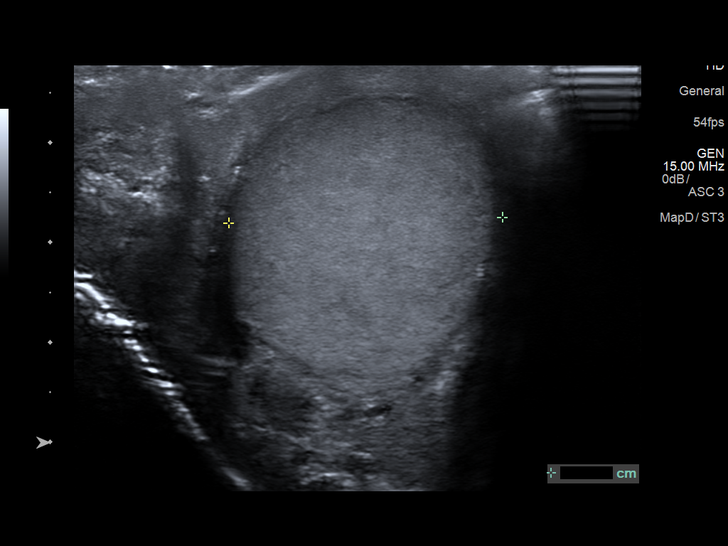
[im 41/76]
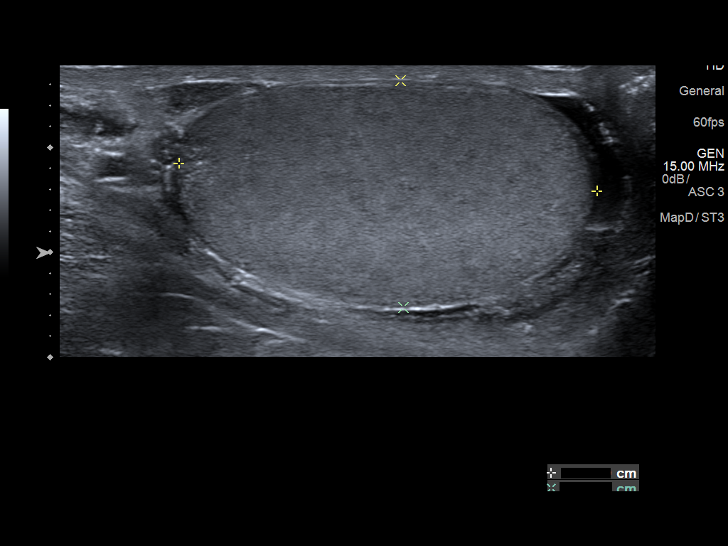
[im 47/76]
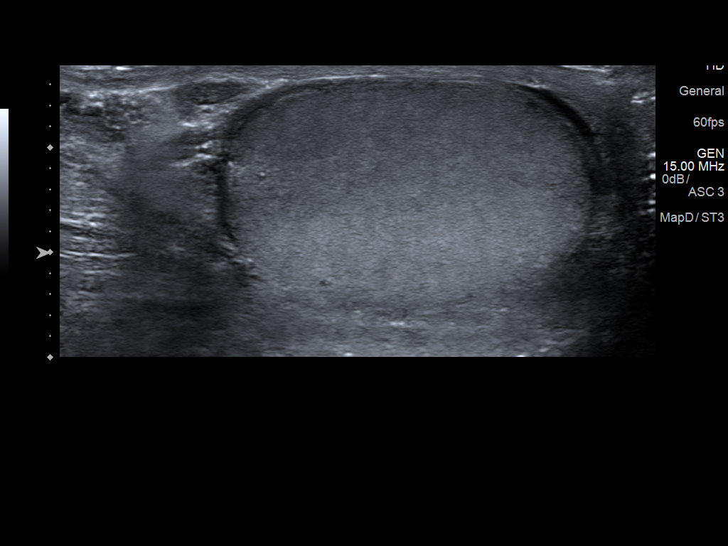
[im 51/76]
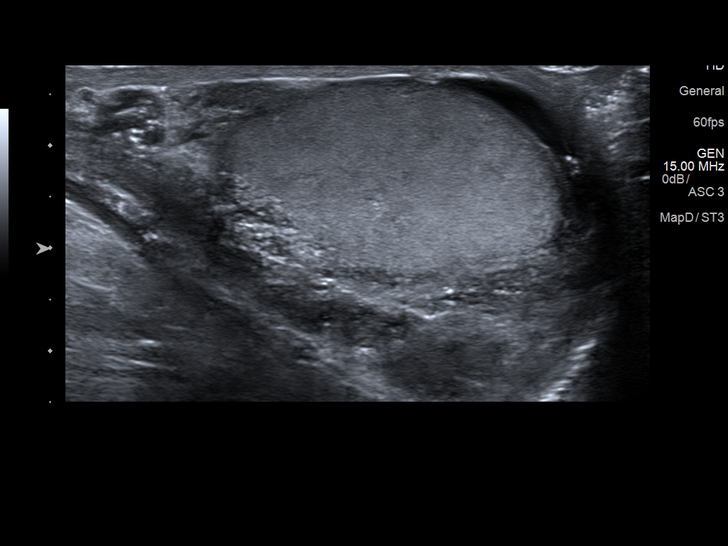
[im 57/76]
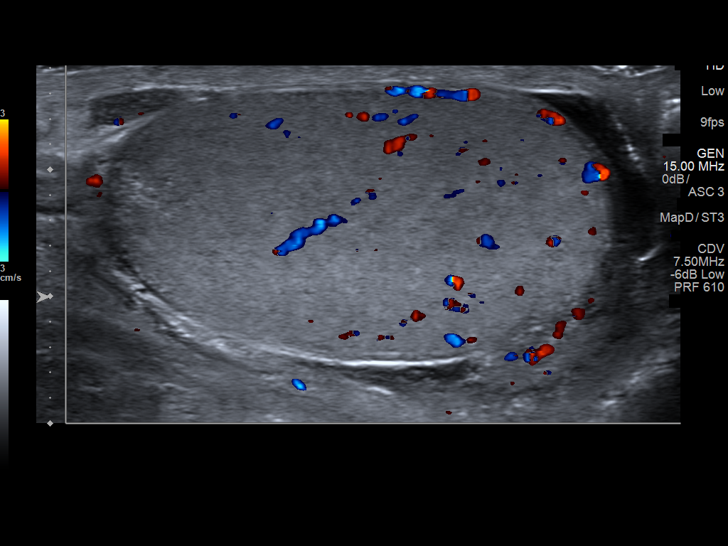
[im 63/76]
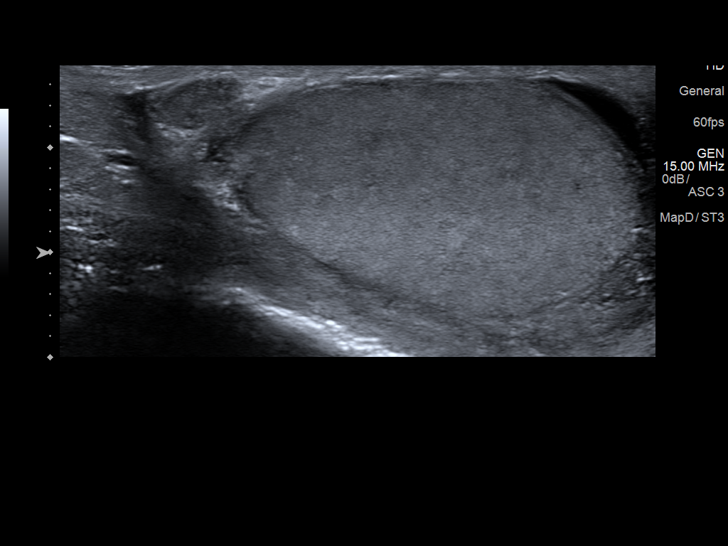
[im 69/76]
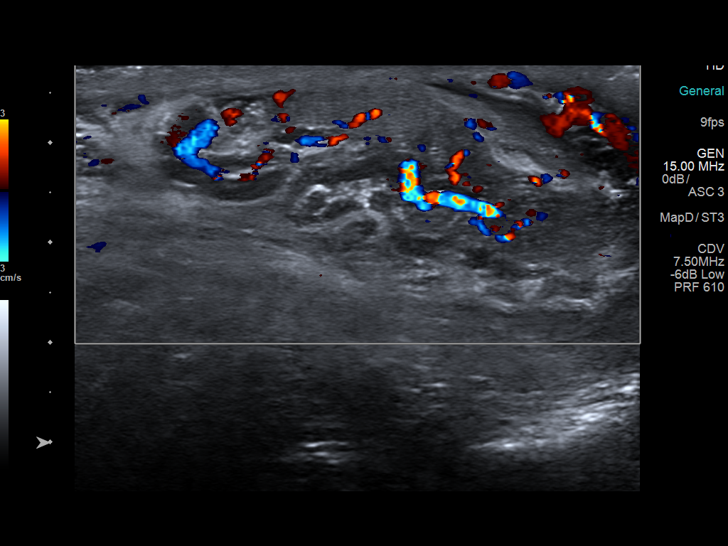
[im 76/76]
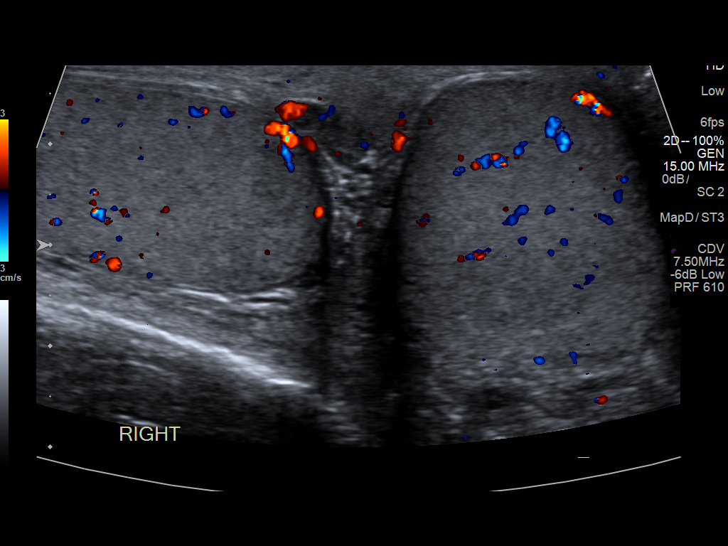

[14 of 25 positions shown; findings below may reference images not displayed]

FINDINGS: Right testicle

Measurements: 4.3 x 2.2 x 3.3 cm. No mass or microlithiasis
visualized.

Left testicle

Measurements: 4.0 x 2.2 x 2.7 cm. No mass or microlithiasis
visualized.

Right epididymis: Normal in size and appearance. It measures 1.3 x
1.0 x 1.1 cm

Left epididymis: The left epididymis is larger than the right and
demonstrates increased vascularity. It measures 1.0 x 1.2 x 1.8 cm

Hydrocele:  None visualized.

Varicocele:  There is a left-sided varicocele .

Pulsed Doppler interrogation of both testes demonstrates normal low
resistance arterial and venous waveforms bilaterally.
IMPRESSION: 1. The testes demonstrate no abnormal masses and exhibit normal
vascularity and echotexture.
2. Findings compatible with acute left-sided epididymitis.
3. Small left-sided varicocele.

## 2013-10-07 ENCOUNTER — Ambulatory Visit (INDEPENDENT_AMBULATORY_CARE_PROVIDER_SITE_OTHER): Payer: BC Managed Care – PPO | Admitting: Family Medicine

## 2013-10-07 ENCOUNTER — Encounter: Payer: Self-pay | Admitting: Family Medicine

## 2013-10-07 VITALS — BP 136/84 | HR 78 | Temp 98.0°F | Resp 14 | Ht 69.0 in | Wt 192.0 lb

## 2013-10-07 DIAGNOSIS — IMO0001 Reserved for inherently not codable concepts without codable children: Secondary | ICD-10-CM

## 2013-10-07 DIAGNOSIS — E1165 Type 2 diabetes mellitus with hyperglycemia: Principal | ICD-10-CM

## 2013-10-07 DIAGNOSIS — E785 Hyperlipidemia, unspecified: Secondary | ICD-10-CM

## 2013-10-07 DIAGNOSIS — I10 Essential (primary) hypertension: Secondary | ICD-10-CM

## 2013-10-07 LAB — LIPID PANEL
Cholesterol: 162 mg/dL (ref 0–200)
HDL: 40 mg/dL (ref >39)
LDL Cholesterol: 95 mg/dL (ref 0–99)
Total CHOL/HDL Ratio: 4.1 Ratio
Triglycerides: 133 mg/dL (ref <150)
VLDL: 27 mg/dL (ref 0–40)

## 2013-10-07 LAB — COMPLETE METABOLIC PANEL WITH GFR
ALBUMIN: 4.2 g/dL (ref 3.5–5.2)
ALK PHOS: 41 U/L (ref 39–117)
ALT: 19 U/L (ref 0–53)
AST: 18 U/L (ref 0–37)
BUN: 16 mg/dL (ref 6–23)
CO2: 25 mEq/L (ref 19–32)
Calcium: 9.2 mg/dL (ref 8.4–10.5)
Chloride: 106 mEq/L (ref 96–112)
Creat: 0.73 mg/dL (ref 0.50–1.35)
GFR, Est African American: >89 mL/min
GFR, Est Non African American: >89 mL/min
GLUCOSE: 108 mg/dL — AB (ref 70–99)
Potassium: 4 mEq/L (ref 3.5–5.3)
SODIUM: 140 meq/L (ref 135–145)
TOTAL PROTEIN: 6.7 g/dL (ref 6.0–8.3)
Total Bilirubin: 0.8 mg/dL (ref 0.2–1.2)

## 2013-10-07 LAB — HEMOGLOBIN A1C
HEMOGLOBIN A1C: 6.6 % — AB (ref <5.7)
Mean Plasma Glucose: 143 mg/dL — ABNORMAL HIGH (ref <117)

## 2013-10-07 LAB — MICROALBUMIN, URINE: Microalb, Ur: 0.5 mg/dL (ref 0.00–1.89)

## 2013-10-07 NOTE — Progress Notes (Signed)
Subjective:    Patient ID: Darrell [name], male    DOB: 1950/10/12, 63 y.o.   MRN: [id number]  HPI Patient is here today for recheck of his diabetes, hypertension, and hyperlipidemia. He has type 2 diabetes that is non-insulin-dependent.  He is currently taking glipizide as well as metformin. He denies any hypoglycemia. He denies any hypoglycemia. He denies any numbness or tingling in his feet. He denies any polyuria, polydipsia, blurred vision. His diabetic eye exam is up to date and he is taking an aspirin on a daily basis. His blood pressure is well controlled today. He denies any chest pain shortness of breath or dyspnea on exertion. He is taking Crestor 20 mg by mouth daily for hyperlipidemia. He denies any myalgias or right quadrant pain. Past Medical History  Diagnosis Date  . Hyperlipidemia   . Diverticulosis     slight -per colonoscopy, pt. asymptomatic- thus far  . GERD (gastroesophageal reflux disease)   . Insomnia   . Aortic stenosis 05/2011    Severe by recent echo; AoV area ~0.77-79 cm2, peak /mean gradient 86 mmHg/ 55 mmHg.  . S/P AVR (aortic valve replacement) 08/05/2011    Preop cath showed normal coronaries; AVR--Dr. Servando Snare, MD; 23 mm Magna Ease bioprosthetic Aortic Valve  . Hypertension     [facility]- Dr. Ellyn Hack   . Myocardial infarction     By report only, cardiac catheterization was negative for ischemia.  . Recurrent upper respiratory infection (URI)     cold- curently- "per pt., on the mend"  . Arthritis     back- low- [facility]- workmen's comp. situation   . Depression     quick temper- per pt.   . Hiatal hernia   . Prostate disease   . History of MRSA infection   . DDD (degenerative disc disease), lumbosacral   . NIDDM (non-insulin dependent diabetes mellitus)     Type 2 NIDDM x 5 years  . History of BPH    Past Surgical History  Procedure Laterality Date  . Cardiac catheterization  06/2011     Minimal coronary disease  . Nasal septum [facility]     [facility]  . Aortic valve replacement  08/05/2011    Procedure: AORTIC VALVE REPLACEMENT (AVR);  Surgeon: [name], MD;  Location: [place];  Service: Open Heart Surgery;  Laterality: N/A;  . Lumbar fusion  04/02/2012    Dr [name]  . Anterior lumbar fusion N/A 04/01/2012    Procedure: ANTERIOR LUMBAR FUSION 1 LEVEL;  Surgeon: [name], MD;  Location: [place];  Service: Orthopedics;  Laterality: N/A;  ALIF L5-S1  . Abdominal exposure N/A 04/01/2012    Procedure: ABDOMINAL EXPOSURE;  Surgeon: Angelia Mould, MD;  Location: [facility] OR;  Service: Vascular;  Laterality: N/A;  . Nm myoview ltd  April 2013    Low risk.   Current Outpatient Prescriptions on File Prior to Visit  Medication Sig Dispense Refill  . aspirin 325 MG tablet Take 325 mg by mouth daily.      . Garlic 2706 MG CAPS Take 1 capsule by mouth 2 (two) times daily.      Marland Kitchen glipiZIDE (GLUCOTROL XL) 10 MG 24 hr tablet Take 1 tablet (10 mg total) by mouth daily.  30 tablet  5  . glucose blood test strip Checks BS bid -  DX: 250.00  100 each  5  . losartan (COZAAR) 50 MG tablet Take 1 tablet (50  mg total) by mouth daily.  30 tablet  5  . metFORMIN (GLUCOPHAGE) 1000 MG tablet Take 1 tablet (1,000 mg total) by mouth 2 (two) times daily with a meal.  60 tablet  5  . Multiple Vitamin (MULITIVITAMIN WITH MINERALS) TABS Take 1 tablet by mouth daily.      . rosuvastatin (CRESTOR) 20 MG tablet Take 1 tablet (20 mg total) by mouth daily.  90 tablet  3  . traMADol (ULTRAM) 50 MG tablet Take 50 mg by mouth every 8 (eight) hours as needed. For pain      . vitamin C (ASCORBIC ACID) 500 MG tablet Take 500 mg by mouth 2 (two) times daily.        No current facility-administered medications on file prior to visit.   Allergies  Allergen Reactions  . Tape Rash   History   Social History  . Marital Status: Married    Spouse Name: N/A    Number of Children: 24  . Years of Education: N/A   Occupational History  .        [employer]   Social History Main Topics  . Smoking status: Never Smoker   . Smokeless tobacco: Never Used  . Alcohol Use: No  . Drug Use: No  . Sexual Activity: Yes   Other Topics Concern  . 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 HE  HAS 16 GRANDCHILDREN AND 5 GREAT GRAND CHILDREN . HE  EXERCISES  NOW WALKING ABOUT 5-6 TIMES A WEEK ABOUT 30 MINUTES AT A TIME AND HAS NO PROBLEM. HE DOES NOT SMOKE ,DOES NOT DRINK ALCOHOL.   He mostly enjoys going out for walks with his grandkids.           Family History  Problem Relation Age of Onset  . Heart disease Mother   . Heart attack Brother   . Anesthesia problems Neg Hx       Review of Systems  All other systems reviewed and are negative.      Objective:   Physical Exam  Vitals reviewed. Constitutional: He appears well-developed and well-nourished.  Neck: Neck supple. No JVD present. No thyromegaly present.  Cardiovascular: Normal rate, regular rhythm, normal heart sounds and intact distal pulses.  Exam reveals no gallop and no friction rub.   No murmur heard. Pulmonary/Chest: Effort normal and breath sounds normal. No respiratory distress. He has no wheezes. He has no rales.  Abdominal: Soft. Bowel sounds are normal. He exhibits no distension. There is no tenderness. There is no rebound and no guarding.  Musculoskeletal: He exhibits no edema.          Assessment & Plan:  1. Type II or unspecified type diabetes mellitus without mention of complication, uncontrolled Check the patient's hemoglobin A1c. Goal hemoglobin A1c less than 6.5. His diabetic foot exam and diabetic eye exam up-to-date. He is compliant with aspirin. - Hemoglobin A1c - Microalbumin, urine  2. Essential hypertension Blood pressure is well controlled. Continue current medications at their present dosages. - COMPLETE METABOLIC PANEL WITH GFR  3. HLD  (hyperlipidemia) Check fasting lipid panel. The LDL is less than 100. - COMPLETE METABOLIC PANEL WITH GFR - Lipid panel

## 2013-10-13 ENCOUNTER — Other Ambulatory Visit: Payer: Self-pay | Admitting: Cardiology

## 2013-10-13 NOTE — Telephone Encounter (Signed)
Rx refill sent to patient pharmacy with note to make appt. For further refills

## 2013-10-28 ENCOUNTER — Other Ambulatory Visit: Payer: Self-pay | Admitting: Family Medicine

## 2013-10-28 MED ORDER — ACCU-CHEK AVIVA PLUS W/DEVICE KIT: PACK | Status: DC

## 2013-10-28 NOTE — Telephone Encounter (Signed)
Strips, meter, lancets and solution sent to pharm

## 2013-11-22 ENCOUNTER — Telehealth: Payer: Self-pay | Admitting: Family Medicine

## 2013-11-22 DIAGNOSIS — E1165 Type 2 diabetes mellitus with hyperglycemia: Principal | ICD-10-CM

## 2013-11-22 DIAGNOSIS — IMO0001 Reserved for inherently not codable concepts without codable children: Secondary | ICD-10-CM

## 2013-11-22 MED ORDER — METFORMIN HCL 1000 MG PO TABS: 1000.0000 mg | ORAL_TABLET | Freq: Two times a day (BID) | ORAL | Status: DC

## 2013-11-22 MED ORDER — GLIPIZIDE ER 10 MG PO TB24: 10.0000 mg | ORAL_TABLET | Freq: Every day | ORAL | Status: DC

## 2013-11-22 NOTE — Telephone Encounter (Signed)
Medication refilled per protocol. 

## 2014-01-09 ENCOUNTER — Encounter: Payer: Self-pay | Admitting: Physician Assistant

## 2014-01-09 ENCOUNTER — Ambulatory Visit (INDEPENDENT_AMBULATORY_CARE_PROVIDER_SITE_OTHER): Payer: Medicare HMO | Admitting: Physician Assistant

## 2014-01-09 VITALS — BP 172/104 | HR 88 | Temp 97.8°F | Resp 20 | Wt 195.0 lb

## 2014-01-09 DIAGNOSIS — R319 Hematuria, unspecified: Secondary | ICD-10-CM

## 2014-01-09 LAB — URINALYSIS, ROUTINE W REFLEX MICROSCOPIC
BILIRUBIN URINE: NEGATIVE
GLUCOSE, UA: 100 mg/dL — AB
HGB URINE DIPSTICK: NEGATIVE
KETONES UR: NEGATIVE mg/dL
Leukocytes, UA: NEGATIVE
Nitrite: NEGATIVE
PH: 5.5 (ref 5.0–8.0)
Protein, ur: NEGATIVE mg/dL
Specific Gravity, Urine: 1.025 (ref 1.005–1.030)
Urobilinogen, UA: 0.2 mg/dL (ref 0.0–1.0)

## 2014-01-09 NOTE — Progress Notes (Signed)
Patient ID: Darrell [name] MRN: [id number], DOB: Jul 21, 1950, 63 y.o. Date of Encounter: 01/09/2014, 2:01 PM    Chief Complaint:  Chief Complaint  Patient presents with  . blood from penis    after erection, ?prostate problem     HPI: 63 y.o. year old white male States that he actually saw this blood in sperm--says that he is only seen it one time. Says the entire amount was of light pink color.  Says "it doesn't happen much anymore"-- Says " I have the thoughts, but can't get the follow through"--says Dr. Dennard Schaumann Rxed Viagra in past but he did not have success with that. Says that it probably been 6 months since he had ejaculated prior to this episode.   He is requesting a different medication to try to use in place of the Viagra.   Says that he was concerned that it could be a problem with his prostate or something.  Has seen no blood in urine.  Has had no pain in his pelvic area.     Home Meds:   Outpatient Prescriptions Prior to Visit  Medication Sig Dispense Refill  . aspirin 325 MG tablet Take 325 mg by mouth daily.    . Blood Glucose Monitoring Suppl (ACCU-CHEK AVIVA PLUS) W/DEVICE KIT Pt Needs meter - test strips - checks BS qd-bid DX-250.00 #200/3 refills - softclix lancets #200/3 - solution for controls. 1 kit 3  . Garlic 0300 MG CAPS Take 1 capsule by mouth 2 (two) times daily.    Marland Kitchen glipiZIDE (GLUCOTROL XL) 10 MG 24 hr tablet Take 1 tablet (10 mg total) by mouth daily. 90 tablet 1  . glucose blood test strip Checks BS bid -  DX: 250.00 100 each 5  . losartan (COZAAR) 50 MG tablet Take 1 tablet (50 mg total) by mouth daily. 30 tablet 5  . metFORMIN (GLUCOPHAGE) 1000 MG tablet Take 1 tablet (1,000 mg total) by mouth 2 (two) times daily with a meal. 180 tablet 1  . Multiple Vitamin (MULITIVITAMIN WITH MINERALS) TABS Take 1 tablet by mouth daily.    . rosuvastatin (CRESTOR) 20 MG tablet Take 1 tablet (20 mg total) by mouth daily. *Appointment needed for additional  refills* 30 tablet 0  . traMADol (ULTRAM) 50 MG tablet Take 50 mg by mouth every 8 (eight) hours as needed. For pain    . vitamin C (ASCORBIC ACID) 500 MG tablet Take 500 mg by mouth 2 (two) times daily.     . rosuvastatin (CRESTOR) 20 MG tablet Take 1 tablet (20 mg total) by mouth daily. 90 tablet 3   No facility-administered medications prior to visit.    Allergies:  Allergies  Allergen Reactions  . Tape Rash      Review of Systems: See HPI for pertinent ROS. All other ROS negative.    Physical Exam: Blood pressure 150/90, pulse 88, temperature 97.8 F (36.6 C), temperature source Oral, resp. rate 20, weight 195 lb (88.451 kg)., Body mass index is 28.78 kg/(m^2). General:  WNWD WM. Appears in no acute distress. Neck: Supple. No thyromegaly. No lymphadenopathy. Lungs: Clear bilaterally to auscultation without wheezes, rales, or rhonchi. Breathing is unlabored. Heart: Regular rhythm. No murmurs, rubs, or gallops. Abdomen: Soft, non-tender, non-distended with normoactive bowel sounds. No hepatomegaly. No rebound/guarding. No obvious abdominal masses. Msk:  Strength and tone normal for age. Extremities/Skin: Warm and dry. Neuro: Alert and oriented X 3. Moves all extremities spontaneously. Gait is normal. CNII-XII grossly in tact. Psych:  Responds to questions appropriately with a normal affect.   Results for orders placed or performed in visit on 01/09/14  Urinalysis, Routine w reflex microscopic  Result Value Ref Range   Color, Urine YELLOW YELLOW   APPearance CLEAR CLEAR   Specific Gravity, Urine 1.025 1.005 - 1.030   pH 5.5 5.0 - 8.0   Glucose, UA 100 (A) NEG mg/dL   Bilirubin Urine NEG NEG   Ketones, ur NEG NEG mg/dL   Hgb urine dipstick NEG NEG   Protein, ur NEG NEG mg/dL   Urobilinogen, UA 0.2 0.0 - 1.0 mg/dL   Nitrite NEG NEG   Leukocytes, UA NEG NEG     ASSESSMENT AND PLAN:  63 y.o. year old male with  1. Blood in urine - Urinalysis, Routine w reflex  microscopic  HematoSpermia  Discussed his urinalysis results with him. Discussed that PSA was normal 07/10/13. I recommended a referral to urology for follow-up regarding this. However he defers. I told him to definitely let us know if he has a recurrence of this and we definitely need to refer him to urology. He requested medication to use in place of Viagra and I have given him samples of Cialis 20 mg 3 samples.    Marin Olp [place], [place], [facility] 01/09/2014 2:01 PM

## 2014-01-26 ENCOUNTER — Telehealth: Payer: Self-pay | Admitting: Family Medicine

## 2014-01-26 NOTE — Telephone Encounter (Signed)
[phone]  Pt is needing refill on rosuvastatin (CRESTOR) 20 MG tablet [facility]

## 2014-01-27 MED ORDER — ROSUVASTATIN CALCIUM 20 MG PO TABS: 20.0000 mg | ORAL_TABLET | Freq: Every day | ORAL | Status: DC

## 2014-01-27 NOTE — Telephone Encounter (Signed)
Med sent to pharm 

## 2014-02-02 ENCOUNTER — Encounter (HOSPITAL_COMMUNITY): Payer: Self-pay | Admitting: Cardiovascular Disease

## 2014-02-10 ENCOUNTER — Ambulatory Visit (INDEPENDENT_AMBULATORY_CARE_PROVIDER_SITE_OTHER): Payer: Medicare HMO | Admitting: Family Medicine

## 2014-02-10 ENCOUNTER — Encounter: Payer: Self-pay | Admitting: Family Medicine

## 2014-02-10 VITALS — BP 118/68 | HR 74 | Temp 97.9°F | Resp 18 | Ht 69.0 in | Wt 197.0 lb

## 2014-02-10 DIAGNOSIS — I10 Essential (primary) hypertension: Secondary | ICD-10-CM

## 2014-02-10 DIAGNOSIS — E785 Hyperlipidemia, unspecified: Secondary | ICD-10-CM

## 2014-02-10 DIAGNOSIS — Z23 Encounter for immunization: Secondary | ICD-10-CM

## 2014-02-10 DIAGNOSIS — E119 Type 2 diabetes mellitus without complications: Secondary | ICD-10-CM

## 2014-02-10 LAB — COMPLETE METABOLIC PANEL WITH GFR
ALT: 17 U/L (ref 0–53)
AST: 15 U/L (ref 0–37)
Albumin: 3.8 g/dL (ref 3.5–5.2)
Alkaline Phosphatase: 46 U/L (ref 39–117)
BUN: 15 mg/dL (ref 6–23)
CALCIUM: 9.3 mg/dL (ref 8.4–10.5)
CHLORIDE: 104 meq/L (ref 96–112)
CO2: 28 meq/L (ref 19–32)
CREATININE: 0.86 mg/dL (ref 0.50–1.35)
GFR, Est Non African American: >89 mL/min
GLUCOSE: 118 mg/dL — AB (ref 70–99)
Potassium: 5.4 mEq/L — ABNORMAL HIGH (ref 3.5–5.3)
Sodium: 140 mEq/L (ref 135–145)
Total Bilirubin: 0.6 mg/dL (ref 0.2–1.2)
Total Protein: 6.3 g/dL (ref 6.0–8.3)

## 2014-02-10 LAB — LIPID PANEL
Cholesterol: 154 mg/dL (ref 0–200)
HDL: 37 mg/dL — ABNORMAL LOW (ref >39)
LDL Cholesterol: 88 mg/dL (ref 0–99)
TRIGLYCERIDES: 146 mg/dL (ref <150)
Total CHOL/HDL Ratio: 4.2 Ratio
VLDL: 29 mg/dL (ref 0–40)

## 2014-02-10 LAB — HEMOGLOBIN A1C
HEMOGLOBIN A1C: 6.6 % — AB (ref <5.7)
Mean Plasma Glucose: 143 mg/dL — ABNORMAL HIGH (ref <117)

## 2014-02-10 NOTE — Progress Notes (Signed)
Subjective:    Patient ID: Darrell [name], male    DOB: 18-Jun-1950, 63 y.o.   MRN: [id number]  HPI Patient is here today for follow-up of his hypertension, hyperlipidemia, and diabetes mellitus type 2. In November his blood pressure was extremely high. At that time the patient was upset. On recheck here today is 118/68. He denies any chest pain shortness of breath or dyspnea on exertion. His last hemoglobin A1c in August was excellent. He is due to have that checked again today. He denies any hypoglycemia. His blood sugars tend to run around 120. He denies any hypoglycemic episodes. He denies any myalgias or right upper quadrant pain on his cholesterol medication. His diabetic eye exam was performed earlier this year. His diabetic foot exam was performed today. He is due for a flu shot as well as his pneumonia vaccine. Past Medical History  Diagnosis Date  . Hyperlipidemia   . Diverticulosis     slight -per colonoscopy, pt. asymptomatic- thus far  . GERD (gastroesophageal reflux disease)   . Insomnia   . Aortic stenosis 05/2011    Severe by recent echo; AoV area ~0.77-79 cm2, peak /mean gradient 86 mmHg/ 55 mmHg.  . S/P AVR (aortic valve replacement) 08/05/2011    Preop cath showed normal coronaries; AVR--Dr. Servando Snare, MD; 23 mm Magna Ease bioprosthetic Aortic Valve  . Hypertension     [facility]- Dr. Ellyn Hack   . Myocardial infarction     By report only, cardiac catheterization was negative for ischemia.  . Recurrent upper respiratory infection (URI)     cold- curently- "per pt., on the mend"  . Arthritis     back- low- [facility]- workmen's comp. situation   . Depression     quick temper- per pt.   . Hiatal hernia   . Prostate disease   . History of MRSA infection   . DDD (degenerative disc disease), lumbosacral   . NIDDM (non-insulin dependent diabetes mellitus)     Type 2 NIDDM x 5 years  . History of BPH    Past Surgical History  Procedure Laterality Date  . Cardiac  catheterization  06/2011     Minimal coronary disease  . [facility]al [facility]um [facility]      [facility]  . Aortic valve replacement  08/05/2011    Procedure: AORTIC VALVE REPLACEMENT (AVR);  Surgeon: [name], MD;  Location: [place];  Service: Open Heart Surgery;  Laterality: N/A;  . Lumbar fusion  04/02/2012    Dr [name]  . Anterior lumbar fusion N/A 04/01/2012    Procedure: ANTERIOR LUMBAR FUSION 1 LEVEL;  Surgeon: [name], MD;  Location: [place];  Service: Orthopedics;  Laterality: N/A;  ALIF L5-S1  . Abdominal exposure N/A 04/01/2012    Procedure: ABDOMINAL EXPOSURE;  Surgeon: Angelia Mould, MD;  Location: [facility];  Service: Vascular;  Laterality: N/A;  . Nm myoview ltd  April 2013    Low risk.  . Left and right heart catheterization with coronary angiogram  07/03/2011    Procedure: LEFT AND RIGHT HEART CATHETERIZATION WITH CORONARY ANGIOGRAM;  Surgeon: [name], MD;  Location: [place] [facility];  Service: Cardiovascular;;   Current Outpatient Prescriptions on File Prior to Visit  Medication Sig Dispense Refill  . aspirin 325 MG tablet Take 325 mg by mouth daily.    . Blood Glucose Monitoring Suppl (ACCU-CHEK AVIVA PLUS) W/DEVICE KIT Pt Needs meter - test strips - checks BS qd-bid  DX-250.00 #200/3 refills - softclix lancets #200/3 - solution for controls. 1 kit 3  . Garlic 3545 MG CAPS Take 1 capsule by mouth 2 (two) times daily.    Marland Kitchen glipiZIDE (GLUCOTROL XL) 10 MG 24 hr tablet Take 1 tablet (10 mg total) by mouth daily. 90 tablet 1  . glucose blood test strip Checks BS bid -  DX: 250.00 100 each 5  . losartan (COZAAR) 50 MG tablet Take 1 tablet (50 mg total) by mouth daily. 30 tablet 5  . metFORMIN (GLUCOPHAGE) 1000 MG tablet Take 1 tablet (1,000 mg total) by mouth 2 (two) times daily with a meal. 180 tablet 1  . Multiple Vitamin (MULITIVITAMIN WITH MINERALS) TABS Take 1 tablet by mouth daily.    . rosuvastatin (CRESTOR) 20 MG tablet Take 1 tablet (20 mg total)  by mouth daily. *Appointment needed for additional refills* 30 tablet 0  . traMADol (ULTRAM) 50 MG tablet Take 50 mg by mouth every 8 (eight) hours as needed. For pain    . vitamin C (ASCORBIC ACID) 500 MG tablet Take 500 mg by mouth 2 (two) times daily.      No current facility-administered medications on file prior to visit.   Allergies  Allergen Reactions  . Tape Rash   History   Social History  . Marital Status: Married    Spouse Name: N/A    Number of Children: 63  . Years of Education: N/A   Occupational History  .      [employer]   Social History Main Topics  . Smoking status: Never Smoker   . Smokeless tobacco: Never Used  . Alcohol Use: No  . Drug Use: No  . Sexual Activity: Yes   Other Topics Concern  . 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 HE  HAS 16 GRANDCHILDREN AND 5 GREAT GRAND CHILDREN . HE  EXERCISES  NOW WALKING ABOUT 5-6 TIMES A WEEK ABOUT 30 MINUTES AT A TIME AND HAS NO PROBLEM. HE DOES NOT SMOKE ,DOES NOT DRINK ALCOHOL.   He mostly enjoys going out for walks with his grandkids.              Review of Systems  All other systems reviewed and are negative.      Objective:   Physical Exam  Neck: Neck supple. No JVD present. No thyromegaly present.  Cardiovascular: Normal rate and regular rhythm.   Murmur heard. Pulmonary/Chest: Effort normal and breath sounds normal. No respiratory distress. He has no wheezes. He has no rales.  Abdominal: Soft. Bowel sounds are normal. He exhibits no distension. There is no tenderness. There is no rebound and no guarding.  Musculoskeletal: He exhibits no edema.  Vitals reviewed.         Assessment & Plan:  Diabetes mellitus type II, controlled - Plan: COMPLETE METABOLIC PANEL WITH GFR, Lipid panel, Hemoglobin A1c  Essential hypertension  HLD (hyperlipidemia)  A she received his flu shot as well as the pneumonia  vaccine today. His blood pressure is much better. I did ask the patient to begin monitoring his blood pressure daily at home. I will check a hemoglobin A1c. Urine microalbumin is up-to-date. I'll also check a fasting lipid panel. Goal LDL cholesterol is less than 100

## 2014-02-10 NOTE — Addendum Note (Signed)
Addended by: Shary Decamp B on: 02/10/2014 10:05 AM   Modules accepted: Orders

## 2014-02-13 ENCOUNTER — Encounter: Payer: Self-pay | Admitting: *Deleted

## 2014-04-26 ENCOUNTER — Telehealth: Payer: Self-pay | Admitting: Family Medicine

## 2014-04-26 ENCOUNTER — Other Ambulatory Visit: Payer: Self-pay | Admitting: *Deleted

## 2014-04-26 DIAGNOSIS — IMO0002 Reserved for concepts with insufficient information to code with codable children: Secondary | ICD-10-CM

## 2014-04-26 DIAGNOSIS — E1165 Type 2 diabetes mellitus with hyperglycemia: Secondary | ICD-10-CM

## 2014-04-26 MED ORDER — GLUCOSE BLOOD VI STRP: ORAL_STRIP | Status: DC

## 2014-04-26 MED ORDER — ACCU-CHEK AVIVA VI SOLN: Status: DC

## 2014-04-26 MED ORDER — METFORMIN HCL 1000 MG PO TABS: 1000.0000 mg | ORAL_TABLET | Freq: Two times a day (BID) | ORAL | Status: DC

## 2014-04-26 MED ORDER — ACCU-CHEK AVIVA PLUS W/DEVICE KIT: PACK | Status: DC

## 2014-04-26 MED ORDER — GLIPIZIDE ER 10 MG PO TB24: 10.0000 mg | ORAL_TABLET | Freq: Every day | ORAL | Status: DC

## 2014-04-26 MED ORDER — ACCU-CHEK SOFTCLIX LANCET DEV MISC: Status: DC

## 2014-04-26 NOTE — Telephone Encounter (Signed)
Medication refilled per protocol. 

## 2014-04-26 NOTE — Telephone Encounter (Signed)
Received fax requesting refill on DM supplies.   Refill appropriate and filled per protocol.  

## 2014-05-03 ENCOUNTER — Telehealth: Payer: Self-pay | Admitting: Family Medicine

## 2014-05-04 ENCOUNTER — Ambulatory Visit: Payer: Medicare HMO | Admitting: Physician Assistant

## 2014-05-09 ENCOUNTER — Encounter: Payer: Self-pay | Admitting: Family Medicine

## 2014-05-09 ENCOUNTER — Ambulatory Visit (INDEPENDENT_AMBULATORY_CARE_PROVIDER_SITE_OTHER): Payer: Medicare PPO | Admitting: Family Medicine

## 2014-05-09 VITALS — BP 162/100 | HR 100 | Temp 98.1°F | Resp 18 | Ht 69.0 in | Wt 182.0 lb

## 2014-05-09 DIAGNOSIS — F329 Major depressive disorder, single episode, unspecified: Secondary | ICD-10-CM

## 2014-05-09 DIAGNOSIS — F32A Depression, unspecified: Secondary | ICD-10-CM

## 2014-05-09 MED ORDER — FLUOXETINE HCL 20 MG PO TABS: 20.0000 mg | ORAL_TABLET | Freq: Every day | ORAL | Status: DC

## 2014-05-09 NOTE — Progress Notes (Signed)
Subjective:    Patient ID: Darrell [name], male    DOB: 1950/04/17, 64 y.o.   MRN: [id number]  HPI  Patient is dealing with severe depression. Symptoms began several months ago. He is very attached to his granddaughter. His daughter-in-law has forbade him from seeing her. Recently the grandfather went to her home to give the granddaughter a birthday present and the police were called. He has a court date pending on this. This is cause tremendous stress for this patient. He has lost approximately 15 pounds since when I last saw him in December. He has no appetite. He has to force himself to eat. He has no energy. He cannot sleep. He has constant anxiety. He reports severe depression and anhedonia. At times he wonders if he would be better off dead although he states that he has no suicidal plan. Past Medical History  Diagnosis Date  . Hyperlipidemia   . Diverticulosis     slight -per colonoscopy, pt. asymptomatic- thus far  . GERD (gastroesophageal reflux disease)   . Insomnia   . Aortic stenosis 05/2011    Severe by recent echo; AoV area ~0.77-79 cm2, peak /mean gradient 86 mmHg/ 55 mmHg.  . S/P AVR (aortic valve replacement) 08/05/2011    Preop cath showed normal coronaries; AVR--Dr. Servando Snare, MD; 23 mm Magna Ease bioprosthetic Aortic Valve  . Hypertension     [facility]- Dr. Ellyn Hack   . Myocardial infarction     By report only, cardiac catheterization was negative for ischemia.  . Recurrent upper respiratory infection (URI)     cold- curently- "per pt., on the mend"  . Arthritis     back- low- [facility]- workmen's comp. situation   . Depression     quick temper- per pt.   . Hiatal hernia   . Prostate disease   . History of MRSA infection   . DDD (degenerative disc disease), lumbosacral   . NIDDM (non-insulin dependent diabetes mellitus)     Type 2 NIDDM x 5 years  . History of BPH    Past Surgical History  Procedure Laterality Date  . Cardiac catheterization  06/2011    Minimal coronary disease  . Nasal septum [facility]      [facility]  . Aortic valve replacement  08/05/2011    Procedure: AORTIC VALVE REPLACEMENT (AVR);  Surgeon: [name], MD;  Location: [place];  Service: Open Heart Surgery;  Laterality: N/A;  . Lumbar fusion  04/02/2012    Dr [name]  . Anterior lumbar fusion N/A 04/01/2012    Procedure: ANTERIOR LUMBAR FUSION 1 LEVEL;  Surgeon: [name], MD;  Location: [place];  Service: Orthopedics;  Laterality: N/A;  ALIF L5-S1  . Abdominal exposure N/A 04/01/2012    Procedure: ABDOMINAL EXPOSURE;  Surgeon: Angelia Mould, MD;  Location: [facility] OR;  Service: Vascular;  Laterality: N/A;  . Nm myoview ltd  April 2013    Low risk.  . Left and right heart catheterization with coronary angiogram  07/03/2011    Procedure: LEFT AND RIGHT HEART CATHETERIZATION WITH CORONARY ANGIOGRAM;  Surgeon: [name], MD;  Location: [place] [facility];  Service: Cardiovascular;;   Current Outpatient Prescriptions on File Prior to Visit  Medication Sig Dispense Refill  . aspirin 325 MG tablet Take 325 mg by mouth daily.    . Blood Glucose Calibration (ACCU-CHEK AVIVA) SOLN Use to monitor FSBS 2x daily- Dx: E11.65. 3 each 3  . Blood Glucose  Monitoring Suppl (ACCU-CHEK AVIVA PLUS) W/DEVICE KIT Use to monitor FSBS 2x daily- Dx: E11.65. 1 kit 3  . co-enzyme Q-10 50 MG capsule Take 50 mg by mouth daily.    . Garlic 7867 MG CAPS Take 1 capsule by mouth 2 (two) times daily.    Marland Kitchen glipiZIDE (GLUCOTROL XL) 10 MG 24 hr tablet Take 1 tablet (10 mg total) by mouth daily. 90 tablet 1  . glucose blood (ACCU-CHEK AVIVA) test strip Use to monitor FSBS 2x daily- Dx: E11.65. 300 each 3  . Lancet Devices (ACCU-CHEK SOFTCLIX) lancets Use to monitor FSBS 2x daily- Dx: E11.65. 3 each 3  . losartan (COZAAR) 50 MG tablet Take 1 tablet (50 mg total) by mouth daily. 30 tablet 5  . metFORMIN (GLUCOPHAGE) 1000 MG tablet Take 1 tablet (1,000 mg total) by mouth 2 (two) times  daily with a meal. 180 tablet 1  . Multiple Vitamin (MULITIVITAMIN WITH MINERALS) TABS Take 1 tablet by mouth daily.    . rosuvastatin (CRESTOR) 20 MG tablet Take 1 tablet (20 mg total) by mouth daily. *Appointment needed for additional refills* 30 tablet 0  . traMADol (ULTRAM) 50 MG tablet Take 50 mg by mouth every 8 (eight) hours as needed. For pain    . vitamin C (ASCORBIC ACID) 500 MG tablet Take 500 mg by mouth 2 (two) times daily.      No current facility-administered medications on file prior to visit.   Allergies  Allergen Reactions  . Tape Rash   History   Social History  . Marital Status: Married    Spouse Name: N/A  . Number of Children: 5  . Years of Education: N/A   Occupational History  .      [employer]   Social History Main Topics  . Smoking status: Never Smoker   . Smokeless tobacco: Never Used  . Alcohol Use: No  . Drug Use: No  . Sexual Activity: Yes   Other Topics Concern  . 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 HE  HAS 16 GRANDCHILDREN AND 5 GREAT GRAND CHILDREN . HE  EXERCISES  NOW WALKING ABOUT 5-6 TIMES A WEEK ABOUT 30 MINUTES AT A TIME AND HAS NO PROBLEM. HE DOES NOT SMOKE ,DOES NOT DRINK ALCOHOL.   He mostly enjoys going out for walks with his grandkids.             Review of Systems  All other systems reviewed and are negative.      Objective:   Physical Exam  Cardiovascular: Normal rate and regular rhythm.   Murmur heard. Pulmonary/Chest: Effort normal and breath sounds normal. No respiratory distress. He has no wheezes. He has no rales.  Abdominal: Soft. Bowel sounds are normal.  Vitals reviewed.         Assessment & Plan:  Depression - Plan: FLUoxetine (PROZAC) 20 MG tablet  Patient has severe depression. I want him to start Prozac 20 mg by mouth daily and I would like to see the patient back in 3-4 weeks to recheck. He denies any suicidal  ideation. I will see the patient back immediately if his symptoms worsen.

## 2014-05-10 ENCOUNTER — Other Ambulatory Visit: Payer: Self-pay | Admitting: Family Medicine

## 2014-05-10 DIAGNOSIS — IMO0002 Reserved for concepts with insufficient information to code with codable children: Secondary | ICD-10-CM

## 2014-05-10 DIAGNOSIS — E1165 Type 2 diabetes mellitus with hyperglycemia: Secondary | ICD-10-CM

## 2014-05-10 MED ORDER — METFORMIN HCL 1000 MG PO TABS: 1000.0000 mg | ORAL_TABLET | Freq: Two times a day (BID) | ORAL | Status: DC

## 2014-05-10 MED ORDER — GLIPIZIDE ER 10 MG PO TB24: 10.0000 mg | ORAL_TABLET | Freq: Every day | ORAL | Status: DC

## 2014-05-10 NOTE — Telephone Encounter (Signed)
Med sent to pharm 

## 2014-07-03 ENCOUNTER — Encounter: Payer: Self-pay | Admitting: Gastroenterology

## 2014-08-04 ENCOUNTER — Encounter: Payer: Self-pay | Admitting: Family Medicine

## 2014-08-04 ENCOUNTER — Ambulatory Visit (INDEPENDENT_AMBULATORY_CARE_PROVIDER_SITE_OTHER): Payer: Medicare PPO | Admitting: Family Medicine

## 2014-08-04 VITALS — BP 170/100 | HR 94 | Temp 98.3°F | Resp 18 | Ht 69.0 in | Wt 180.0 lb

## 2014-08-04 DIAGNOSIS — Z831 Family history of other infectious and parasitic diseases: Secondary | ICD-10-CM

## 2014-08-04 DIAGNOSIS — E119 Type 2 diabetes mellitus without complications: Secondary | ICD-10-CM

## 2014-08-04 DIAGNOSIS — I10 Essential (primary) hypertension: Secondary | ICD-10-CM

## 2014-08-04 LAB — COMPLETE METABOLIC PANEL WITH GFR
ALBUMIN: 4.2 g/dL (ref 3.5–5.2)
ALT: 18 U/L (ref 0–53)
AST: 15 U/L (ref 0–37)
Alkaline Phosphatase: 53 U/L (ref 39–117)
BUN: 23 mg/dL (ref 6–23)
CHLORIDE: 108 meq/L (ref 96–112)
CO2: 21 mEq/L (ref 19–32)
CREATININE: 1.01 mg/dL (ref 0.50–1.35)
Calcium: 9.4 mg/dL (ref 8.4–10.5)
GFR, Est African American: >89 mL/min
GFR, Est Non African American: 78 mL/min
Glucose, Bld: 123 mg/dL — ABNORMAL HIGH (ref 70–99)
Potassium: 4.1 mEq/L (ref 3.5–5.3)
SODIUM: 140 meq/L (ref 135–145)
Total Bilirubin: 0.7 mg/dL (ref 0.2–1.2)
Total Protein: 6.5 g/dL (ref 6.0–8.3)

## 2014-08-04 LAB — HEMOGLOBIN A1C
Hgb A1c MFr Bld: 6.1 % — ABNORMAL HIGH (ref <5.7)
Mean Plasma Glucose: 128 mg/dL — ABNORMAL HIGH (ref <117)

## 2014-08-04 MED ORDER — AMLODIPINE BESYLATE 10 MG PO TABS: 10.0000 mg | ORAL_TABLET | Freq: Every day | ORAL | Status: DC
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
## 2014-08-07 ENCOUNTER — Telehealth: Payer: Self-pay | Admitting: Family Medicine

## 2014-08-07 NOTE — Telephone Encounter (Signed)
Martinique houchins calling from child protective services calling to f/u with you regarding this patients appointment on Friday  [phone] ext [phone]

## 2014-08-08 LAB — HSV(HERPES SMPLX)ABS-I+II(IGG+IGM)-BLD
HSV 1 GLYCOPROTEIN G AB, IGG: 1.62 IV — AB
HSV 2 Glycoprotein G Ab, IgG: <0.1 IV
Herpes Simplex Vrs I&II-IgM Ab (EIA): 1.65 INDEX — ABNORMAL HIGH

## 2014-09-06 ENCOUNTER — Telehealth: Payer: Self-pay | Admitting: Family Medicine

## 2014-09-06 DIAGNOSIS — F32A Depression, unspecified: Secondary | ICD-10-CM

## 2014-09-06 DIAGNOSIS — F329 Major depressive disorder, single episode, unspecified: Secondary | ICD-10-CM

## 2014-09-06 MED ORDER — FLUOXETINE HCL 20 MG PO TABS: 20.0000 mg | ORAL_TABLET | Freq: Every day | ORAL | Status: DC

## 2014-09-06 NOTE — Telephone Encounter (Signed)
Medication called/sent to requested pharmacy  

## 2014-09-06 NOTE — Telephone Encounter (Signed)
Patient requesting refill on his prozac  [facility]  He said he already called his pharmacy

## 2014-09-08 ENCOUNTER — Ambulatory Visit (HOSPITAL_COMMUNITY): Payer: Self-pay | Admitting: Psychology

## 2014-11-13 ENCOUNTER — Other Ambulatory Visit: Payer: Self-pay | Admitting: Family Medicine

## 2015-03-02 ENCOUNTER — Other Ambulatory Visit: Payer: Medicare PPO

## 2015-03-02 ENCOUNTER — Other Ambulatory Visit: Payer: Self-pay | Admitting: Family Medicine

## 2015-03-02 DIAGNOSIS — Z79899 Other long term (current) drug therapy: Secondary | ICD-10-CM

## 2015-03-02 DIAGNOSIS — Z0000 Encounter for general adult medical examination without abnormal findings: Secondary | ICD-10-CM

## 2015-03-02 DIAGNOSIS — E785 Hyperlipidemia, unspecified: Secondary | ICD-10-CM

## 2015-03-02 DIAGNOSIS — E119 Type 2 diabetes mellitus without complications: Secondary | ICD-10-CM

## 2015-03-02 DIAGNOSIS — N400 Enlarged prostate without lower urinary tract symptoms: Secondary | ICD-10-CM

## 2015-03-02 DIAGNOSIS — I10 Essential (primary) hypertension: Secondary | ICD-10-CM

## 2015-03-02 LAB — TSH: TSH: 1.568 u[IU]/mL (ref 0.350–4.500)

## 2015-03-02 LAB — CBC WITH DIFFERENTIAL/PLATELET
Basophils Absolute: 0 10*3/uL (ref 0.0–0.1)
Basophils Relative: 0 % (ref 0–1)
Eosinophils Absolute: 0.2 10*3/uL (ref 0.0–0.7)
Eosinophils Relative: 3 % (ref 0–5)
HCT: 45 % (ref 39.0–52.0)
Hemoglobin: 15.8 g/dL (ref 13.0–17.0)
Lymphocytes Relative: 33 % (ref 12–46)
Lymphs Abs: 2.5 10*3/uL (ref 0.7–4.0)
MCH: 32.8 pg (ref 26.0–34.0)
MCHC: 35.1 g/dL (ref 30.0–36.0)
MCV: 93.6 fL (ref 78.0–100.0)
MPV: 9.7 fL (ref 8.6–12.4)
Monocytes Absolute: 0.5 10*3/uL (ref 0.1–1.0)
Monocytes Relative: 7 % (ref 3–12)
Neutro Abs: 4.3 10*3/uL (ref 1.7–7.7)
Neutrophils Relative %: 57 % (ref 43–77)
Platelets: 143 10*3/uL — ABNORMAL LOW (ref 150–400)
RBC: 4.81 MIL/uL (ref 4.22–5.81)
RDW: 13.6 % (ref 11.5–15.5)
WBC: 7.5 10*3/uL (ref 4.0–10.5)

## 2015-03-02 LAB — LIPID PANEL
CHOLESTEROL: 249 mg/dL — AB (ref 125–200)
HDL: 44 mg/dL (ref >=40)
LDL Cholesterol: 175 mg/dL — ABNORMAL HIGH (ref <130)
Total CHOL/HDL Ratio: 5.7 Ratio — ABNORMAL HIGH (ref <=5.0)
Triglycerides: 149 mg/dL (ref <150)
VLDL: 30 mg/dL (ref <30)

## 2015-03-02 LAB — COMPLETE METABOLIC PANEL WITH GFR
ALBUMIN: 4.1 g/dL (ref 3.6–5.1)
ALK PHOS: 51 U/L (ref 40–115)
ALT: 24 U/L (ref 9–46)
AST: 19 U/L (ref 10–35)
BUN: 14 mg/dL (ref 7–25)
CALCIUM: 9 mg/dL (ref 8.6–10.3)
CO2: 25 mmol/L (ref 20–31)
CREATININE: 0.85 mg/dL (ref 0.70–1.25)
Chloride: 102 mmol/L (ref 98–110)
GFR, Est Non African American: >89 mL/min (ref >=60)
Glucose, Bld: 160 mg/dL — ABNORMAL HIGH (ref 70–99)
Potassium: 4 mmol/L (ref 3.5–5.3)
Sodium: 138 mmol/L (ref 135–146)
Total Bilirubin: 0.5 mg/dL (ref 0.2–1.2)
Total Protein: 6.5 g/dL (ref 6.1–8.1)

## 2015-03-03 LAB — PSA, MEDICARE: PSA: 1.16 ng/mL (ref <=4.00)

## 2015-03-05 ENCOUNTER — Encounter: Payer: Self-pay | Admitting: Family Medicine

## 2015-03-05 ENCOUNTER — Other Ambulatory Visit: Payer: Self-pay | Admitting: Family Medicine

## 2015-03-05 ENCOUNTER — Ambulatory Visit: Payer: Medicare Other | Admitting: Family Medicine

## 2015-03-05 ENCOUNTER — Ambulatory Visit (INDEPENDENT_AMBULATORY_CARE_PROVIDER_SITE_OTHER): Payer: Medicare Other | Admitting: Family Medicine

## 2015-03-05 VITALS — BP 136/92 | HR 74 | Temp 98.3°F | Resp 18 | Ht 69.0 in | Wt 198.0 lb

## 2015-03-05 DIAGNOSIS — E785 Hyperlipidemia, unspecified: Secondary | ICD-10-CM | POA: Diagnosis not present

## 2015-03-05 DIAGNOSIS — I10 Essential (primary) hypertension: Secondary | ICD-10-CM

## 2015-03-05 DIAGNOSIS — Z0000 Encounter for general adult medical examination without abnormal findings: Secondary | ICD-10-CM | POA: Diagnosis not present

## 2015-03-05 DIAGNOSIS — E1165 Type 2 diabetes mellitus with hyperglycemia: Secondary | ICD-10-CM

## 2015-03-05 DIAGNOSIS — F329 Major depressive disorder, single episode, unspecified: Secondary | ICD-10-CM | POA: Diagnosis not present

## 2015-03-05 DIAGNOSIS — F32A Depression, unspecified: Secondary | ICD-10-CM

## 2015-03-05 DIAGNOSIS — IMO0001 Reserved for inherently not codable concepts without codable children: Secondary | ICD-10-CM

## 2015-03-05 DIAGNOSIS — R5382 Chronic fatigue, unspecified: Secondary | ICD-10-CM | POA: Diagnosis not present

## 2015-03-05 LAB — HEMOGLOBIN A1C
HEMOGLOBIN A1C: 7.2 % — AB (ref <5.7)
Mean Plasma Glucose: 160 mg/dL — ABNORMAL HIGH (ref <117)

## 2015-03-05 LAB — VITAMIN B12: Vitamin B-12: 366 pg/mL (ref 211–911)

## 2015-03-05 MED ORDER — VENLAFAXINE HCL ER 75 MG PO CP24
75.0000 mg | ORAL_CAPSULE | Freq: Every day | ORAL | Status: DC
Start: 2015-03-05 — End: 2015-09-20

## 2015-03-05 NOTE — Progress Notes (Signed)
Subjective:    Patient ID: Darrell [name], male    DOB: 11-14-1950, 65 y.o.   MRN: [id number]  HPI  Past Medical History  Diagnosis Date  . Hyperlipidemia   . Diverticulosis     slight -per colonoscopy, pt. asymptomatic- thus far  . GERD (gastroesophageal reflux disease)   . Insomnia   . Aortic stenosis 05/2011    Severe by recent echo; AoV area ~0.77-79 cm2, peak /mean gradient 86 mmHg/ 55 mmHg.  . S/P AVR (aortic valve replacement) 08/05/2011    Preop cath showed normal coronaries; AVR--Dr. Servando Snare, MD; 23 mm Magna Ease bioprosthetic Aortic Valve  . Hypertension     [facility]- Dr. Ellyn Hack   . Myocardial infarction ([place])     By report only, cardiac catheterization was negative for ischemia.  . Recurrent upper respiratory infection (URI)     cold- curently- "per pt., on the mend"  . Arthritis     back- low- [facility]- workmen's comp. situation   . Depression     quick temper- per pt.   . Hiatal hernia   . Prostate disease   . History of MRSA infection   . DDD (degenerative disc disease), lumbosacral   . NIDDM (non-insulin dependent diabetes mellitus)     Type 2 NIDDM x 5 years  . History of BPH    Past Surgical History  Procedure Laterality Date  . Cardiac catheterization  06/2011     Minimal coronary disease  . Nasal septum surgery      [facility]  . Aortic valve replacement  08/05/2011    Procedure: AORTIC VALVE REPLACEMENT (AVR);  Surgeon: [name], MD;  Location: [place];  Service: Open Heart Surgery;  Laterality: N/A;  . Lumbar fusion  04/02/2012    Dr [name]  . Anterior lumbar fusion N/A 04/01/2012    Procedure: ANTERIOR LUMBAR FUSION 1 LEVEL;  Surgeon: [name], MD;  Location: [place];  Service: Orthopedics;  Laterality: N/A;  ALIF L5-S1  . Abdominal exposure N/A 04/01/2012    Procedure: ABDOMINAL EXPOSURE;  Surgeon: Angelia Mould, MD;  Location: [facility] OR;  Service: Vascular;  Laterality: N/A;  . Nm myoview ltd  April 2013   Low risk.  . Left and right heart catheterization with coronary angiogram  07/03/2011    Procedure: LEFT AND RIGHT HEART CATHETERIZATION WITH CORONARY ANGIOGRAM;  Surgeon: [name], MD;  Location: [place] [facility];  Service: Cardiovascular;;   Current Outpatient Prescriptions on File Prior to Visit  Medication Sig Dispense Refill  . ACCU-CHEK SOFTCLIX LANCETS lancets     . amLODipine (NORVASC) 10 MG tablet Take 1 tablet (10 mg total) by mouth daily. 30 tablet 5  . aspirin 325 MG tablet Take 325 mg by mouth daily.    . Blood Glucose Calibration (ACCU-CHEK AVIVA) SOLN Use to monitor FSBS 2x daily- Dx: E11.65. 3 each 3  . Blood Glucose Monitoring Suppl (ACCU-CHEK AVIVA PLUS) W/DEVICE KIT Use to monitor FSBS 2x daily- Dx: E11.65. 1 kit 3  . co-enzyme Q-10 50 MG capsule Take 50 mg by mouth daily.    Marland Kitchen FLUoxetine (PROZAC) 20 MG tablet Take 1 tablet (20 mg total) by mouth daily. 30 tablet 5  . Garlic 4174 MG CAPS Take 1 capsule by mouth 2 (two) times daily.    Marland Kitchen glipiZIDE (GLUCOTROL XL) 10 MG 24 hr tablet TAKE 1 TABLET (10 MG TOTAL) BY MOUTH DAILY. 90 tablet 1  . glucose  blood (ACCU-CHEK AVIVA) test strip Use to monitor FSBS 2x daily- Dx: E11.65. 300 each 3  . Lancet Devices (ACCU-CHEK SOFTCLIX) lancets Use to monitor FSBS 2x daily- Dx: E11.65. 3 each 3  . losartan (COZAAR) 50 MG tablet Take 1 tablet (50 mg total) by mouth daily. 30 tablet 5  . metFORMIN (GLUCOPHAGE) 1000 MG tablet Take 1 tablet (1,000 mg total) by mouth 2 (two) times daily with a meal. 180 tablet 1  . Multiple Vitamin (MULITIVITAMIN WITH MINERALS) TABS Take 1 tablet by mouth daily.    . rosuvastatin (CRESTOR) 20 MG tablet Take 1 tablet (20 mg total) by mouth daily. *Appointment needed for additional refills* 30 tablet 0  . vitamin C (ASCORBIC ACID) 500 MG tablet Take 500 mg by mouth 2 (two) times daily.      No current facility-administered medications on file prior to visit.   Allergies  Allergen Reactions  . Tape Rash    Social History   Social History  . Marital Status: Married    Spouse Name: N/A  . Number of Children: 5  . Years of Education: N/A   Occupational History  .      [employer]   Social History Main Topics  . Smoking status: Never Smoker   . Smokeless tobacco: Never Used  . Alcohol Use: No  . Drug Use: No  . Sexual Activity: Yes   Other Topics Concern  . 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 HE  HAS 16 GRANDCHILDREN AND 5 GREAT GRAND CHILDREN . HE  EXERCISES  NOW WALKING ABOUT 5-6 TIMES A WEEK ABOUT 30 MINUTES AT A TIME AND HAS NO PROBLEM. HE DOES NOT SMOKE ,DOES NOT DRINK ALCOHOL.   He mostly enjoys going out for walks with his grandkids.           Family History  Problem Relation Age of Onset  . Heart disease Mother   . Heart attack Brother   . Anesthesia problems Neg Hx      Review of Systems  All other systems reviewed and are negative.      Objective:   Physical Exam  Constitutional: He is oriented to person, place, and time. He appears well-developed and well-nourished. No distress.  HENT:  Head: Normocephalic and atraumatic.  Right Ear: External ear normal.  Left Ear: External ear normal.  Nose: Nose normal.  Mouth/Throat: Oropharynx is clear and moist. No oropharyngeal exudate.  Eyes: Conjunctivae and EOM are normal. Pupils are equal, round, and reactive to light. Right eye exhibits no discharge. Left eye exhibits no discharge. No scleral icterus.  Neck: Normal range of motion. Neck supple. No JVD present. No tracheal deviation present. No thyromegaly present.  Cardiovascular: Normal rate, regular rhythm and intact distal pulses.  Exam reveals no gallop and no friction rub.   Murmur heard. Pulmonary/Chest: Effort normal and breath sounds normal. No stridor. No respiratory distress. He has no wheezes. He has no rales. He exhibits no tenderness.  Abdominal: Soft. Bowel  sounds are normal. He exhibits no distension and no mass. There is no tenderness. There is no rebound and no guarding.  Genitourinary: Rectum normal and prostate normal.  Musculoskeletal: Normal range of motion. He exhibits no edema or tenderness.  Lymphadenopathy:    He has no cervical adenopathy.  Neurological: He is alert and oriented to person, place, and time. He has normal  reflexes. He displays normal reflexes. No cranial nerve deficit. He exhibits normal muscle tone. Coordination normal.  Skin: Skin is warm. No rash noted. He is not diaphoretic. No erythema. No pallor.  Psychiatric: He has a normal mood and affect. His behavior is normal. Judgment and thought content normal.  Vitals reviewed.         Assessment & Plan:  Routine general medical examination at a health care facility  Benign essential HTN  HLD (hyperlipidemia)  Uncontrolled type 2 diabetes mellitus without complication, without long-term current use of insulin ([place])

## 2015-03-05 NOTE — Progress Notes (Signed)
Subjective:    Patient ID: Darrell [name], male    DOB: 08-05-1950, 65 y.o.   MRN: [id number]  HPI Patient is here today for complete physical exam. Pneumovax 23, Prevnar 13, the tetanus shot, and his flu shot are all up-to-date. He is due for the shingles vaccine however he would like to check on the price prior to receiving that. He is due for digital rectal exam along with a PSA. He is also due for colonoscopy as his last colonoscopy was in 2007. However the patient is battling depression. He is currently on Prozac 20 mg by mouth daily. He saw some benefit however not sufficient. He states that he is even considered suicide. The legal issues stemming from his granddaughter have resolved. However he is no longer able to see her for at least a year and he fears that he will not be able to see her ever again. Please see my last office visits for further details. He reports depression, trouble sleeping, perseverating thoughts at night to keep him awake, poor concentration, and severe fatigue. He denies any delusional  Behavior, tangential thoughts, flight of ideas, hallucinations, or homicidal ideation. Patient states that he does not have a plan and he will not act on his thoughts of suicide. He tells me he is to let me know how bad the symptoms of gotten. He also quit taking his cholesterol medication as well as his diabetes medication several months ago. He is not on his blood pressure medicine. All these medicines ran out and he did not have them refilled. This is reflected in his lab work which shows a fasting blood sugar of 160, and LDL cholesterol of 175 and his blood pressure today which is elevated. Appointment on 03/02/2015  Component Date Value Ref Range Status  . Sodium 03/02/2015 138  135 - 146 mmol/L Final  . Potassium 03/02/2015 4.0  3.5 - 5.3 mmol/L Final  . Chloride 03/02/2015 102  98 - 110 mmol/L Final  . CO2 03/02/2015 25  20 - 31 mmol/L Final  . Glucose, Bld 03/02/2015 160* 70 - 99  mg/dL Final  . BUN 03/02/2015 14  7 - 25 mg/dL Final  . Creat 03/02/2015 0.85  0.70 - 1.25 mg/dL Final  . Total Bilirubin 03/02/2015 0.5  0.2 - 1.2 mg/dL Final  . Alkaline Phosphatase 03/02/2015 51  40 - 115 U/L Final  . AST 03/02/2015 19  10 - 35 U/L Final  . ALT 03/02/2015 24  9 - 46 U/L Final  . Total Protein 03/02/2015 6.5  6.1 - 8.1 g/dL Final  . Albumin 03/02/2015 4.1  3.6 - 5.1 g/dL Final  . Calcium 03/02/2015 9.0  8.6 - 10.3 mg/dL Final  . GFR, Est African American 03/02/2015 >89  >=60 mL/min Final  . GFR, Est Non African American 03/02/2015 >89  >=60 mL/min Final   Comment:   The estimated GFR is a calculation valid for adults (>=51 years old) that uses the CKD-EPI algorithm to adjust for age and sex. It is   not to be used for children, pregnant women, hospitalized patients,    patients on dialysis, or with rapidly changing kidney function. According to the NKDEP, eGFR >89 is normal, 60-89 shows mild impairment, 30-59 shows moderate impairment, 15-29 shows severe impairment and <15 is ESRD.     . TSH 03/02/2015 1.568  0.350 - 4.500 uIU/mL Final  . Cholesterol 03/02/2015 249* 125 - 200 mg/dL Final  . Triglycerides 03/02/2015 149  <150  mg/dL Final  . HDL 03/02/2015 44  >=40 mg/dL Final  . Total CHOL/HDL Ratio 03/02/2015 5.7* <=5.0 Ratio Final  . VLDL 03/02/2015 30  <30 mg/dL Final  . LDL Cholesterol 03/02/2015 175* <130 mg/dL Final   Comment:   Total Cholesterol/HDL Ratio:CHD Risk                        Coronary Heart Disease Risk Table                                        Men       Women          1/2 Average Risk              3.4        3.3              Average Risk              5.0        4.4           2X Average Risk              9.6        7.1           3X Average Risk             23.4       11.0 Use the calculated Patient Ratio above and the CHD Risk table  to determine the patient's CHD Risk.   . WBC 03/02/2015 7.5  4.0 - 10.5 K/uL Final  . RBC 03/02/2015 4.81   4.22 - 5.81 MIL/uL Final  . Hemoglobin 03/02/2015 15.8  13.0 - 17.0 g/dL Final  . HCT 03/02/2015 45.0  39.0 - 52.0 % Final  . MCV 03/02/2015 93.6  78.0 - 100.0 fL Final  . MCH 03/02/2015 32.8  26.0 - 34.0 pg Final  . MCHC 03/02/2015 35.1  30.0 - 36.0 g/dL Final  . RDW 03/02/2015 13.6  11.5 - 15.5 % Final  . Platelets 03/02/2015 143* 150 - 400 K/uL Final  . MPV 03/02/2015 9.7  8.6 - 12.4 fL Final  . Neutrophils Relative % 03/02/2015 57  43 - 77 % Final  . Neutro Abs 03/02/2015 4.3  1.7 - 7.7 K/uL Final  . Lymphocytes Relative 03/02/2015 33  12 - 46 % Final  . Lymphs Abs 03/02/2015 2.5  0.7 - 4.0 K/uL Final  . Monocytes Relative 03/02/2015 7  3 - 12 % Final  . Monocytes Absolute 03/02/2015 0.5  0.1 - 1.0 K/uL Final  . Eosinophils Relative 03/02/2015 3  0 - 5 % Final  . Eosinophils Absolute 03/02/2015 0.2  0.0 - 0.7 K/uL Final  . Basophils Relative 03/02/2015 0  0 - 1 % Final  . Basophils Absolute 03/02/2015 0.0  0.0 - 0.1 K/uL Final  . Smear Review 03/02/2015 Criteria for review not met   Final  . PSA 03/02/2015 1.16  <=4.00 ng/mL Final   Comment: Test Methodology: ECLIA PSA (Electrochemiluminescence Immunoassay)   For PSA values from 2.5-4.0, particularly in younger men <82 years old, the AUA and NCCN suggest testing for % Free PSA (3515) and evaluation of the rate of increase in PSA (PSA velocity).    Past Medical History  Diagnosis Date  . Hyperlipidemia   . Diverticulosis     slight -  per colonoscopy, pt. asymptomatic- thus far  . GERD (gastroesophageal reflux disease)   . Insomnia   . Aortic stenosis 05/2011    Severe by recent echo; AoV area ~0.77-79 cm2, peak /mean gradient 86 mmHg/ 55 mmHg.  . S/P AVR (aortic valve replacement) 08/05/2011    Preop cath showed normal coronaries; AVR--Dr. Servando Snare, MD; 23 mm Magna Ease bioprosthetic Aortic Valve  . Hypertension     [facility]- Dr. Ellyn Hack   . Myocardial infarction ([place])     By report only, cardiac catheterization was negative  for ischemia.  . Recurrent upper respiratory infection (URI)     cold- curently- "per pt., on the mend"  . Arthritis     back- low- [facility]- workmen's comp. situation   . Depression     quick temper- per pt.   . Hiatal hernia   . Prostate disease   . History of MRSA infection   . DDD (degenerative disc disease), lumbosacral   . NIDDM (non-insulin dependent diabetes mellitus)     Type 2 NIDDM x 5 years  . History of BPH    Past Surgical History  Procedure Laterality Date  . Cardiac catheterization  06/2011     Minimal coronary disease  . Nasal septum [facility]      [facility]  . Aortic valve replacement  08/05/2011    Procedure: AORTIC VALVE REPLACEMENT (AVR);  Surgeon: [name], MD;  Location: [place];  Service: Open Heart Surgery;  Laterality: N/A;  . Lumbar fusion  04/02/2012    Dr [name]  . Anterior lumbar fusion N/A 04/01/2012    Procedure: ANTERIOR LUMBAR FUSION 1 LEVEL;  Surgeon: [name], MD;  Location: [place];  Service: Orthopedics;  Laterality: N/A;  ALIF L5-S1  . Abdominal exposure N/A 04/01/2012    Procedure: ABDOMINAL EXPOSURE;  Surgeon: Angelia Mould, MD;  Location: [facility] OR;  Service: Vascular;  Laterality: N/A;  . Nm myoview ltd  April 2013    Low risk.  . Left and right heart catheterization with coronary angiogram  07/03/2011    Procedure: LEFT AND RIGHT HEART CATHETERIZATION WITH CORONARY ANGIOGRAM;  Surgeon: [name], MD;  Location: [place] CATH [facility];  Service: Cardiovascular;;   Current Outpatient Prescriptions on File Prior to Visit  Medication Sig Dispense Refill  . ACCU-CHEK SOFTCLIX LANCETS lancets     . amLODipine (NORVASC) 10 MG tablet Take 1 tablet (10 mg total) by mouth daily. 30 tablet 5  . aspirin 325 MG tablet Take 325 mg by mouth daily.    . Blood Glucose Calibration (ACCU-CHEK AVIVA) SOLN Use to monitor FSBS 2x daily- Dx: E11.65. 3 each 3  . Blood Glucose Monitoring Suppl (ACCU-CHEK AVIVA PLUS) W/DEVICE KIT  Use to monitor FSBS 2x daily- Dx: E11.65. 1 kit 3  . co-enzyme Q-10 50 MG capsule Take 50 mg by mouth daily.    Marland Kitchen FLUoxetine (PROZAC) 20 MG tablet Take 1 tablet (20 mg total) by mouth daily. 30 tablet 5  . Garlic 9211 MG CAPS Take 1 capsule by mouth 2 (two) times daily.    Marland Kitchen glipiZIDE (GLUCOTROL XL) 10 MG 24 hr tablet TAKE 1 TABLET (10 MG TOTAL) BY MOUTH DAILY. 90 tablet 1  . glucose blood (ACCU-CHEK AVIVA) test strip Use to monitor FSBS 2x daily- Dx: E11.65. 300 each 3  . Lancet Devices (ACCU-CHEK SOFTCLIX) lancets Use to monitor FSBS 2x daily- Dx: E11.65. 3 each 3  . losartan (COZAAR) 50 MG  tablet Take 1 tablet (50 mg total) by mouth daily. 30 tablet 5  . metFORMIN (GLUCOPHAGE) 1000 MG tablet Take 1 tablet (1,000 mg total) by mouth 2 (two) times daily with a meal. 180 tablet 1  . Multiple Vitamin (MULITIVITAMIN WITH MINERALS) TABS Take 1 tablet by mouth daily.    . rosuvastatin (CRESTOR) 20 MG tablet Take 1 tablet (20 mg total) by mouth daily. *Appointment needed for additional refills* 30 tablet 0  . vitamin C (ASCORBIC ACID) 500 MG tablet Take 500 mg by mouth 2 (two) times daily.      No current facility-administered medications on file prior to visit.   Allergies  Allergen Reactions  . Tape Rash   Social History   Social History  . Marital Status: Married    Spouse Name: N/A  . Number of Children: 5  . Years of Education: N/A   Occupational History  .      [employer]   Social History Main Topics  . Smoking status: Never Smoker   . Smokeless tobacco: Never Used  . Alcohol Use: No  . Drug Use: No  . Sexual Activity: Yes   Other Topics Concern  . 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 HE  HAS 16 GRANDCHILDREN AND 5 GREAT GRAND CHILDREN . HE  EXERCISES  NOW WALKING ABOUT 5-6 TIMES A WEEK ABOUT 30 MINUTES AT A TIME AND HAS NO PROBLEM. HE DOES NOT SMOKE ,DOES NOT DRINK ALCOHOL.   He mostly  enjoys going out for walks with his grandkids.           Family History  Problem Relation Age of Onset  . Heart disease Mother   . Heart attack Brother   . Anesthesia problems Neg Hx       Review of Systems  All other systems reviewed and are negative.      Objective:   Physical Exam  Constitutional: He is oriented to person, place, and time. He appears well-developed and well-nourished. No distress.  HENT:  Head: Normocephalic and atraumatic.  Right Ear: External ear normal.  Left Ear: External ear normal.  Nose: Nose normal.  Mouth/Throat: Oropharynx is clear and moist. No oropharyngeal exudate.  Eyes: Conjunctivae and EOM are normal. Pupils are equal, round, and reactive to light. Right eye exhibits no discharge. Left eye exhibits no discharge. No scleral icterus.  Neck: Normal range of motion. Neck supple. No JVD present. No tracheal deviation present. No thyromegaly present.  Cardiovascular: Normal rate, regular rhythm and intact distal pulses.  Exam reveals no gallop and no friction rub.   Murmur heard. Pulmonary/Chest: Effort normal and breath sounds normal. No stridor. No respiratory distress. He has no wheezes. He has no rales. He exhibits no tenderness.  Abdominal: Soft. Bowel sounds are normal. He exhibits no distension and no mass. There is no tenderness. There is no rebound and no guarding.  Genitourinary: Rectum normal and prostate normal.  Musculoskeletal: Normal range of motion. He exhibits no edema or tenderness.  Lymphadenopathy:    He has no cervical adenopathy.  Neurological: He is alert and oriented to person, place, and time. He has normal reflexes. He displays normal reflexes. No cranial nerve deficit. He exhibits normal muscle tone. Coordination normal.  Skin: Skin is warm. No rash noted. He is not diaphoretic. No erythema. No pallor.  Psychiatric: His behavior is normal. Judgment and  thought content normal. His mood appears not anxious. His affect is not  angry, not blunt, not labile and not inappropriate. He exhibits a depressed mood.  Vitals reviewed.         Assessment & Plan:  Chronic fatigue - Plan: Vitamin B12, VITAMIN D 25 Hydroxy (Vit-D Deficiency, Fractures), venlafaxine XR (EFFEXOR XR) 75 MG 24 hr capsule  Depression - Plan: venlafaxine XR (EFFEXOR XR) 75 MG 24 hr capsule, Ambulatory referral to Psychiatry  Uncontrolled type 2 diabetes mellitus without complication, without long-term current use of insulin (HCC) - Plan: Hemoglobin A1c  Routine general medical examination at a health care facility - Plan: Ambulatory referral to Gastroenterology  Benign essential HTN  HLD (hyperlipidemia)  Physical exam is significant for hypertension, and hyperlipidemia. I've asked the patient to resume his blood pressure medication along with the Crestor. I would recheck lab work in 3 months.  Digital rectal exam today is normal as well as his PSA. I believe his fatigue is likely secondary to depression. His thyroid test is normal and he is not anemic. I will check a vitamin D is long with a B12 at the patient's request. I recommended discontinuing Prozac and replacing with Effexor XR 75 mg by mouth every morning for 1 week and then increasing to 150 mg by mouth every morning and recheck in one month. I will also schedule the patient to me with a psychiatrist for second opinion given the severity of his symptoms. I believe he would benefit from counseling. I will schedule the patient for colonoscopy as this is due. Immunizations and other cancer screening are up-to-date. Recheck in one month.  I will also check a hemoglobin A1c given his elevated fasting blood sugar and we may need to make changes in his medication based on this result.

## 2015-03-06 ENCOUNTER — Other Ambulatory Visit: Payer: Self-pay | Admitting: *Deleted

## 2015-03-06 ENCOUNTER — Ambulatory Visit: Payer: Medicare Other | Admitting: Family Medicine

## 2015-03-06 LAB — VITAMIN D 25 HYDROXY (VIT D DEFICIENCY, FRACTURES): Vit D, 25-Hydroxy: 18 ng/mL — ABNORMAL LOW (ref 30–100)

## 2015-03-06 MED ORDER — ROSUVASTATIN CALCIUM 20 MG PO TABS: 20.0000 mg | ORAL_TABLET | Freq: Every day | ORAL | Status: DC

## 2015-03-06 NOTE — Telephone Encounter (Signed)
Received fax requesting refill on Crestor.   Refill appropriate and filled per protocol.  

## 2015-03-07 ENCOUNTER — Telehealth: Payer: Self-pay | Admitting: Family Medicine

## 2015-03-07 MED ORDER — METFORMIN HCL 1000 MG PO TABS: 1000.0000 mg | ORAL_TABLET | Freq: Two times a day (BID) | ORAL | Status: DC

## 2015-03-07 NOTE — Telephone Encounter (Signed)
Medication called/sent to requested pharmacy  

## 2015-03-07 NOTE — Telephone Encounter (Signed)
Patient normally gets his metformin though mail order but would like to get it called into [facility] if possible  [phone]

## 2015-03-13 ENCOUNTER — Other Ambulatory Visit: Payer: Self-pay | Admitting: Family Medicine

## 2015-03-13 DIAGNOSIS — E1165 Type 2 diabetes mellitus with hyperglycemia: Principal | ICD-10-CM

## 2015-03-13 DIAGNOSIS — IMO0001 Reserved for inherently not codable concepts without codable children: Secondary | ICD-10-CM

## 2015-03-13 DIAGNOSIS — Z79899 Other long term (current) drug therapy: Secondary | ICD-10-CM

## 2015-03-13 DIAGNOSIS — E559 Vitamin D deficiency, unspecified: Secondary | ICD-10-CM | POA: Insufficient documentation

## 2015-03-13 DIAGNOSIS — E785 Hyperlipidemia, unspecified: Secondary | ICD-10-CM

## 2015-03-13 MED ORDER — VITAMIN D (ERGOCALCIFEROL) 1.25 MG (50000 UNIT) PO CAPS: 50000.0000 [IU] | ORAL_CAPSULE | ORAL | Status: DC

## 2015-03-20 ENCOUNTER — Encounter: Payer: Self-pay | Admitting: Family Medicine

## 2015-03-20 ENCOUNTER — Ambulatory Visit (INDEPENDENT_AMBULATORY_CARE_PROVIDER_SITE_OTHER): Payer: Medicare Other | Admitting: Family Medicine

## 2015-03-20 ENCOUNTER — Other Ambulatory Visit: Payer: Self-pay | Admitting: Family Medicine

## 2015-03-20 ENCOUNTER — Ambulatory Visit (HOSPITAL_COMMUNITY)
Admission: RE | Admit: 2015-03-20 | Discharge: 2015-03-20 | Disposition: A | Discharge status: Home / Self Care | Payer: Medicare Other | Source: Ambulatory Visit | Attending: Family Medicine | Admitting: Family Medicine

## 2015-03-20 VITALS — BP 132/76 | HR 76 | Temp 98.1°F | Resp 16 | Ht 69.0 in | Wt 196.0 lb

## 2015-03-20 DIAGNOSIS — N50812 Left testicular pain: Secondary | ICD-10-CM | POA: Insufficient documentation

## 2015-03-20 DIAGNOSIS — I861 Scrotal varices: Secondary | ICD-10-CM | POA: Diagnosis not present

## 2015-03-20 LAB — URINALYSIS, ROUTINE W REFLEX MICROSCOPIC
BILIRUBIN URINE: NEGATIVE
HGB URINE DIPSTICK: NEGATIVE
Leukocytes, UA: NEGATIVE
Nitrite: NEGATIVE
PH: 5.5 (ref 5.0–8.0)
Protein, ur: NEGATIVE
SPECIFIC GRAVITY, URINE: 1.02 (ref 1.001–1.035)

## 2015-03-20 MED ORDER — DOXYCYCLINE HYCLATE 100 MG PO TABS: 100.0000 mg | ORAL_TABLET | Freq: Two times a day (BID) | ORAL | Status: DC

## 2015-03-20 NOTE — Progress Notes (Signed)
Subjective:    Patient ID: Darrell [name], male    DOB: Jul 12, 1950, 65 y.o.   MRN: [id number]  HPI Patient reports 3 weeks of pain in his left testicle.  Has no dysuria or hematuria.  Reports mass in left testicle.  Pain is mild.  On exam, left testicle is firm and tender with more tenderness around the epididymis. Past Medical History  Diagnosis Date  . Hyperlipidemia   . Diverticulosis     slight -per colonoscopy, pt. asymptomatic- thus far  . GERD (gastroesophageal reflux disease)   . Insomnia   . Aortic stenosis 05/2011    Severe by recent echo; AoV area ~0.77-79 cm2, peak /mean gradient 86 mmHg/ 55 mmHg.  . S/P AVR (aortic valve replacement) 08/05/2011    Preop cath showed normal coronaries; AVR--Dr. Servando Snare, MD; 23 mm Magna Ease bioprosthetic Aortic Valve  . Hypertension     [facility]- Dr. Ellyn Hack   . Myocardial infarction ([place])     By report only, cardiac catheterization was negative for ischemia.  . Recurrent upper respiratory infection (URI)     cold- curently- "per pt., on the mend"  . Arthritis     back- low- [facility]- workmen's comp. situation   . Depression     quick temper- per pt.   . Hiatal hernia   . Prostate disease   . History of MRSA infection   . DDD (degenerative disc disease), lumbosacral   . NIDDM (non-insulin dependent diabetes mellitus)     Type 2 NIDDM x 5 years  . History of BPH    Past Surgical History  Procedure Laterality Date  . Cardiac catheterization  06/2011     Minimal coronary disease  . [facility]al septum [facility]      [facility]  . Aortic valve replacement  08/05/2011    Procedure: AORTIC VALVE REPLACEMENT (AVR);  Surgeon: [name], MD;  Location: [place];  Service: Open Heart Surgery;  Laterality: N/A;  . Lumbar fusion  04/02/2012    Dr [name]  . Anterior lumbar fusion N/A 04/01/2012    Procedure: ANTERIOR LUMBAR FUSION 1 LEVEL;  Surgeon: [name], MD;  Location: [place];  Service: Orthopedics;  Laterality:  N/A;  ALIF L5-S1  . Abdominal exposure N/A 04/01/2012    Procedure: ABDOMINAL EXPOSURE;  Surgeon: Angelia Mould, MD;  Location: [facility] OR;  Service: Vascular;  Laterality: N/A;  . Nm myoview ltd  April 2013    Low risk.  . Left and right heart catheterization with coronary angiogram  07/03/2011    Procedure: LEFT AND RIGHT HEART CATHETERIZATION WITH CORONARY ANGIOGRAM;  Surgeon: [name], MD;  Location: [place] CATH [facility];  Service: Cardiovascular;;   Current Outpatient Prescriptions on File Prior to Visit  Medication Sig Dispense Refill  . ACCU-CHEK SOFTCLIX LANCETS lancets     . amLODipine (NORVASC) 10 MG tablet Take 1 tablet (10 mg total) by mouth daily. 30 tablet 5  . aspirin 325 MG tablet Take 325 mg by mouth daily.    . Blood Glucose Calibration (ACCU-CHEK AVIVA) SOLN Use to monitor FSBS 2x daily- Dx: E11.65. 3 each 3  . Blood Glucose Monitoring Suppl (ACCU-CHEK AVIVA PLUS) W/DEVICE KIT Use to monitor FSBS 2x daily- Dx: E11.65. 1 kit 3  . co-enzyme Q-10 50 MG capsule Take 50 mg by mouth daily.    Marland Kitchen FLUoxetine (PROZAC) 20 MG tablet Take 1 tablet (20 mg total) by mouth daily. 30 tablet 5  .  Garlic 0932 MG CAPS Take 1 capsule by mouth 2 (two) times daily.    Marland Kitchen glipiZIDE (GLUCOTROL XL) 10 MG 24 hr tablet TAKE 1 TABLET (10 MG TOTAL) BY MOUTH DAILY. 90 tablet 1  . glucose blood (ACCU-CHEK AVIVA) test strip Use to monitor FSBS 2x daily- Dx: E11.65. 300 each 3  . Lancet Devices (ACCU-CHEK SOFTCLIX) lancets Use to monitor FSBS 2x daily- Dx: E11.65. 3 each 3  . losartan (COZAAR) 50 MG tablet Take 1 tablet (50 mg total) by mouth daily. 30 tablet 5  . metFORMIN (GLUCOPHAGE) 1000 MG tablet Take 1 tablet (1,000 mg total) by mouth 2 (two) times daily with a meal. 180 tablet 1  . Multiple Vitamin (MULITIVITAMIN WITH MINERALS) TABS Take 1 tablet by mouth daily.    . rosuvastatin (CRESTOR) 20 MG tablet Take 1 tablet (20 mg total) by mouth daily. 30 tablet 5  . venlafaxine XR (EFFEXOR XR) 75 MG 24 hr  capsule Take 1 capsule (75 mg total) by mouth daily with breakfast. 60 capsule 3  . vitamin C (ASCORBIC ACID) 500 MG tablet Take 500 mg by mouth 2 (two) times daily.     . Vitamin D, Ergocalciferol, (DRISDOL) 50000 units CAPS capsule Take 1 capsule (50,000 Units total) by mouth every 7 (seven) days. For 6 months 12 capsule 1   No current facility-administered medications on file prior to visit.   Allergies  Allergen Reactions  . Tape Rash   Social History   Social History  . Marital Status: Married    Spouse Name: N/A  . Number of Children: 5  . Years of Education: N/A   Occupational History  .      [employer]   Social History Main Topics  . Smoking status: Never Smoker   . Smokeless tobacco: Never Used  . Alcohol Use: No  . Drug Use: No  . Sexual Activity: Yes   Other Topics Concern  . 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 HE  HAS 16 GRANDCHILDREN AND 5 GREAT GRAND CHILDREN . HE  EXERCISES  NOW WALKING ABOUT 5-6 TIMES A WEEK ABOUT 30 MINUTES AT A TIME AND HAS NO PROBLEM. HE DOES NOT SMOKE ,DOES NOT DRINK ALCOHOL.   He mostly enjoys going out for walks with his grandkids.              Review of Systems  All other systems reviewed and are negative.      Objective:   Physical Exam  Cardiovascular: Normal rate and regular rhythm.   Pulmonary/Chest: Effort normal and breath sounds normal.  Abdominal: Hernia confirmed negative in the left inguinal area.  Genitourinary: Penis normal. Right testis shows no mass and no tenderness. Left testis shows mass and tenderness. Left testis shows no swelling.  Lymphadenopathy:       Left: No inguinal adenopathy present.  Vitals reviewed.         Assessment & Plan:  Testicular pain, left - Plan: doxycycline (VIBRA-TABS) 100 MG tablet, US Scrotum, Urinalysis, Routine w reflex microscopic (not at [facility])  Proceed with an ultrasound of the  scrotum to rule out testicular cancer. Patient also has tenderness and pain around his epididymis. I will treat the patient as possible epididymitis orchitis with doxycycline 100 mg by mouth twice a day for 10 days while awaiting the results of the ultrasound. I will also obtain a  urinalysis. Patient is not sexually active and had a negative GC chlamydia test last year and therefore does not require repeat STD screening as he has not had sex since his last STD check

## 2015-03-20 NOTE — Progress Notes (Signed)
This encounter was created in error - please disregard.

## 2015-03-22 ENCOUNTER — Ambulatory Visit: Payer: Medicare Other | Admitting: Family Medicine

## 2015-03-22 ENCOUNTER — Other Ambulatory Visit: Payer: Self-pay | Admitting: Family Medicine

## 2015-03-22 MED ORDER — LEVOFLOXACIN 500 MG PO TABS: 500.0000 mg | ORAL_TABLET | Freq: Every day | ORAL | Status: DC

## 2015-03-30 ENCOUNTER — Ambulatory Visit (INDEPENDENT_AMBULATORY_CARE_PROVIDER_SITE_OTHER): Payer: Medicare Other | Admitting: Family Medicine

## 2015-03-30 ENCOUNTER — Encounter: Payer: Self-pay | Admitting: Family Medicine

## 2015-03-30 VITALS — BP 174/86 | HR 88 | Temp 98.0°F | Resp 18 | Wt 197.0 lb

## 2015-03-30 DIAGNOSIS — N50812 Left testicular pain: Secondary | ICD-10-CM | POA: Diagnosis not present

## 2015-03-30 MED ORDER — LEVOFLOXACIN 500 MG PO TABS: 500.0000 mg | ORAL_TABLET | Freq: Every day | ORAL | Status: DC

## 2015-03-30 NOTE — Progress Notes (Signed)
Subjective:    Patient ID: Darrell [name], male    DOB: [birth year], 66 y.o.   MRN: [id number]  HPI 03/20/15 Patient reports 3 weeks of pain in his left testicle.  Has no dysuria or hematuria.  Reports mass in left testicle.  Pain is mild.  On exam, left testicle is firm and tender with more tenderness around the epididymis.  AT that time, my plan was: Proceed with an ultrasound of the scrotum to rule out testicular cancer. Patient also has tenderness and pain around his epididymis. I will treat the patient as possible epididymitis orchitis with doxycycline 100 mg by mouth twice a day for 10 days while awaiting the results of the ultrasound. I will also obtain a urinalysis. Patient is not sexually active and had a negative GC chlamydia test last year and therefore does not require repeat STD screening as he has not had sex since his last STD check  03/30/15  ultrasound confirmed epididymitis. I believe the hard area I was feeling was the very inflamed painful epididymis. The testicle itself is normal. He did have a small left-sided varicocele. His pain is  60% better. However on the  Inferior pole of the testicle near the epididymis, he is still very firm and swollen. Past Medical History  Diagnosis Date  . Hyperlipidemia   . Diverticulosis     slight -per colonoscopy, pt. asymptomatic- thus far  . GERD (gastroesophageal reflux disease)   . Insomnia   . Aortic stenosis 05/2011    Severe by recent echo; AoV area ~0.77-79 cm2, peak /mean gradient 86 mmHg/ 55 mmHg.  . S/P AVR (aortic valve replacement) 08/05/2011    Preop cath showed normal coronaries; AVR--Dr. Servando Snare, MD; 23 mm Magna Ease bioprosthetic Aortic Valve  . Hypertension     [facility]- Dr. Ellyn Hack   . Myocardial infarction ([place])     By report only, cardiac catheterization was negative for ischemia.  . Recurrent upper respiratory infection (URI)     cold- curently- "per pt., on the mend"  . Arthritis     back- low- [facility]-  workmen's comp. situation   . Depression     quick temper- per pt.   . Hiatal hernia   . Prostate disease   . History of MRSA infection   . DDD (degenerative disc disease), lumbosacral   . NIDDM (non-insulin dependent diabetes mellitus)     Type 2 NIDDM x 5 years  . History of BPH    Past Surgical History  Procedure Laterality Date  . Cardiac catheterization  06/2011     Minimal coronary disease  . Nasal septum surgery      [facility]  . Aortic valve replacement  08/05/2011    Procedure: AORTIC VALVE REPLACEMENT (AVR);  Surgeon: [name], MD;  Location: [place];  Service: Open Heart Surgery;  Laterality: N/A;  . Lumbar fusion  04/02/2012    Dr [name]  . Anterior lumbar fusion N/A 04/01/2012    Procedure: ANTERIOR LUMBAR FUSION 1 LEVEL;  Surgeon: [name], MD;  Location: [place];  Service: Orthopedics;  Laterality: N/A;  ALIF L5-S1  . Abdominal exposure N/A 04/01/2012    Procedure: ABDOMINAL EXPOSURE;  Surgeon: Angelia Mould, MD;  Location: [facility] OR;  Service: Vascular;  Laterality: N/A;  . Nm myoview ltd  April 2013    Low risk.  . Left and right heart catheterization with coronary angiogram  07/03/2011    Procedure:  LEFT AND RIGHT HEART CATHETERIZATION WITH CORONARY ANGIOGRAM;  Surgeon: [name], MD;  Location: [facility] CATH LAB;  Service: Cardiovascular;;   Current Outpatient Prescriptions on File Prior to Visit  Medication Sig Dispense Refill  . ACCU-CHEK SOFTCLIX LANCETS lancets     . amLODipine (NORVASC) 10 MG tablet Take 1 tablet (10 mg total) by mouth daily. 30 tablet 5  . aspirin 325 MG tablet Take 325 mg by mouth daily.    . Blood Glucose Calibration (ACCU-CHEK AVIVA) SOLN Use to monitor FSBS 2x daily- Dx: E11.65. 3 each 3  . Blood Glucose Monitoring Suppl (ACCU-CHEK AVIVA PLUS) W/DEVICE KIT Use to monitor FSBS 2x daily- Dx: E11.65. 1 kit 3  . co-enzyme Q-10 50 MG capsule Take 50 mg by mouth daily.    Marland Kitchen FLUoxetine (PROZAC) 20 MG tablet Take  1 tablet (20 mg total) by mouth daily. 30 tablet 5  . Garlic 9563 MG CAPS Take 1 capsule by mouth 2 (two) times daily.    Marland Kitchen glipiZIDE (GLUCOTROL XL) 10 MG 24 hr tablet TAKE 1 TABLET (10 MG TOTAL) BY MOUTH DAILY. 90 tablet 1  . glucose blood (ACCU-CHEK AVIVA) test strip Use to monitor FSBS 2x daily- Dx: E11.65. 300 each 3  . Lancet Devices (ACCU-CHEK SOFTCLIX) lancets Use to monitor FSBS 2x daily- Dx: E11.65. 3 each 3  . losartan (COZAAR) 50 MG tablet Take 1 tablet (50 mg total) by mouth daily. 30 tablet 5  . metFORMIN (GLUCOPHAGE) 1000 MG tablet Take 1 tablet (1,000 mg total) by mouth 2 (two) times daily with a meal. 180 tablet 1  . Multiple Vitamin (MULITIVITAMIN WITH MINERALS) TABS Take 1 tablet by mouth daily.    . rosuvastatin (CRESTOR) 20 MG tablet Take 1 tablet (20 mg total) by mouth daily. 30 tablet 5  . venlafaxine XR (EFFEXOR XR) 75 MG 24 hr capsule Take 1 capsule (75 mg total) by mouth daily with breakfast. 60 capsule 3  . vitamin C (ASCORBIC ACID) 500 MG tablet Take 500 mg by mouth 2 (two) times daily.     . Vitamin D, Ergocalciferol, (DRISDOL) 50000 units CAPS capsule Take 1 capsule (50,000 Units total) by mouth every 7 (seven) days. For 6 months 12 capsule 1  . doxycycline (VIBRA-TABS) 100 MG tablet Take 1 tablet (100 mg total) by mouth 2 (two) times daily. (Patient not taking: Reported on 03/30/2015) 20 tablet 0  . levofloxacin (LEVAQUIN) 500 MG tablet Take 1 tablet (500 mg total) by mouth daily. (Patient not taking: Reported on 03/30/2015) 7 tablet 0   No current facility-administered medications on file prior to visit.   Allergies  Allergen Reactions  . Tape Rash   Social History   Social History  . Marital Status: Married    Spouse Name: N/A  . Number of Children: 5  . Years of Education: N/A   Occupational History  .      [employer]   Social History Main Topics  . Smoking status: Never Smoker   . Smokeless tobacco: Never Used  . Alcohol Use: No  . Drug Use:  No  . Sexual Activity: Yes   Other Topics Concern  . 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 HE  HAS 16 GRANDCHILDREN AND 5 GREAT GRAND CHILDREN . HE  EXERCISES  NOW WALKING ABOUT 5-6 TIMES A WEEK ABOUT 30 MINUTES AT A TIME AND HAS  NO PROBLEM. HE DOES NOT SMOKE ,DOES NOT DRINK ALCOHOL.   He mostly enjoys going out for walks with his grandkids.              Review of Systems  All other systems reviewed and are negative.      Objective:   Physical Exam  Cardiovascular: Normal rate and regular rhythm.   Pulmonary/Chest: Effort normal and breath sounds normal.  Abdominal: Hernia confirmed negative in the left inguinal area.  Genitourinary: Penis normal. Right testis shows no mass and no tenderness. Left testis shows mass and tenderness. Left testis shows no swelling.  Lymphadenopathy:       Left: No inguinal adenopathy present.  Vitals reviewed.         Assessment & Plan:  Testicular pain, left - Plan: levofloxacin (LEVAQUIN) 500 MG tablet   I do not believe the patient's  Epididymitis has completely resolved. I will extend antibiotics for an additional week. He is to take Levaquin 500 mg by mouth daily for 7 additional days and I'll recheck in one week

## 2015-04-05 ENCOUNTER — Ambulatory Visit (INDEPENDENT_AMBULATORY_CARE_PROVIDER_SITE_OTHER): Payer: Medicare Other | Admitting: Family Medicine

## 2015-04-05 ENCOUNTER — Encounter: Payer: Self-pay | Admitting: Family Medicine

## 2015-04-05 VITALS — BP 110/64 | HR 78 | Temp 97.6°F | Resp 16 | Ht 69.0 in | Wt 199.0 lb

## 2015-04-05 DIAGNOSIS — N50812 Left testicular pain: Secondary | ICD-10-CM

## 2015-04-05 DIAGNOSIS — N451 Epididymitis: Secondary | ICD-10-CM | POA: Diagnosis not present

## 2015-04-05 NOTE — Progress Notes (Signed)
Subjective:    Patient ID: Darrell [name], male    DOB: 05-25-1950, 65 y.o.   MRN: [id number]  HPI 03/20/15 Patient reports 3 weeks of pain in his left testicle.  Has no dysuria or hematuria.  Reports mass in left testicle.  Pain is mild.  On exam, left testicle is firm and tender with more tenderness around the epididymis.  AT that time, my plan was: Proceed with an ultrasound of the scrotum to rule out testicular cancer. Patient also has tenderness and pain around his epididymis. I will treat the patient as possible epididymitis orchitis with doxycycline 100 mg by mouth twice a day for 10 days while awaiting the results of the ultrasound. I will also obtain a urinalysis. Patient is not sexually active and had a negative GC chlamydia test last year and therefore does not require repeat STD screening as he has not had sex since his last STD check  03/30/15  ultrasound confirmed epididymitis. I believe the hard area I was feeling was the very inflamed painful epididymis. The testicle itself is normal. He did have a small left-sided varicocele. His pain is  60% better. However on the  Inferior pole of the testicle near the epididymis, he is still very firm and swollen.  At that time, my plan was:  I do not believe the patient's  Epididymitis has completely resolved. I will extend antibiotics for an additional week. He is to take Levaquin 500 mg by mouth daily for 7 additional days and I'll recheck in one week  04/05/15 Patient states that the epididymis on his left testicle is still extremely tender to palpation. It is also still firm to palpation. He is now completed 10 days of doxycycline and 14 days of Levaquin. By my exam, the epididymis feels less swollen. There is now definition between the epididymis and the testicle which previously I could not appreciate due to the swelling. Therefore I think is clinically improving but the patient still complains of pain Past Medical History  Diagnosis Date  .  Hyperlipidemia   . Diverticulosis     slight -per colonoscopy, pt. asymptomatic- thus far  . GERD (gastroesophageal reflux disease)   . Insomnia   . Aortic stenosis 05/2011    Severe by recent echo; AoV area ~0.77-79 cm2, peak /mean gradient 86 mmHg/ 55 mmHg.  . S/P AVR (aortic valve replacement) 08/05/2011    Preop cath showed normal coronaries; AVR--Dr. Servando Snare, MD; 23 mm Magna Ease bioprosthetic Aortic Valve  . Hypertension     [facility]- Dr. Ellyn Hack   . Myocardial infarction ([place])     By report only, cardiac catheterization was negative for ischemia.  . Recurrent upper respiratory infection (URI)     cold- curently- "per pt., on the mend"  . Arthritis     back- low- [facility]- workmen's comp. situation   . Depression     quick temper- per pt.   . Hiatal hernia   . Prostate disease   . History of MRSA infection   . DDD (degenerative disc disease), lumbosacral   . NIDDM (non-insulin dependent diabetes mellitus)     Type 2 NIDDM x 5 years  . History of BPH    Past Surgical History  Procedure Laterality Date  . Cardiac catheterization  06/2011     Minimal coronary disease  . Nasal septum surgery      [facility]  . Aortic valve replacement  08/05/2011    Procedure:  AORTIC VALVE REPLACEMENT (AVR);  Surgeon: [name], MD;  Location: [place];  Service: Open Heart Surgery;  Laterality: N/A;  . Lumbar fusion  04/02/2012    Dr [name]  . Anterior lumbar fusion N/A 04/01/2012    Procedure: ANTERIOR LUMBAR FUSION 1 LEVEL;  Surgeon: [name], MD;  Location: [place];  Service: Orthopedics;  Laterality: N/A;  ALIF L5-S1  . Abdominal exposure N/A 04/01/2012    Procedure: ABDOMINAL EXPOSURE;  Surgeon: Angelia Mould, MD;  Location: [facility] OR;  Service: Vascular;  Laterality: N/A;  . Nm myoview ltd  April 2013    Low risk.  . Left and right heart catheterization with coronary angiogram  07/03/2011    Procedure: LEFT AND RIGHT HEART CATHETERIZATION WITH  CORONARY ANGIOGRAM;  Surgeon: [name], MD;  Location: [place] CATH LAB;  Service: Cardiovascular;;   Current Outpatient Prescriptions on File Prior to Visit  Medication Sig Dispense Refill  . ACCU-CHEK SOFTCLIX LANCETS lancets     . amLODipine (NORVASC) 10 MG tablet Take 1 tablet (10 mg total) by mouth daily. 30 tablet 5  . aspirin 325 MG tablet Take 325 mg by mouth daily.    . Blood Glucose Calibration (ACCU-CHEK AVIVA) SOLN Use to monitor FSBS 2x daily- Dx: E11.65. 3 each 3  . Blood Glucose Monitoring Suppl (ACCU-CHEK AVIVA PLUS) W/DEVICE KIT Use to monitor FSBS 2x daily- Dx: E11.65. 1 kit 3  . co-enzyme Q-10 50 MG capsule Take 50 mg by mouth daily.    Marland Kitchen doxycycline (VIBRA-TABS) 100 MG tablet Take 1 tablet (100 mg total) by mouth 2 (two) times daily. (Patient not taking: Reported on 03/30/2015) 20 tablet 0  . FLUoxetine (PROZAC) 20 MG tablet Take 1 tablet (20 mg total) by mouth daily. 30 tablet 5  . Garlic 2229 MG CAPS Take 1 capsule by mouth 2 (two) times daily.    Marland Kitchen glipiZIDE (GLUCOTROL XL) 10 MG 24 hr tablet TAKE 1 TABLET (10 MG TOTAL) BY MOUTH DAILY. 90 tablet 1  . glucose blood (ACCU-CHEK AVIVA) test strip Use to monitor FSBS 2x daily- Dx: E11.65. 300 each 3  . Lancet Devices (ACCU-CHEK SOFTCLIX) lancets Use to monitor FSBS 2x daily- Dx: E11.65. 3 each 3  . levofloxacin (LEVAQUIN) 500 MG tablet Take 1 tablet (500 mg total) by mouth daily. (Patient not taking: Reported on 03/30/2015) 7 tablet 0  . levofloxacin (LEVAQUIN) 500 MG tablet Take 1 tablet (500 mg total) by mouth daily. 7 tablet 0  . losartan (COZAAR) 50 MG tablet Take 1 tablet (50 mg total) by mouth daily. 30 tablet 5  . metFORMIN (GLUCOPHAGE) 1000 MG tablet Take 1 tablet (1,000 mg total) by mouth 2 (two) times daily with a meal. 180 tablet 1  . Multiple Vitamin (MULITIVITAMIN WITH MINERALS) TABS Take 1 tablet by mouth daily.    . rosuvastatin (CRESTOR) 20 MG tablet Take 1 tablet (20 mg total) by mouth daily. 30 tablet 5  .  venlafaxine XR (EFFEXOR XR) 75 MG 24 hr capsule Take 1 capsule (75 mg total) by mouth daily with breakfast. 60 capsule 3  . vitamin C (ASCORBIC ACID) 500 MG tablet Take 500 mg by mouth 2 (two) times daily.     . Vitamin D, Ergocalciferol, (DRISDOL) 50000 units CAPS capsule Take 1 capsule (50,000 Units total) by mouth every 7 (seven) days. For 6 months 12 capsule 1   No current facility-administered medications on file prior to visit.   Allergies  Allergen Reactions  .  Tape Rash   Social History   Social History  . Marital Status: Married    Spouse Name: N/A  . Number of Children: 5  . Years of Education: N/A   Occupational History  .      [employer]   Social History Main Topics  . Smoking status: Never Smoker   . Smokeless tobacco: Never Used  . Alcohol Use: No  . Drug Use: No  . Sexual Activity: Yes   Other Topics Concern  . 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 HE  HAS 16 GRANDCHILDREN AND 5 GREAT GRAND CHILDREN . HE  EXERCISES  NOW WALKING ABOUT 5-6 TIMES A WEEK ABOUT 30 MINUTES AT A TIME AND HAS NO PROBLEM. HE DOES NOT SMOKE ,DOES NOT DRINK ALCOHOL.   He mostly enjoys going out for walks with his grandkids.              Review of Systems  All other systems reviewed and are negative.      Objective:   Physical Exam  Cardiovascular: Normal rate and regular rhythm.   Pulmonary/Chest: Effort normal and breath sounds normal.  Abdominal: Hernia confirmed negative in the left inguinal area.  Genitourinary: Penis normal. Right testis shows no mass and no tenderness. Left testis shows mass and tenderness. Left testis shows no swelling.  Lymphadenopathy:       Left: No inguinal adenopathy present.  Vitals reviewed.         Assessment & Plan:  Testicular pain, left - Plan: Ambulatory referral to Urology  Epididymitis, left - Plan: Ambulatory referral to  Urology  Epididymitis has persisted. Clinically it appears better to me today. However I would like to get a second opinion from a urologist to determine if more antibiotics are necessary portable film surgical drainage is necessary which I feel is probably unlikely. Patient would appreciate a second opinion just for peace of mind. I will schedule that appointment as soon as possible.Marland Kitchen

## 2015-04-12 ENCOUNTER — Telehealth: Payer: Self-pay | Admitting: *Deleted

## 2015-04-12 NOTE — Telephone Encounter (Signed)
pt has appt scheduled for 05/11/15 at 215pm with Dr. Sandrea Matte location, lmtrc to pt for appt information

## 2015-04-12 NOTE — Telephone Encounter (Signed)
Pt called back and aware of appat

## 2015-05-11 ENCOUNTER — Ambulatory Visit (INDEPENDENT_AMBULATORY_CARE_PROVIDER_SITE_OTHER): Payer: Medicare Other | Admitting: Urology

## 2015-05-11 DIAGNOSIS — N451 Epididymitis: Secondary | ICD-10-CM

## 2015-05-11 DIAGNOSIS — N5201 Erectile dysfunction due to arterial insufficiency: Secondary | ICD-10-CM | POA: Diagnosis not present

## 2015-05-11 DIAGNOSIS — N401 Enlarged prostate with lower urinary tract symptoms: Secondary | ICD-10-CM

## 2015-05-23 ENCOUNTER — Telehealth: Payer: Self-pay | Admitting: Family Medicine

## 2015-05-23 NOTE — Telephone Encounter (Signed)
Report printed and pt aware to pu

## 2015-05-23 NOTE — Telephone Encounter (Signed)
Pt needs a copy of his lab results from his herpes test. Please call when ready to p/u [phone]

## 2015-06-18 ENCOUNTER — Ambulatory Visit (INDEPENDENT_AMBULATORY_CARE_PROVIDER_SITE_OTHER): Payer: Medicare Other | Admitting: Family Medicine

## 2015-06-18 ENCOUNTER — Encounter: Payer: Self-pay | Admitting: Family Medicine

## 2015-06-18 VITALS — BP 140/74 | HR 84 | Temp 98.3°F | Resp 18 | Ht 69.0 in | Wt 195.0 lb

## 2015-06-18 DIAGNOSIS — A46 Erysipelas: Secondary | ICD-10-CM | POA: Diagnosis not present

## 2015-06-18 MED ORDER — SULFAMETHOXAZOLE-TRIMETHOPRIM 800-160 MG PO TABS: 1.0000 | ORAL_TABLET | Freq: Two times a day (BID) | ORAL | Status: DC

## 2015-06-18 NOTE — Progress Notes (Signed)
Subjective:    Patient ID: Darrell [name], male    DOB: 08/14/1950, 65 y.o.   MRN: [id number]  HPI  The patient's nose is extremely red hot and very tender to the touch. It has been that way for 24 hours. It is swollen. He will not even let me touch his nose due to pain. With palpation, I do not appreciate any fluctuance in the nose. However it is extremely sore. Patient has a history of MRSA cellulitis including an abscess in his upper lip to the right side. Therefore I'm concerned about MRSA/erysipelas. Past Medical History  Diagnosis Date  . Hyperlipidemia   . Diverticulosis     slight -per colonoscopy, pt. asymptomatic- thus far  . GERD (gastroesophageal reflux disease)   . Insomnia   . Aortic stenosis 05/2011    Severe by recent echo; AoV area ~0.77-79 cm2, peak /mean gradient 86 mmHg/ 55 mmHg.  . S/P AVR (aortic valve replacement) 08/05/2011    Preop cath showed normal coronaries; AVR--Dr. Servando Snare, MD; 23 mm Magna Ease bioprosthetic Aortic Valve  . Hypertension     [facility]- Dr. Ellyn Hack   . Myocardial infarction ([place])     By report only, cardiac catheterization was negative for ischemia.  . Recurrent upper respiratory infection (URI)     cold- curently- "per pt., on the mend"  . Arthritis     back- low- [facility]- workmen's comp. situation   . Depression     quick temper- per pt.   . Hiatal hernia   . Prostate disease   . History of MRSA infection   . DDD (degenerative disc disease), lumbosacral   . NIDDM (non-insulin dependent diabetes mellitus)     Type 2 NIDDM x 5 years  . History of BPH    Past Surgical History  Procedure Laterality Date  . Cardiac catheterization  06/2011     Minimal coronary disease  . Nasal septum surgery      [facility]  . Aortic valve replacement  08/05/2011    Procedure: AORTIC VALVE REPLACEMENT (AVR);  Surgeon: [name], MD;  Location: [place];  Service: Open Heart Surgery;  Laterality: N/A;  . Lumbar fusion   04/02/2012    Dr [name]  . Anterior lumbar fusion N/A 04/01/2012    Procedure: ANTERIOR LUMBAR FUSION 1 LEVEL;  Surgeon: [name], MD;  Location: [place];  Service: Orthopedics;  Laterality: N/A;  ALIF L5-S1  . Abdominal exposure N/A 04/01/2012    Procedure: ABDOMINAL EXPOSURE;  Surgeon: Angelia Mould, MD;  Location: [facility] OR;  Service: Vascular;  Laterality: N/A;  . Nm myoview ltd  April 2013    Low risk.  . Left and right heart catheterization with coronary angiogram  07/03/2011    Procedure: LEFT AND RIGHT HEART CATHETERIZATION WITH CORONARY ANGIOGRAM;  Surgeon: [name], MD;  Location: [place] CATH LAB;  Service: Cardiovascular;;   Current Outpatient Prescriptions on File Prior to Visit  Medication Sig Dispense Refill  . ACCU-CHEK SOFTCLIX LANCETS lancets     . amLODipine (NORVASC) 10 MG tablet Take 1 tablet (10 mg total) by mouth daily. 30 tablet 5  . aspirin 325 MG tablet Take 325 mg by mouth daily.    . Blood Glucose Calibration (ACCU-CHEK AVIVA) SOLN Use to monitor FSBS 2x daily- Dx: E11.65. 3 each 3  . Blood Glucose Monitoring Suppl (ACCU-CHEK AVIVA PLUS) W/DEVICE KIT Use to monitor FSBS 2x daily- Dx: E11.65. 1 kit  3  . co-enzyme Q-10 50 MG capsule Take 50 mg by mouth daily.    Marland Kitchen FLUoxetine (PROZAC) 20 MG tablet Take 1 tablet (20 mg total) by mouth daily. 30 tablet 5  . Garlic 6314 MG CAPS Take 1 capsule by mouth 2 (two) times daily.    Marland Kitchen glipiZIDE (GLUCOTROL XL) 10 MG 24 hr tablet TAKE 1 TABLET (10 MG TOTAL) BY MOUTH DAILY. 90 tablet 1  . glucose blood (ACCU-CHEK AVIVA) test strip Use to monitor FSBS 2x daily- Dx: E11.65. 300 each 3  . Lancet Devices (ACCU-CHEK SOFTCLIX) lancets Use to monitor FSBS 2x daily- Dx: E11.65. 3 each 3  . losartan (COZAAR) 50 MG tablet Take 1 tablet (50 mg total) by mouth daily. 30 tablet 5  . metFORMIN (GLUCOPHAGE) 1000 MG tablet Take 1 tablet (1,000 mg total) by mouth 2 (two) times daily with a meal. 180 tablet 1  . Multiple Vitamin (MULITIVITAMIN  WITH MINERALS) TABS Take 1 tablet by mouth daily.    . rosuvastatin (CRESTOR) 20 MG tablet Take 1 tablet (20 mg total) by mouth daily. 30 tablet 5  . venlafaxine XR (EFFEXOR XR) 75 MG 24 hr capsule Take 1 capsule (75 mg total) by mouth daily with breakfast. 60 capsule 3  . vitamin C (ASCORBIC ACID) 500 MG tablet Take 500 mg by mouth 2 (two) times daily.     . Vitamin D, Ergocalciferol, (DRISDOL) 50000 units CAPS capsule Take 1 capsule (50,000 Units total) by mouth every 7 (seven) days. For 6 months 12 capsule 1   No current facility-administered medications on file prior to visit.   Allergies  Allergen Reactions  . Tape Rash   Social History   Social History  . Marital Status: Married    Spouse Name: N/A  . Number of Children: 5  . Years of Education: N/A   Occupational History  .      [employer]   Social History Main Topics  . Smoking status: Never Smoker   . Smokeless tobacco: Never Used  . Alcohol Use: No  . Drug Use: No  . Sexual Activity: Yes   Other Topics Concern  . 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 HE  HAS 16 GRANDCHILDREN AND 5 GREAT GRAND CHILDREN . HE  EXERCISES  NOW WALKING ABOUT 5-6 TIMES A WEEK ABOUT 30 MINUTES AT A TIME AND HAS NO PROBLEM. HE DOES NOT SMOKE ,DOES NOT DRINK ALCOHOL.   He mostly enjoys going out for walks with his grandkids.              Review of Systems  All other systems reviewed and are negative.      Objective:   Physical Exam  HENT:  Nose: Sinus tenderness and nasal deformity present. No septal deviation or nasal septal hematoma.  No foreign bodies.    Mouth/Throat: Oropharynx is clear and moist. No oropharyngeal exudate.  Cardiovascular: Normal rate, regular rhythm and normal heart sounds.   Pulmonary/Chest: Effort normal and breath sounds normal. No respiratory distress. He has no wheezes. He has no rales.  Skin: Skin is warm. There  is erythema.  Vitals reviewed. There is no visible abscess inside the nostril. There is no fluctuance to palpation of the nose itself        Assessment & Plan:  Patch of erysipelas - Plan: sulfamethoxazole-trimethoprim (BACTRIM DS,SEPTRA DS) 800-160 MG tablet  Begin Bactrim double strength tablets 1 by mouth twice a day and warm compresses 3 times a day. Recheck in 48 hours or sooner if worse.

## 2015-06-20 ENCOUNTER — Encounter: Payer: Self-pay | Admitting: Physician Assistant

## 2015-06-20 ENCOUNTER — Ambulatory Visit (INDEPENDENT_AMBULATORY_CARE_PROVIDER_SITE_OTHER): Payer: Medicare Other | Admitting: Physician Assistant

## 2015-06-20 VITALS — BP 168/96 | HR 92 | Temp 98.3°F | Resp 18 | Wt 187.0 lb

## 2015-06-20 DIAGNOSIS — J340 Abscess, furuncle and carbuncle of nose: Secondary | ICD-10-CM | POA: Diagnosis not present

## 2015-06-20 DIAGNOSIS — B9562 Methicillin resistant Staphylococcus aureus infection as the cause of diseases classified elsewhere: Secondary | ICD-10-CM

## 2015-06-20 DIAGNOSIS — E118 Type 2 diabetes mellitus with unspecified complications: Secondary | ICD-10-CM

## 2015-06-20 DIAGNOSIS — A46 Erysipelas: Secondary | ICD-10-CM | POA: Diagnosis not present

## 2015-06-20 DIAGNOSIS — L0390 Cellulitis, unspecified: Secondary | ICD-10-CM

## 2015-06-20 MED ORDER — CLINDAMYCIN HCL 300 MG PO CAPS: 300.0000 mg | ORAL_CAPSULE | Freq: Three times a day (TID) | ORAL | Status: DC

## 2015-06-20 MED ORDER — HYDROCODONE-ACETAMINOPHEN 5-325 MG PO TABS
1.0000 | ORAL_TABLET | Freq: Four times a day (QID) | ORAL | Status: DC | PRN
Start: 2015-06-20 — End: 2015-12-25

## 2015-06-20 NOTE — Progress Notes (Addendum)
Patient ID: Darrell [name] MRN: [id number], DOB: 09-18-1950, 65 y.o. Date of Encounter: 06/20/2015, 4:59 PM    Chief Complaint:  Chief Complaint  Patient presents with  . nose infection worse    seen Monday, in severe pain     HPI: 65 y.o. year old white male presents with above.   I reviewed his office note with Dr. Dennard Schaumann 06/18/15. That note included the following: " The patient's nose is extremely red hot and very tender to the touch. It has been that way for 24 hours. It is swollen. Won't even let me touch his nose due to pain. With palpation, I do not appreciate any fluctuance of the nose. However it is extremely sore. Patient has a history of MRSA cellulitis including abscess to his upper lip to the right side. Therefore I'm concerned about MRSA. Treated with Bactrim DS 1 by mouth twice a day and warm compresses 3 times a day with plans to recheck in 48 hours or sooner if worse."  Today patient states that he has been taking the Bactrim as directed and did start it the day of the visit 06/18/15. However he says that the area is worse. Says that it is even more red than it was.     Home Meds:   Outpatient Prescriptions Prior to Visit  Medication Sig Dispense Refill  . ACCU-CHEK SOFTCLIX LANCETS lancets     . amLODipine (NORVASC) 10 MG tablet Take 1 tablet (10 mg total) by mouth daily. 30 tablet 5  . aspirin 325 MG tablet Take 325 mg by mouth daily.    . Blood Glucose Calibration (ACCU-CHEK AVIVA) SOLN Use to monitor FSBS 2x daily- Dx: E11.65. 3 each 3  . Blood Glucose Monitoring Suppl (ACCU-CHEK AVIVA PLUS) W/DEVICE KIT Use to monitor FSBS 2x daily- Dx: E11.65. 1 kit 3  . co-enzyme Q-10 50 MG capsule Take 50 mg by mouth daily.    Marland Kitchen FLUoxetine (PROZAC) 20 MG tablet Take 1 tablet (20 mg total) by mouth daily. 30 tablet 5  . Garlic 4503 MG CAPS Take 1 capsule by mouth 2 (two) times daily.    Marland Kitchen glipiZIDE (GLUCOTROL XL) 10 MG 24 hr tablet TAKE 1 TABLET (10 MG TOTAL) BY MOUTH  DAILY. 90 tablet 1  . glucose blood (ACCU-CHEK AVIVA) test strip Use to monitor FSBS 2x daily- Dx: E11.65. 300 each 3  . Lancet Devices (ACCU-CHEK SOFTCLIX) lancets Use to monitor FSBS 2x daily- Dx: E11.65. 3 each 3  . losartan (COZAAR) 50 MG tablet Take 1 tablet (50 mg total) by mouth daily. 30 tablet 5  . metFORMIN (GLUCOPHAGE) 1000 MG tablet Take 1 tablet (1,000 mg total) by mouth 2 (two) times daily with a meal. 180 tablet 1  . Multiple Vitamin (MULITIVITAMIN WITH MINERALS) TABS Take 1 tablet by mouth daily.    . rosuvastatin (CRESTOR) 20 MG tablet Take 1 tablet (20 mg total) by mouth daily. 30 tablet 5  . sulfamethoxazole-trimethoprim (BACTRIM DS,SEPTRA DS) 800-160 MG tablet Take 1 tablet by mouth 2 (two) times daily. 20 tablet 0  . venlafaxine XR (EFFEXOR XR) 75 MG 24 hr capsule Take 1 capsule (75 mg total) by mouth daily with breakfast. 60 capsule 3  . vitamin C (ASCORBIC ACID) 500 MG tablet Take 500 mg by mouth 2 (two) times daily.     . Vitamin D, Ergocalciferol, (DRISDOL) 50000 units CAPS capsule Take 1 capsule (50,000 Units total) by mouth every 7 (seven) days. For 6 months 12  capsule 1   No facility-administered medications prior to visit.    Allergies:  Allergies  Allergen Reactions  . Tape Rash      Review of Systems: See HPI for pertinent ROS. All other ROS negative.    Physical Exam: Blood pressure 168/96, pulse 92, temperature 98.3 F (36.8 C), temperature source Oral, resp. rate 18, weight 187 lb (84.823 kg)., Body mass index is 27.6 kg/(m^2). General:  WNWD WM. Appears in no acute distress. HEENT: Normocephalic, atraumatic, eyes without discharge, sclera non-icteric Nose is purplish erythema. Septal side of right nares with crust and small area of drainage. This site was used to obtain culture. Nose still nonfluctuant with no abscess to incise.  Neck: Supple. No thyromegaly. No lymphadenopathy. Lungs: Clear bilaterally to auscultation without wheezes, rales, or  rhonchi. Breathing is unlabored. Heart: Regular rhythm. No murmurs, rubs, or gallops. Msk:  Strength and tone normal for age. Extremities/Skin: Warm and dry.  Neuro: Alert and oriented X 3. Moves all extremities spontaneously. Gait is normal. CNII-XII grossly in tact. Psych:  Responds to questions appropriately with a normal affect.     ASSESSMENT AND PLAN:  65 y.o. year old male with   Erysipelas I had Dr. Buelah Manis review his case/ review LOV note, examine pt today, and help with treatment plan. Culture obtained, sent--will f/u culture results. Reviewed that he has Diabetes, H/O MRSA--Decided to change antibiotic to Clindamycin 38m TID. Cont warm compresses. He is to have f/u OV Friday either with Dr. PDennard Schaumannor Dr. DBuelah Manisto re-assess at 48 hours. F/U sooner if needed.  Also he is having significant pain with this. He is given some hydrocodone to use for pain relief. He is aware to be careful not to take this prior to driving. - Culture, routine-abscess - clindamycin (CLEOCIN) 300 MG capsule; Take 1 capsule (300 mg total) by mouth 3 (three) times daily.  Dispense: 30 capsule; Refill: 0 - HYDROcodone-acetaminophen (NORCO/VICODIN) 5-325 MG tablet; Take 1 tablet by mouth every 6 (six) hours as needed.  Dispense: 30 tablet; Refill: 0  Type 2 diabetes mellitus with complication, without long-term current use of insulin ([facility]  H/O MRSA cellulitis    Signed, [address]DC[place] PUtah BTexoma [facility] Surgery Center4/26/2017 4:59 PM

## 2015-06-23 LAB — CULTURE, ROUTINE-ABSCESS
GRAM STAIN: NONE SEEN
Gram Stain: NONE SEEN

## 2015-08-21 ENCOUNTER — Other Ambulatory Visit: Payer: Self-pay | Admitting: Family Medicine

## 2015-08-24 ENCOUNTER — Ambulatory Visit: Payer: Self-pay | Admitting: Urology

## 2015-09-20 ENCOUNTER — Other Ambulatory Visit: Payer: Self-pay | Admitting: Family Medicine

## 2015-09-20 DIAGNOSIS — R5382 Chronic fatigue, unspecified: Secondary | ICD-10-CM

## 2015-09-20 DIAGNOSIS — F32A Depression, unspecified: Secondary | ICD-10-CM

## 2015-09-20 DIAGNOSIS — F329 Major depressive disorder, single episode, unspecified: Secondary | ICD-10-CM

## 2015-09-20 NOTE — Telephone Encounter (Signed)
Refill appropriate and filled per protocol. 

## 2015-10-01 ENCOUNTER — Ambulatory Visit: Payer: Medicare Other | Admitting: Family Medicine

## 2015-10-08 ENCOUNTER — Encounter: Payer: Self-pay | Admitting: Family Medicine

## 2015-10-08 ENCOUNTER — Ambulatory Visit (INDEPENDENT_AMBULATORY_CARE_PROVIDER_SITE_OTHER): Payer: Medicare Other | Admitting: Family Medicine

## 2015-10-08 VITALS — BP 130/82 | HR 76 | Temp 98.0°F | Resp 16 | Ht 69.0 in | Wt 190.0 lb

## 2015-10-08 DIAGNOSIS — R079 Chest pain, unspecified: Secondary | ICD-10-CM | POA: Diagnosis not present

## 2015-10-08 MED ORDER — METOPROLOL SUCCINATE ER 25 MG PO TB24: 25.0000 mg | ORAL_TABLET | Freq: Every day | ORAL | 3 refills | Status: DC

## 2015-10-08 NOTE — Progress Notes (Signed)
Subjective:    Patient ID: Darrell [name], male    DOB: 28-Sep-1950, 65 y.o.   MRN: [id number]  HPI   Has history of aortic valve replacement with a bioprosthetic valve in 2013 (Dr. Servando Snare).  Has not followed up with cardiology since 2014(Dr. Ellyn Hack).  PMH is significant for minimal cad on cath in 2013. 2 weeks ago, had 1 episode of substernal chest pain at rest radiating into both shoulders.  No SOB, diaphoresis.  Lasted 15 minutes and has not returned since.  Has been active since without chest pain or sob.  EKG show snormal sinus rhythm with q waves in ii and avf (old), and T wave inversions in inferolateral leads (present in 2014).   Past Medical History:  Diagnosis Date  . Aortic stenosis 05/2011   Severe by recent echo; AoV area ~0.77-79 cm2, peak /mean gradient 86 mmHg/ 55 mmHg.  . Arthritis    back- low- [facility]- workmen's comp. situation   . DDD (degenerative disc disease), lumbosacral   . Depression    quick temper- per pt.   . Diverticulosis    slight -per colonoscopy, pt. asymptomatic- thus far  . GERD (gastroesophageal reflux disease)   . Hiatal hernia   . History of BPH   . History of MRSA infection   . Hyperlipidemia   . Hypertension    [facility]- Dr. Ellyn Hack   . Insomnia   . Myocardial infarction ([place])    By report only, cardiac catheterization was negative for ischemia.  Marland Kitchen NIDDM (non-insulin dependent diabetes mellitus)    Type 2 NIDDM x 5 years  . Prostate disease   . Recurrent upper respiratory infection (URI)    cold- curently- "per pt., on the mend"  . S/P AVR (aortic valve replacement) 08/05/2011   Preop cath showed normal coronaries; AVR--Dr. Servando Snare, MD; 23 mm Magna Ease bioprosthetic Aortic Valve   Past Surgical History:  Procedure Laterality Date  . ABDOMINAL EXPOSURE N/A 04/01/2012   Procedure: ABDOMINAL EXPOSURE;  Surgeon: Angelia Mould, MD;  Location: [facility];  Service: Vascular;  Laterality: N/A;  . ANTERIOR LUMBAR FUSION N/A 04/01/2012     Procedure: ANTERIOR LUMBAR FUSION 1 LEVEL;  Surgeon: [name], MD;  Location: [facility];  Service: Orthopedics;  Laterality: N/A;  ALIF L5-S1  . AORTIC VALVE REPLACEMENT  08/05/2011   Procedure: AORTIC VALVE REPLACEMENT (AVR);  Surgeon: [name], MD;  Location: [facility];  Service: Open Heart Surgery;  Laterality: N/A;  . CARDIAC CATHETERIZATION  06/2011    Minimal coronary disease  . LEFT AND RIGHT HEART CATHETERIZATION WITH CORONARY ANGIOGRAM  07/03/2011   Procedure: LEFT AND RIGHT HEART CATHETERIZATION WITH CORONARY ANGIOGRAM;  Surgeon: [name], MD;  Location: [facility] CATH LAB;  Service: Cardiovascular;;  . LUMBAR FUSION  04/02/2012   Dr [name]  . NASAL SEPTUM SURGERY     [facility]- 10 yrs. ago  . NM MYOVIEW LTD  April 2013   Low risk.   Current Outpatient Prescriptions on File Prior to Visit  Medication Sig Dispense Refill  . ACCU-CHEK SOFTCLIX LANCETS lancets     . amLODipine (NORVASC) 10 MG tablet Take 1 tablet (10 mg total) by mouth daily. 30 tablet 5  . aspirin 325 MG tablet Take 325 mg by mouth daily.    . Blood Glucose Calibration (ACCU-CHEK AVIVA) SOLN Use to monitor FSBS 2x daily- Dx: E11.65. 3 each 3  . Blood Glucose Monitoring Suppl (ACCU-CHEK AVIVA PLUS) W/DEVICE KIT  Use to monitor FSBS 2x daily- Dx: E11.65. 1 kit 3  . clindamycin (CLEOCIN) 300 MG capsule Take 1 capsule (300 mg total) by mouth 3 (three) times daily. 30 capsule 0  . co-enzyme Q-10 50 MG capsule Take 50 mg by mouth daily.    Marland Kitchen FLUoxetine (PROZAC) 20 MG tablet Take 1 tablet (20 mg total) by mouth daily. 30 tablet 5  . Garlic 9169 MG CAPS Take 1 capsule by mouth 2 (two) times daily.    Marland Kitchen glipiZIDE (GLUCOTROL XL) 10 MG 24 hr tablet TAKE 1 TABLET (10 MG TOTAL) BY MOUTH DAILY. 90 tablet 1  . glipiZIDE (GLUCOTROL XL) 10 MG 24 hr tablet TAKE 1 TABLET BY MOUTH DAILY 90 tablet 1  . glucose blood (ACCU-CHEK AVIVA) test strip Use to monitor FSBS 2x daily- Dx: E11.65. 300 each 3  .  HYDROcodone-acetaminophen (NORCO/VICODIN) 5-325 MG tablet Take 1 tablet by mouth every 6 (six) hours as needed. 30 tablet 0  . Lancet Devices (ACCU-CHEK SOFTCLIX) lancets Use to monitor FSBS 2x daily- Dx: E11.65. 3 each 3  . losartan (COZAAR) 50 MG tablet Take 1 tablet (50 mg total) by mouth daily. 30 tablet 5  . metFORMIN (GLUCOPHAGE) 1000 MG tablet Take 1 tablet (1,000 mg total) by mouth 2 (two) times daily with a meal. 180 tablet 1  . Multiple Vitamin (MULITIVITAMIN WITH MINERALS) TABS Take 1 tablet by mouth daily.    . rosuvastatin (CRESTOR) 20 MG tablet Take 1 tablet (20 mg total) by mouth daily. 30 tablet 5  . venlafaxine XR (EFFEXOR-XR) 75 MG 24 hr capsule TAKE 1 CAPSULE BY MOUTH DAILY WITH BREAKFAST FOR 1 WEEK, THEN TAKE 2 CAPSULES EVERY MORNING THEREAFTER. STOP PROZAC 60 capsule 3  . vitamin C (ASCORBIC ACID) 500 MG tablet Take 500 mg by mouth 2 (two) times daily.     . Vitamin D, Ergocalciferol, (DRISDOL) 50000 units CAPS capsule Take 1 capsule (50,000 Units total) by mouth every 7 (seven) days. For 6 months 12 capsule 1   No current facility-administered medications on file prior to visit.    Allergies  Allergen Reactions  . Tape Rash   Social History   Social History  . Marital status: Married    Spouse name: N/A  . Number of children: 5  . Years of education: N/A   Occupational History  .  [employer]   Social History Main Topics  . Smoking status: Never Smoker  . Smokeless tobacco: Never Used  . Alcohol use No  . Drug use: No  . Sexual activity: Yes   Other Topics Concern  . 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 HE  HAS 16 GRANDCHILDREN AND 5 GREAT GRAND CHILDREN . HE  EXERCISES  NOW WALKING ABOUT 5-6 TIMES A WEEK ABOUT 30 MINUTES AT A TIME AND HAS NO PROBLEM. HE DOES NOT SMOKE ,DOES NOT DRINK ALCOHOL.   He mostly enjoys going out for walks with his grandkids.               Review of Systems  All other systems reviewed and are negative.      Objective:   Physical Exam  Constitutional: He appears well-developed and well-nourished.  Neck: No JVD present.  Cardiovascular: Normal rate and regular rhythm.   Murmur heard. Pulmonary/Chest: Effort normal and breath sounds normal. No respiratory distress. He  has no wheezes. He has no rales.  Abdominal: Soft. Bowel sounds are normal. He exhibits no distension. There is no tenderness. There is no rebound.  Musculoskeletal: He exhibits no edema.  Vitals reviewed.         Assessment & Plan:  Chest pain, unspecified chest pain type - Plan: EKG 12-Lead, metoprolol succinate (TOPROL-XL) 25 MG 24 hr tablet  Atypical, does not sound cardiac but I will arrange f/u with cards for possible stress test given risk factors.  Start toprol xl 25 mg poqday.

## 2015-10-24 NOTE — Progress Notes (Signed)
  Cardiology Office Note   Date:  10/24/2015   ID:  Darrell [name], DOB 06/11/1950, MRN [id number]  PCP:  PICKARD,WARREN TOM, MD  Cardiologist:   Peter Nishan, MD   No chief complaint on file.     History of Present Illness: Darrell [name] is a 64 y.o. male who presents for evaluation of chest pain Previously seen by Dr Croituro And Dr Harding.  Complained of SSCP to primary on 10/08/15  Has history of aortic valve replacement with a bioprosthetic valve in 2013 (Dr. Gerhardt).  Has not followed up with cardiology since 2014(Dr. Harding).  PMH is significant for minimal cad on cath in 2013. 2 weeks ago, had 1 episode of substernal chest pain at rest radiating into both shoulders.  No SOB, diaphoresis.  Lasted 15 minutes and has not returned since.  Has been active since without chest pain or sob.  EKG show snormal sinus rhythm with q waves in ii and avf (old), and T wave inversions in inferolateral leads (present in 2014).   Started on Toprol and referred back to cardiology  Last echo 11/09/12 reviewed mean gradient 13 mmHg peak 25 mmHg no AR   Study Conclusions  - Left ventricle: The cavity size was normal. Systolic function was normal. The estimated ejection fraction was in the range of 55% to 60%. Wall motion was normal; there were no regional wall motion abnormalities. Left ventricular diastolic function parameters were normal. - Aortic valve: A 23mmbioprosthesis was present. The prosthesis had a normal range of motion. Valve area: 1.42cm^2(VTI). Valve area: 1.26cm^2 (Vmax). - Mitral valve: Valve area by pressure half-time: 2.14cm^2. Valve area by continuity equation (using LVOT flow): 1.56cm^2. - Left atrium: The atrium was mildly dilated.     Past Medical History:  Diagnosis Date  . Aortic stenosis 05/2011   Severe by recent echo; AoV area ~0.77-79 cm2, peak /mean gradient 86 mmHg/ 55 mmHg.  . Arthritis    back- low- [facility]- workmen's comp.  situation   . DDD (degenerative disc disease), lumbosacral   . Depression    quick temper- per pt.   . Diverticulosis    slight -per colonoscopy, pt. asymptomatic- thus far  . GERD (gastroesophageal reflux disease)   . Hiatal hernia   . History of BPH   . History of MRSA infection   . Hyperlipidemia   . Hypertension    [facility]- Dr. Harding   . Insomnia   . Myocardial infarction ([facility])    By report only, cardiac catheterization was negative for ischemia.  . NIDDM (non-insulin dependent diabetes mellitus)    Type 2 NIDDM x 5 years  . Prostate disease   . Recurrent upper respiratory infection (URI)    cold- curently- "per pt., on the mend"  . S/P AVR (aortic valve replacement) 08/05/2011   Preop cath showed normal coronaries; AVR--Dr. Gerhardt, MD; 23 mm Magna Ease bioprosthetic Aortic Valve    Past Surgical History:  Procedure Laterality Date  . ABDOMINAL EXPOSURE N/A 04/01/2012   Procedure: ABDOMINAL EXPOSURE;  Surgeon: Christopher S Dickson, MD;  Location[facility];  Service: Vascular;  Laterality: N/A;  . ANTERIOR LUMBAR FUSION N/A 04/01/2012   Procedure: ANTERIOR LUMBAR FUSION 1 LEVEL;  Surgeon: Jeffrey C Beane, MD;  Location: [facility];  Service: Orthopedics;  Laterality: N/A;  ALIF L5-S1  . AORTIC VALVE REPLACEMENT  08/05/2011   Procedure: AORTIC VALVE REPLACEMENT (AVR);  Surgeon: Edward B Gerhardt, MD;  Location[facility];  Service: Open Heart Surgery;    Laterality: N/A;  . CARDIAC CATHETERIZATION  06/2011    Minimal coronary disease  . LEFT AND RIGHT HEART CATHETERIZATION WITH CORONARY ANGIOGRAM  07/03/2011   Procedure: LEFT AND RIGHT HEART CATHETERIZATION WITH CORONARY ANGIOGRAM;  Surgeon: Mihai Croitoru, MD;  Location[facility];  Service: Cardiovascular;;  . LUMBAR FUSION  04/02/2012   Dr Beane  . NASAL SEPTUM SURGERY     [facility]- 10 yrs. ago  . NM MYOVIEW LTD  April 2013   Low risk.     Current Outpatient Prescriptions  Medication Sig Dispense Refill  . ACCU-CHEK  SOFTCLIX LANCETS lancets     . amLODipine (NORVASC) 10 MG tablet Take 1 tablet (10 mg total) by mouth daily. 30 tablet 5  . aspirin 325 MG tablet Take 325 mg by mouth daily.    . Blood Glucose Calibration (ACCU-CHEK AVIVA) SOLN Use to monitor FSBS 2x daily- Dx: E11.65. 3 each 3  . Blood Glucose Monitoring Suppl (ACCU-CHEK AVIVA PLUS) W/DEVICE KIT Use to monitor FSBS 2x daily- Dx: E11.65. 1 kit 3  . clindamycin (CLEOCIN) 300 MG capsule Take 1 capsule (300 mg total) by mouth 3 (three) times daily. 30 capsule 0  . co-enzyme Q-10 50 MG capsule Take 50 mg by mouth daily.    . FLUoxetine (PROZAC) 20 MG tablet Take 1 tablet (20 mg total) by mouth daily. 30 tablet 5  . Garlic 1000 MG CAPS Take 1 capsule by mouth 2 (two) times daily.    . glipiZIDE (GLUCOTROL XL) 10 MG 24 hr tablet TAKE 1 TABLET (10 MG TOTAL) BY MOUTH DAILY. 90 tablet 1  . glipiZIDE (GLUCOTROL XL) 10 MG 24 hr tablet TAKE 1 TABLET BY MOUTH DAILY 90 tablet 1  . glucose blood (ACCU-CHEK AVIVA) test strip Use to monitor FSBS 2x daily- Dx: E11.65. 300 each 3  . HYDROcodone-acetaminophen (NORCO/VICODIN) 5-325 MG tablet Take 1 tablet by mouth every 6 (six) hours as needed. 30 tablet 0  . Lancet Devices (ACCU-CHEK SOFTCLIX) lancets Use to monitor FSBS 2x daily- Dx: E11.65. 3 each 3  . losartan (COZAAR) 50 MG tablet Take 1 tablet (50 mg total) by mouth daily. 30 tablet 5  . metFORMIN (GLUCOPHAGE) 1000 MG tablet Take 1 tablet (1,000 mg total) by mouth 2 (two) times daily with a meal. 180 tablet 1  . metoprolol succinate (TOPROL-XL) 25 MG 24 hr tablet Take 1 tablet (25 mg total) by mouth daily. 90 tablet 3  . Multiple Vitamin (MULITIVITAMIN WITH MINERALS) TABS Take 1 tablet by mouth daily.    . rosuvastatin (CRESTOR) 20 MG tablet Take 1 tablet (20 mg total) by mouth daily. 30 tablet 5  . venlafaxine XR (EFFEXOR-XR) 75 MG 24 hr capsule TAKE 1 CAPSULE BY MOUTH DAILY WITH BREAKFAST FOR 1 WEEK, THEN TAKE 2 CAPSULES EVERY MORNING THEREAFTER. STOP PROZAC  60 capsule 3  . vitamin C (ASCORBIC ACID) 500 MG tablet Take 500 mg by mouth 2 (two) times daily.     . Vitamin D, Ergocalciferol, (DRISDOL) 50000 units CAPS capsule Take 1 capsule (50,000 Units total) by mouth every 7 (seven) days. For 6 months 12 capsule 1   No current facility-administered medications for this visit.     Allergies:   Tape    Social History:  The patient  reports that he has never smoked. He has never used smokeless tobacco. He reports that he does not drink alcohol or use drugs.   Family History:  The patient's family history includes Heart   attack in his brother; Heart disease in his mother.    ROS:  Please see the history of present illness.   Otherwise, review of systems are positive for none.   All other systems are reviewed and negative.    PHYSICAL EXAM: VS:  There were no vitals taken for this visit. , BMI There is no height or weight on file to calculate BMI. Affect appropriate Healthy:  appears stated age HEENT: normal Neck supple with no adenopathy JVP normal no bruits no thyromegaly Lungs clear with no wheezing and good diaphragmatic motion Heart:  S1/S2 no murmur, no rub, gallop or click PMI normal Abdomen: benighn, BS positve, no tenderness, no AAA no bruit.  No HSM or HJR Distal pulses intact with no bruits No edema Neuro non-focal Skin warm and dry No muscular weakness    EKG:  SR rate 83 ? Old IMI Q 3 F PAC 10/08/15   Recent Labs: 03/02/2015: ALT 24; BUN 14; Creat 0.85; Hemoglobin 15.8; Platelets 143; Potassium 4.0; Sodium 138; TSH 1.568    Lipid Panel    Component Value Date/Time   CHOL 249 (H) 03/02/2015 0846   TRIG 149 03/02/2015 0846   HDL 44 03/02/2015 0846   CHOLHDL 5.7 (H) 03/02/2015 0846   VLDL 30 03/02/2015 0846   LDLCALC 175 (H) 03/02/2015 0846      Wt Readings from Last 3 Encounters:  10/08/15 190 lb (86.2 kg)  06/20/15 187 lb (84.8 kg)  06/18/15 195 lb (88.5 kg)      Other studies Reviewed: Additional  studies/ records that were reviewed today include:  Notes primary [facility] 10/08/15 Old cardiology notes [facility] And AVR op note Gerhardt .    ASSESSMENT AND PLAN:  1. Chest Pain 4 years post AVR with 40% LAD dx pre surgery ECG ? IMI f/u exercise myovue 2. AVR  No AR murmur SEM f/u echo for gradients SBE prophylaxis 3. HTN: Well controlled.  Continue current medications and low sodium Dash type diet.   4. DM: Discussed low carb diet.  Target hemoglobin A1c is 6.5 or less.  Continue current medications. 5. Chol   Had not been taking crestor for a few months back on it LDL 175 but I suspect he was  Non compliant then will recheck labs this week Discussed importance of low LDL for CAD and valve              Current medicines are reviewed at length with the patient today.  The patient does not have concerns regarding medicines.  The following changes have been made:  no change  Labs/ tests ordered today include: Ex Myovue and Echo  No orders of the defined types were placed in this encounter.    Disposition:   FU with me in a year      Signed, Peter Nishan, MD  10/24/2015 12:17 PM     [facility] [address], [place], [place]  [address] Phone: [phone]; Fax: [phone]  

## 2015-10-25 ENCOUNTER — Ambulatory Visit (INDEPENDENT_AMBULATORY_CARE_PROVIDER_SITE_OTHER): Payer: Medicare Other | Admitting: Cardiovascular Disease

## 2015-10-25 ENCOUNTER — Encounter: Payer: Self-pay | Admitting: Cardiovascular Disease

## 2015-10-25 ENCOUNTER — Encounter: Payer: Self-pay | Admitting: *Deleted

## 2015-10-25 VITALS — BP 146/78 | HR 93 | Ht 69.0 in | Wt 194.0 lb

## 2015-10-25 DIAGNOSIS — R011 Cardiac murmur, unspecified: Secondary | ICD-10-CM

## 2015-10-25 DIAGNOSIS — R072 Precordial pain: Secondary | ICD-10-CM

## 2015-10-25 DIAGNOSIS — E785 Hyperlipidemia, unspecified: Secondary | ICD-10-CM | POA: Diagnosis not present

## 2015-10-25 NOTE — Patient Instructions (Signed)
Your physician wants you to follow-up in: 1 Year with Dr. Johnsie Cancel. You will receive a reminder letter in the mail two months in advance. If you don't receive a letter, please call our office to schedule the follow-up appointment.  Your physician recommends that you continue on your current medications as directed. Please refer to the Current Medication list given to you today.  Your physician recommends that you return for lab work in: Fasting  Your physician has requested that you have an echocardiogram. Echocardiography is a painless test that uses sound waves to create images of your heart. It provides your doctor with information about the size and shape of your heart and how well your heart's chambers and valves are working. This procedure takes approximately one hour. There are no restrictions for this procedure.  Your physician has requested that you have en exercise stress myoview. For further information please visit [email or web address]. Please follow instruction sheet, as given.  If you need a refill on your cardiac medications before your next appointment, please call your pharmacy.  Thank you for choosing [facility]!

## 2015-11-08 ENCOUNTER — Ambulatory Visit (HOSPITAL_COMMUNITY)
Admission: RE | Admit: 2015-11-08 | Discharge: 2015-11-08 | Disposition: A | Discharge status: Home / Self Care | Payer: Medicare Other | Source: Ambulatory Visit | Attending: Cardiovascular Disease | Admitting: Cardiovascular Disease

## 2015-11-08 ENCOUNTER — Encounter (HOSPITAL_COMMUNITY)
Admission: RE | Admit: 2015-11-08 | Discharge: 2015-11-08 | Disposition: A | Discharge status: Home / Self Care | Payer: Medicare Other | Source: Ambulatory Visit | Attending: Cardiovascular Disease | Admitting: Cardiovascular Disease

## 2015-11-08 ENCOUNTER — Ambulatory Visit (HOSPITAL_COMMUNITY): Admission: RE | Admit: 2015-11-08 | Payer: Medicare Other | Source: Ambulatory Visit

## 2015-11-08 ENCOUNTER — Encounter (HOSPITAL_COMMUNITY): Payer: Self-pay

## 2015-11-08 DIAGNOSIS — E119 Type 2 diabetes mellitus without complications: Secondary | ICD-10-CM | POA: Insufficient documentation

## 2015-11-08 DIAGNOSIS — R011 Cardiac murmur, unspecified: Secondary | ICD-10-CM | POA: Insufficient documentation

## 2015-11-08 DIAGNOSIS — Z952 Presence of prosthetic heart valve: Secondary | ICD-10-CM | POA: Insufficient documentation

## 2015-11-08 DIAGNOSIS — E785 Hyperlipidemia, unspecified: Secondary | ICD-10-CM | POA: Insufficient documentation

## 2015-11-08 DIAGNOSIS — I10 Essential (primary) hypertension: Secondary | ICD-10-CM | POA: Insufficient documentation

## 2015-11-08 DIAGNOSIS — R072 Precordial pain: Secondary | ICD-10-CM

## 2015-11-08 DIAGNOSIS — I071 Rheumatic tricuspid insufficiency: Secondary | ICD-10-CM | POA: Diagnosis not present

## 2015-11-08 LAB — NM MYOCAR MULTI W/SPECT W/WALL MOTION / EF
CHL CUP NUCLEAR SDS: 0
CHL CUP NUCLEAR SRS: 1
CHL CUP RESTING HR STRESS: 82 {beats}/min
CSEPEW: 7 METS
CSEPPHR: 139 {beats}/min
Exercise duration (min): 6 min
Exercise duration (sec): 55 s
LHR: 0.28
LVDIAVOL: 66 mL (ref 62–150)
LVSYSVOL: 26 mL
MPHR: 153 {beats}/min
NUC STRESS TID: 1.33
Percent HR: 90 %
RPE: 15
SSS: 1

## 2015-11-08 IMAGING — NM NM MYOCAR MULTI W/SPECT W/WALL MOTION & EF
2 series · 12 of 12 positions shown · non-contrast
Comparison: none

[Series 1: rest · 8.28mm/px · 6 of 64 frames shown]
[frame 6/64]
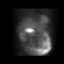
[frame 16/64]
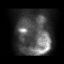
[frame 27/64]
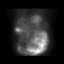
[frame 38/64]
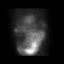
[frame 48/64]
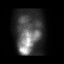
[frame 59/64]
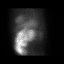

[Series 2: stress gated · 8.28mm/px · 6 of 64 frames shown]
[frame 6/64]
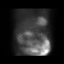
[frame 16/64]
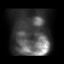
[frame 27/64]
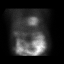
[frame 38/64]
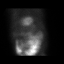
[frame 48/64]
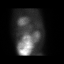
[frame 59/64]
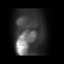

[12 of 12 positions shown; findings below may reference images not displayed]

Canned report from images found in remote index.

Refer to host system for actual result text.

## 2015-11-08 MED ORDER — TECHNETIUM TC 99M TETROFOSMIN IV KIT
10.0000 | PACK | Freq: Once | INTRAVENOUS | Status: AC | PRN
  Administered 2015-11-08: 10.8 via INTRAVENOUS

## 2015-11-08 MED ORDER — TECHNETIUM TC 99M TETROFOSMIN IV KIT
30.0000 | PACK | Freq: Once | INTRAVENOUS | Status: AC | PRN
  Administered 2015-11-08: 32.3 via INTRAVENOUS

## 2015-11-08 MED ORDER — REGADENOSON 0.4 MG/5ML IV SOLN
INTRAVENOUS | Status: AC
  Filled 2015-11-08: qty 5

## 2015-11-08 MED ORDER — SODIUM CHLORIDE 0.9% FLUSH
INTRAVENOUS | Status: AC
  Administered 2015-11-08: 10 mL via INTRAVENOUS
  Filled 2015-11-08: qty 10

## 2015-11-08 NOTE — Progress Notes (Signed)
*  PRELIMINARY RESULTS* Echocardiogram 2D Echocardiogram has been performed.  Leavy Cella 11/08/2015, 1:02 PM

## 2015-11-22 ENCOUNTER — Telehealth: Payer: Self-pay | Admitting: Family Medicine

## 2015-11-22 NOTE — Telephone Encounter (Signed)
ok 

## 2015-11-22 NOTE — Telephone Encounter (Signed)
Pt states that you had given him samples of Viagra and would like an rx called in - ok to send?

## 2015-11-23 MED ORDER — SILDENAFIL CITRATE 100 MG PO TABS
50.0000 mg | ORAL_TABLET | Freq: Every day | ORAL | 11 refills | Status: DC | PRN
Start: 2015-11-23 — End: 2018-03-05

## 2015-11-23 NOTE — Telephone Encounter (Signed)
Medication called/sent to requested pharmacy  

## 2015-12-18 ENCOUNTER — Other Ambulatory Visit: Payer: Self-pay | Admitting: Family Medicine

## 2015-12-18 MED ORDER — METFORMIN HCL 1000 MG PO TABS: 1000.0000 mg | ORAL_TABLET | Freq: Two times a day (BID) | ORAL | 0 refills | Status: DC

## 2015-12-18 MED ORDER — GLIPIZIDE ER 10 MG PO TB24: ORAL_TABLET | ORAL | 1 refills | Status: DC

## 2015-12-25 ENCOUNTER — Ambulatory Visit (INDEPENDENT_AMBULATORY_CARE_PROVIDER_SITE_OTHER): Payer: Medicare Other | Admitting: Family Medicine

## 2015-12-25 ENCOUNTER — Encounter: Payer: Self-pay | Admitting: Family Medicine

## 2015-12-25 VITALS — BP 132/88 | HR 80 | Temp 98.1°F | Resp 18 | Wt 190.0 lb

## 2015-12-25 DIAGNOSIS — E1165 Type 2 diabetes mellitus with hyperglycemia: Secondary | ICD-10-CM | POA: Diagnosis not present

## 2015-12-25 DIAGNOSIS — M545 Low back pain, unspecified: Secondary | ICD-10-CM

## 2015-12-25 DIAGNOSIS — E118 Type 2 diabetes mellitus with unspecified complications: Secondary | ICD-10-CM | POA: Diagnosis not present

## 2015-12-25 DIAGNOSIS — Z23 Encounter for immunization: Secondary | ICD-10-CM | POA: Diagnosis not present

## 2015-12-25 DIAGNOSIS — IMO0002 Reserved for concepts with insufficient information to code with codable children: Secondary | ICD-10-CM

## 2015-12-25 DIAGNOSIS — G8929 Other chronic pain: Secondary | ICD-10-CM

## 2015-12-25 LAB — CBC WITH DIFFERENTIAL/PLATELET
Basophils Absolute: 0 cells/uL (ref 0–200)
Basophils Relative: 0 %
EOS PCT: 4 %
Eosinophils Absolute: 268 cells/uL (ref 15–500)
HCT: 48.2 % (ref 38.5–50.0)
HEMOGLOBIN: 16.3 g/dL (ref 13.0–17.0)
LYMPHS ABS: 2412 {cells}/uL (ref 850–3900)
LYMPHS PCT: 36 %
MCH: 33.3 pg — AB (ref 27.0–33.0)
MCHC: 33.8 g/dL (ref 32.0–36.0)
MCV: 98.6 fL (ref 80.0–100.0)
MONOS PCT: 8 %
MPV: 10.4 fL (ref 7.5–12.5)
Monocytes Absolute: 536 cells/uL (ref 200–950)
NEUTROS PCT: 52 %
Neutro Abs: 3484 cells/uL (ref 1500–7800)
PLATELETS: 137 10*3/uL — AB (ref 140–400)
RBC: 4.89 MIL/uL (ref 4.20–5.80)
RDW: 13.4 % (ref 11.0–15.0)
WBC: 6.7 10*3/uL (ref 3.8–10.8)

## 2015-12-25 LAB — COMPLETE METABOLIC PANEL WITH GFR
ALT: 25 U/L (ref 9–46)
AST: 18 U/L (ref 10–35)
Albumin: 4.1 g/dL (ref 3.6–5.1)
Alkaline Phosphatase: 41 U/L (ref 40–115)
BUN: 15 mg/dL (ref 7–25)
CHLORIDE: 101 mmol/L (ref 98–110)
CO2: 26 mmol/L (ref 20–31)
Calcium: 9.3 mg/dL (ref 8.6–10.3)
Creat: 0.92 mg/dL (ref 0.70–1.25)
GFR, EST NON AFRICAN AMERICAN: 86 mL/min (ref >=60)
GFR, Est African American: >89 mL/min (ref >=60)
GLUCOSE: 250 mg/dL — AB (ref 70–99)
POTASSIUM: 4.1 mmol/L (ref 3.5–5.3)
SODIUM: 138 mmol/L (ref 135–146)
Total Bilirubin: 0.6 mg/dL (ref 0.2–1.2)
Total Protein: 6.4 g/dL (ref 6.1–8.1)

## 2015-12-25 LAB — LIPID PANEL
CHOLESTEROL: 197 mg/dL (ref 125–200)
HDL: 37 mg/dL — AB (ref >=40)
LDL CALC: 118 mg/dL (ref <130)
TRIGLYCERIDES: 208 mg/dL — AB (ref <150)
Total CHOL/HDL Ratio: 5.3 Ratio — ABNORMAL HIGH (ref <=5.0)
VLDL: 42 mg/dL — AB (ref <30)

## 2015-12-25 LAB — HEMOGLOBIN A1C
Hgb A1c MFr Bld: 9.2 % — ABNORMAL HIGH (ref <5.7)
Mean Plasma Glucose: 217 mg/dL

## 2015-12-25 MED ORDER — HYDROCODONE-ACETAMINOPHEN 5-325 MG PO TABS: 1.0000 | ORAL_TABLET | Freq: Four times a day (QID) | ORAL | 0 refills | Status: DC | PRN

## 2015-12-25 NOTE — Progress Notes (Signed)
Subjective:    Patient ID: Darrell [name], male    DOB: 1950/04/12, 65 y.o.   MRN: [id number]  HPI   Has history of aortic valve replacement with a bioprosthetic valve in 2013 (Dr. Servando Snare).  Has not followed up with cardiology since 2014(Dr. Ellyn Hack).  PMH is significant for minimal cad on cath in 2013. 2 weeks ago, had 1 episode of substernal chest pain at rest radiating into both shoulders.  No SOB, diaphoresis.  Lasted 15 minutes and has not returned since.  Has been active since without chest pain or sob.  EKG show snormal sinus rhythm with q waves in ii and avf (old), and T wave inversions in inferolateral leads (present in 2014). At that time, my plan was: Atypical, does not sound cardiac but I will arrange f/u with cards for possible stress test given risk factors.  Start toprol xl 25 mg poqday.  12/25/15 Cardiology evaluation was reassuring. The patient denies any further chest pain. He is here today to follow-up his diabetes. He states that his fasting blood sugars are typically between 150. He states his two-hour postprandial sugars can range anywhere from 130-220. He denies any hypoglycemia. He denies any neuropathy in his feet. He denies any shortness of breath. He denies any blurry vision.  Past Medical History:  Diagnosis Date  . Aortic stenosis 05/2011   Severe by recent echo; AoV area ~0.77-79 cm2, peak /mean gradient 86 mmHg/ 55 mmHg.  . Arthritis    back- low- [facility]- workmen's comp. situation   . DDD (degenerative disc disease), lumbosacral   . Depression    quick temper- per pt.   . Diverticulosis    slight -per colonoscopy, pt. asymptomatic- thus far  . GERD (gastroesophageal reflux disease)   . Hiatal hernia   . History of BPH   . History of MRSA infection   . Hyperlipidemia   . Hypertension    [facility]- Dr. Ellyn Hack   . Insomnia   . Myocardial infarction    By report only, cardiac catheterization was negative for ischemia.  Marland Kitchen NIDDM (non-insulin dependent  diabetes mellitus)    Type 2 NIDDM x 5 years  . Prostate disease   . Recurrent upper respiratory infection (URI)    cold- curently- "per pt., on the mend"  . S/P AVR (aortic valve replacement) 08/05/2011   Preop cath showed normal coronaries; AVR--Dr. Servando Snare, MD; 23 mm Magna Ease bioprosthetic Aortic Valve   Past Surgical History:  Procedure Laterality Date  . ABDOMINAL EXPOSURE N/A 04/01/2012   Procedure: ABDOMINAL EXPOSURE;  Surgeon: Angelia Mould, MD;  Location: [facility];  Service: Vascular;  Laterality: N/A;  . ANTERIOR LUMBAR FUSION N/A 04/01/2012   Procedure: ANTERIOR LUMBAR FUSION 1 LEVEL;  Surgeon: [name], MD;  Location: [facility];  Service: Orthopedics;  Laterality: N/A;  ALIF L5-S1  . AORTIC VALVE REPLACEMENT  08/05/2011   Procedure: AORTIC VALVE REPLACEMENT (AVR);  Surgeon: [name], MD;  Location: [facility];  Service: Open Heart Surgery;  Laterality: N/A;  . CARDIAC CATHETERIZATION  06/2011    Minimal coronary disease  . LEFT AND RIGHT HEART CATHETERIZATION WITH CORONARY ANGIOGRAM  07/03/2011   Procedure: LEFT AND RIGHT HEART CATHETERIZATION WITH CORONARY ANGIOGRAM;  Surgeon: [name], MD;  Location: [facility];  Service: Cardiovascular;;  . LUMBAR FUSION  04/02/2012   Dr [name]  . NASAL SEPTUM SURGERY     [facility] 10 yrs. ago  . NM MYOVIEW LTD  April 2013   Low risk.   Current Outpatient Prescriptions on File Prior to Visit  Medication Sig Dispense Refill  . ACCU-CHEK SOFTCLIX LANCETS lancets     . amLODipine (NORVASC) 10 MG tablet Take 1 tablet (10 mg total) by mouth daily. 30 tablet 5  . aspirin 81 MG chewable tablet Chew 81 mg by mouth daily.    . Blood Glucose Calibration (ACCU-CHEK AVIVA) SOLN Use to monitor FSBS 2x daily- Dx: E11.65. 3 each 3  . Blood Glucose Monitoring Suppl (ACCU-CHEK AVIVA PLUS) W/DEVICE KIT Use to monitor FSBS 2x daily- Dx: E11.65. 1 kit 3  . co-enzyme Q-10 50 MG capsule Take 50 mg by mouth daily.    . Garlic 9509  MG CAPS Take 1 capsule by mouth 2 (two) times daily.    Marland Kitchen glipiZIDE (GLUCOTROL XL) 10 MG 24 hr tablet TAKE 1 TABLET (10 MG TOTAL) BY MOUTH DAILY. 90 tablet 1  . glucose blood (ACCU-CHEK AVIVA) test strip Use to monitor FSBS 2x daily- Dx: E11.65. 300 each 3  . Lancet Devices (ACCU-CHEK SOFTCLIX) lancets Use to monitor FSBS 2x daily- Dx: E11.65. 3 each 3  . losartan (COZAAR) 50 MG tablet Take 1 tablet (50 mg total) by mouth daily. 30 tablet 5  . metFORMIN (GLUCOPHAGE) 1000 MG tablet Take 1 tablet (1,000 mg total) by mouth 2 (two) times daily with a meal. 180 tablet 0  . metoprolol succinate (TOPROL-XL) 25 MG 24 hr tablet Take 1 tablet (25 mg total) by mouth daily. 90 tablet 3  . Multiple Vitamin (MULITIVITAMIN WITH MINERALS) TABS Take 1 tablet by mouth daily.    . rosuvastatin (CRESTOR) 20 MG tablet Take 1 tablet (20 mg total) by mouth daily. 30 tablet 5  . venlafaxine XR (EFFEXOR-XR) 75 MG 24 hr capsule TAKE 1 CAPSULE BY MOUTH DAILY WITH BREAKFAST FOR 1 WEEK, THEN TAKE 2 CAPSULES EVERY MORNING THEREAFTER. STOP PROZAC 60 capsule 3  . vitamin C (ASCORBIC ACID) 500 MG tablet Take 500 mg by mouth 2 (two) times daily.     . Vitamin D, Ergocalciferol, (DRISDOL) 50000 units CAPS capsule Take 1 capsule (50,000 Units total) by mouth every 7 (seven) days. For 6 months 12 capsule 1  . sildenafil (VIAGRA) 100 MG tablet Take 0.5-1 tablets (50-100 mg total) by mouth daily as needed for erectile dysfunction. (Patient not taking: Reported on 12/25/2015) 10 tablet 11   No current facility-administered medications on file prior to visit.    Allergies  Allergen Reactions  . Tape Rash   Social History   Social History  . Marital status: Married    Spouse name: N/A  . Number of children: 5  . Years of education: N/A   Occupational History  .  [name]   Social History Main Topics  . Smoking status: Never Smoker  . Smokeless tobacco: Never Used  . Alcohol use No  . Drug use: No  .  Sexual activity: Yes   Other Topics Concern  . 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 HE  HAS 16 GRANDCHILDREN AND 5 GREAT GRAND CHILDREN . HE  EXERCISES  NOW WALKING ABOUT 5-6 TIMES A WEEK ABOUT 30 MINUTES AT A TIME AND HAS NO PROBLEM. HE DOES NOT SMOKE ,DOES NOT DRINK ALCOHOL.   He mostly enjoys going out for walks with his grandkids.  Review of Systems  All other systems reviewed and are negative.      Objective:   Physical Exam  Constitutional: He appears well-developed and well-nourished.  Neck: No JVD present.  Cardiovascular: Normal rate and regular rhythm.   Murmur heard. Pulmonary/Chest: Effort normal and breath sounds normal. No respiratory distress. He has no wheezes. He has no rales.  Abdominal: Soft. Bowel sounds are normal. He exhibits no distension. There is no tenderness. There is no rebound.  Musculoskeletal: He exhibits no edema.  Vitals reviewed.         Assessment & Plan:  Uncontrolled type 2 diabetes mellitus with complication, without long-term current use of insulin ([place]) - Plan: CBC with Differential/Platelet, COMPLETE METABOLIC PANEL WITH GFR, Hemoglobin A1c, Lipid panel, Microalbumin, urine  Chronic low back pain without sciatica, unspecified back pain laterality - Plan: HYDROcodone-acetaminophen (NORCO/VICODIN) 5-325 MG tablet  Need for prophylactic vaccination and inoculation against influenza - Plan: Flu Vaccine QUAD 36+ mos IM Blood sugars do not sound well controlled. His goal hemoglobin A1c will be less than 6.5. I will check his A1c today as well as a fasting lipid panel. His goal LDL cholesterol is less than 70. His goal hemoglobin A1c will be less than 6.5. If greater than 6.5 I would recommend starting jardiance due to the cardioprotective effect. Patient also received his flu shot today. He is having occasional low back pain. He is using maybe 1 or 2  Vicodin per week for low back pain particularly at night to help him sleep. Therefore I will give him 30 Vicodin. This prescription should last more than 6 months.

## 2015-12-26 LAB — MICROALBUMIN, URINE: Microalb, Ur: 0.6 mg/dL

## 2015-12-27 ENCOUNTER — Other Ambulatory Visit: Payer: Self-pay | Admitting: Family Medicine

## 2015-12-27 MED ORDER — ROSUVASTATIN CALCIUM 20 MG PO TABS: 20.0000 mg | ORAL_TABLET | Freq: Every day | ORAL | 1 refills | Status: DC

## 2015-12-27 NOTE — Telephone Encounter (Signed)
Medication refilled per protocol. 

## 2015-12-28 ENCOUNTER — Encounter: Payer: Self-pay | Admitting: Family Medicine

## 2016-01-02 ENCOUNTER — Encounter (HOSPITAL_COMMUNITY): Payer: Self-pay

## 2016-01-02 ENCOUNTER — Ambulatory Visit (INDEPENDENT_AMBULATORY_CARE_PROVIDER_SITE_OTHER): Payer: Medicare Other | Admitting: Physician Assistant

## 2016-01-02 ENCOUNTER — Ambulatory Visit (HOSPITAL_COMMUNITY)
Admission: RE | Admit: 2016-01-02 | Discharge: 2016-01-02 | Disposition: A | Discharge status: Home / Self Care | Payer: Medicare Other | Source: Ambulatory Visit | Attending: Physician Assistant | Admitting: Physician Assistant

## 2016-01-02 ENCOUNTER — Encounter: Payer: Self-pay | Admitting: Physician Assistant

## 2016-01-02 VITALS — BP 138/84 | HR 85 | Temp 98.2°F | Resp 18 | Ht 69.0 in | Wt 192.0 lb

## 2016-01-02 DIAGNOSIS — K5732 Diverticulitis of large intestine without perforation or abscess without bleeding: Secondary | ICD-10-CM

## 2016-01-02 DIAGNOSIS — R1032 Left lower quadrant pain: Secondary | ICD-10-CM

## 2016-01-02 DIAGNOSIS — K5730 Diverticulosis of large intestine without perforation or abscess without bleeding: Secondary | ICD-10-CM | POA: Diagnosis not present

## 2016-01-02 LAB — CBC
HEMATOCRIT: 45 % (ref 38.5–50.0)
Hemoglobin: 15.6 g/dL (ref 13.0–17.0)
MCH: 33.1 pg — AB (ref 27.0–33.0)
MCHC: 34.7 g/dL (ref 32.0–36.0)
MCV: 95.3 fL (ref 80.0–100.0)
PLATELETS: 122 10*3/uL — AB (ref 140–400)
RBC: 4.72 MIL/uL (ref 4.20–5.80)
RDW: 12.8 % (ref 11.0–15.0)
WBC: 9.2 10*3/uL (ref 3.8–10.8)

## 2016-01-02 LAB — COMPLETE METABOLIC PANEL WITH GFR
ALBUMIN: 4 g/dL (ref 3.6–5.1)
ALT: 16 U/L (ref 9–46)
AST: 12 U/L (ref 10–35)
Alkaline Phosphatase: 56 U/L (ref 40–115)
BILIRUBIN TOTAL: 0.7 mg/dL (ref 0.2–1.2)
BUN: 12 mg/dL (ref 7–25)
CO2: 23 mmol/L (ref 20–31)
CREATININE: 0.84 mg/dL (ref 0.70–1.25)
Calcium: 9.1 mg/dL (ref 8.6–10.3)
Chloride: 102 mmol/L (ref 98–110)
GFR, Est African American: >89 mL/min (ref >=60)
GFR, Est Non African American: >89 mL/min (ref >=60)
Glucose, Bld: 337 mg/dL — ABNORMAL HIGH (ref 70–99)
Potassium: 3.8 mmol/L (ref 3.5–5.3)
Sodium: 138 mmol/L (ref 135–146)
TOTAL PROTEIN: 6.4 g/dL (ref 6.1–8.1)

## 2016-01-02 IMAGING — CT CT ABD-PELV W/ CM
2 of 5 series · 15 of 46 positions shown, 17 images · IV contrast (APPLIED)
Comparison: [DATE]

CLINICAL DATA: Left lower quadrant abdominal pain and diarrhea for
2 weeks.

EXAM:
CT ABDOMEN AND PELVIS WITH CONTRAST
TECHNIQUE: Multidetector CT imaging of the abdomen and pelvis was performed
using the standard protocol following bolus administration of
intravenous contrast.
CONTRAST:  100mL [18] IOPAMIDOL ([18]) INJECTION 61%

[Series 2: axial st · axial · 0.83mm/px · z∈[-482,-42]mm · 12 of 100 slices shown, 14 images]
[im 6/100  soft-tissue]
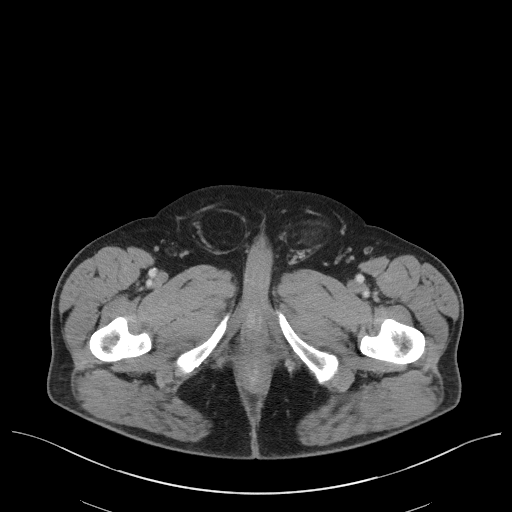
[im 6/100  bone]
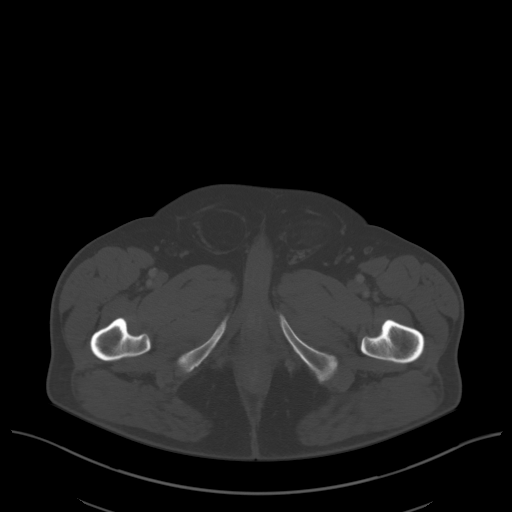
[im 17/100  soft-tissue]
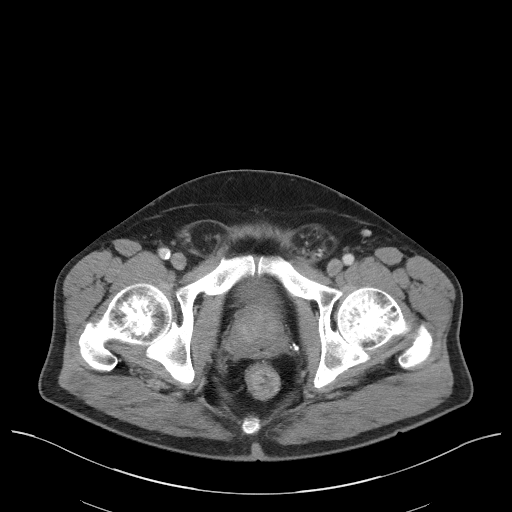
[im 23/100  soft-tissue]
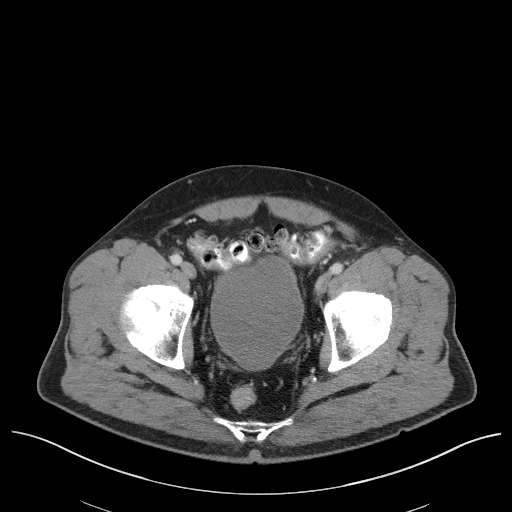
[im 28/100  soft-tissue]
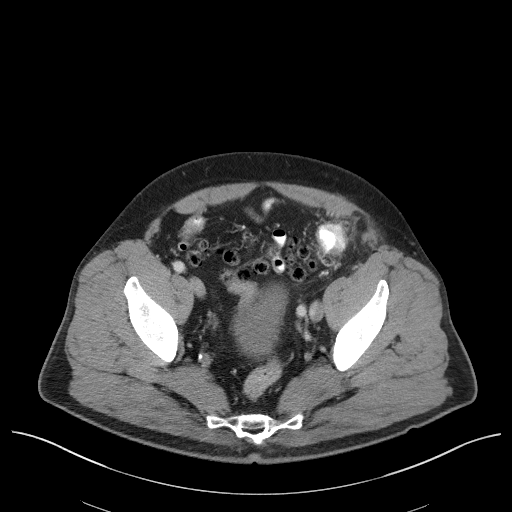
[im 39/100  soft-tissue]
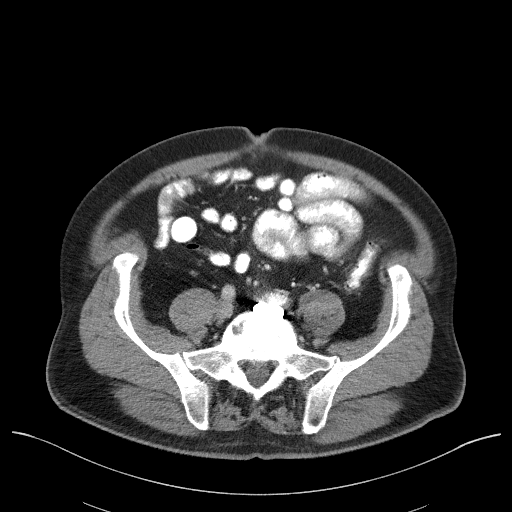
[im 45/100  soft-tissue]
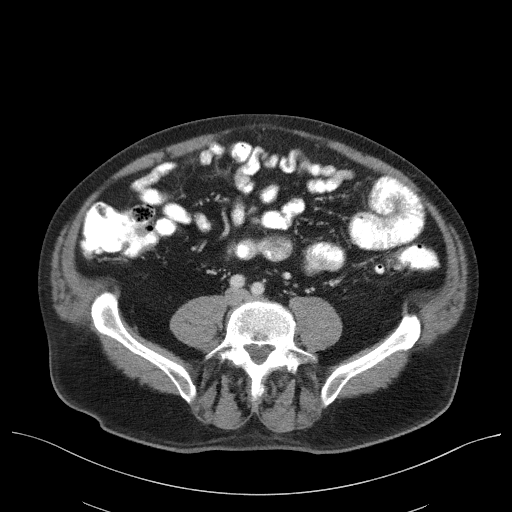
[im 56/100  soft-tissue]
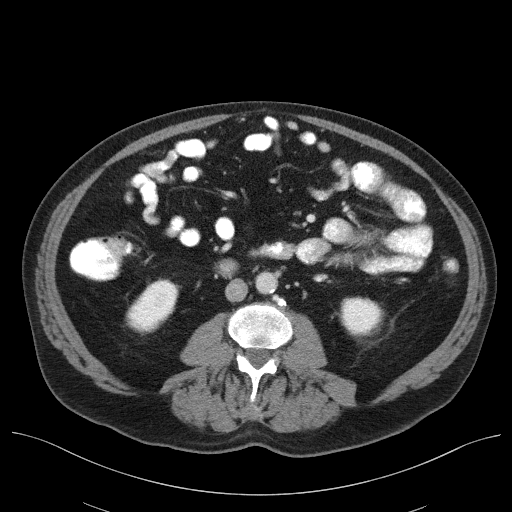
[im 61/100  soft-tissue]
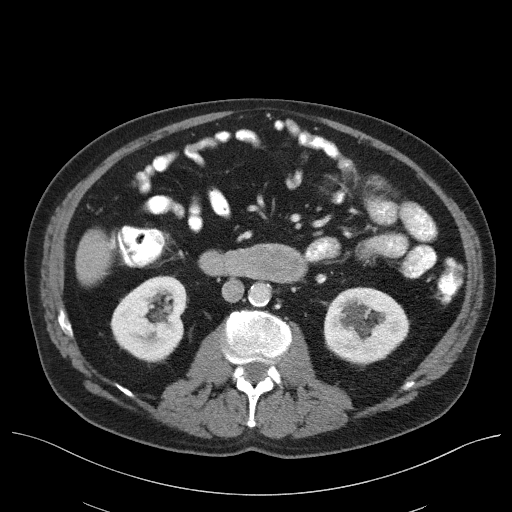
[im 72/100  soft-tissue]
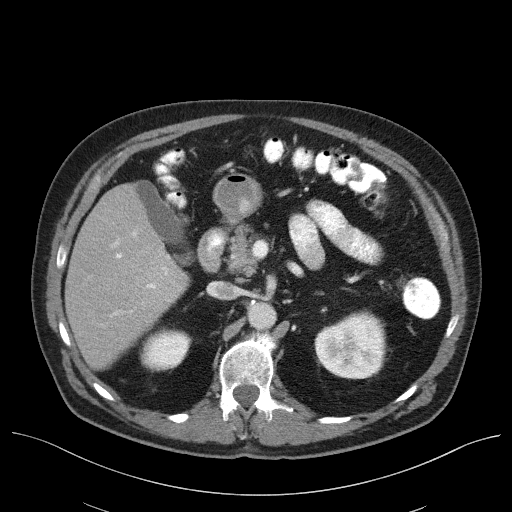
[im 72/100  bone]
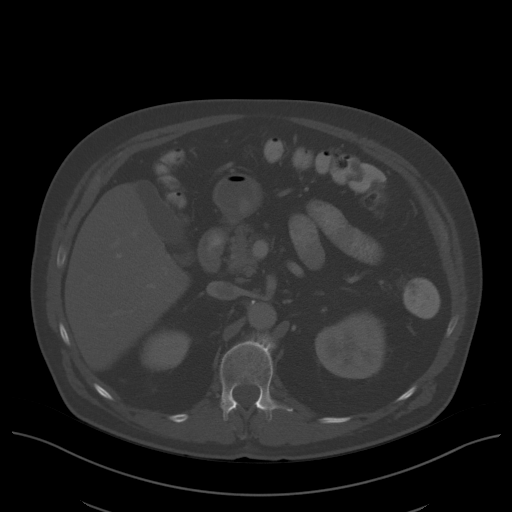
[im 78/100  soft-tissue]
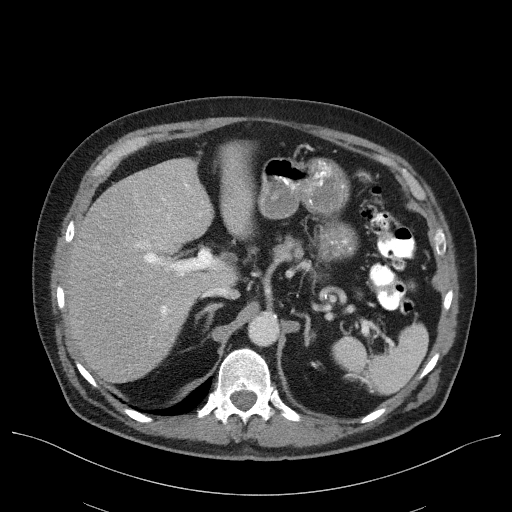
[im 83/100  soft-tissue]
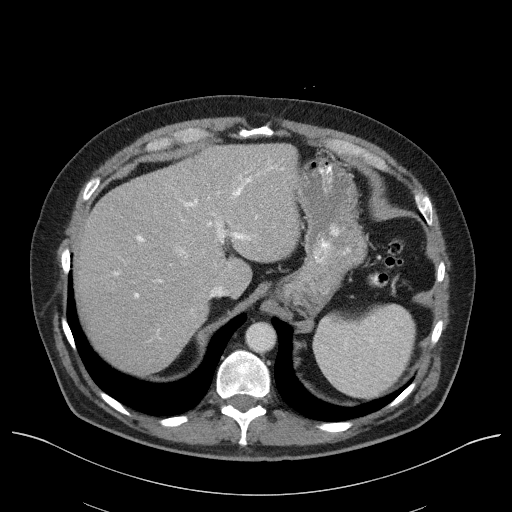
[im 94/100  soft-tissue]
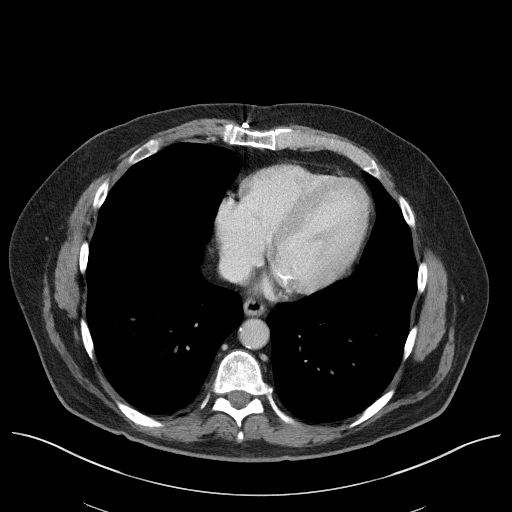

[Series 4: coronal st · coronal · 0.76mm/px · 3 of 101 slices shown]
[im 34/101  soft-tissue]
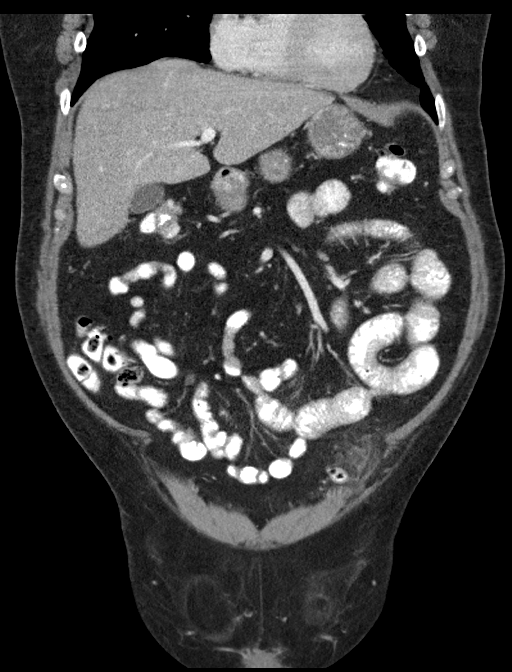
[im 45/101  soft-tissue]
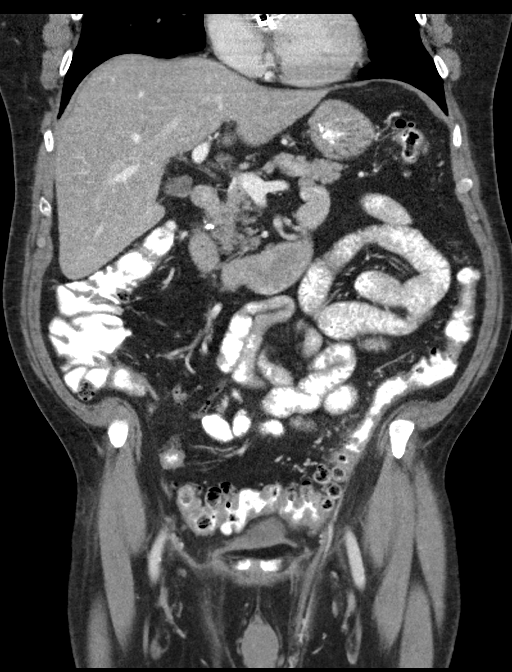
[im 56/101  soft-tissue]
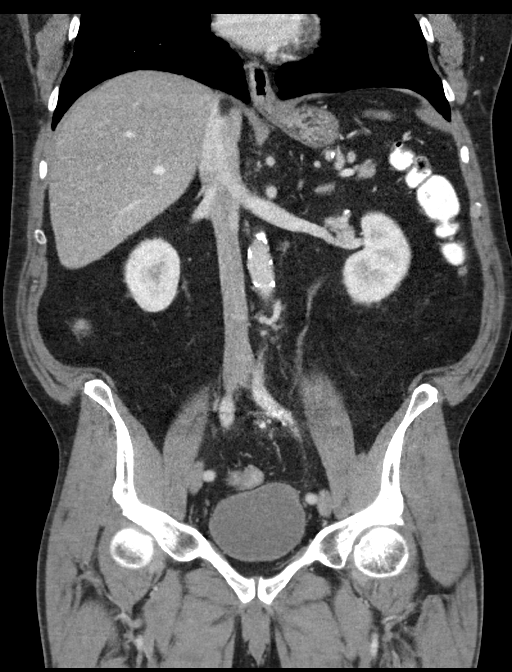

[15 of 46 positions shown; findings below may reference images not displayed]

FINDINGS: Lower chest: The lung bases are clear of acute process. No pleural
effusion or pulmonary lesions. The heart is normal in size. No
pericardial effusion. Surgical changes from aortic valve replacement
surgery are noted. Coronary artery calcifications are noted. The
distal esophagus and aorta are unremarkable.

Hepatobiliary: Mild diffuse fatty infiltration of the liver but no
focal hepatic lesions other than a small cyst in segment 4B. The
gallbladder is normal. No common bile duct dilatation.

Pancreas: No mass, inflammation or ductal dilatation.

Spleen: Normal size.  No focal lesions.

Adrenals/Urinary Tract: The adrenal glands are normal in stable.

Bilateral parapelvic renal cyst. No worrisome renal lesions or
hydronephrosis. No obstructing ureteral calculi or bladder calculi.
No renal or bladder mass.

Bilateral renal artery calcifications. There is a 15 mm rim
calcified aneurysm on the left.

Stomach/Bowel: The stomach, duodenum, and small bowel are
unremarkable. The terminal ileum is normal. The appendix is normal.

Severe sigmoid diverticulosis. There is acute uncomplicated
diverticulitis at the sigmoid colon descending colon junction
region. No abscess or free air.

Vascular/Lymphatic: Scattered atherosclerotic calcifications
involving the aorta. The branch vessels are patent. The major venous
structures are patent. Small scattered mesenteric and
retroperitoneal lymph nodes but no mass or overt adenopathy.

Reproductive: The prostate gland is mildly enlarged. The seminal
vesicles are normal. Bladder is unremarkable.

Other: No pelvic mass or adenopathy. No free pelvic fluid
collections.

Moderate to large bilateral inguinal hernias containing fat.

Musculoskeletal: No acute bony findings. Anterior and interbody
fusion changes noted at L5-S1.
IMPRESSION: Acute uncomplicated diverticulosis at the descending colon sigmoid
colon junction region.

Severe sigmoid diverticulosis.

## 2016-01-02 MED ORDER — METRONIDAZOLE 500 MG PO TABS: 500.0000 mg | ORAL_TABLET | Freq: Three times a day (TID) | ORAL | 0 refills | Status: DC

## 2016-01-02 MED ORDER — IOPAMIDOL (ISOVUE-300) INJECTION 61%
100.0000 mL | Freq: Once | INTRAVENOUS | Status: AC | PRN
  Administered 2016-01-02: 100 mL via INTRAVENOUS

## 2016-01-02 MED ORDER — CIPROFLOXACIN HCL 500 MG PO TABS: 500.0000 mg | ORAL_TABLET | Freq: Two times a day (BID) | ORAL | 0 refills | Status: DC

## 2016-01-02 NOTE — Addendum Note (Signed)
Addended by: Dena Billet on: 01/02/2016 03:08 PM   Modules accepted: Orders

## 2016-01-02 NOTE — Progress Notes (Addendum)
Patient ID: Darrell [name] MRN: [id number], DOB: 1950-11-09, 65 y.o. Date of Encounter: @DATE @  Chief Complaint:  Chief Complaint  Patient presents with  . Abdominal Pain    x3days    HPI: 65 y.o. year old male  presents with above.   Says that his left lower abdomen "right at the belt line" has been feeling sore. Says that if he bends over or coughs it gets worse. Says that he can fall asleep at night but then when he turns over to change positions, it hurts.  Asked if he had any diarrhea and his response is "a lot----for about 1-1/2 weeks now". Says that he is having 4-5 episodes per day. Says that usually, prior to 1-1/2 weeks ago, his stools were normal and was not having diarrhea.  Says if he sits perfectly still he feels no pain in the left lower quadrant. Says that if he moves just slightly it's about a 1/10 but then if he bends over or twist or coughs the pain increases.  He has had no fevers or chills.  Asked if he has ever had diverticulitis. He says that when he had his colonoscopy he thinks they mentioned something about this--- sounds like he may of been told he had diverticulosis----but he has never had diverticulitis.  No other complaints or concerns.   Past Medical History:  Diagnosis Date  . Aortic stenosis 05/2011   Severe by recent echo; AoV area ~0.77-79 cm2, peak /mean gradient 86 mmHg/ 55 mmHg.  . Arthritis    back- low- [facility]- workmen's comp. situation   . DDD (degenerative disc disease), lumbosacral   . Depression    quick temper- per pt.   . Diverticulosis    slight -per colonoscopy, pt. asymptomatic- thus far  . GERD (gastroesophageal reflux disease)   . Hiatal hernia   . History of BPH   . History of MRSA infection   . Hyperlipidemia   . Hypertension    [facility]- Dr. Ellyn Hack   . Insomnia   . Myocardial infarction    By report only, cardiac catheterization was negative for ischemia.  Marland Kitchen NIDDM (non-insulin dependent diabetes mellitus)      Type 2 NIDDM x 5 years  . Prostate disease   . Recurrent upper respiratory infection (URI)    cold- curently- "per pt., on the mend"  . S/P AVR (aortic valve replacement) 08/05/2011   Preop cath showed normal coronaries; AVR--Dr. Servando Snare, MD; 23 mm Magna Ease bioprosthetic Aortic Valve     Home Meds: Outpatient Medications Prior to Visit  Medication Sig Dispense Refill  . ACCU-CHEK SOFTCLIX LANCETS lancets     . amLODipine (NORVASC) 10 MG tablet Take 1 tablet (10 mg total) by mouth daily. 30 tablet 5  . aspirin 81 MG chewable tablet Chew 81 mg by mouth daily.    . Blood Glucose Calibration (ACCU-CHEK AVIVA) SOLN Use to monitor FSBS 2x daily- Dx: E11.65. 3 each 3  . Blood Glucose Monitoring Suppl (ACCU-CHEK AVIVA PLUS) W/DEVICE KIT Use to monitor FSBS 2x daily- Dx: E11.65. 1 kit 3  . co-enzyme Q-10 50 MG capsule Take 50 mg by mouth daily.    . Garlic 8453 MG CAPS Take 1 capsule by mouth 2 (two) times daily.    Marland Kitchen glipiZIDE (GLUCOTROL XL) 10 MG 24 hr tablet TAKE 1 TABLET (10 MG TOTAL) BY MOUTH DAILY. 90 tablet 1  . glucose blood (ACCU-CHEK AVIVA) test strip Use to monitor FSBS 2x daily- Dx: E11.65.  300 each 3  . Lancet Devices (ACCU-CHEK SOFTCLIX) lancets Use to monitor FSBS 2x daily- Dx: E11.65. 3 each 3  . losartan (COZAAR) 50 MG tablet Take 1 tablet (50 mg total) by mouth daily. 30 tablet 5  . metFORMIN (GLUCOPHAGE) 1000 MG tablet Take 1 tablet (1,000 mg total) by mouth 2 (two) times daily with a meal. 180 tablet 0  . metoprolol succinate (TOPROL-XL) 25 MG 24 hr tablet Take 1 tablet (25 mg total) by mouth daily. 90 tablet 3  . Multiple Vitamin (MULITIVITAMIN WITH MINERALS) TABS Take 1 tablet by mouth daily.    . rosuvastatin (CRESTOR) 20 MG tablet Take 1 tablet (20 mg total) by mouth daily. 90 tablet 1  . sildenafil (VIAGRA) 100 MG tablet Take 0.5-1 tablets (50-100 mg total) by mouth daily as needed for erectile dysfunction. 10 tablet 11  . venlafaxine XR (EFFEXOR-XR) 75 MG 24 hr  capsule TAKE 1 CAPSULE BY MOUTH DAILY WITH BREAKFAST FOR 1 WEEK, THEN TAKE 2 CAPSULES EVERY MORNING THEREAFTER. STOP PROZAC 60 capsule 3  . vitamin C (ASCORBIC ACID) 500 MG tablet Take 500 mg by mouth 2 (two) times daily.     . Vitamin D, Ergocalciferol, (DRISDOL) 50000 units CAPS capsule Take 1 capsule (50,000 Units total) by mouth every 7 (seven) days. For 6 months 12 capsule 1  . HYDROcodone-acetaminophen (NORCO/VICODIN) 5-325 MG tablet Take 1 tablet by mouth every 6 (six) hours as needed. (Patient not taking: Reported on 01/02/2016) 30 tablet 0   No facility-administered medications prior to visit.     Allergies:  Allergies  Allergen Reactions  . Tape Rash    Social History   Social History  . Marital status: Married    Spouse name: N/A  . Number of children: 5  . Years of education: N/A   Occupational History  .  [employer]   Social History Main Topics  . Smoking status: Never Smoker  . Smokeless tobacco: Never Used  . Alcohol use No  . Drug use: No  . Sexual activity: Yes   Other Topics Concern  . 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 HE  HAS 16 GRANDCHILDREN AND 5 GREAT GRAND CHILDREN . HE  EXERCISES  NOW WALKING ABOUT 5-6 TIMES A WEEK ABOUT 30 MINUTES AT A TIME AND HAS NO PROBLEM. HE DOES NOT SMOKE ,DOES NOT DRINK ALCOHOL.   He mostly enjoys going out for walks with his grandkids.            Family History  Problem Relation Age of Onset  . Heart disease Mother   . Heart attack Brother   . Anesthesia problems Neg Hx      Review of Systems:  See HPI for pertinent ROS. All other ROS negative.    Physical Exam: Blood pressure 138/84, pulse 85, temperature 98.2 F (36.8 C), temperature source Oral, resp. rate 18, height 5' 9"  (1.753 m), weight 192 lb (87.1 kg), SpO2 98 %., Body mass index is 28.35 kg/m. General: WNWD WM. Appears in no acute distress. Neck:  Supple. No thyromegaly. No lymphadenopathy. Lungs: Clear bilaterally to auscultation without wheezes, rales, or rhonchi. Breathing is unlabored. Heart: RRR with S1 S2. No murmurs, rubs, or gallops. Abdomen: Soft,  non-distended with normoactive bowel sounds. No hepatomegaly. No rebound/guarding. No obvious abdominal masses. Tender with palpation left lower quadrant. No other  areas of tenderness with palpation. Musculoskeletal:  Strength and tone normal for age. Extremities/Skin: Warm and dry.  Neuro: Alert and oriented X 3. Moves all extremities spontaneously. Gait is normal. CNII-XII grossly in tact. Psych:  Responds to questions appropriately with a normal affect.   Results for orders placed or performed in visit on 01/02/16  CBC  Result Value Ref Range   WBC 9.2 3.8 - 10.8 K/uL   RBC 4.72 4.20 - 5.80 MIL/uL   Hemoglobin 15.6 13.0 - 17.0 g/dL   HCT 45.0 38.5 - 50.0 %   MCV 95.3 80.0 - 100.0 fL   MCH 33.1 (H) 27.0 - 33.0 pg   MCHC 34.7 32.0 - 36.0 g/dL   RDW 12.8 11.0 - 15.0 %   Platelets 122 (L) 140 - 400 K/uL     ASSESSMENT AND PLAN:  65 y.o. year old male with  1. Abdominal pain, left lower quadrant  CBC is run here in the office palpation was here. White count normal. Suspect diverticulitis but will obtain CT scan. Pt prefers to have this done in [place] so this is scheduled at [facility] and he is sent directly to [facility] radiology to have this done now. I will follow up with him when I get the CT results later today. Will treat accordingly at that time. - CBC - COMPLETE METABOLIC PANEL WITH GFR - CT Abdomen Pelvis W Contrast; Future  ADDENDUM ADDED AT 3:01 pm 01/02/2016: [facility] just called informing me that they had completed the CT and the patient was there to discuss with me. Informed him that CT does show diverticulitis as we had suspected at his office visit this morning. I have told him to stay with a clear liquid diet for several days----chicken  noodle soup, Jell-O, liquids such as ginger ale. After that can gradually advance to bland diet. Told him that I will send in 2 medications to his pharmacy and for him to start these medications immediately, take these as directed, and complete all of them. Treat with Cipro 500 mg twice a day and Flagyl 500 mg 3 times a day---for 10 days. He voices understanding and agrees. Follow-up if symptoms worsen significantly or if symptoms are not resolved at completion of treatment in 10 days.    2. Diverticulitis of colon - ciprofloxacin (CIPRO) 500 MG tablet; Take 1 tablet (500 mg total) by mouth 2 (two) times daily.  Dispense: 20 tablet; Refill: 0 - metroNIDAZOLE (FLAGYL) 500 MG tablet; Take 1 tablet (500 mg total) by mouth 3 (three) times daily.  Dispense: 30 tablet; Refill: 0   Signed, [address] [place], [place], [facility] 01/02/2016 1:01 PM

## 2016-01-03 ENCOUNTER — Telehealth: Payer: Self-pay | Admitting: Family Medicine

## 2016-01-03 ENCOUNTER — Other Ambulatory Visit: Payer: Self-pay | Admitting: Family Medicine

## 2016-01-03 DIAGNOSIS — M545 Low back pain: Principal | ICD-10-CM

## 2016-01-03 DIAGNOSIS — G8929 Other chronic pain: Secondary | ICD-10-CM

## 2016-01-03 MED ORDER — EMPAGLIFLOZIN-LINAGLIPTIN 25-5 MG PO TABS: 1.0000 | ORAL_TABLET | Freq: Every day | ORAL | 3 refills | Status: DC

## 2016-01-03 NOTE — Telephone Encounter (Signed)
PA submitted through [email or web address]  

## 2016-01-10 NOTE — Telephone Encounter (Signed)
PA was denied - ins did send alternative medications - left on [facility] desk for review.

## 2016-01-14 NOTE — Telephone Encounter (Signed)
Per Dr. Dennard Schaumann needs to switch to Jardience 25mg  qd and Tradjenta 5mg  qd.  Tried to call pt [facility]

## 2016-01-15 MED ORDER — EMPAGLIFLOZIN 25 MG PO TABS: 25.0000 mg | ORAL_TABLET | Freq: Every day | ORAL | 3 refills | Status: DC

## 2016-01-15 MED ORDER — LINAGLIPTIN 5 MG PO TABS: 5.0000 mg | ORAL_TABLET | Freq: Every day | ORAL | 3 refills | Status: DC

## 2016-01-15 NOTE — Telephone Encounter (Signed)
Patient aware of providers recommendations and meds sent to pharm.  Pt states that he lost his RX for hydrocodone and was needing another RX - ok to do?

## 2016-01-16 MED ORDER — HYDROCODONE-ACETAMINOPHEN 5-325 MG PO TABS: 1.0000 | ORAL_TABLET | Freq: Four times a day (QID) | ORAL | 0 refills | Status: DC | PRN

## 2016-01-16 NOTE — Telephone Encounter (Signed)
RX printed, left up front and patient aware to pick up  

## 2016-01-16 NOTE — Telephone Encounter (Signed)
ok 

## 2018-02-09 ENCOUNTER — Telehealth: Payer: Self-pay | Admitting: Family Medicine

## 2018-02-09 DIAGNOSIS — F32A Depression, unspecified: Secondary | ICD-10-CM

## 2018-02-09 DIAGNOSIS — R5382 Chronic fatigue, unspecified: Secondary | ICD-10-CM

## 2018-02-09 DIAGNOSIS — F329 Major depressive disorder, single episode, unspecified: Secondary | ICD-10-CM

## 2018-02-09 MED ORDER — VENLAFAXINE HCL ER 75 MG PO CP24: 150.0000 mg | ORAL_CAPSULE | Freq: Every day | ORAL | 0 refills | Status: DC

## 2018-02-09 MED ORDER — AMITRIPTYLINE HCL 25 MG PO TABS: 25.0000 mg | ORAL_TABLET | Freq: Every day | ORAL | 0 refills | Status: DC

## 2018-02-09 NOTE — Telephone Encounter (Signed)
Per Dr. Dennard Schaumann  - we need to see pt before refilling any medcations - pharm made aware via vm

## 2018-02-09 NOTE — Telephone Encounter (Signed)
He was released from jail and has not been seen since 2017 [facility] is requesting refills on his medications  effexor- xr 2tab at bedtime and elavil 1tab at bedtime.

## 2018-02-09 NOTE — Telephone Encounter (Signed)
Per Dr. Dennard Schaumann if pt makes apt will fill until then - me sent to [facility]

## 2018-02-15 ENCOUNTER — Ambulatory Visit: Payer: Medicare Other | Admitting: Family Medicine

## 2018-02-18 ENCOUNTER — Encounter: Payer: Self-pay | Admitting: Family Medicine

## 2018-02-18 ENCOUNTER — Ambulatory Visit (INDEPENDENT_AMBULATORY_CARE_PROVIDER_SITE_OTHER): Payer: BC Managed Care – PPO | Admitting: Family Medicine

## 2018-02-18 VITALS — BP 132/72 | HR 90 | Temp 98.6°F | Resp 14 | Ht 69.0 in | Wt 186.0 lb

## 2018-02-18 DIAGNOSIS — F321 Major depressive disorder, single episode, moderate: Secondary | ICD-10-CM

## 2018-02-18 DIAGNOSIS — I10 Essential (primary) hypertension: Secondary | ICD-10-CM | POA: Diagnosis not present

## 2018-02-18 DIAGNOSIS — Z952 Presence of prosthetic heart valve: Secondary | ICD-10-CM

## 2018-02-18 DIAGNOSIS — E118 Type 2 diabetes mellitus with unspecified complications: Secondary | ICD-10-CM

## 2018-02-18 DIAGNOSIS — Z23 Encounter for immunization: Secondary | ICD-10-CM

## 2018-02-18 DIAGNOSIS — Z1159 Encounter for screening for other viral diseases: Secondary | ICD-10-CM

## 2018-02-18 MED ORDER — VENLAFAXINE HCL ER 75 MG PO CP24: 150.0000 mg | ORAL_CAPSULE | Freq: Every day | ORAL | 3 refills | Status: DC

## 2018-02-18 MED ORDER — AMITRIPTYLINE HCL 25 MG PO TABS: 25.0000 mg | ORAL_TABLET | Freq: Every day | ORAL | 3 refills | Status: DC

## 2018-02-18 NOTE — Progress Notes (Signed)
Subjective:    Patient ID: Darrell [name], male    DOB: [birth year], 67 y.o.   MRN: [id number]  Diabetes   Medication Refill      Has history of aortic valve replacement with a bioprosthetic valve in 2013 (Dr. Servando Snare).  Patient also has a history of type 2 diabetes mellitus.  He recently got out of prison and is here today to reestablish care.  He has had Pneumovax 23 and Prevnar 13 however he is due for a booster on Pneumovax 23.  He is also due for his flu shot.  He is overdue for colonoscopy as well as a diabetic eye exam.  He declines both of these at the present time due to outstanding bills that he would like to pay first.  Diabetic foot exam was performed today and is normal.  He is currently taking glipizide and metformin.  [name] is on his medication list however the patient states he does not recognize that medication and he does not believe that he is actually taking that medication.  He is not checking his sugars often but he states that his fasting sugars in the morning are usually greater than 150 and his 2-hour postprandial sugars in the evening are usually near 200 indicating poor control.  He denies any polyuria polydipsia or blurry vision.  He denies any chest pain shortness of breath or dyspnea on exertion.  He is requesting for refill on his Effexor as well as his Elavil.  He takes Effexor for depression and he takes Elavil to help him sleep.  He ran out of that medication 2 days ago.  At which time he is starting to experience withdrawal symptoms including tremor, dizziness, blurry vision, and insomnia. Past Medical History:  Diagnosis Date  . Aortic stenosis 05/2011   Severe by recent echo; AoV area ~0.77-79 cm2, peak /mean gradient 86 mmHg/ 55 mmHg.  . Arthritis    back- low- [facility]- workmen's comp. situation   . DDD (degenerative disc disease), lumbosacral   . Depression    quick temper- per pt.   . Diverticulosis    slight -per colonoscopy, pt. asymptomatic-  thus far  . GERD (gastroesophageal reflux disease)   . Hiatal hernia   . History of BPH   . History of MRSA infection   . Hyperlipidemia   . Hypertension    [facility]- Dr. Ellyn Hack   . Insomnia   . Myocardial infarction ([place])    By report only, cardiac catheterization was negative for ischemia.  Marland Kitchen NIDDM (non-insulin dependent diabetes mellitus)    Type 2 NIDDM x 5 years  . Prostate disease   . Recurrent upper respiratory infection (URI)    cold- curently- "per pt., on the mend"  . S/P AVR (aortic valve replacement) 08/05/2011   Preop cath showed normal coronaries; AVR--Dr. Servando Snare, MD; 23 mm Magna Ease bioprosthetic Aortic Valve   Past Surgical History:  Procedure Laterality Date  . ABDOMINAL EXPOSURE N/A 04/01/2012   Procedure: ABDOMINAL EXPOSURE;  Surgeon: Angelia Mould, MD;  Location: [facility];  Service: Vascular;  Laterality: N/A;  . ANTERIOR LUMBAR FUSION N/A 04/01/2012   Procedure: ANTERIOR LUMBAR FUSION 1 LEVEL;  Surgeon: [name], MD;  Location: [facility];  Service: Orthopedics;  Laterality: N/A;  ALIF L5-S1  . AORTIC VALVE REPLACEMENT  08/05/2011   Procedure: AORTIC VALVE REPLACEMENT (AVR);  Surgeon: [name], MD;  Location: [facility];  Service: Open Heart Surgery;  Laterality: N/A;  . CARDIAC  CATHETERIZATION  06/2011    Minimal coronary disease  . LEFT AND RIGHT HEART CATHETERIZATION WITH CORONARY ANGIOGRAM  07/03/2011   Procedure: LEFT AND RIGHT HEART CATHETERIZATION WITH CORONARY ANGIOGRAM;  Surgeon: [name], MD;  Location: [facility];  Service: Cardiovascular;;  . LUMBAR FUSION  04/02/2012   Dr [name]  . NASAL SEPTUM SURGERY     [facility]  . NM MYOVIEW LTD  April 2013   Low risk.   Current Outpatient Medications on File Prior to Visit  Medication Sig Dispense Refill  . ACCU-CHEK SOFTCLIX LANCETS lancets     . amLODipine (NORVASC) 10 MG tablet Take 1 tablet (10 mg total) by mouth daily. 30 tablet 5  . aspirin 81 MG chewable tablet  Chew 81 mg by mouth daily.    . Blood Glucose Calibration (ACCU-CHEK AVIVA) SOLN Use to monitor FSBS 2x daily- Dx: E11.65. 3 each 3  . Blood Glucose Monitoring Suppl (ACCU-CHEK AVIVA PLUS) W/DEVICE KIT Use to monitor FSBS 2x daily- Dx: E11.65. 1 kit 3  . co-enzyme Q-10 50 MG capsule Take 50 mg by mouth daily.    . empagliflozin (JARDIANCE) 25 MG TABS tablet Take 25 mg by mouth daily. 30 tablet 3  . Garlic 9371 MG CAPS Take 1 capsule by mouth 2 (two) times daily.    Marland Kitchen glipiZIDE (GLUCOTROL XL) 10 MG 24 hr tablet TAKE 1 TABLET (10 MG TOTAL) BY MOUTH DAILY. 90 tablet 1  . glucose blood (ACCU-CHEK AVIVA) test strip Use to monitor FSBS 2x daily- Dx: E11.65. 300 each 3  . HYDROcodone-acetaminophen (NORCO/VICODIN) 5-325 MG tablet Take 1 tablet by mouth every 6 (six) hours as needed. 30 tablet 0  . Lancet Devices (ACCU-CHEK SOFTCLIX) lancets Use to monitor FSBS 2x daily- Dx: E11.65. 3 each 3  . linagliptin (TRADJENTA) 5 MG TABS tablet Take 1 tablet (5 mg total) by mouth daily. 30 tablet 3  . losartan (COZAAR) 50 MG tablet Take 1 tablet (50 mg total) by mouth daily. 30 tablet 5  . metFORMIN (GLUCOPHAGE) 1000 MG tablet Take 1 tablet (1,000 mg total) by mouth 2 (two) times daily with a meal. 180 tablet 0  . metoprolol succinate (TOPROL-XL) 25 MG 24 hr tablet Take 1 tablet (25 mg total) by mouth daily. 90 tablet 3  . Multiple Vitamin (MULITIVITAMIN WITH MINERALS) TABS Take 1 tablet by mouth daily.    . rosuvastatin (CRESTOR) 20 MG tablet Take 1 tablet (20 mg total) by mouth daily. 90 tablet 1  . sildenafil (VIAGRA) 100 MG tablet Take 0.5-1 tablets (50-100 mg total) by mouth daily as needed for erectile dysfunction. 10 tablet 11  . vitamin C (ASCORBIC ACID) 500 MG tablet Take 500 mg by mouth 2 (two) times daily.     . Vitamin D, Ergocalciferol, (DRISDOL) 50000 units CAPS capsule Take 1 capsule (50,000 Units total) by mouth every 7 (seven) days. For 6 months 12 capsule 1   No current facility-administered  medications on file prior to visit.    Allergies  Allergen Reactions  . Tape Rash   Social History   Socioeconomic History  . Marital status: Married    Spouse name: Not on file  . Number of children: 5  . Years of education: Not on file  . 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 Financial resource strain: Not on file  .  Food insecurity:    Worry: Not on file    Inability: Not on file  . Transportation needs:    Medical: Not on file    Non-medical: Not on file  Tobacco Use  . Smoking status: Never Smoker  . Smokeless tobacco: Never Used  Substance and Sexual Activity  . Alcohol use: No  . Drug use: No  . Sexual activity: Yes  Lifestyle  . Physical activity:    Days per week: Not on file    Minutes per session: Not on file  . Stress: Not on file  Relationships  . Social connections:    Talks on phone: Not on file    Gets together: Not on file    Attends religious service: Not on file    Active member of club or organization: Not on file    Attends meetings of clubs or organizations: Not on file    Relationship status: Not on file  . Intimate partner violence:    Fear of current or ex partner: Not on file    Emotionally abused: Not on file    Physically abused: Not on file    Forced sexual activity: Not on file  Other Topics Concern  . 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 HE  HAS 16 GRANDCHILDREN AND 5 GREAT GRAND CHILDREN . HE  EXERCISES  NOW WALKING ABOUT 5-6 TIMES A WEEK ABOUT 30 MINUTES AT A TIME AND HAS NO PROBLEM. HE DOES NOT SMOKE ,DOES NOT DRINK ALCOHOL.   He mostly enjoys going out for walks with his grandkids.              Review of Systems  All other systems reviewed and are negative.      Objective:   Physical Exam  Constitutional: He appears well-developed and well-nourished.    Neck: No JVD present.  Cardiovascular: Normal rate and regular rhythm.  Murmur heard. Pulmonary/Chest: Effort normal and breath sounds normal. No respiratory distress. He has no wheezes. He has no rales.  Abdominal: Soft. Bowel sounds are normal. He exhibits no distension. There is no abdominal tenderness. There is no rebound.  Musculoskeletal:        General: No edema.  Vitals reviewed.         Assessment & Plan:  Diabetes mellitus type 2 with complications ([place]) - Plan: Hemoglobin A1c, CBC with Differential/Platelet, COMPLETE METABOLIC PANEL WITH GFR, Lipid panel, Microalbumin, urine, Hemoglobin A1c  S/P AVR (aortic valve replacement) - for severe AS;   Current moderate episode of major depressive disorder, unspecified whether recurrent ([place]) - Plan: venlafaxine XR (EFFEXOR-XR) 75 MG 24 hr capsule  Benign essential HTN  Encounter for hepatitis C screening test for low risk patient - Plan: Hepatitis C Antibody Patient's blood sugars do not sound well controlled.  I will repeat a hemoglobin A1c.  Goal hemoglobin A1c is less than 7.  If greater than 7, I will likely resume Tradjenta versus Jardiance given the new cardioprotective benefits of this medication.  Recommended a diabetic eye exam as well as a colonoscopy which the patient politely declined.  Diabetic foot exam was performed today and is normal.  We will screen the patient for hepatitis C.  Also check a fasting lipid panel.  Patient received a booster on Pneumovax 23 as well as annual flu shot.  We will check a fasting lipid panel.  Goal LDL  cholesterol is less than 70 for this individual.  Blood pressure is adequately controlled at 132/72.  I did refill the patient's Effexor and his amitriptyline.  Await the results of his lab work.

## 2018-02-18 NOTE — Addendum Note (Signed)
Addended by: Sheral Flow on: 02/18/2018 12:41 PM   Modules accepted: Orders

## 2018-02-19 ENCOUNTER — Other Ambulatory Visit: Payer: BC Managed Care – PPO

## 2018-02-22 LAB — CBC WITH DIFFERENTIAL/PLATELET
Absolute Monocytes: 504 cells/uL (ref 200–950)
Basophils Absolute: 28 cells/uL (ref 0–200)
Basophils Relative: 0.4 %
Eosinophils Absolute: 207 cells/uL (ref 15–500)
Eosinophils Relative: 3 %
HCT: 44.4 % (ref 38.5–50.0)
Hemoglobin: 14.7 g/dL (ref 13.2–17.1)
Lymphs Abs: 1601 cells/uL (ref 850–3900)
MCH: 31.8 pg (ref 27.0–33.0)
MCHC: 33.1 g/dL (ref 32.0–36.0)
MCV: 96.1 fL (ref 80.0–100.0)
MPV: 10.7 fL (ref 7.5–12.5)
Monocytes Relative: 7.3 %
Neutro Abs: 4561 cells/uL (ref 1500–7800)
Neutrophils Relative %: 66.1 %
Platelets: 136 10*3/uL — ABNORMAL LOW (ref 140–400)
RBC: 4.62 10*6/uL (ref 4.20–5.80)
RDW: 12 % (ref 11.0–15.0)
Total Lymphocyte: 23.2 %
WBC: 6.9 10*3/uL (ref 3.8–10.8)

## 2018-02-22 LAB — COMPLETE METABOLIC PANEL WITH GFR
AG Ratio: 1.6 (calc) (ref 1.0–2.5)
ALT: 17 U/L (ref 9–46)
AST: 14 U/L (ref 10–35)
Albumin: 3.9 g/dL (ref 3.6–5.1)
Alkaline phosphatase (APISO): 60 U/L (ref 40–115)
BUN: 19 mg/dL (ref 7–25)
CALCIUM: 9.1 mg/dL (ref 8.6–10.3)
CO2: 24 mmol/L (ref 20–32)
Chloride: 102 mmol/L (ref 98–110)
Creat: 1.1 mg/dL (ref 0.70–1.25)
GFR, Est African American: 79 mL/min/{1.73_m2} (ref >=60)
GFR, Est Non African American: 68 mL/min/{1.73_m2} (ref >=60)
Globulin: 2.4 g/dL (calc) (ref 1.9–3.7)
Glucose, Bld: 288 mg/dL — ABNORMAL HIGH (ref 65–99)
Potassium: 5.2 mmol/L (ref 3.5–5.3)
Sodium: 137 mmol/L (ref 135–146)
Total Bilirubin: 0.8 mg/dL (ref 0.2–1.2)
Total Protein: 6.3 g/dL (ref 6.1–8.1)

## 2018-02-22 LAB — MICROALBUMIN, URINE: Microalb, Ur: 0.6 mg/dL

## 2018-02-22 LAB — HEPATITIS C ANTIBODY
Hepatitis C Ab: NONREACTIVE
SIGNAL TO CUT-OFF: 0.17 (ref <1.00)

## 2018-02-22 LAB — HEMOGLOBIN A1C
Hgb A1c MFr Bld: 10.9 % of total Hgb — ABNORMAL HIGH (ref <5.7)
Mean Plasma Glucose: 266 (calc)
eAG (mmol/L): 14.7 (calc)

## 2018-02-22 LAB — LIPID PANEL
Cholesterol: 143 mg/dL (ref <200)
HDL: 34 mg/dL — AB (ref >40)
LDL Cholesterol (Calc): 85 mg/dL (calc)
Non-HDL Cholesterol (Calc): 109 mg/dL (calc) (ref <130)
Total CHOL/HDL Ratio: 4.2 (calc) (ref <5.0)
Triglycerides: 139 mg/dL (ref <150)

## 2018-02-23 ENCOUNTER — Encounter: Payer: Self-pay | Admitting: Family Medicine

## 2018-03-05 ENCOUNTER — Encounter: Payer: Self-pay | Admitting: Family Medicine

## 2018-03-05 ENCOUNTER — Ambulatory Visit (INDEPENDENT_AMBULATORY_CARE_PROVIDER_SITE_OTHER): Payer: Medicare Other | Admitting: Family Medicine

## 2018-03-05 VITALS — BP 138/78 | HR 84 | Temp 97.8°F | Resp 18 | Ht 69.0 in | Wt 189.0 lb

## 2018-03-05 DIAGNOSIS — E1165 Type 2 diabetes mellitus with hyperglycemia: Secondary | ICD-10-CM

## 2018-03-05 DIAGNOSIS — IMO0001 Reserved for inherently not codable concepts without codable children: Secondary | ICD-10-CM

## 2018-03-05 MED ORDER — EMPAGLIFLOZIN-LINAGLIPTIN 25-5 MG PO TABS: 1.0000 | ORAL_TABLET | Freq: Every day | ORAL | 5 refills | Status: DC

## 2018-03-05 MED ORDER — GLIPIZIDE ER 10 MG PO TB24: 10.0000 mg | ORAL_TABLET | Freq: Every day | ORAL | 3 refills | Status: DC

## 2018-03-05 NOTE — Progress Notes (Signed)
Subjective:    Patient ID: Darrell [name], male    DOB: 1950/05/08, 68 y.o.   MRN: [id number]  Diabetes   Medication Refill     02/18/18 Has history of aortic valve replacement with a bioprosthetic valve in 2013 (Dr. Servando Snare).  Patient also has a history of type 2 diabetes mellitus.  He recently got out of prison and is here today to reestablish care.  He has had Pneumovax 23 and Prevnar 13 however he is due for a booster on Pneumovax 23.  He is also due for his flu shot.  He is overdue for colonoscopy as well as a diabetic eye exam.  He declines both of these at the present time due to outstanding bills that he would like to pay first.  Diabetic foot exam was performed today and is normal.  He is currently taking glipizide and metformin.  [name] is on his medication list however the patient states he does not recognize that medication and he does not believe that he is actually taking that medication.  He is not checking his sugars often but he states that his fasting sugars in the morning are usually greater than 150 and his 2-hour postprandial sugars in the evening are usually near 200 indicating poor control.  He denies any polyuria polydipsia or blurry vision.  He denies any chest pain shortness of breath or dyspnea on exertion.  He is requesting for refill on his Effexor as well as his Elavil.  He takes Effexor for depression and he takes Elavil to help him sleep.  He ran out of that medication 2 days ago.  At which time he is starting to experience withdrawal symptoms including tremor, dizziness, blurry vision, and insomnia.  At that time, my plan was: Patient's blood sugars do not sound well controlled.  I will repeat a hemoglobin A1c.  Goal hemoglobin A1c is less than 7.  If greater than 7, I will likely resume Tradjenta versus Jardiance given the new cardioprotective benefits of this medication.  Recommended a diabetic eye exam as well as a colonoscopy which the patient politely declined.   Diabetic foot exam was performed today and is normal.  We will screen the patient for hepatitis C.  Also check a fasting lipid panel.  Patient received a booster on Pneumovax 23 as well as annual flu shot.  We will check a fasting lipid panel.  Goal LDL cholesterol is less than 70 for this individual.  Blood pressure is adequately controlled at 132/72.  I did refill the patient's Effexor and his amitriptyline.  Await the results of his lab work.  03/05/18 Hemoglobin A1c was 10.9 indicating an average blood sugar of over 260.  His random one-time sugar that day was 288.  Patient is uncertain what medication he was taking.  Therefore I asked the patient to return with all of his pill bottles so that we can correct his medication list and determine the next steps to manage his diabetes.  Patient has been taking glipizide 20 mg twice a day!.  He is also been on metformin 1000 mg twice a day.  He has not been checking his sugar.  He denies feeling hypoglycemia.  He admits that over the last 2 months since he has been home from his incarceration, he has been eating poorly, eating a lot of sugary sweets and a high carbohydrate diet.  He denies any polyuria polydipsia or blurry vision. Past Medical History:  Diagnosis Date  . Aortic stenosis  05/2011   Severe by recent echo; AoV area ~0.77-79 cm2, peak /mean gradient 86 mmHg/ 55 mmHg.  . Arthritis    back- low- [facility]- workmen's comp. situation   . DDD (degenerative disc disease), lumbosacral   . Depression    quick temper- per pt.   . Diverticulosis    slight -per colonoscopy, pt. asymptomatic- thus far  . GERD (gastroesophageal reflux disease)   . Hiatal hernia   . History of BPH   . History of MRSA infection   . Hyperlipidemia   . Hypertension    [facility]- Dr. Ellyn Hack   . Insomnia   . Myocardial infarction ([place])    By report only, cardiac catheterization was negative for ischemia.  Marland Kitchen NIDDM (non-insulin dependent diabetes mellitus)    Type 2  NIDDM x 5 years  . Prostate disease   . Recurrent upper respiratory infection (URI)    cold- curently- "per pt., on the mend"  . S/P AVR (aortic valve replacement) 08/05/2011   Preop cath showed normal coronaries; AVR--Dr. Servando Snare, MD; 23 mm Magna Ease bioprosthetic Aortic Valve   Past Surgical History:  Procedure Laterality Date  . ABDOMINAL EXPOSURE N/A 04/01/2012   Procedure: ABDOMINAL EXPOSURE;  Surgeon: Angelia Mould, MD;  Location: [facility];  Service: Vascular;  Laterality: N/A;  . ANTERIOR LUMBAR FUSION N/A 04/01/2012   Procedure: ANTERIOR LUMBAR FUSION 1 LEVEL;  Surgeon: [name], MD;  Location: [facility];  Service: Orthopedics;  Laterality: N/A;  ALIF L5-S1  . AORTIC VALVE REPLACEMENT  08/05/2011   Procedure: AORTIC VALVE REPLACEMENT (AVR);  Surgeon: [name], MD;  Location: [facility];  Service: Open Heart Surgery;  Laterality: N/A;  . CARDIAC CATHETERIZATION  06/2011    Minimal coronary disease  . LEFT AND RIGHT HEART CATHETERIZATION WITH CORONARY ANGIOGRAM  07/03/2011   Procedure: LEFT AND RIGHT HEART CATHETERIZATION WITH CORONARY ANGIOGRAM;  Surgeon: [name], MD;  Location: [facility];  Service: Cardiovascular;;  . LUMBAR FUSION  04/02/2012   Dr [name]  . NASAL SEPTUM SURGERY     [facility] 10 [facility]  . NM MYOVIEW LTD  April 2013   Low risk.   Current Outpatient Medications on File Prior to Visit  Medication Sig Dispense Refill  . amitriptyline (ELAVIL) 25 MG tablet Take 1 tablet (25 mg total) by mouth at bedtime. 30 tablet 3  . atorvastatin (LIPITOR) 40 MG tablet Take 40 mg by mouth daily.    Marland Kitchen glipiZIDE (GLUCOTROL) 10 MG tablet Take 20 mg by mouth daily before breakfast.    . metFORMIN (GLUCOPHAGE) 1000 MG tablet Take 1 tablet (1,000 mg total) by mouth 2 (two) times daily with a meal. 180 tablet 0  . metoprolol tartrate (LOPRESSOR) 25 MG tablet Take 25 mg by mouth 2 (two) times daily.    Marland Kitchen venlafaxine XR (EFFEXOR-XR) 150 MG 24 hr capsule Take  150 mg by mouth at bedtime.     No current facility-administered medications on file prior to visit.    Allergies  Allergen Reactions  . Tape Rash   Social History   Socioeconomic History  . Marital status: Married    Spouse name: Not on file  . Number of children: 5  . Years of education: Not on file  . 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 Financial resource strain: Not on file  .  Food insecurity:    Worry: Not on file    Inability: Not on file  . Transportation needs:    Medical: Not on file    Non-medical: Not on file  Tobacco Use  . Smoking status: Never Smoker  . Smokeless tobacco: Never Used  Substance and Sexual Activity  . Alcohol use: No  . Drug use: No  . Sexual activity: Yes  Lifestyle  . Physical activity:    Days per week: Not on file    Minutes per session: Not on file  . Stress: Not on file  Relationships  . Social connections:    Talks on phone: Not on file    Gets together: Not on file    Attends religious service: Not on file    Active member of club or organization: Not on file    Attends meetings of clubs or organizations: Not on file    Relationship status: Not on file  . Intimate partner violence:    Fear of current or ex partner: Not on file    Emotionally abused: Not on file    Physically abused: Not on file    Forced sexual activity: Not on file  Other Topics Concern  . 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 HE  HAS 16 GRANDCHILDREN AND 5 GREAT GRAND CHILDREN . HE  EXERCISES  NOW WALKING ABOUT 5-6 TIMES A WEEK ABOUT 30 MINUTES AT A TIME AND HAS NO PROBLEM. HE DOES NOT SMOKE ,DOES NOT DRINK ALCOHOL.   He mostly enjoys going out for walks with his grandkids.              Review of Systems  All other systems reviewed and are negative.      Objective:   Physical  Exam  Constitutional: He appears well-developed and well-nourished.  Neck: No JVD present.  Cardiovascular: Normal rate and regular rhythm.  Murmur heard. Pulmonary/Chest: Effort normal and breath sounds normal. No respiratory distress. He has no wheezes. He has no rales.  Abdominal: Soft. Bowel sounds are normal. He exhibits no distension. There is no abdominal tenderness. There is no rebound.  Musculoskeletal:        General: No edema.  Vitals reviewed.         Assessment & Plan:  Uncontrolled diabetes mellitus type 2 without complications ([facility])  Discontinue glipizide 20 mg twice a day.  I am not comfortable with this dose and I believe it increases the risk of hypoglycemia.  Instead I will switch the patient to Glucotrol XL 10 mg once a day.  Meanwhile I will start the patient on Glyxambi 5/25 1 p.o. daily and recheck fasting blood sugars and 2-hour postprandial sugars in 6 weeks.  At that time we will need to discuss possibly adding Trulicity if blood sugars are still not reasonably controlled

## 2018-04-16 ENCOUNTER — Ambulatory Visit: Payer: Medicare Other | Admitting: Family Medicine

## 2018-04-26 ENCOUNTER — Ambulatory Visit (INDEPENDENT_AMBULATORY_CARE_PROVIDER_SITE_OTHER): Payer: Medicare Other | Admitting: Family Medicine

## 2018-04-26 ENCOUNTER — Encounter: Payer: Self-pay | Admitting: Family Medicine

## 2018-04-26 VITALS — BP 110/64 | HR 98 | Temp 97.9°F | Resp 18 | Ht 69.0 in | Wt 190.0 lb

## 2018-04-26 DIAGNOSIS — E1169 Type 2 diabetes mellitus with other specified complication: Secondary | ICD-10-CM | POA: Diagnosis not present

## 2018-04-26 NOTE — Progress Notes (Signed)
Subjective:    Patient ID: Darrell [name], male    DOB: [birth year], 68 y.o.   MRN: [id number]  Diabetes   Medication Refill     02/18/18 Has history of aortic valve replacement with a bioprosthetic valve in 2013 (Dr. Servando Snare).  Patient also has a history of type 2 diabetes mellitus.  He recently got out of prison and is here today to reestablish care.  He has had Pneumovax 23 and Prevnar 13 however he is due for a booster on Pneumovax 23.  He is also due for his flu shot.  He is overdue for colonoscopy as well as a diabetic eye exam.  He declines both of these at the present time due to outstanding bills that he would like to pay first.  Diabetic foot exam was performed today and is normal.  He is currently taking glipizide and metformin.  Darrell [name] is on his medication list however the patient states he does not recognize that medication and he does not believe that he is actually taking that medication.  He is not checking his sugars often but he states that his fasting sugars in the morning are usually greater than 150 and his 2-hour postprandial sugars in the evening are usually near 200 indicating poor control.  He denies any polyuria polydipsia or blurry vision.  He denies any chest pain shortness of breath or dyspnea on exertion.  He is requesting for refill on his Effexor as well as his Elavil.  He takes Effexor for depression and he takes Elavil to help him sleep.  He ran out of that medication 2 days ago.  At which time he is starting to experience withdrawal symptoms including tremor, dizziness, blurry vision, and insomnia.  At that time, my plan was: Patient's blood sugars do not sound well controlled.  I will repeat a hemoglobin A1c.  Goal hemoglobin A1c is less than 7.  If greater than 7, I will likely resume Tradjenta versus Jardiance given the new cardioprotective benefits of this medication.  Recommended a diabetic eye exam as well as a colonoscopy which the patient politely declined.   Diabetic foot exam was performed today and is normal.  We will screen the patient for hepatitis C.  Also check a fasting lipid panel.  Patient received a booster on Pneumovax 23 as well as annual flu shot.  We will check a fasting lipid panel.  Goal LDL cholesterol is less than 70 for this individual.  Blood pressure is adequately controlled at 132/72.  I did refill the patient's Effexor and his amitriptyline.  Await the results of his lab work.  03/05/18 Hemoglobin A1c was 10.9 indicating an average blood sugar of over 260.  His random one-time sugar that day was 288.  Patient is uncertain what medication he was taking.  Therefore I asked the patient to return with all of his pill bottles so that we can correct his medication list and determine the next steps to manage his diabetes.  Patient has been taking glipizide 20 mg twice a day!.  He is also been on metformin 1000 mg twice a day.  He has not been checking his sugar.  He denies feeling hypoglycemia.  He admits that over the last 2 months since he has been home from his incarceration, he has been eating poorly, eating a lot of sugary sweets and a high carbohydrate diet.  He denies any polyuria polydipsia or blurry vision.  At that time, my plan was: Discontinue glipizide 20  mg twice a day.  I am not comfortable with this dose and I believe it increases the risk of hypoglycemia.  Instead I will switch the patient to Glucotrol XL 10 mg once a day.  Meanwhile I will start the patient on Glyxambi 5/25 1 p.o. daily and recheck fasting blood sugars and 2-hour postprandial sugars in 6 weeks.  At that time we will need to discuss possibly adding Trulicity if blood sugars are still not reasonably controlled  04/26/18 Patient never started the Darrell [name].  He is only taking glipizide 10 mg once a day and metformin.  However he is not sure that he is actually taking that.  He is checked his blood sugars occasionally.  His fasting blood sugar has been as low as 90.   After breakfast this morning his sugar was 150.  He denies any sugars over 200.  He is not checking any 2-hour postprandial sugars.  He has no recorded blood sugar values.  He denies any chest pain shortness of breath dyspnea on exertion.  He denies any myalgias or right upper quadrant pain.  He denies any polyuria polydipsia or blurry vision Past Medical History:  Diagnosis Date  . Aortic stenosis 05/2011   Severe by recent echo; AoV area ~0.77-79 cm2, peak /mean gradient 86 mmHg/ 55 mmHg.  . Arthritis    back- low- [facility]- workmen's comp. situation   . DDD (degenerative disc disease), lumbosacral   . Depression    quick temper- per pt.   . Diverticulosis    slight -per colonoscopy, pt. asymptomatic- thus far  . GERD (gastroesophageal reflux disease)   . Hiatal hernia   . History of BPH   . History of MRSA infection   . Hyperlipidemia   . Hypertension    [facility]- Dr. Ellyn Hack   . Insomnia   . Myocardial infarction ([place])    By report only, cardiac catheterization was negative for ischemia.  Marland Kitchen NIDDM (non-insulin dependent diabetes mellitus)    Type 2 NIDDM x 5 years  . Prostate disease   . Recurrent upper respiratory infection (URI)    cold- curently- "per pt., on the mend"  . S/P AVR (aortic valve replacement) 08/05/2011   Preop cath showed normal coronaries; AVR--Dr. Servando Snare, MD; 23 mm Magna Ease bioprosthetic Aortic Valve   Past Surgical History:  Procedure Laterality Date  . ABDOMINAL EXPOSURE N/A 04/01/2012   Procedure: ABDOMINAL EXPOSURE;  Surgeon: Angelia Mould, MD;  Location: [facility];  Service: Vascular;  Laterality: N/A;  . ANTERIOR LUMBAR FUSION N/A 04/01/2012   Procedure: ANTERIOR LUMBAR FUSION 1 LEVEL;  Surgeon: [name], MD;  Location: [facility];  Service: Orthopedics;  Laterality: N/A;  ALIF L5-S1  . AORTIC VALVE REPLACEMENT  08/05/2011   Procedure: AORTIC VALVE REPLACEMENT (AVR);  Surgeon: [name], MD;  Location: [facility];  Service: Open Heart  Surgery;  Laterality: N/A;  . CARDIAC CATHETERIZATION  06/2011    Minimal coronary disease  . LEFT AND RIGHT HEART CATHETERIZATION WITH CORONARY ANGIOGRAM  07/03/2011   Procedure: LEFT AND RIGHT HEART CATHETERIZATION WITH CORONARY ANGIOGRAM;  Surgeon: [name], MD;  Location: [facility] CATH [facility];  Service: Cardiovascular;;  . LUMBAR FUSION  04/02/2012   Dr [name]  . NASAL SEPTUM SURGERY     [facility] 10 yrs[facility] ago  . NM MYOVIEW LTD  April 2013   Low risk.   Current Outpatient Medications on File Prior to Visit  Medication Sig Dispense Refill  .  amitriptyline (ELAVIL) 25 MG tablet Take 1 tablet (25 mg total) by mouth at bedtime. 30 tablet 3  . atorvastatin (LIPITOR) 40 MG tablet Take 40 mg by mouth daily.    . Empagliflozin-linaGLIPtin (GLYXAMBI) 25-5 MG TABS Take 1 tablet by mouth daily. 30 tablet 5  . glipiZIDE (GLUCOTROL XL) 10 MG 24 hr tablet Take 1 tablet (10 mg total) by mouth daily with breakfast. 30 tablet 3  . metFORMIN (GLUCOPHAGE) 1000 MG tablet Take 1 tablet (1,000 mg total) by mouth 2 (two) times daily with a meal. 180 tablet 0  . metoprolol tartrate (LOPRESSOR) 25 MG tablet Take 25 mg by mouth 2 (two) times daily.    Marland Kitchen venlafaxine XR (EFFEXOR-XR) 150 MG 24 hr capsule Take 150 mg by mouth at bedtime.     No current facility-administered medications on file prior to visit.    Allergies  Allergen Reactions  . Tape Rash   Social History   Socioeconomic History  . Marital status: Married    Spouse name: Not on file  . Number of children: 5  . Years of education: Not on file  . 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 Financial resource strain: Not on file  . Food insecurity:    Worry: Not on file    Inability: Not on file  . Transportation needs:    Medical: Not on file    Non-medical: Not on file  Tobacco Use  . Smoking status: Never Smoker  . Smokeless tobacco: Never  Used  Substance and Sexual Activity  . Alcohol use: No  . Drug use: No  . Sexual activity: Yes  Lifestyle  . Physical activity:    Days per week: Not on file    Minutes per session: Not on file  . Stress: Not on file  Relationships  . Social connections:    Talks on phone: Not on file    Gets together: Not on file    Attends religious service: Not on file    Active member of club or organization: Not on file    Attends meetings of clubs or organizations: Not on file    Relationship status: Not on file  . Intimate partner violence:    Fear of current or ex partner: Not on file    Emotionally abused: Not on file    Physically abused: Not on file    Forced sexual activity: Not on file  Other Topics Concern  . 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 HE  HAS 16 GRANDCHILDREN AND 5 GREAT GRAND CHILDREN . HE  EXERCISES  NOW WALKING ABOUT 5-6 TIMES A WEEK ABOUT 30 MINUTES AT A TIME AND HAS NO PROBLEM. HE DOES NOT SMOKE ,DOES NOT DRINK ALCOHOL.   He mostly enjoys going out for walks with his grandkids.              Review of Systems  All other systems reviewed and are negative.      Objective:   Physical Exam  Constitutional: He appears well-developed and well-nourished.  Neck: No JVD present.  Cardiovascular: Normal rate and regular rhythm.  Murmur heard. Pulmonary/Chest: Effort normal and breath sounds normal. No respiratory distress. He has no wheezes. He has no rales.  Abdominal: Soft. Bowel sounds are normal.  He exhibits no distension. There is no abdominal tenderness. There is no rebound.  Musculoskeletal:        General: No edema.  Vitals reviewed.         Assessment & Plan:  Type 2 diabetes mellitus with other specified complication, without long-term current use of insulin (HCC) - Plan: Hemoglobin A1c  I explained to the patient that it is impossible to control his diabetes if he does  not take his sugars and if or not certain of the medication he is actually taking.  I will be unable to help him.  His blood pressure today is well controlled.  The reported sugar values he tells me sound excellent.  However I will repeat his hemoglobin A1c today to get an estimate of what his sugars are truly running.  I suspect that his A1c will reflect sugar control that is not as well-controlled as what he is reflected in the 3 separate values he told me.  If his hemoglobin A1c is greater than 7, I would strongly recommend starting Glyxambi as planned.

## 2018-04-27 LAB — HEMOGLOBIN A1C
Hgb A1c MFr Bld: 8.8 % of total Hgb — ABNORMAL HIGH (ref <5.7)
Mean Plasma Glucose: 206 (calc)
eAG (mmol/L): 11.4 (calc)

## 2018-04-28 ENCOUNTER — Encounter: Payer: Self-pay | Admitting: Family Medicine

## 2018-05-25 ENCOUNTER — Other Ambulatory Visit: Payer: Self-pay | Admitting: Family Medicine

## 2018-06-17 ENCOUNTER — Other Ambulatory Visit: Payer: Self-pay | Admitting: Family Medicine

## 2018-08-28 ENCOUNTER — Other Ambulatory Visit: Payer: Self-pay | Admitting: Family Medicine

## 2018-09-17 ENCOUNTER — Other Ambulatory Visit: Payer: Self-pay | Admitting: Family Medicine

## 2018-10-22 ENCOUNTER — Ambulatory Visit: Payer: BC Managed Care – PPO | Admitting: Family Medicine

## 2018-10-25 ENCOUNTER — Other Ambulatory Visit: Payer: Self-pay | Admitting: Family Medicine

## 2018-10-25 ENCOUNTER — Other Ambulatory Visit: Payer: Self-pay

## 2018-10-25 ENCOUNTER — Encounter: Payer: Self-pay | Admitting: Family Medicine

## 2018-10-25 ENCOUNTER — Ambulatory Visit: Payer: BC Managed Care – PPO | Admitting: Family Medicine

## 2018-10-25 VITALS — BP 130/76 | HR 88 | Temp 98.5°F | Resp 18 | Ht 69.0 in | Wt 187.0 lb

## 2018-10-25 DIAGNOSIS — B029 Zoster without complications: Secondary | ICD-10-CM

## 2018-10-25 DIAGNOSIS — H60331 Swimmer's ear, right ear: Secondary | ICD-10-CM | POA: Diagnosis not present

## 2018-10-25 MED ORDER — VALACYCLOVIR HCL 1 G PO TABS: 1000.0000 mg | ORAL_TABLET | Freq: Three times a day (TID) | ORAL | 0 refills | Status: DC

## 2018-10-25 MED ORDER — NEOMYCIN-POLYMYXIN-HC 3.5-10000-1 OT SOLN: 4.0000 [drp] | Freq: Four times a day (QID) | OTIC | 0 refills | Status: DC

## 2018-10-25 NOTE — Progress Notes (Signed)
Subjective:    Patient ID: Darrell [name], male    DOB: [birth year], 68 y.o.   MRN: [id number]  HPI Patient presents today with a painful rash on his left flank.  He states that the rash is been there for several weeks.  It itches and burns.  Please see the photo below:   Past Medical History:  Diagnosis Date  . Aortic stenosis 05/2011   Severe by recent echo; AoV area ~0.77-79 cm2, peak /mean gradient 86 mmHg/ 55 mmHg.  . Arthritis    back- low- [facility]- workmen's comp. situation   . DDD (degenerative disc disease), lumbosacral   . Depression    quick temper- per pt.   . Diverticulosis    slight -per colonoscopy, pt. asymptomatic- thus far  . GERD (gastroesophageal reflux disease)   . Hiatal hernia   . History of BPH   . History of MRSA infection   . Hyperlipidemia   . Hypertension    [facility]- Dr. Ellyn Hack   . Insomnia   . Myocardial infarction ([place])    By report only, cardiac catheterization was negative for ischemia.  Marland Kitchen NIDDM (non-insulin dependent diabetes mellitus)    Type 2 NIDDM x 5 years  . Prostate disease   . Recurrent upper respiratory infection (URI)    cold- curently- "per pt., on the mend"  . S/P AVR (aortic valve replacement) 08/05/2011   Preop cath showed normal coronaries; AVR--Dr. Servando Snare, MD; 23 mm Magna Ease bioprosthetic Aortic Valve   Past Surgical History:  Procedure Laterality Date  . ABDOMINAL EXPOSURE N/A 04/01/2012   Procedure: ABDOMINAL EXPOSURE;  Surgeon: Angelia Mould, MD;  Location: [facility];  Service: Vascular;  Laterality: N/A;  . ANTERIOR LUMBAR FUSION N/A 04/01/2012   Procedure: ANTERIOR LUMBAR FUSION 1 LEVEL;  Surgeon: [name], MD;  Location: [facility];  Service: Orthopedics;  Laterality: N/A;  ALIF L5-S1  . AORTIC VALVE REPLACEMENT  08/05/2011   Procedure: AORTIC VALVE REPLACEMENT (AVR);  Surgeon: [name], MD;  Location: [facility];  Service: Open Heart Surgery;  Laterality: N/A;  . CARDIAC CATHETERIZATION  06/2011    Minimal coronary disease  . LEFT AND RIGHT HEART CATHETERIZATION WITH CORONARY ANGIOGRAM  07/03/2011   Procedure: LEFT AND RIGHT HEART CATHETERIZATION WITH CORONARY ANGIOGRAM;  Surgeon: [name], MD;  Location: [facility] CATH [facility];  Service: Cardiovascular;;  . LUMBAR FUSION  04/02/2012   Dr [name]  . NASAL SEPTUM SURGERY     [facility]- 10 yrs[facility] ago  . NM MYOVIEW LTD  April 2013   Low risk.   Current Outpatient Medications on File Prior to Visit  Medication Sig Dispense Refill  . amitriptyline (ELAVIL) 25 MG tablet TAKE 1 TABLET BY MOUTH AT BEDTIME 30 tablet 3  . atorvastatin (LIPITOR) 40 MG tablet Take 40 mg by mouth daily.    Marland Kitchen GLYXAMBI 25-5 MG TABS TAKE 1 TABLET BY MOUTH EVERY DAY 30 tablet 5  . metoprolol tartrate (LOPRESSOR) 25 MG tablet Take 25 mg by mouth 2 (two) times daily.    Marland Kitchen venlafaxine XR (EFFEXOR-XR) 150 MG 24 hr capsule TAKE ONE CAPSULE BY MOUTH AT BEDTIME 60 capsule 3  . metFORMIN (GLUCOPHAGE) 1000 MG tablet Take 1 tablet (1,000 mg total) by mouth 2 (two) times daily with a meal. (Patient not taking: Reported on 10/25/2018) 180 tablet 0   No current facility-administered medications on file prior to visit.    Allergies  Allergen Reactions  . Tape Rash  Social History   Socioeconomic History  . Marital status: Married    Spouse name: Not on file  . Number of children: 5  . Years of education: Not on file  . 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 Financial resource strain: Not on file  . Food insecurity    Worry: Not on file    Inability: Not on file  . Transportation needs    Medical: Not on file    Non-medical: Not on file  Tobacco Use  . Smoking status: Never Smoker  . Smokeless tobacco: Never Used  Substance and Sexual Activity  . Alcohol use: No  . Drug use: No  . Sexual activity: Yes  Lifestyle  . Physical activity    Days per week: Not on file     Minutes per session: Not on file  . Stress: Not on file  Relationships  . Social Herbalist on phone: Not on file    Gets together: Not on file    Attends religious service: Not on file    Active member of club or organization: Not on file    Attends meetings of clubs or organizations: Not on file    Relationship status: Not on file  . Intimate partner violence    Fear of current or ex partner: Not on file    Emotionally abused: Not on file    Physically abused: Not on file    Forced sexual activity: Not on file  Other Topics Concern  . 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 HE  HAS 16 GRANDCHILDREN AND 5 GREAT GRAND CHILDREN . HE  EXERCISES  NOW WALKING ABOUT 5-6 TIMES A WEEK ABOUT 30 MINUTES AT A TIME AND HAS NO PROBLEM. HE DOES NOT SMOKE ,DOES NOT DRINK ALCOHOL.   He mostly enjoys going out for walks with his grandkids.              Review of Systems  All other systems reviewed and are negative.      Objective:   Physical Exam Constitutional:      Appearance: Normal appearance. He is normal weight.  Cardiovascular:     Rate and Rhythm: Normal rate and regular rhythm.     Heart sounds: Normal heart sounds.  Pulmonary:     Effort: Pulmonary effort is normal.     Breath sounds: Normal breath sounds.  Skin:    Findings: Rash present.  Neurological:     Mental Status: He is alert.           Assessment & Plan:  Herpes zoster without complication  Acute swimmer's ear of right side  Rashes consistent with shingles.  I explained to him that he is outside the therapeutic window for Valtrex.  The pain is mild and he does not feel that he needs anything for the pain.  However he would like to take the Valtrex 1 g p.o. 3 times daily for 7 days to try to expedite the healing.  He also has some mild itching in his right auditory canal.  On visual inspection he appears to have mild  swimmer's ear and I will treat this with otitis externa 4 drops 4 times daily for 7 days.

## 2019-03-03 ENCOUNTER — Ambulatory Visit: Payer: BC Managed Care – PPO | Attending: Internal Medicine

## 2019-04-02 ENCOUNTER — Encounter (HOSPITAL_COMMUNITY)
Admission: EM | Disposition: A | Discharge status: Home / Self Care | Payer: Self-pay | Source: Home / Self Care | Attending: Cardiovascular Disease

## 2019-04-02 ENCOUNTER — Inpatient Hospital Stay (HOSPITAL_COMMUNITY)
Admission: EM | Admit: 2019-04-02 | Discharge: 2019-04-04 | DRG: 246 | Disposition: A | Discharge status: Home / Self Care | Payer: Medicare Other | Attending: Cardiovascular Disease | Admitting: Cardiovascular Disease

## 2019-04-02 DIAGNOSIS — I2510 Atherosclerotic heart disease of native coronary artery without angina pectoris: Secondary | ICD-10-CM | POA: Diagnosis present

## 2019-04-02 DIAGNOSIS — E785 Hyperlipidemia, unspecified: Secondary | ICD-10-CM | POA: Diagnosis present

## 2019-04-02 DIAGNOSIS — Z79899 Other long term (current) drug therapy: Secondary | ICD-10-CM

## 2019-04-02 DIAGNOSIS — U071 COVID-19: Secondary | ICD-10-CM | POA: Diagnosis not present

## 2019-04-02 DIAGNOSIS — Z7984 Long term (current) use of oral hypoglycemic drugs: Secondary | ICD-10-CM | POA: Diagnosis not present

## 2019-04-02 DIAGNOSIS — E1169 Type 2 diabetes mellitus with other specified complication: Secondary | ICD-10-CM | POA: Diagnosis not present

## 2019-04-02 DIAGNOSIS — Z981 Arthrodesis status: Secondary | ICD-10-CM

## 2019-04-02 DIAGNOSIS — Z953 Presence of xenogenic heart valve: Secondary | ICD-10-CM

## 2019-04-02 DIAGNOSIS — E08649 Diabetes mellitus due to underlying condition with hypoglycemia without coma: Secondary | ICD-10-CM | POA: Diagnosis not present

## 2019-04-02 DIAGNOSIS — F329 Major depressive disorder, single episode, unspecified: Secondary | ICD-10-CM | POA: Diagnosis present

## 2019-04-02 DIAGNOSIS — I2102 ST elevation (STEMI) myocardial infarction involving left anterior descending coronary artery: Principal | ICD-10-CM | POA: Diagnosis present

## 2019-04-02 DIAGNOSIS — K219 Gastro-esophageal reflux disease without esophagitis: Secondary | ICD-10-CM | POA: Diagnosis not present

## 2019-04-02 DIAGNOSIS — R079 Chest pain, unspecified: Secondary | ICD-10-CM | POA: Diagnosis not present

## 2019-04-02 DIAGNOSIS — Z8249 Family history of ischemic heart disease and other diseases of the circulatory system: Secondary | ICD-10-CM | POA: Diagnosis not present

## 2019-04-02 DIAGNOSIS — I219 Acute myocardial infarction, unspecified: Secondary | ICD-10-CM

## 2019-04-02 DIAGNOSIS — I213 ST elevation (STEMI) myocardial infarction of unspecified site: Secondary | ICD-10-CM

## 2019-04-02 DIAGNOSIS — I10 Essential (primary) hypertension: Secondary | ICD-10-CM | POA: Diagnosis not present

## 2019-04-02 DIAGNOSIS — Z955 Presence of coronary angioplasty implant and graft: Secondary | ICD-10-CM

## 2019-04-02 HISTORY — DX: ST elevation (STEMI) myocardial infarction of unspecified site: I21.3

## 2019-04-02 HISTORY — PX: CORONARY/GRAFT ACUTE MI REVASCULARIZATION: CATH118305

## 2019-04-02 HISTORY — PX: LEFT HEART CATH AND CORONARY ANGIOGRAPHY: CATH118249

## 2019-04-02 LAB — LIPID PANEL
Cholesterol: 245 mg/dL — ABNORMAL HIGH (ref 0–200)
HDL: 32 mg/dL — ABNORMAL LOW (ref >40)
LDL Cholesterol: 139 mg/dL — ABNORMAL HIGH (ref 0–99)
Total CHOL/HDL Ratio: 7.7 RATIO
Triglycerides: 371 mg/dL — ABNORMAL HIGH (ref <150)
VLDL: 74 mg/dL — ABNORMAL HIGH (ref 0–40)

## 2019-04-02 LAB — TROPONIN I (HIGH SENSITIVITY): Troponin I (High Sensitivity): 4532 ng/L (ref <18)

## 2019-04-02 LAB — HEMOGLOBIN A1C
Hgb A1c MFr Bld: 11.6 % — ABNORMAL HIGH (ref 4.8–5.6)
Mean Plasma Glucose: 286.22 mg/dL

## 2019-04-02 LAB — COMPREHENSIVE METABOLIC PANEL
ALT: 25 U/L (ref 0–44)
AST: 73 U/L — ABNORMAL HIGH (ref 15–41)
Albumin: 3 g/dL — ABNORMAL LOW (ref 3.5–5.0)
Alkaline Phosphatase: 63 U/L (ref 38–126)
Anion gap: 12 (ref 5–15)
BUN: 13 mg/dL (ref 8–23)
CO2: 20 mmol/L — ABNORMAL LOW (ref 22–32)
Calcium: 8.5 mg/dL — ABNORMAL LOW (ref 8.9–10.3)
Chloride: 99 mmol/L (ref 98–111)
Creatinine, Ser: 0.96 mg/dL (ref 0.61–1.24)
GFR calc Af Amer: >60 mL/min (ref >60)
GFR calc non Af Amer: >60 mL/min (ref >60)
Glucose, Bld: 371 mg/dL — ABNORMAL HIGH (ref 70–99)
Potassium: 4.3 mmol/L (ref 3.5–5.1)
Sodium: 131 mmol/L — ABNORMAL LOW (ref 135–145)
Total Bilirubin: 0.6 mg/dL (ref 0.3–1.2)
Total Protein: 7 g/dL (ref 6.5–8.1)

## 2019-04-02 LAB — CBC
HCT: 40.1 % (ref 39.0–52.0)
Hemoglobin: 14 g/dL (ref 13.0–17.0)
MCH: 32.7 pg (ref 26.0–34.0)
MCHC: 34.9 g/dL (ref 30.0–36.0)
MCV: 93.7 fL (ref 80.0–100.0)
Platelets: 189 10*3/uL (ref 150–400)
RBC: 4.28 MIL/uL (ref 4.22–5.81)
RDW: 12.4 % (ref 11.5–15.5)
WBC: 10 10*3/uL (ref 4.0–10.5)
nRBC: 0 % (ref 0.0–0.2)

## 2019-04-02 LAB — RESPIRATORY PANEL BY RT PCR (FLU A&B, COVID)
Influenza A by PCR: NEGATIVE
Influenza B by PCR: NEGATIVE
SARS Coronavirus 2 by RT PCR: POSITIVE — AB

## 2019-04-02 LAB — GLUCOSE, CAPILLARY: Glucose-Capillary: 311 mg/dL — ABNORMAL HIGH (ref 70–99)

## 2019-04-02 LAB — PROTIME-INR
INR: 1 (ref 0.8–1.2)
Prothrombin Time: 13.5 seconds (ref 11.4–15.2)

## 2019-04-02 LAB — APTT: aPTT: 32 seconds (ref 24–36)

## 2019-04-02 SURGERY — CORONARY/GRAFT ACUTE MI REVASCULARIZATION
Anesthesia: LOCAL

## 2019-04-02 MED ORDER — MIDAZOLAM HCL 2 MG/2ML IJ SOLN
INTRAMUSCULAR | Status: AC
  Filled 2019-04-02: qty 2

## 2019-04-02 MED ORDER — LIDOCAINE HCL (PF) 1 % IJ SOLN
INTRAMUSCULAR | Status: DC | PRN
  Administered 2019-04-02: 15 mL
  Administered 2019-04-02: 2 mL

## 2019-04-02 MED ORDER — HEPARIN (PORCINE) IN NACL 1000-0.9 UT/500ML-% IV SOLN
INTRAVENOUS | Status: AC
  Filled 2019-04-02: qty 1000

## 2019-04-02 MED ORDER — FENTANYL CITRATE (PF) 100 MCG/2ML IJ SOLN
INTRAMUSCULAR | Status: AC
  Filled 2019-04-02: qty 2

## 2019-04-02 MED ORDER — SODIUM CHLORIDE 0.9% FLUSH: 3.0000 mL | INTRAVENOUS | Status: DC | PRN

## 2019-04-02 MED ORDER — HYDRALAZINE HCL 20 MG/ML IJ SOLN: 10.0000 mg | INTRAMUSCULAR | Status: DC | PRN

## 2019-04-02 MED ORDER — MIDAZOLAM HCL 2 MG/2ML IJ SOLN
INTRAMUSCULAR | Status: DC | PRN
  Administered 2019-04-02: 2 mg via INTRAVENOUS
  Administered 2019-04-02: 1 mg via INTRAVENOUS

## 2019-04-02 MED ORDER — NITROGLYCERIN 1 MG/10 ML FOR IR/CATH LAB
INTRA_ARTERIAL | Status: AC
  Filled 2019-04-02: qty 10

## 2019-04-02 MED ORDER — NITROGLYCERIN 1 MG/10 ML FOR IR/CATH LAB
INTRA_ARTERIAL | Status: DC | PRN
  Administered 2019-04-02 (×2): 200 ug via INTRACORONARY

## 2019-04-02 MED ORDER — ATORVASTATIN CALCIUM 40 MG PO TABS
40.0000 mg | ORAL_TABLET | Freq: Every day | ORAL | Status: DC
  Administered 2019-04-02 – 2019-04-04 (×3): 40 mg via ORAL
  Filled 2019-04-02 (×3): qty 1

## 2019-04-02 MED ORDER — GLIPIZIDE ER 5 MG PO TB24
5.0000 mg | ORAL_TABLET | Freq: Every day | ORAL | Status: DC
  Administered 2019-04-03 – 2019-04-04 (×2): 5 mg via ORAL
  Filled 2019-04-02 (×4): qty 1

## 2019-04-02 MED ORDER — ASPIRIN EC 81 MG PO TBEC
81.0000 mg | DELAYED_RELEASE_TABLET | Freq: Every day | ORAL | Status: DC
  Administered 2019-04-03 – 2019-04-04 (×2): 81 mg via ORAL
  Filled 2019-04-02 (×2): qty 1

## 2019-04-02 MED ORDER — VERAPAMIL HCL 2.5 MG/ML IV SOLN
INTRAVENOUS | Status: AC
  Filled 2019-04-02: qty 2

## 2019-04-02 MED ORDER — TICAGRELOR 90 MG PO TABS
ORAL_TABLET | ORAL | Status: AC
  Filled 2019-04-02: qty 2

## 2019-04-02 MED ORDER — SODIUM CHLORIDE 0.9 % WEIGHT BASED INFUSION
85.0000 mL/h | INTRAVENOUS | Status: DC
  Administered 2019-04-02 – 2019-04-03 (×2): 85 mL/h via INTRAVENOUS

## 2019-04-02 MED ORDER — AMITRIPTYLINE HCL 25 MG PO TABS
25.0000 mg | ORAL_TABLET | Freq: Every day | ORAL | Status: DC
  Administered 2019-04-03: 25 mg via ORAL
  Filled 2019-04-02 (×3): qty 1

## 2019-04-02 MED ORDER — SODIUM CHLORIDE 0.9 % IV SOLN: 250.0000 mL | INTRAVENOUS | Status: DC | PRN

## 2019-04-02 MED ORDER — VENLAFAXINE HCL ER 75 MG PO CP24
150.0000 mg | ORAL_CAPSULE | Freq: Every day | ORAL | Status: DC
  Administered 2019-04-02 – 2019-04-03 (×2): 150 mg via ORAL
  Filled 2019-04-02: qty 1
  Filled 2019-04-02: qty 2

## 2019-04-02 MED ORDER — ACETAMINOPHEN 325 MG PO TABS: 650.0000 mg | ORAL_TABLET | ORAL | Status: DC | PRN

## 2019-04-02 MED ORDER — METOPROLOL TARTRATE 25 MG PO TABS
25.0000 mg | ORAL_TABLET | Freq: Two times a day (BID) | ORAL | Status: DC
  Administered 2019-04-02 – 2019-04-04 (×4): 25 mg via ORAL
  Filled 2019-04-02 (×4): qty 1

## 2019-04-02 MED ORDER — LIDOCAINE HCL (PF) 1 % IJ SOLN
INTRAMUSCULAR | Status: AC
  Filled 2019-04-02: qty 30

## 2019-04-02 MED ORDER — NITROGLYCERIN 0.4 MG SL SUBL: 0.4000 mg | SUBLINGUAL_TABLET | SUBLINGUAL | Status: DC | PRN

## 2019-04-02 MED ORDER — IOHEXOL 350 MG/ML SOLN
INTRAVENOUS | Status: AC
  Filled 2019-04-02: qty 1

## 2019-04-02 MED ORDER — VERAPAMIL HCL 2.5 MG/ML IV SOLN
INTRAVENOUS | Status: DC | PRN
  Administered 2019-04-02: 10 mL via INTRA_ARTERIAL

## 2019-04-02 MED ORDER — ONDANSETRON HCL 4 MG/2ML IJ SOLN: 4.0000 mg | Freq: Four times a day (QID) | INTRAMUSCULAR | Status: DC | PRN

## 2019-04-02 MED ORDER — HEPARIN (PORCINE) IN NACL 1000-0.9 UT/500ML-% IV SOLN
INTRAVENOUS | Status: DC | PRN
  Administered 2019-04-02 (×2): 500 mL

## 2019-04-02 MED ORDER — INSULIN ASPART 100 UNIT/ML ~~LOC~~ SOLN
0.0000 [IU] | Freq: Every day | SUBCUTANEOUS | Status: DC
  Administered 2019-04-02: 4 [IU] via SUBCUTANEOUS
  Administered 2019-04-03: 3 [IU] via SUBCUTANEOUS

## 2019-04-02 MED ORDER — INSULIN ASPART 100 UNIT/ML ~~LOC~~ SOLN
0.0000 [IU] | Freq: Three times a day (TID) | SUBCUTANEOUS | Status: DC
  Administered 2019-04-03: 5 [IU] via SUBCUTANEOUS
  Administered 2019-04-03: 15 [IU] via SUBCUTANEOUS
  Administered 2019-04-03: 5 [IU] via SUBCUTANEOUS
  Administered 2019-04-04: 13:00:00 11 [IU] via SUBCUTANEOUS
  Administered 2019-04-04: 8 [IU] via SUBCUTANEOUS

## 2019-04-02 MED ORDER — TICAGRELOR 90 MG PO TABS
ORAL_TABLET | ORAL | Status: DC | PRN
  Administered 2019-04-02: 180 mg via ORAL

## 2019-04-02 MED ORDER — LABETALOL HCL 5 MG/ML IV SOLN
10.0000 mg | INTRAVENOUS | Status: DC | PRN
  Administered 2019-04-03: 10 mg via INTRAVENOUS
  Filled 2019-04-02: qty 4

## 2019-04-02 MED ORDER — SODIUM CHLORIDE 0.9% FLUSH
3.0000 mL | Freq: Two times a day (BID) | INTRAVENOUS | Status: DC
  Administered 2019-04-03 – 2019-04-04 (×2): 3 mL via INTRAVENOUS

## 2019-04-02 MED ORDER — HEPARIN SODIUM (PORCINE) 1000 UNIT/ML IJ SOLN
INTRAMUSCULAR | Status: DC | PRN
  Administered 2019-04-02: 7000 [IU] via INTRAVENOUS
  Administered 2019-04-02: 4000 [IU] via INTRAVENOUS

## 2019-04-02 MED ORDER — IOHEXOL 350 MG/ML SOLN
INTRAVENOUS | Status: DC | PRN
  Administered 2019-04-02: 200 mL

## 2019-04-02 MED ORDER — TICAGRELOR 90 MG PO TABS
90.0000 mg | ORAL_TABLET | Freq: Two times a day (BID) | ORAL | Status: DC
  Administered 2019-04-03 – 2019-04-04 (×3): 90 mg via ORAL
  Filled 2019-04-02 (×4): qty 1

## 2019-04-02 MED ORDER — HEPARIN SODIUM (PORCINE) 5000 UNIT/ML IJ SOLN
5000.0000 [IU] | Freq: Three times a day (TID) | INTRAMUSCULAR | Status: DC
  Administered 2019-04-03 – 2019-04-04 (×5): 5000 [IU] via SUBCUTANEOUS
  Filled 2019-04-02 (×5): qty 1

## 2019-04-02 MED ORDER — FENTANYL CITRATE (PF) 100 MCG/2ML IJ SOLN
INTRAMUSCULAR | Status: DC | PRN
  Administered 2019-04-02 (×2): 25 ug via INTRAVENOUS

## 2019-04-02 SURGICAL SUPPLY — 24 items
BALLN SAPPHIRE 2.5X15 (BALLOONS) ×2
BALLN SAPPHIRE ~~LOC~~ 3.0X18 (BALLOONS) ×2 IMPLANT
BALLOON SAPPHIRE 2.5X15 (BALLOONS) ×1 IMPLANT
CATH 5FR JL3.5 JR4 ANG PIG MP (CATHETERS) ×2 IMPLANT
CATH LAUNCHER 5F JR4 (CATHETERS) ×2 IMPLANT
CATH LAUNCHER 6FR EBU3.5 (CATHETERS) ×2 IMPLANT
CATH VISTA GUIDE 6FR XBLAD3.5 (CATHETERS) ×2 IMPLANT
DEVICE CLOSURE PERCLS PRGLD 6F (VASCULAR PRODUCTS) ×1 IMPLANT
DEVICE RAD COMP TR BAND LRG (VASCULAR PRODUCTS) ×2 IMPLANT
GLIDESHEATH SLEND SS 6F .021 (SHEATH) ×2 IMPLANT
GUIDEWIRE INQWIRE 1.5J.035X260 (WIRE) ×1 IMPLANT
INQWIRE 1.5J .035X260CM (WIRE) ×2
KIT ENCORE 26 ADVANTAGE (KITS) ×2 IMPLANT
KIT HEART LEFT (KITS) ×2 IMPLANT
PACK CARDIAC CATHETERIZATION (CUSTOM PROCEDURE TRAY) ×2 IMPLANT
PERCLOSE PROGLIDE 6F (VASCULAR PRODUCTS) ×2
SHEATH PINNACLE 6F 10CM (SHEATH) ×2 IMPLANT
SHEATH PROBE COVER 6X72 (BAG) ×2 IMPLANT
STENT RESOLUTE ONYX 2.5X38 (Permanent Stent) ×2 IMPLANT
SYR MEDRAD MARK 7 150ML (SYRINGE) ×2 IMPLANT
TRANSDUCER W/STOPCOCK (MISCELLANEOUS) ×2 IMPLANT
TUBING CIL FLEX 10 FLL-RA (TUBING) ×2 IMPLANT
WIRE COUGAR XT STRL 190CM (WIRE) ×2 IMPLANT
WIRE EMERALD 3MM-J .035X150CM (WIRE) ×2 IMPLANT

## 2019-04-02 NOTE — Progress Notes (Signed)
Nurse noticed at 2145 that some blood leaked through gauze at right femoral site. Nurse reassessed at 2200 and noticed gauze were soaked. Nurse held manual pressure from 2200 until 2220. Bleeding was stopped. Site is still a level 2 with a hematoma approx. 3-4cm at the groin. Will continue to assess and monitor for changes.

## 2019-04-02 NOTE — ED Notes (Signed)
To cath lab now 

## 2019-04-02 NOTE — ED Provider Notes (Signed)
[facility] Provider Note   CSN: [id number] Arrival date & time: 04/02/19  1809     History Chief Complaint  Patient presents with  . Code STEMI    Darrell [name] is a 69 y.o. male.  HPI 69 year old male presenting to the ED as a code STEMI.  Patient has a history of nonobstructive CAD, depression, aortic stenosis, with recent valve replacement, states that he had centralized chest pain, nonradiating, nonexertional today when he was eating popcorn at home.  Patient states that he took 4 baby aspirin and the pain went from an 8 to a 10.  When EMS arrived, they obtained a 12-lead, and code STEMI was activated in route.  On initial EKG patient had ST elevations in the anterior leads, as well as 1 and aVL, with depressions noted in 2 3 and aVF.  On arrival to the ED patient was hemodynamically stable, afebrile and well-appearing.  Hypertensive but otherwise vital signs are reassuring.  Patient denies any nausea or vomiting, no diaphoresis, denies any current chest pain, no headaches or neurological symptoms, denies any recent illnesses or fevers.  No history of heart attack or stroke in the past as well.  Patient had a cath a few years ago with nonobstructive disease.    Past Medical History:  Diagnosis Date  . Aortic stenosis 05/2011   Severe by recent echo; AoV area ~0.77-79 cm2, peak /mean gradient 86 mmHg/ 55 mmHg.  . Arthritis    back- low- [facility]- workmen's comp. situation   . DDD (degenerative disc disease), lumbosacral   . Depression    quick temper- per pt.   . Diverticulosis    slight -per colonoscopy, pt. asymptomatic- thus far  . GERD (gastroesophageal reflux disease)   . Hiatal hernia   . History of BPH   . History of MRSA infection   . Hyperlipidemia   . Hypertension    [facility]- Dr. Ellyn Hack   . Insomnia   . Myocardial infarction ([place])    By report only, cardiac catheterization was negative for ischemia.  Marland Kitchen NIDDM  (non-insulin dependent diabetes mellitus)    Type 2 NIDDM x 5 years  . Prostate disease   . Recurrent upper respiratory infection (URI)    cold- curently- "per pt., on the mend"  . S/P AVR (aortic valve replacement) 08/05/2011   Preop cath showed normal coronaries; AVR--Dr. Servando Snare, MD; 23 mm Magna Ease bioprosthetic Aortic Valve    Patient Active Problem List   Diagnosis Date Noted  . Vitamin D deficiency 03/13/2015  . DDD (degenerative disc disease), lumbar 04/01/2012  . Degenerative disc disease, lumbar 03/24/2012  . MRSA cellulitis 10/20/2011  . S/P AVR (aortic valve replacement) - for severe AS;  08/05/2011  . GERD (gastroesophageal reflux disease)   . Insomnia   . Type 2 diabetes mellitus (HCC)     Class: Diagnosis of  . History of BPH   . ADENOMATOUS COLONIC POLYP 07/08/2007  . HYPERLIPIDEMIA 07/08/2007    Class: Diagnosis of  . DEPRESSION 07/08/2007  . HYPERTENSION 07/08/2007    Class: Diagnosis of  . DIVERTICULOSIS 07/08/2007  . ABDOMINAL PAIN-LUQ 07/08/2007    Past Surgical History:  Procedure Laterality Date  . ABDOMINAL EXPOSURE N/A 04/01/2012   Procedure: ABDOMINAL EXPOSURE;  Surgeon: Angelia Mould, MD;  Location: [facility];  Service: Vascular;  Laterality: N/A;  . ANTERIOR LUMBAR FUSION N/A 04/01/2012   Procedure: ANTERIOR LUMBAR FUSION 1 LEVEL;  Surgeon: [name], MD;  Location: [facility];  Service: Orthopedics;  Laterality: N/A;  ALIF L5-S1  . AORTIC VALVE REPLACEMENT  08/05/2011   Procedure: AORTIC VALVE REPLACEMENT (AVR);  Surgeon: [name], MD;  Location: [facility];  Service: Open Heart Surgery;  Laterality: N/A;  . CARDIAC CATHETERIZATION  06/2011    Minimal coronary disease  . LEFT AND RIGHT HEART CATHETERIZATION WITH CORONARY ANGIOGRAM  07/03/2011   Procedure: LEFT AND RIGHT HEART CATHETERIZATION WITH CORONARY ANGIOGRAM;  Surgeon: [name], MD;  Location: [facility];  Service: Cardiovascular;;  . LUMBAR FUSION  04/02/2012   Dr [name]  .  NASAL SEPTUM SURGERY     [facility]- 10 yrs. ago  . NM MYOVIEW LTD  April 2013   Low risk.       Family History  Problem Relation Age of Onset  . Heart disease Mother   . Heart attack Brother   . Anesthesia problems Neg Hx     Social History   Tobacco Use  . Smoking status: Never Smoker  . Smokeless tobacco: Never Used  Substance Use Topics  . Alcohol use: No  . Drug use: No    Home Medications Prior to Admission medications   Medication Sig Start Date End Date Taking? Authorizing Provider  amitriptyline (ELAVIL) 25 MG tablet TAKE 1 TABLET BY MOUTH AT BEDTIME 09/17/18   [name], MD  atorvastatin (LIPITOR) 40 MG tablet Take 40 mg by mouth daily.    [provider]  glipiZIDE (GLUCOTROL XL) 10 MG 24 hr tablet TAKE 1 TABLET BY MOUTH EVERY DAY WITH BREAKFAST 10/25/18   [name], MD  GLYXAMBI 25-5 MG TABS TAKE 1 TABLET BY MOUTH EVERY DAY 08/30/18   [name], MD  metFORMIN (GLUCOPHAGE) 1000 MG tablet Take 1 tablet (1,000 mg total) by mouth 2 (two) times daily with a meal. Patient not taking: Reported on 10/25/2018 12/18/15   [name], MD  metoprolol tartrate (LOPRESSOR) 25 MG tablet Take 25 mg by mouth 2 (two) times daily.    [provider]  neomycin-polymyxin-hydrocortisone (CORTISPORIN) OTIC solution Place 4 drops into the right ear 4 (four) times daily. 10/25/18   [name], MD  valACYclovir (VALTREX) 1000 MG tablet Take 1 tablet (1,000 mg total) by mouth 3 (three) times daily. 10/25/18   [name], MD  venlafaxine XR (EFFEXOR-XR) 150 MG 24 hr capsule TAKE ONE CAPSULE BY MOUTH AT BEDTIME 08/30/18   [name], MD    Allergies    Tape  Review of Systems   Review of Systems  Constitutional: Negative for chills and fever.  HENT: Negative for ear pain and sore throat.   Eyes: Negative for pain and visual disturbance.  Respiratory: Negative for cough and shortness of breath.   Cardiovascular:  Positive for chest pain. Negative for palpitations.  Gastrointestinal: Negative for abdominal pain and vomiting.  Genitourinary: Negative for dysuria and hematuria.  Musculoskeletal: Negative for arthralgias and back pain.  Skin: Negative for color change and rash.  Neurological: Negative for seizures and syncope.  All other systems reviewed and are negative.   Physical Exam Updated Vital Signs BP (!) 180/91   Pulse 92   Temp 98.1 F (36.7 C) (Temporal)   Resp 13   SpO2 97%   Physical Exam Vitals and nursing note reviewed.  Constitutional:      General: He is not in acute distress.    Appearance: Normal appearance. He is well-developed. He is  not ill-appearing, toxic-appearing or diaphoretic.  HENT:     Head: Normocephalic and atraumatic.     Right Ear: External ear normal.     Left Ear: External ear normal.     Nose: Nose normal. No congestion.     Mouth/Throat:     Mouth: Mucous membranes are moist.     Pharynx: Oropharynx is clear.  Eyes:     Conjunctiva/sclera: Conjunctivae normal.  Cardiovascular:     Rate and Rhythm: Normal rate and regular rhythm.     Heart sounds: No murmur.  Pulmonary:     Effort: Pulmonary effort is normal. No respiratory distress.     Breath sounds: Normal breath sounds.  Abdominal:     Palpations: Abdomen is soft.     Tenderness: There is no abdominal tenderness. There is no guarding or rebound.     Hernia: No hernia is present.  Musculoskeletal:        General: No swelling or tenderness. Normal range of motion.     Cervical back: Normal range of motion and neck supple.  Skin:    General: Skin is warm and dry.     Capillary Refill: Capillary refill takes less than 2 seconds.  Neurological:     General: No focal deficit present.     Mental Status: He is alert and oriented to person, place, and time. Mental status is at baseline.  Psychiatric:        Mood and Affect: Mood normal.        Behavior: Behavior normal.     ED Results /  Procedures / Treatments   Labs (all labs ordered are listed, but only abnormal results are displayed) Labs Reviewed  RESPIRATORY PANEL BY RT PCR (FLU A&B, COVID)    EKG None  Radiology No results found.  Procedures Procedures (including critical care time)  Medications Ordered in ED Medications - No data to display  ED Course  I have reviewed the triage vital signs and the nursing notes.  Pertinent labs & imaging results that were available during my care of the patient were reviewed by me and considered in my medical decision making (see chart for details).    MDM Rules/Calculators/A&P                      69 year old male presenting with code STEMI.  Anterior lateral distribution on initial EKG.  Patient was given aspirin, patient arrived she is hemodynamically stable, afebrile and well-appearing.  Patient denies any current chest pain.  Cardiology was bedside as well, Cath Lab was activated, patient will be taken to the Cath Lab for emergent intervention.  Patient had reciprocal changes in the inferior leads with ST elevations in the anterior lateral leads.  Cath Lab was activated, heparin bolus will be started in the Cath Lab.  Transferred in stable condition.  The attending physician was present and available for all medical decision making and procedures related to this patient's care.  Final Clinical Impression(s) / ED Diagnoses Final diagnoses:  STEMI (ST elevation myocardial infarction) [facility])    Rx / [place] Orders ED Discharge Orders    None       Kizzie Fantasia, MD 04/02/19 Patrina Levering, MD 04/03/19 [place], [place], MD 04/03/19 1535

## 2019-04-02 NOTE — Progress Notes (Signed)
Wife, daughter and sister called and updated on patient status at patients request. Visitation policy communicated with family.

## 2019-04-02 NOTE — ED Triage Notes (Signed)
Pt BIB [facility] from home, c/o chest pain after eating popcorn this evening. Pt took 4 baby aspirin, pain went from 8/10 to 2/10. EMS activated Code STEMI en route.

## 2019-04-03 ENCOUNTER — Inpatient Hospital Stay (HOSPITAL_COMMUNITY): Payer: Medicare Other

## 2019-04-03 DIAGNOSIS — I10 Essential (primary) hypertension: Secondary | ICD-10-CM

## 2019-04-03 DIAGNOSIS — E08649 Diabetes mellitus due to underlying condition with hypoglycemia without coma: Secondary | ICD-10-CM

## 2019-04-03 DIAGNOSIS — E785 Hyperlipidemia, unspecified: Secondary | ICD-10-CM

## 2019-04-03 DIAGNOSIS — R079 Chest pain, unspecified: Secondary | ICD-10-CM

## 2019-04-03 DIAGNOSIS — I2102 ST elevation (STEMI) myocardial infarction involving left anterior descending coronary artery: Principal | ICD-10-CM

## 2019-04-03 DIAGNOSIS — E1169 Type 2 diabetes mellitus with other specified complication: Secondary | ICD-10-CM

## 2019-04-03 LAB — BASIC METABOLIC PANEL
Anion gap: 12 (ref 5–15)
BUN: 11 mg/dL (ref 8–23)
CO2: 20 mmol/L — ABNORMAL LOW (ref 22–32)
Calcium: 8.5 mg/dL — ABNORMAL LOW (ref 8.9–10.3)
Chloride: 98 mmol/L (ref 98–111)
Creatinine, Ser: 0.85 mg/dL (ref 0.61–1.24)
GFR calc Af Amer: >60 mL/min (ref >60)
GFR calc non Af Amer: >60 mL/min (ref >60)
Glucose, Bld: 380 mg/dL — ABNORMAL HIGH (ref 70–99)
Potassium: 4.2 mmol/L (ref 3.5–5.1)
Sodium: 130 mmol/L — ABNORMAL LOW (ref 135–145)

## 2019-04-03 LAB — GLUCOSE, CAPILLARY
Glucose-Capillary: 288 mg/dL — ABNORMAL HIGH (ref 70–99)
Glucose-Capillary: 357 mg/dL — ABNORMAL HIGH (ref 70–99)
Glucose-Capillary: 268 mg/dL — ABNORMAL HIGH (ref 70–99)
Glucose-Capillary: 240 mg/dL — ABNORMAL HIGH (ref 70–99)
Glucose-Capillary: 278 mg/dL — ABNORMAL HIGH (ref 70–99)

## 2019-04-03 LAB — CBC
HCT: 39.7 % (ref 39.0–52.0)
Hemoglobin: 13.6 g/dL (ref 13.0–17.0)
MCH: 32.4 pg (ref 26.0–34.0)
MCHC: 34.3 g/dL (ref 30.0–36.0)
MCV: 94.5 fL (ref 80.0–100.0)
Platelets: 192 10*3/uL (ref 150–400)
RBC: 4.2 MIL/uL — ABNORMAL LOW (ref 4.22–5.81)
RDW: 12.6 % (ref 11.5–15.5)
WBC: 10.1 10*3/uL (ref 4.0–10.5)
nRBC: 0 % (ref 0.0–0.2)

## 2019-04-03 LAB — MRSA PCR SCREENING: MRSA by PCR: NEGATIVE

## 2019-04-03 LAB — LIPID PANEL
Cholesterol: 219 mg/dL — ABNORMAL HIGH (ref 0–200)
HDL: 27 mg/dL — ABNORMAL LOW (ref >40)
LDL Cholesterol: 115 mg/dL — ABNORMAL HIGH (ref 0–99)
Total CHOL/HDL Ratio: 8.1 RATIO
Triglycerides: 384 mg/dL — ABNORMAL HIGH (ref <150)
VLDL: 77 mg/dL — ABNORMAL HIGH (ref 0–40)

## 2019-04-03 LAB — ECHOCARDIOGRAM COMPLETE: Weight: 2670.21 oz

## 2019-04-03 LAB — HIV ANTIBODY (ROUTINE TESTING W REFLEX): HIV Screen 4th Generation wRfx: NONREACTIVE

## 2019-04-03 MED ORDER — ATORVASTATIN CALCIUM 40 MG PO TABS: 40.0000 mg | ORAL_TABLET | Freq: Every day | ORAL | Status: DC

## 2019-04-03 MED ORDER — CHLORHEXIDINE GLUCONATE CLOTH 2 % EX PADS
6.0000 | MEDICATED_PAD | Freq: Every day | CUTANEOUS | Status: DC
  Administered 2019-04-03: 6 via TOPICAL

## 2019-04-03 MED ORDER — INSULIN GLARGINE 100 UNIT/ML ~~LOC~~ SOLN
10.0000 [IU] | Freq: Every day | SUBCUTANEOUS | Status: DC
  Administered 2019-04-03: 10 [IU] via SUBCUTANEOUS
  Filled 2019-04-03 (×3): qty 0.1

## 2019-04-03 NOTE — Progress Notes (Signed)
  Echocardiogram 2D Echocardiogram has been performed.  Darrell [name] 04/03/2019, 2:21 PM

## 2019-04-03 NOTE — Progress Notes (Signed)
DAILY PROGRESS NOTE   Patient Name: Darrell [name] Date of Encounter: 04/03/2019 Cardiologist: No primary care provider on file.  Chief Complaint   No chest pain  Patient Profile   69 yo male with anterior STEMI, s/p PCI of tandem LAD lesions overnight, initially COVID negative by rapid test, now COVID positive by PCR  Subjective   Spoke with patient via telephone conference to reduce use of PPE and exposure.  No further chest pain. Says he is hungry today. Bruising around the radial cath site, no groin pain. COVID PCR was positive, now on isolation. Labs stable today. Lipids are elevated with LDL 115 and trigs 384, glucose 380, A1c 11.6. EKG today shows evolving anteroseptal MI with residual 3 mm anteroseptal ST elevation and TWI's. Mild inferior ST changes are noted.   Objective   Vitals:   04/03/19 0635 04/03/19 0700 04/03/19 0753 04/03/19 0800  BP: 135/72 125/80  (!) 147/77  Pulse: 85 84 89 86  Resp: 19 14 14  (!) 21  Temp:      TempSrc:      SpO2: 95% 96%  94%  Weight:        Intake/Output Summary (Last 24 hours) at 04/03/2019 0908 Last data filed at 04/03/2019 0500 Gross per 24 hour  Intake 680 ml  Output 735 ml  Net -55 ml   Filed Weights   04/03/19 0000  Weight: 75.7 kg    Physical Exam   Due to the current COVID-19 pandemic and the desire to minimize use of PPE and reduce provider exposure, the patient exam was deferred or conducted via video or teleconference.  Inpatient Medications    Scheduled Meds: . amitriptyline  25 mg Oral QHS  . aspirin EC  81 mg Oral Daily  . atorvastatin  40 mg Oral Daily  . Chlorhexidine Gluconate Cloth  6 each Topical Daily  . glipiZIDE  5 mg Oral Q breakfast  . heparin  5,000 Units Subcutaneous Q8H  . insulin aspart  0-15 Units Subcutaneous TID WC  . insulin aspart  0-5 Units Subcutaneous QHS  . metoprolol tartrate  25 mg Oral BID  . sodium chloride flush  3 mL Intravenous Q12H  . ticagrelor  90 mg Oral BID  .  venlafaxine XR  150 mg Oral QHS    Continuous Infusions: . sodium chloride      PRN Meds: sodium chloride, acetaminophen, nitroGLYCERIN, ondansetron (ZOFRAN) IV, sodium chloride flush   Labs   Results for orders placed or performed during the hospital encounter of 04/02/19 (from the past 48 hour(s))  Respiratory Panel by RT PCR (Flu A&B, Covid) - Nasopharyngeal Swab     Status: Abnormal   Collection Time: 04/02/19  6:15 PM   Specimen: Nasopharyngeal Swab  Result Value Ref Range   SARS Coronavirus 2 by RT PCR POSITIVE (A) NEGATIVE    Comment: RESULT CALLED TO, READ BACK BY AND VERIFIED WITH: RN A KOTEY AT 1957 04/02/19 BY L BENFIELD    Influenza A by PCR NEGATIVE NEGATIVE   Influenza B by PCR NEGATIVE NEGATIVE    Comment: Performed at [facility], [place] [address], [place], [place] [address]  Comprehensive metabolic panel     Status: Abnormal   Collection Time: 04/02/19  6:37 PM  Result Value Ref Range   Sodium 131 (L) 135 - 145 mmol/L   Potassium 4.3 3.5 - 5.1 mmol/L   Chloride 99 98 - 111 mmol/L   CO2 20 (L) 22 -  32 mmol/L   Glucose, Bld 371 (H) 70 - 99 mg/dL   BUN 13 8 - 23 mg/dL   Creatinine, Ser 0.96 0.61 - 1.24 mg/dL   Calcium 8.5 (L) 8.9 - 10.3 mg/dL   Total Protein 7.0 6.5 - 8.1 g/dL   Albumin 3.0 (L) 3.5 - 5.0 g/dL   AST 73 (H) 15 - 41 U/L   ALT 25 0 - 44 U/L   Alkaline Phosphatase 63 38 - 126 U/L   Total Bilirubin 0.6 0.3 - 1.2 mg/dL   GFR calc non Af Amer >60 >60 mL/min   GFR calc Af Amer >60 >60 mL/min   Anion gap 12 5 - 15    Comment: Performed at [facility] [address], [place], [place] [address]  Lipid panel     Status: Abnormal   Collection Time: 04/02/19  6:37 PM  Result Value Ref Range   Cholesterol 245 (H) 0 - 200 mg/dL   Triglycerides 371 (H) <150 mg/dL   HDL 32 (L) >40 mg/dL   Total CHOL/HDL Ratio 7.7 RATIO   VLDL 74 (H) 0 - 40 mg/dL   LDL Cholesterol 139 (H) 0 - 99 mg/dL    Comment:        Total Cholesterol/HDL:CHD  Risk Coronary Heart Disease Risk Table                     Men   Women  1/2 Average Risk   3.4   3.3  Average Risk       5.0   4.4  2 X Average Risk   9.6   7.1  3 X Average Risk  23.4   11.0        Use the calculated Patient Ratio above and the CHD Risk Table to determine the patient's CHD Risk.        ATP III CLASSIFICATION (LDL):  <100     mg/dL   Optimal  100-129  mg/dL   Near or Above                    Optimal  130-159  mg/dL   Borderline  160-189  mg/dL   High  >190     mg/dL   Very High Performed at [facility] [address], [place], [place] [address]   Hemoglobin A1c     Status: Abnormal   Collection Time: 04/02/19  6:37 PM  Result Value Ref Range   Hgb A1c MFr Bld 11.6 (H) 4.8 - 5.6 %    Comment: (NOTE) Pre diabetes:          5.7%-6.4% Diabetes:              >6.4% Glycemic control for   <7.0% adults with diabetes    Mean Plasma Glucose 286.22 mg/dL    Comment: Performed at [facility] [address], [place] [address]  CBC     Status: None   Collection Time: 04/02/19  6:37 PM  Result Value Ref Range   WBC 10.0 4.0 - 10.5 K/uL   RBC 4.28 4.22 - 5.81 MIL/uL   Hemoglobin 14.0 13.0 - 17.0 g/dL   HCT 40.1 39.0 - 52.0 %   MCV 93.7 80.0 - 100.0 fL   MCH 32.7 26.0 - 34.0 pg   MCHC 34.9 30.0 - 36.0 g/dL   RDW 12.4 11.5 - 15.5 %   Platelets 189 150 - 400 K/uL  nRBC 0.0 0.0 - 0.2 %    Comment: Performed at [facility], [place] [address], [place], [place] [address]  Protime-INR     Status: None   Collection Time: 04/02/19  6:37 PM  Result Value Ref Range   Prothrombin Time 13.5 11.4 - 15.2 seconds   INR 1.0 0.8 - 1.2    Comment: (NOTE) INR goal varies based on device and disease states. Performed at [facility], [place] [address], [place], [place] [address]   APTT     Status: None   Collection Time: 04/02/19  6:37 PM  Result Value Ref Range   aPTT 32 24 - 36 seconds    Comment: Performed at [facility]  [address] [place], [place] [address]  Troponin I (High Sensitivity)     Status: Abnormal   Collection Time: 04/02/19  6:37 PM  Result Value Ref Range   Troponin I (High Sensitivity) 4,532 (HH) <18 ng/L    Comment: CRITICAL RESULT CALLED TO, READ BACK BY AND VERIFIED WITH: A.[place] [id number] I.MANNING (NOTE) Elevated high sensitivity troponin I (hsTnI) values and significant  changes across serial measurements may suggest ACS but many other  chronic and acute conditions are known to elevate hsTnI results.  Refer to the Links section for chest pain algorithms and additional  guidance. Performed at [facility], [place] [address], [place], [place] [address]   MRSA PCR Screening     Status: None   Collection Time: 04/02/19  8:10 PM   Specimen: Nasal Mucosa; Nasopharyngeal  Result Value Ref Range   MRSA by PCR NEGATIVE NEGATIVE    Comment:        The GeneXpert MRSA Assay (FDA approved for NASAL specimens only), is one component of a comprehensive MRSA colonization surveillance program. It is not intended to diagnose MRSA infection nor to guide or monitor treatment for MRSA infections. Performed at [facility], [place] [address] [place], [place] [address]   Glucose, capillary     Status: Abnormal   Collection Time: 04/02/19 11:16 PM  Result Value Ref Range   Glucose-Capillary 311 (H) 70 - 99 mg/dL  Glucose, capillary     Status: Abnormal   Collection Time: 04/03/19  1:07 AM  Result Value Ref Range   Glucose-Capillary 288 (H) 70 - 99 mg/dL  Lipid panel     Status: Abnormal   Collection Time: 04/03/19  6:58 AM  Result Value Ref Range   Cholesterol 219 (H) 0 - 200 mg/dL   Triglycerides 384 (H) <150 mg/dL   HDL 27 (L) >40 mg/dL   Total CHOL/HDL Ratio 8.1 RATIO   VLDL 77 (H) 0 - 40 mg/dL   LDL Cholesterol 115 (H) 0 - 99 mg/dL    Comment:        Total Cholesterol/HDL:CHD Risk Coronary Heart Disease Risk Table                     Men   Women  1/2 Average Risk   3.4    3.3  Average Risk       5.0   4.4  2 X Average Risk   9.6   7.1  3 X Average Risk  23.4   11.0        Use the calculated Patient Ratio above and the CHD Risk Table to determine the patient's CHD Risk.        ATP III CLASSIFICATION (LDL):  <  100     mg/dL   Optimal  100-129  mg/dL   Near or Above                    Optimal  130-159  mg/dL   Borderline  160-189  mg/dL   High  >190     mg/dL   Very High Performed at [facility] [address], [place], [place] [id number]   Basic metabolic panel     Status: Abnormal   Collection Time: 04/03/19  6:58 AM  Result Value Ref Range   Sodium 130 (L) 135 - 145 mmol/L   Potassium 4.2 3.5 - 5.1 mmol/L   Chloride 98 98 - 111 mmol/L   CO2 20 (L) 22 - 32 mmol/L   Glucose, Bld 380 (H) 70 - 99 mg/dL   BUN 11 8 - 23 mg/dL   Creatinine, Ser 0.85 0.61 - 1.24 mg/dL   Calcium 8.5 (L) 8.9 - 10.3 mg/dL   GFR calc non Af Amer >60 >60 mL/min   GFR calc Af Amer >60 >60 mL/min   Anion gap 12 5 - 15    Comment: Performed at [facility], [place] [address], [place], [place] [address]  CBC     Status: Abnormal   Collection Time: 04/03/19  6:58 AM  Result Value Ref Range   WBC 10.1 4.0 - 10.5 K/uL   RBC 4.20 (L) 4.22 - 5.81 MIL/uL   Hemoglobin 13.6 13.0 - 17.0 g/dL   HCT 39.7 39.0 - 52.0 %   MCV 94.5 80.0 - 100.0 fL   MCH 32.4 26.0 - 34.0 pg   MCHC 34.3 30.0 - 36.0 g/dL   RDW 12.6 11.5 - 15.5 %   Platelets 192 150 - 400 K/uL   nRBC 0.0 0.0 - 0.2 %    Comment: Performed at [facility], [place] [address] [place], [place] [address]    ECG   Sinus with anteroseptal 3 mm ST elevation and TWI's - Personally Reviewed  Telemetry   Sinus rhythm with PVC's overnight - Personally Reviewed  Radiology    CARDIAC CATHETERIZATION  Result Date: 04/02/2019 1.  Severe single-vessel coronary artery disease with critical stenosis of the mid LAD, tandem lesions treated with a long 2.5 x 38 mm resolute Onyx DES 2.  Patent left main and left  circumflex without significant stenosis 3.  Moderate mid RCA stenosis, does not appear flow obstructive 4.  Mild segmental LV contraction abnormality with hypokinesis of the distal anterior wall and anteroapex, LVEF estimated at 50 to 55% Recommendation: Dual antiplatelet therapy with aspirin and ticagrelor x12 months without interruption.  Aggressive risk reduction measures.  Medical therapy for residual CAD.  If no complications arise, anticipate hospital discharge in 48 hours.   Cardiac Studies   Echo pending  Assessment   1. Active Problems: 2.   Essential hypertension 3.   Acute ST elevation myocardial infarction (STEMI) involving left anterior descending (LAD) coronary artery ([place]) 4.   ST elevation myocardial infarction involving left anterior descending (LAD) coronary artery ([place]) 5.   Hyperlipidemia associated with type 2 diabetes mellitus ([place]) 6.   Plan   1. Mr. Kirchhoff feels much better after PCI yesterday. It was recommended to optimize medical therapy for residual moderate RCA stenosis. LVEF was low normal. Echo pending today. Needs better glycemic control. Start glargine 10U QHS with SSI and continue glipizide. Ok to transfer to [facility] after discussing with charge nurse  to negative pressure COVID isolation room.  Time Spent Directly with Patient:  I have spent a total of 35 minutes with the patient reviewing hospital notes, telemetry, EKGs, labs and examining the patient as well as establishing an assessment and plan that was discussed personally with the patient.  > 50% of time was spent in direct patient care.  Length of Stay:  LOS: 1 day   Pixie Casino, MD, [facility], [place] Director of the Advanced Lipid Disorders &  Cardiovascular Risk Reduction Clinic Diplomate of the American Board of Clinical Lipidology Attending Cardiologist  Direct Dial: [phone]  Fax: [phone]  Website:  [email or web address]  Dalonda Simoni 04/03/2019, 9:08 AM

## 2019-04-03 NOTE — Progress Notes (Signed)
EKG CRITICAL VALUE     12 lead EKG performed.  Critical value noted. Pauline Good, RN notified.   Warren Lacy, CCT 04/03/2019 9:17 AM

## 2019-04-03 NOTE — H&P (Signed)
Cardiology Admission History and Physical:   Patient ID: Darrell [name] MRN: [id number]; DOB: August 08, 1950   Admission date: 04/02/2019  Primary Care Provider: [name], MD Primary Cardiologist: No primary care provider on file.  Primary Electrophysiologist:  None   Chief Complaint:  Chest pain  Patient Profile:   Darrell [name] is a 69 y.o. male with bioprosthetic aortic valve (Edwards 23 mm 2013), HTN, HLD, and T2DM presenting with acute onset chest pressure and admitted for STEMI s/p PCI to the LAD.   History of Present Illness:   Darrell [name] was watching TV earlier this evening when he developed sudden onset substernal chest pressure. He took 4 baby aspirin and called EMS. ECG on arrival showed STE in anterior leads and code STEMI was activated. On arrival to the ED, he was alert and calm with 2/10 chest pain and mild hypertension. He was taken emergently to the cath lab when coronary angiography showed severe tandem lesions of the mid LAD and moderate mid RCA occlusion. He received PCI to the LAD. Ventriculogram showed EF ~50% with hypokinesis of anterior wall. Access was initially R radial but due to tortuous aorta, femoral access was obtained.   Of note, rapid COVID screen had been negative, but PCR test on admission was positive. He was placed in a negative pressure room in the CCU.   Heart Pathway Score:     Past Medical History:  Diagnosis Date  . Aortic stenosis 05/2011   Severe by recent echo; AoV area ~0.77-79 cm2, peak /mean gradient 86 mmHg/ 55 mmHg.  . Arthritis    back- low- [facility]- workmen's comp. situation   . DDD (degenerative disc disease), lumbosacral   . Depression    quick temper- per pt.   . Diverticulosis    slight -per colonoscopy, pt. asymptomatic- thus far  . GERD (gastroesophageal reflux disease)   . Hiatal hernia   . History of BPH   . History of MRSA infection   . Hyperlipidemia   . Hypertension    [facility]- Dr. Ellyn Hack   . Insomnia    . Myocardial infarction ([place])    By report only, cardiac catheterization was negative for ischemia.  Marland Kitchen NIDDM (non-insulin dependent diabetes mellitus)    Type 2 NIDDM x 5 years  . Prostate disease   . Recurrent upper respiratory infection (URI)    cold- curently- "per pt., on the mend"  . S/P AVR (aortic valve replacement) 08/05/2011   Preop cath showed normal coronaries; AVR--Dr. Servando Snare, MD; 23 mm Magna Ease bioprosthetic Aortic Valve    Past Surgical History:  Procedure Laterality Date  . ABDOMINAL EXPOSURE N/A 04/01/2012   Procedure: ABDOMINAL EXPOSURE;  Surgeon: Angelia Mould, MD;  Location: [facility];  Service: Vascular;  Laterality: N/A;  . ANTERIOR LUMBAR FUSION N/A 04/01/2012   Procedure: ANTERIOR LUMBAR FUSION 1 LEVEL;  Surgeon: [name], MD;  Location: [facility];  Service: Orthopedics;  Laterality: N/A;  ALIF L5-S1  . AORTIC VALVE REPLACEMENT  08/05/2011   Procedure: AORTIC VALVE REPLACEMENT (AVR);  Surgeon: [name], MD;  Location: [facility];  Service: Open Heart Surgery;  Laterality: N/A;  . CARDIAC CATHETERIZATION  06/2011    Minimal coronary disease  . LEFT AND RIGHT HEART CATHETERIZATION WITH CORONARY ANGIOGRAM  07/03/2011   Procedure: LEFT AND RIGHT HEART CATHETERIZATION WITH CORONARY ANGIOGRAM;  Surgeon: [name], MD;  Location: [facility];  Service: Cardiovascular;;  . LUMBAR FUSION  04/02/2012   Dr [name]  .  NASAL SEPTUM SURGERY     [facility] 10 [facility]rs[facility]  . NM MYOVIEW LTD  April 2013   Low risk.     Medications Prior to Admission: Prior to Admission medications   Medication Sig Start Date End Date Taking? Authorizing Provider  amitriptyline (ELAVIL) 25 MG tablet TAKE 1 TABLET BY MOUTH AT BEDTIME 09/17/18   [name], MD  atorvastatin (LIPITOR) 40 MG tablet Take 40 mg by mouth daily.    [provider]  glipiZIDE (GLUCOTROL XL) 10 MG 24 hr tablet TAKE 1 TABLET BY MOUTH EVERY DAY WITH BREAKFAST 10/25/18   [name],  MD  GLYXAMBI 25-5 MG TABS TAKE 1 TABLET BY MOUTH EVERY DAY 08/30/18   [name], MD  metFORMIN (GLUCOPHAGE) 1000 MG tablet Take 1 tablet (1,000 mg total) by mouth 2 (two) times daily with a meal. Patient not taking: Reported on 10/25/2018 12/18/15   [name], MD  metoprolol tartrate (LOPRESSOR) 25 MG tablet Take 25 mg by mouth 2 (two) times daily.    [provider]  neomycin-polymyxin-hydrocortisone (CORTISPORIN) OTIC solution Place 4 drops into the right ear 4 (four) times daily. 10/25/18   [name], MD  valACYclovir (VALTREX) 1000 MG tablet Take 1 tablet (1,000 mg total) by mouth 3 (three) times daily. 10/25/18   [name], MD  venlafaxine XR (EFFEXOR-XR) 150 MG 24 hr capsule TAKE ONE CAPSULE BY MOUTH AT BEDTIME 08/30/18   [name], MD     Allergies:    Allergies  Allergen Reactions  . Tape Rash    Social History:   Social History   Socioeconomic History  . Marital status: Married    Spouse name: Not on file  . Number of children: 5  . Years of education: Not on file  . Highest education level: Not on file  Occupational History    Employer: [employer]    Comment: [employer]  Tobacco Use  . Smoking status: Never Smoker  . Smokeless tobacco: Never Used  Substance and Sexual Activity  . Alcohol use: No  . Drug use: No  . Sexual activity: Yes  Other Topics Concern  . 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 HE  HAS 16 GRANDCHILDREN AND 5 GREAT GRAND CHILDREN . HE  EXERCISES  NOW WALKING ABOUT 5-6 TIMES A WEEK ABOUT 30 MINUTES AT A TIME AND HAS NO PROBLEM. HE DOES NOT SMOKE ,DOES NOT DRINK ALCOHOL.   He mostly enjoys going out for walks with his grandkids.           Social Determinants of Health   Financial Resource Strain:   . Difficulty of Paying Living Expenses: Not on file  Food Insecurity:   . Worried About Charity fundraiser in the Last Year:  Not on file  . Ran Out of Food in the Last Year: Not on file  Transportation Needs:   . Lack of Transportation (Medical): Not on file  . Lack of Transportation (Non-Medical): Not on file  Physical Activity:   . Days of Exercise per Week: Not on file  . Minutes of Exercise per Session: Not on file  Stress:   . Feeling of Stress : Not on file  Social Connections:   . Frequency of Communication with Friends and Family: Not on file  . Frequency of Social Gatherings  with Friends and Family: Not on file  . Attends Religious Services: Not on file  . Active Member of Clubs or Organizations: Not on file  . Attends Archivist Meetings: Not on file  . Marital Status: Not on file  Intimate Partner Violence:   . Fear of Current or Ex-Partner: Not on file  . Emotionally Abused: Not on file  . Physically Abused: Not on file  . Sexually Abused: Not on file    Family History:   The patient's family history includes Heart attack in his brother; Heart disease in his mother. There is no history of Anesthesia problems.    ROS:  Please see the history of present illness. All other ROS reviewed and negative.     Physical Exam/Data:   Vitals:   04/03/19 0045 04/03/19 0100 04/03/19 0115 04/03/19 0130  BP: (!) 149/81 (!) 152/96 (!) 162/71 (!) 168/83  Pulse: 75 77 78 79  Resp: 20 15 19  (!) 22  Temp:      TempSrc:      SpO2: 97% 98% 96% 97%  Weight:        Intake/Output Summary (Last 24 hours) at 04/03/2019 0243 Last data filed at 04/03/2019 0140 Gross per 24 hour  Intake 340 ml  Output 510 ml  Net -170 ml   Last 3 Weights 04/03/2019 10/25/2018 04/26/2018  Weight (lbs) 166 lb 14.2 oz 187 lb 190 lb  Weight (kg) 75.7 kg 84.823 kg 86.183 kg     Body mass index is 24.65 kg/m.  General:  Well nourished, well developed, in no acute distress HEENT: normal Lymph: no adenopathy Neck: no JVD Endocrine:  No thryomegaly Vascular: No carotid bruits; FA pulses 2+ bilaterally without bruits   Cardiac:  normal S1, S2; RRR; soft systolic murmur  Lungs:  clear to auscultation bilaterally, no wheezing, rhonchi or rales  Abd: soft, nontender, no hepatomegaly  Ext: no edema Musculoskeletal:  No deformities, BUE and BLE strength normal and equal Skin: warm and dry  Neuro:  CNs 2-12 intact, no focal abnormalities noted Psych:  Normal affect    EKG:  The ECG that was done 04/02/19 at 18:16 was personally reviewed and demonstrates sinus rhythm with STE in V2, V3 and ST depression in inferior and lateral leads, which resolve on ECG at 21:03  Relevant CV Studies:  TTE 11/08/2015 - Left ventricle: The cavity size was normal. Wall thickness was  increased in a pattern of moderate LVH. Systolic function was  vigorous. The estimated ejection fraction was in the range of 65%  to 70%. Wall motion was normal; there were no regional wall  motion abnormalities. Doppler parameters are consistent with  abnormal left ventricular relaxation (grade 1 diastolic  dysfunction).  - Aortic valve: There is an [employer] 23 mm pericardial  tissue valve in the AV position. Peak velocity (S): 280.59 cm/s.  Mean gradient (S): 16 mm Hg. Valve area (VTI): 2.04 cm^2. Peak  velocity ratio of LVOT to aortic valve: 0.38. Valve area (Vmax):  1.57 cm^2. Valve area (Vmean): 1.42 cm^2.  - Aorta: The visualized portion of the ascending aorta is mildly  dilated at 3.8 cm.  - Mitral valve: Mildly calcified annulus. Mildly thickened leaflets   Laboratory Data:  High Sensitivity Troponin:   Recent Labs  Lab 04/02/19 1837  TROPONINIHS 4,532*      Chemistry Recent Labs  Lab 04/02/19 1837  NA 131*  K 4.3  CL 99  CO2 20*  GLUCOSE 371*  BUN 13  CREATININE 0.96  CALCIUM 8.5*  GFRNONAA >60  GFRAA >60  ANIONGAP 12    Recent Labs  Lab 04/02/19 1837  PROT 7.0  ALBUMIN 3.0*  AST 73*  ALT 25  ALKPHOS 63  BILITOT 0.6   Hematology Recent Labs  Lab 04/02/19 1837  WBC  10.0  RBC 4.28  HGB 14.0  HCT 40.1  MCV 93.7  MCH 32.7  MCHC 34.9  RDW 12.4  PLT 189   BNPNo results for input(s): BNP, PROBNP in the last 168 hours.  DDimer No results for input(s): DDIMER in the last 168 hours.   Radiology/Studies:  CARDIAC CATHETERIZATION  Result Date: 04/02/2019 1.  Severe single-vessel coronary artery disease with critical stenosis of the mid LAD, tandem lesions treated with a long 2.5 x 38 mm resolute Onyx DES 2.  Patent left main and left circumflex without significant stenosis 3.  Moderate mid RCA stenosis, does not appear flow obstructive 4.  Mild segmental LV contraction abnormality with hypokinesis of the distal anterior wall and anteroapex, LVEF estimated at 50 to 55% Recommendation: Dual antiplatelet therapy with aspirin and ticagrelor x12 months without interruption.  Aggressive risk reduction measures.  Medical therapy for residual CAD.  If no complications arise, anticipate hospital discharge in 48 hours.      TIMI Risk Score for ST  Elevation MI:   The patient's TIMI risk score is 4, which indicates a 7.3% risk of all cause mortality at 30 days.    Assessment and Plan:   1. STEMI - ASA 81 mg daily (loaded pre-hospital) and ticagrelor 90 mg BID (loaded in lab). DAPT for at least 12 months after PCI for MI.  - Atorvastatin 40 mg daily and check lipid panel - Metoprolol 25 mg BID, uptitrate as tolerated - Add ACEi in the morning pending renal function and blood pressure - Monitor femoral access site per protocol - Echo in the morning - referral to cardiac rehab   2. Type 2 DM - Continue home antihyperglycemics and check A1c - SSI   3. Depression - home meds venlafaxine and amitriptyline   Severity of Illness: The appropriate patient status for this patient is INPATIENT. Inpatient status is judged to be reasonable and necessary in order to provide the required intensity of service to ensure the patient's safety. The patient's presenting  symptoms, physical exam findings, and initial radiographic and laboratory data in the context of their chronic comorbidities is felt to place them at high risk for further clinical deterioration. Furthermore, it is not anticipated that the patient will be medically stable for discharge from the hospital within 2 midnights of admission. The following factors support the patient status of inpatient.   " The patient's presenting symptoms include chest pain. " The worrisome physical exam findings include STEMI. " The initial radiographic and laboratory data are worrisome because of troponin elevation, ST elevations on ECG. " The chronic co-morbidities include hypertension, diabetes.  * I certify that at the point of admission it is my clinical judgment that the patient will require inpatient hospital care spanning beyond 2 midnights from the point of admission due to high intensity of service, high risk for further deterioration and high frequency of surveillance required.*    For questions or updates, please contact [facility] Please consult [email or web address] for contact info under    Signed, Osvaldo Shipper, MD  04/03/2019 2:43 AM

## 2019-04-04 LAB — POCT I-STAT, CHEM 8
BUN: 14 mg/dL (ref 8–23)
Calcium, Ion: 1.18 mmol/L (ref 1.15–1.40)
Chloride: 100 mmol/L (ref 98–111)
Creatinine, Ser: 0.7 mg/dL (ref 0.61–1.24)
Glucose, Bld: 375 mg/dL — ABNORMAL HIGH (ref 70–99)
HCT: 42 % (ref 39.0–52.0)
Hemoglobin: 14.3 g/dL (ref 13.0–17.0)
Potassium: 4.3 mmol/L (ref 3.5–5.1)
Sodium: 133 mmol/L — ABNORMAL LOW (ref 135–145)
TCO2: 23 mmol/L (ref 22–32)

## 2019-04-04 LAB — POCT ACTIVATED CLOTTING TIME
Activated Clotting Time: 224 seconds
Activated Clotting Time: 296 seconds

## 2019-04-04 LAB — GLUCOSE, CAPILLARY
Glucose-Capillary: 263 mg/dL — ABNORMAL HIGH (ref 70–99)
Glucose-Capillary: 302 mg/dL — ABNORMAL HIGH (ref 70–99)

## 2019-04-04 MED ORDER — ATORVASTATIN CALCIUM 40 MG PO TABS: 40.0000 mg | ORAL_TABLET | Freq: Every day | ORAL | 1 refills | Status: DC

## 2019-04-04 MED ORDER — TICAGRELOR 90 MG PO TABS: 90.0000 mg | ORAL_TABLET | Freq: Two times a day (BID) | ORAL | 2 refills | Status: DC

## 2019-04-04 MED ORDER — METFORMIN HCL 1000 MG PO TABS: 1000.0000 mg | ORAL_TABLET | Freq: Two times a day (BID) | ORAL | 0 refills | Status: DC

## 2019-04-04 MED ORDER — METOPROLOL TARTRATE 25 MG PO TABS: 25.0000 mg | ORAL_TABLET | Freq: Two times a day (BID) | ORAL | 0 refills | Status: DC

## 2019-04-04 MED ORDER — NITROGLYCERIN 0.4 MG SL SUBL: 0.4000 mg | SUBLINGUAL_TABLET | SUBLINGUAL | 2 refills | Status: DC | PRN

## 2019-04-04 MED ORDER — ASPIRIN 81 MG PO TBEC: 81.0000 mg | DELAYED_RELEASE_TABLET | Freq: Every day | ORAL | 1 refills | Status: DC

## 2019-04-04 MED FILL — ATORVASTATIN CALCIUM 40 MG: 40 | 90 days supply | Qty: 90 | Fill #0

## 2019-04-04 MED FILL — NITROGLYCERIN 0.4 MG TAB SL: 0.4 | 8 days supply | Qty: 25 | Fill #0

## 2019-04-04 MED FILL — metFORMIN HCL 1000 MG TABS: 1000 | 30 days supply | Qty: 60 | Fill #0

## 2019-04-04 MED FILL — ASPIRIN 81 MG TBEC: 81 | 90 days supply | Qty: 90 | Fill #0

## 2019-04-04 MED FILL — METOPROLOL TARTRATE 25 MG T: 25 | 90 days supply | Qty: 180 | Fill #0

## 2019-04-04 MED FILL — BRILINTA 90 MG TABLET: 90 | 30 days supply | Qty: 60 | Fill #0

## 2019-04-04 NOTE — TOC Benefit Eligibility Note (Signed)
Transition of Care [facility]) Benefit Eligibility Note    Patient Details  Name: Darrell [name] MRN: [id number] Date of Birth: August 16, 1950   Medication/Dose: Brilinta 90 mg bid  Covered?: Yes  Tier: 2 Drug  Prescription Coverage Preferred Pharmacy: Any retail Pharmacy  Spoke with Person/Company/Phone Number:: Retta Diones / [employer]/ [phone]  Co-Pay: 47.00 for 30 day supply 94.00 for 60 days Mail or retail  Prior Approval: No  Deductible: Unmet  Additional Notes: This is a corretion for the previous note    [employer] Phone Number: 04/04/2019, 1:55 PM

## 2019-04-04 NOTE — Care Management (Signed)
Pt deemed stable for discharge home today - pt's wife will transport him home today.  CM informed pt of ongoing copay for Eliquis - pt denied hardship with copay.  [facility] pharmacy will deliver meds to bed prior to discharge.  NO other CM needs determined - discharge order signed - CM signing off

## 2019-04-04 NOTE — Discharge Summary (Addendum)
Discharge Summary    Patient ID: Darrell [name],  MRN: [id number], DOB/AGE: 1950/04/15 69 y.o.  Admit date: 04/02/2019 Discharge date: 04/04/2019  Primary Care Provider: Jenna Luo T Primary Cardiologist: Pixie Casino, MD  Discharge Diagnoses    Principal Problem:   Acute ST elevation myocardial infarction (STEMI) involving left anterior descending (LAD) coronary artery [facility]) Active Problems:   Essential hypertension   ST elevation myocardial infarction involving left anterior descending (LAD) coronary artery ([place])   Hyperlipidemia associated with type 2 diabetes mellitus ([place])   Diabetes mellitus due to underlying condition, uncontrolled, with hypoglycemia without coma ([place])  Allergies Allergies  Allergen Reactions   Tape Rash    Diagnostic Studies/Procedures    Cath: 04/02/19  1.  Severe single-vessel coronary artery disease with critical stenosis of the mid LAD, tandem lesions treated with a long 2.5 x 38 mm resolute Onyx DES 2.  Patent left main and left circumflex without significant stenosis 3.  Moderate mid RCA stenosis, does not appear flow obstructive 4.  Mild segmental LV contraction abnormality with hypokinesis of the distal anterior wall and anteroapex, LVEF estimated at 50 to 55%   Recommendation: Dual antiplatelet therapy with aspirin and ticagrelor x12 months without interruption.  Aggressive risk reduction measures.  Medical therapy for residual CAD.  If no complications arise, anticipate hospital discharge in 48 hours.  Diagnostic Dominance: Right  Intervention    TTE: 04/03/19  IMPRESSIONS     1. Left ventricular ejection fraction, by visual estimation, is 60 to  65%. The left ventricle has normal function. There is mildly increased  left ventricular hypertrophy.   2. Apex is abnormal.   3. Left ventricular diastolic parameters are consistent with Grade I  diastolic dysfunction (impaired relaxation).   4. The left ventricle demonstrates  regional wall motion abnormalities.   5. Global right ventricle has normal systolic function.The right  ventricular size is normal. No increase in right ventricular wall  thickness.   6. Left atrial size was mild-moderately dilated.   7. Right atrial size was normal.   8. Severe mitral annular calcification.   9. The mitral valve is degenerative. Trivial mitral valve regurgitation.  10. The tricuspid valve is grossly normal.  11. The tricuspid valve is grossly normal. Tricuspid valve regurgitation  is trivial.  12. Aortic valve regurgitation is not visualized. No evidence of aortic  valve sclerosis or stenosis.  13. There is an [employer] 23 mm pericardial tissue valve in the  AV position. It is functioning normally.  14. The pulmonic valve was grossly normal. Pulmonic valve regurgitation is  trivial.  15. The interatrial septum was not well visualized.  _____________   History of Present Illness     Mr. Schickling is a 69 yo male with PMH of bioprosthetic aortic valve, HTN, HL, and DM who presented with chest pain. Reported he was watching TV earlier the evening of admission when he developed sudden onset substernal chest pressure. He took 4 baby aspirin and called EMS. ECG on arrival showed STE in anterior leads and code STEMI was activated. On arrival to the ED, he was alert and calm with 2/10 chest pain and mild hypertension. He was taken emergently to the cath lab.    Of note, rapid COVID screen had been negative, but PCR test on admission was positive. He was placed in a negative pressure room once cath completed in the CCU.  Hospital Course     Consultants: None  1.  Anterior STEMI: underwent successful PCI/DESx1 to the mLAD. Placed on DAPT with ASA/Brilinta for at least one year. hsTn peaked at 4532. Residual moderate RCA disease that was flow obstructive. Follow up echo showed normal EF with apical wall motion abnormality. Orders placed for CR and this was completed via  phone conversation given his COVID + status.   2. HL: LDL noted at 115 and placed on high dose statin.    3. DM: Does not appear that he was taking his home medications which included glipizide, metformin and glyxambi. He was resumed on glipizide 5mg  during admission and treated with SSI. Will plan to restart metformin 1000mg  BID per home meds. Hgb A1c 11 this admission.  -- instructed to arrange for close outpatient PCP follow up regarding further management of his DM.   4. COVID +: noted positive PCR on admission. Asymptomatic during admission.   5. HTN: tolerated the addition of metoprolol.   Darrell [name] was seen by Dr. Gwenlyn Found and determined stable for discharge home. Follow up as a virtual visit has been arranged as he will need to wait 21 days before he can be seen for an in person visit. Medications are listed below. Re[facility] prior to discharge.  _____________  Discharge Vitals Blood pressure 129/80, pulse 82, temperature 98.1 F (36.7 C), temperature source Oral, resp. rate 19, weight 75.7 kg, SpO2 95 %.  Filed Weights   04/03/19 0000  Weight: 75.7 kg    Labs & Radiologic Studies    CBC Recent Labs    04/02/19 1837 04/03/19 0658  WBC 10.0 10.1  HGB 14.0 13.6  HCT 40.1 39.7  MCV 93.7 94.5  PLT 189 [id number]   Basic Metabolic Panel Recent Labs    04/02/19 1837 04/03/19 0658  NA 131* 130*  K 4.3 4.2  CL 99 98  CO2 20* 20*  GLUCOSE 371* 380*  BUN 13 11  CREATININE 0.96 0.85  CALCIUM 8.5* 8.5*   Liver Function Tests Recent Labs    04/02/19 1837  AST 73*  ALT 25  ALKPHOS 63  BILITOT 0.6  PROT 7.0  ALBUMIN 3.0*   No results for input(s): LIPASE, AMYLASE in the last 72 hours. Cardiac Enzymes No results for input(s): CKTOTAL, CKMB, CKMBINDEX, TROPONINI in the last 72 hours. BNP Invalid input(s): POCBNP D-Dimer No results for input(s): DDIMER in the last 72 hours. Hemoglobin A1C Recent Labs    04/02/19 1837  HGBA1C 11.6*    Fasting Lipid Panel Recent Labs    04/03/19 0658  CHOL 219*  HDL 27*  LDLCALC 115*  TRIG 384*  CHOLHDL 8.1   Thyroid Function Tests No results for input(s): TSH, T4TOTAL, T3FREE, THYROIDAB in the last 72 hours.  Invalid input(s): FREET3 _____________  CARDIAC CATHETERIZATION  Result Date: 04/02/2019 1.  Severe single-vessel coronary artery disease with critical stenosis of the mid LAD, tandem lesions treated with a long 2.5 x 38 mm resolute Onyx DES 2.  Patent left main and left circumflex without significant stenosis 3.  Moderate mid RCA stenosis, does not appear flow obstructive 4.  Mild segmental LV contraction abnormality with hypokinesis of the distal anterior wall and anteroapex, LVEF estimated at 50 to 55% Recommendation: Dual antiplatelet therapy with aspirin and ticagrelor x12 months without interruption.  Aggressive risk reduction measures.  Medical therapy for residual CAD.  If no complications arise, anticipate hospital discharge in 48 hours.  ECHOCARDIOGRAM COMPLETE  Result Date: 04/03/2019   ECHOCARDIOGRAM REPORT   Patient Name:  Darrell [name] Date of Exam: 04/03/2019 Medical Rec #:  [id number]      Height:       69.0 in Accession #:    [id number]     Weight:       166.9 lb Date of Birth:  1950/06/09       BSA:          1.91 m Patient Age:    60 years       BP:           141/74 mmHg Patient Gender: M              HR:           79 bpm. Exam Location:  Inpatient Procedure: 2D Echo Indications:    Chest Pain 786.50 / R07.9  History:        Patient has prior history of Echocardiogram examinations, most                 recent 11/08/2015. Acute MI; Risk Factors:Hypertension,                 Dyslipidemia and Diabetes. S/p aortic valve replacement ([employer] 23 mm pericardial tissue valve )for severe AS.                 COVID-19.  Sonographer:    Clayton Lefort RDCS (AE) Referring Phys: [place]  Sonographer Comments: No subcostal window. COVID-19.  IMPRESSIONS  1. Left ventricular ejection fraction, by visual estimation, is 60 to 65%. The left ventricle has normal function. There is mildly increased left ventricular hypertrophy.  2. Apex is abnormal.  3. Left ventricular diastolic parameters are consistent with Grade I diastolic dysfunction (impaired relaxation).  4. The left ventricle demonstrates regional wall motion abnormalities.  5. Global right ventricle has normal systolic function.The right ventricular size is normal. No increase in right ventricular wall thickness.  6. Left atrial size was mild-moderately dilated.  7. Right atrial size was normal.  8. Severe mitral annular calcification.  9. The mitral valve is degenerative. Trivial mitral valve regurgitation. 10. The tricuspid valve is grossly normal. 11. The tricuspid valve is grossly normal. Tricuspid valve regurgitation is trivial. 12. Aortic valve regurgitation is not visualized. No evidence of aortic valve sclerosis or stenosis. 13. There is an [employer] 23 mm pericardial tissue valve in the AV position. It is functioning normally. 14. The pulmonic valve was grossly normal. Pulmonic valve regurgitation is trivial. 15. The interatrial septum was not well visualized. FINDINGS  Left Ventricle: Left ventricular ejection fraction, by visual estimation, is 60 to 65%. The left ventricle has normal function. The left ventricle demonstrates regional wall motion abnormalities. The left ventricular internal cavity size was the left ventricle is normal in size. There is mildly increased left ventricular hypertrophy. Concentric left ventricular hypertrophy. Left ventricular diastolic parameters are consistent with Grade I diastolic dysfunction (impaired relaxation).  LV Wall Scoring: The apex is hypokinetic. Right Ventricle: The right ventricular size is normal. No increase in right ventricular wall thickness. Global RV systolic function is has normal systolic function. Left Atrium: Left atrial  size was mild-moderately dilated. Right Atrium: Right atrial size was normal in size Pericardium: There is no evidence of pericardial effusion. Mitral Valve: The mitral valve is degenerative in appearance. There is mild thickening of the mitral valve leaflet(s). There is mild calcification of the  mitral valve leaflet(s). Severe mitral annular calcification. Trivial mitral valve regurgitation. Tricuspid Valve: The tricuspid valve is grossly normal. Tricuspid valve regurgitation is trivial. Aortic Valve: The aortic valve has been repaired/replaced. Aortic valve regurgitation is not visualized. The aortic valve is structurally normal, with no evidence of sclerosis or stenosis. Aortic valve mean gradient measures 10.0 mmHg. Aortic valve peak gradient measures 17.0 mmHg. Bioprosthetic aortic valve valve is present in the aortic position. Echo findings show normal structure and function of the aortic prosthesis. There is an [employer] 23 mm pericardial tissue valve in the AV position. It is functioning normally. Pulmonic Valve: The pulmonic valve was grossly normal. Pulmonic valve regurgitation is trivial. Pulmonic regurgitation is trivial. Aorta: The aortic root is normal in size and structure. Venous: The inferior vena cava was not well visualized. IAS/Shunts: The interatrial septum was not well visualized.  LEFT VENTRICLE PLAX 2D LVIDd:         4.10 cm LVIDs:         2.60 cm LV PW:         1.30 cm LV IVS:        1.20 cm LV SV:         50 ml LV SV Index:   25.74  RIGHT VENTRICLE RV Basal diam:  2.20 cm RV S prime:     7.62 cm/s TAPSE (M-mode): 1.1 cm LEFT ATRIUM             Index       RIGHT ATRIUM           Index LA diam:        3.50 cm 1.83 cm/m  RA Area:     11.00 cm LA Vol (A2C):   89.8 ml 46.94 ml/m RA Volume:   18.60 ml  9.72 ml/m LA Vol (A4C):   61.2 ml 31.99 ml/m LA Biplane Vol: 77.7 ml 40.62 ml/m  AORTIC VALVE AV Vmax:           206.00 cm/s AV Vmean:          146.000 cm/s AV VTI:             0.364 m AV Peak Grad:      17.0 mmHg AV Mean Grad:      10.0 mmHg LVOT Vmax:         127.00 cm/s LVOT Vmean:        89.500 cm/s LVOT VTI:          0.285 m LVOT/AV VTI ratio: 0.78  AORTA Ao Root diam: 2.40 cm TRICUSPID VALVE TR Peak grad:   14.8 mmHg TR Vmax:        211.00 cm/s  SHUNTS Systemic VTI: 0.29 m  Kate Sable MD Electronically signed by Kate Sable MD Signature Date/Time: 04/03/2019/2:30:30 PM    Final    Disposition   Pt is being discharged home today in good condition.  Follow-up Plans & Appointments    Follow-up Information     [name], MD Follow up.   Specialty: Family Medicine Why: Please arrange for follow up with you PCP regarding further management of your diabetes. It is vital that we maintain better control of your diabetes!! Contact information: [place] [address] [phone]         Lendon Colonel, NP Follow up on 04/14/2019.   Specialties: Nurse Practitioner, Radiology, Cardiology Why: at 10:30am for your follow up appt.  Contact information: [address] [place] [address] [phone]           Discharge Instructions     Amb Referral to Cardiac Rehabilitation   Complete by: As directed    Diagnosis:  Coronary Stents STEMI PTCA     After initial evaluation and assessments completed: Virtual Based Care may be provided alone or in conjunction with Phase 2 Cardiac Rehab based on patient barriers.: Yes      Discharge Medications     Medication List     STOP taking these medications    amitriptyline 25 MG tablet Commonly known as: ELAVIL   glipiZIDE 10 MG 24 hr tablet Commonly known as: GLUCOTROL XL   Glyxambi 25-5 MG Tabs Generic drug: Empagliflozin-linaGLIPtin   venlafaxine XR 150 MG 24 hr capsule Commonly known as: EFFEXOR-XR       TAKE these medications    acetaminophen 500 MG tablet Commonly known as: TYLENOL Take 1,000 mg by mouth every 6 (six) hours as  needed for headache (pain).   ascorbic acid 500 MG tablet Commonly known as: VITAMIN C Take 500 mg by mouth daily.   aspirin 81 MG EC tablet Take 1 tablet (81 mg total) by mouth daily. Start taking on: April 05, 2019   atorvastatin 40 MG tablet Commonly known as: LIPITOR Take 1 tablet (40 mg total) by mouth daily.   metFORMIN 1000 MG tablet Commonly known as: GLUCOPHAGE Take 1 tablet (1,000 mg total) by mouth 2 (two) times daily with a meal.   metoprolol tartrate 25 MG tablet Commonly known as: LOPRESSOR Take 1 tablet (25 mg total) by mouth 2 (two) times daily.   nitroGLYCERIN 0.4 MG SL tablet Commonly known as: NITROSTAT Place 1 tablet (0.4 mg total) under the tongue every 5 (five) minutes x 3 doses as needed for chest pain.   ticagrelor 90 MG Tabs tablet Commonly known as: BRILINTA Take 1 tablet (90 mg total) by mouth 2 (two) times daily.   Vitamin D3 125 MCG (5000 UT) Tabs Take 5,000 Units by mouth daily.   VITAMIN E PO Take 1 capsule by mouth daily.   Zinc 50 MG Tabs Take 50 mg by mouth daily.         Yes                               AHA/ACC Clinical Performance & Quality Measures: Aspirin prescribed? - Yes ADP Receptor Inhibitor (Plavix/Clopidogrel, Brilinta/Ticagrelor or Effient/Prasugrel) prescribed (includes medically managed patients)? - Yes Beta Blocker prescribed? - Yes High Intensity Statin (Lipitor 40-80mg  or Crestor 20-40mg ) prescribed? - Yes EF assessed during THIS hospitalization? - Yes For EF <40%, was ACEI/ARB prescribed? - Not Applicable (EF >/= [id number]) For EF <40%, Aldosterone Antagonist (Spironolactone or Eplerenone) prescribed? - Not Applicable (EF >/= [id number]) Cardiac Rehab Phase II ordered (Included Medically managed Patients)? - Yes    Outstanding Labs/Studies   FLP/LFTs in 8 weeks.   Duration of Discharge Encounter   Greater than 30 minutes including physician time.  Signed, Reino Bellis NP-C 04/04/2019, 11:54 AM   Agree with  note by Reino Bellis NP-C  Postop day 2 anterior STEMI treated with PCI drug-eluting stenting with a long drug-eluting stent in the proximal LAD.  Did have nonobstructive disease in the RCA with preserved LV function.  Is otherwise stable is been ambulating.  He is Covid positive.  He is on appropriate medications including DAPT, high-dose statin therapy and beta-blocker.  Patient is ambulating without difficulty.  Stable for discharge home today, TOC 7 then follow-up with Dr. Burt Knack in 3 to 4 weeks.  Lorretta Harp, M.D., [place], [facility], [place] [address] [place], [place]  [address]  [phone] 04/04/2019 1:24 PM

## 2019-04-04 NOTE — Progress Notes (Signed)
Delivered materials to pt and then called and discussed education with him over the phone. He is ambulating some in the room. No sx. Discussed MI, stent, Brilinta, restrictions, diet, exercise, NTG, and CRPII. Pt receptive and requests a referral be sent to [facility]. He understands the importance of Brilinta. We discussed his A1C being 11.6 and the need for tighter diet and more exercise. I also encouraged him to see his PCP in the future to review and check progress.  [place], ACSM 1:11 PM 04/04/2019

## 2019-04-04 NOTE — Progress Notes (Signed)
Progress Note  Patient Name: Darrell [name] Date of Encounter: 04/04/2019  Primary Cardiologist: Dr. [name]  Subjective   Feeling well today postop day 2 anterior STEMI treated with PCI and drug-eluting stenting of the proximal LAD.  He denies chest pain or shortness of breath.  Inpatient Medications    Scheduled Meds: . amitriptyline  25 mg Oral QHS  . aspirin EC  81 mg Oral Daily  . atorvastatin  40 mg Oral Daily  . Chlorhexidine Gluconate Cloth  6 each Topical Daily  . glipiZIDE  5 mg Oral Q breakfast  . heparin  5,000 Units Subcutaneous Q8H  . insulin aspart  0-15 Units Subcutaneous TID WC  . insulin aspart  0-5 Units Subcutaneous QHS  . insulin glargine  10 Units Subcutaneous QHS  . metoprolol tartrate  25 mg Oral BID  . sodium chloride flush  3 mL Intravenous Q12H  . ticagrelor  90 mg Oral BID  . venlafaxine XR  150 mg Oral QHS   Continuous Infusions: . sodium chloride     PRN Meds: sodium chloride, acetaminophen, nitroGLYCERIN, ondansetron (ZOFRAN) IV, sodium chloride flush   Vital Signs    Vitals:   04/03/19 1700 04/03/19 2005 04/04/19 0420 04/04/19 0800  BP:  (!) 145/77 133/88 129/80  Pulse: 78 82 81 82  Resp: 19  19   Temp:  98.2 F (36.8 C) 98.5 F (36.9 C) 98.1 F (36.7 C)  TempSrc:  Oral Oral Oral  SpO2: 93% 94% 95%   Weight:        Intake/Output Summary (Last 24 hours) at 04/04/2019 1101 Last data filed at 04/04/2019 0420 Gross per 24 hour  Intake 240 ml  Output 1000 ml  Net -760 ml   Last 3 Weights 04/03/2019 10/25/2018 04/26/2018  Weight (lbs) 166 lb 14.2 oz 187 lb 190 lb  Weight (kg) 75.7 kg 84.823 kg 86.183 kg      Telemetry    Sinus rhythm- Personally Reviewed  ECG    Not performed today- Personally Reviewed  Physical Exam   GEN: No acute distress.   Neck: No JVD Cardiac: RRR, no murmurs, rubs, or gallops.  Respiratory: Clear to auscultation bilaterally. GI: Soft, nontender, non-distended  MS: No edema; No  deformity. Neuro:  Nonfocal  Psych: Normal affect  Extremities: Groin appears stable.  There is a bandage on top of the puncture site  Labs    High Sensitivity Troponin:   Recent Labs  Lab 04/02/19 1837  TROPONINIHS 4,532*      Chemistry Recent Labs  Lab 04/02/19 1837 04/03/19 0658  NA 131* 130*  K 4.3 4.2  CL 99 98  CO2 20* 20*  GLUCOSE 371* 380*  BUN 13 11  CREATININE 0.96 0.85  CALCIUM 8.5* 8.5*  PROT 7.0  --   ALBUMIN 3.0*  --   AST 73*  --   ALT 25  --   ALKPHOS 63  --   BILITOT 0.6  --   GFRNONAA >60 >60  GFRAA >60 >60  ANIONGAP 12 12     Hematology Recent Labs  Lab 04/02/19 1837 04/03/19 0658  WBC 10.0 10.1  RBC 4.28 4.20*  HGB 14.0 13.6  HCT 40.1 39.7  MCV 93.7 94.5  MCH 32.7 32.4  MCHC 34.9 34.3  RDW 12.4 12.6  PLT 189 192    BNPNo results for input(s): BNP, PROBNP in the last 168 hours.   DDimer No results for input(s): DDIMER in the last 168 hours.  Radiology    CARDIAC CATHETERIZATION  Result Date: 04/02/2019 1.  Severe single-vessel coronary artery disease with critical stenosis of the mid LAD, tandem lesions treated with a long 2.5 x 38 mm resolute Onyx DES 2.  Patent left main and left circumflex without significant stenosis 3.  Moderate mid RCA stenosis, does not appear flow obstructive 4.  Mild segmental LV contraction abnormality with hypokinesis of the distal anterior wall and anteroapex, LVEF estimated at 50 to 55% Recommendation: Dual antiplatelet therapy with aspirin and ticagrelor x12 months without interruption.  Aggressive risk reduction measures.  Medical therapy for residual CAD.  If no complications arise, anticipate hospital discharge in 48 hours.  ECHOCARDIOGRAM COMPLETE  Result Date: 04/03/2019   ECHOCARDIOGRAM REPORT   Patient Name:   Darrell [name] Date of Exam: 04/03/2019 Medical Rec #:  [id number]      Height:       69.0 in Accession #:    [id number]     Weight:       166.9 lb Date of Birth:  03/06/1950       BSA:           1.91 m Patient Age:    69 years       BP:           141/74 mmHg Patient Gender: M              HR:           79 bpm. Exam Location:  Inpatient Procedure: 2D Echo Indications:    Chest Pain 786.50 / R07.9  History:        Patient has prior history of Echocardiogram examinations, most                 recent 11/08/2015. Acute MI; Risk Factors:Hypertension,                 Dyslipidemia and Diabetes. S/p aortic valve replacement ([employer] 23 mm pericardial tissue valve )for severe AS.                 COVID-19.  Sonographer:    Clayton Lefort RDCS (AE) Referring Phys: [place]  Sonographer Comments: No subcostal window. COVID-19. IMPRESSIONS  1. Left ventricular ejection fraction, by visual estimation, is 60 to 65%. The left ventricle has normal function. There is mildly increased left ventricular hypertrophy.  2. Apex is abnormal.  3. Left ventricular diastolic parameters are consistent with Grade I diastolic dysfunction (impaired relaxation).  4. The left ventricle demonstrates regional wall motion abnormalities.  5. Global right ventricle has normal systolic function.The right ventricular size is normal. No increase in right ventricular wall thickness.  6. Left atrial size was mild-moderately dilated.  7. Right atrial size was normal.  8. Severe mitral annular calcification.  9. The mitral valve is degenerative. Trivial mitral valve regurgitation. 10. The tricuspid valve is grossly normal. 11. The tricuspid valve is grossly normal. Tricuspid valve regurgitation is trivial. 12. Aortic valve regurgitation is not visualized. No evidence of aortic valve sclerosis or stenosis. 13. There is an [employer] 23 mm pericardial tissue valve in the AV position. It is functioning normally. 14. The pulmonic valve was grossly normal. Pulmonic valve regurgitation is trivial. 15. The interatrial septum was not well visualized. FINDINGS  Left Ventricle: Left ventricular ejection fraction, by  visual estimation, is 60 to 65%. The  left ventricle has normal function. The left ventricle demonstrates regional wall motion abnormalities. The left ventricular internal cavity size was the left ventricle is normal in size. There is mildly increased left ventricular hypertrophy. Concentric left ventricular hypertrophy. Left ventricular diastolic parameters are consistent with Grade I diastolic dysfunction (impaired relaxation).  LV Wall Scoring: The apex is hypokinetic. Right Ventricle: The right ventricular size is normal. No increase in right ventricular wall thickness. Global RV systolic function is has normal systolic function. Left Atrium: Left atrial size was mild-moderately dilated. Right Atrium: Right atrial size was normal in size Pericardium: There is no evidence of pericardial effusion. Mitral Valve: The mitral valve is degenerative in appearance. There is mild thickening of the mitral valve leaflet(s). There is mild calcification of the mitral valve leaflet(s). Severe mitral annular calcification. Trivial mitral valve regurgitation. Tricuspid Valve: The tricuspid valve is grossly normal. Tricuspid valve regurgitation is trivial. Aortic Valve: The aortic valve has been repaired/replaced. Aortic valve regurgitation is not visualized. The aortic valve is structurally normal, with no evidence of sclerosis or stenosis. Aortic valve mean gradient measures 10.0 mmHg. Aortic valve peak gradient measures 17.0 mmHg. Bioprosthetic aortic valve valve is present in the aortic position. Echo findings show normal structure and function of the aortic prosthesis. There is an [employer] 23 mm pericardial tissue valve in the AV position. It is functioning normally. Pulmonic Valve: The pulmonic valve was grossly normal. Pulmonic valve regurgitation is trivial. Pulmonic regurgitation is trivial. Aorta: The aortic root is normal in size and structure. Venous: The inferior vena cava was not well visualized.  IAS/Shunts: The interatrial septum was not well visualized.  LEFT VENTRICLE PLAX 2D LVIDd:         4.10 cm LVIDs:         2.60 cm LV PW:         1.30 cm LV IVS:        1.20 cm LV SV:         50 ml LV SV Index:   25.74  RIGHT VENTRICLE RV Basal diam:  2.20 cm RV S prime:     7.62 cm/s TAPSE (M-mode): 1.1 cm LEFT ATRIUM             Index       RIGHT ATRIUM           Index LA diam:        3.50 cm 1.83 cm/m  RA Area:     11.00 cm LA Vol (A2C):   89.8 ml 46.94 ml/m RA Volume:   18.60 ml  9.72 ml/m LA Vol (A4C):   61.2 ml 31.99 ml/m LA Biplane Vol: 77.7 ml 40.62 ml/m  AORTIC VALVE AV Vmax:           206.00 cm/s AV Vmean:          146.000 cm/s AV VTI:            0.364 m AV Peak Grad:      17.0 mmHg AV Mean Grad:      10.0 mmHg LVOT Vmax:         127.00 cm/s LVOT Vmean:        89.500 cm/s LVOT VTI:          0.285 m LVOT/AV VTI ratio: 0.78  AORTA Ao Root diam: 2.40 cm TRICUSPID VALVE TR Peak grad:   14.8 mmHg TR Vmax:        211.00 cm/s  SHUNTS Systemic VTI: 0.29 m  Kate Sable MD Electronically signed by Kate Sable MD Signature Date/Time: 04/03/2019/2:30:30 PM    Final     Cardiac Studies   2D echocardiogram (04/03/2019)   1. Left ventricular ejection fraction, by visual estimation, is 60 to  65%. The left ventricle has normal function. There is mildly increased  left ventricular hypertrophy.  2. Apex is abnormal.  3. Left ventricular diastolic parameters are consistent with Grade I  diastolic dysfunction (impaired relaxation).  4. The left ventricle demonstrates regional wall motion abnormalities.  5. Global right ventricle has normal systolic function.The right  ventricular size is normal. No increase in right ventricular wall  thickness.  6. Left atrial size was mild-moderately dilated.  7. Right atrial size was normal.  8. Severe mitral annular calcification.  9. The mitral valve is degenerative. Trivial mitral valve regurgitation.  10. The tricuspid valve is grossly normal.   11. The tricuspid valve is grossly normal. Tricuspid valve regurgitation  is trivial.  12. Aortic valve regurgitation is not visualized. No evidence of aortic  valve sclerosis or stenosis.  13. There is an [employer] 23 mm pericardial tissue valve in the  AV position. It is functioning normally.  14. The pulmonic valve was grossly normal. Pulmonic valve regurgitation is  trivial.  15. The interatrial septum was not well visualized.   FINDINGS  Left Ventricle: Left ventricular ejection fraction, by visual estimation,  is 60 to 65%. The left ventricle has normal function. The left ventricle  demonstrates regional wall motion abnormalities. The left ventricular  internal cavity size was the left  ventricle is normal in size. There is mildly increased left ventricular  hypertrophy. Concentric left ventricular hypertrophy. Left ventricular  diastolic parameters are consistent with Grade I diastolic dysfunction  (impaired relaxation).   Cardiac catheterization/PCI drug-eluting stent (04/02/2019)  Conclusion  1.  Severe single-vessel coronary artery disease with critical stenosis of the mid LAD, tandem lesions treated with a long 2.5 x 38 mm resolute Onyx DES 2.  Patent left main and left circumflex without significant stenosis 3.  Moderate mid RCA stenosis, does not appear flow obstructive 4.  Mild segmental LV contraction abnormality with hypokinesis of the distal anterior wall and anteroapex, LVEF estimated at 50 to 55%  Recommendation: Dual antiplatelet therapy with aspirin and ticagrelor x12 months without interruption.  Aggressive risk reduction measures.  Medical therapy for residual CAD.  If no complications arise, anticipate hospital discharge in 48 hours.    Intervention   Implants     Patient Profile     69 y.o. male history of hyperlipidemia status post porcine AVR in 2013 with minimal CAD at that time.  He was PCR positive for COVID-19.  He presented with  anterior ST segment elevation and underwent PCI drug-eluting stenting of the proximal LAD via the femoral approach with a 38 mm long resolute drug-eluting stent.  He currently is pain-free with normal LV function by 2D echo.  Assessment & Plan    1: CAD-postop day 2 anterior STEMI with evolving EKG changes.  His EF is preserved with an apical wall motion abnormality.  He had a 2.5 x 38 mm long resolute drug-eluting stent implanted to cover 2 tandem lesions in the proximal LAD with excellent result.  He had moderate but noncritical disease in the mid RCA.  He is on aspirin Brilinta which he will need to stay on uninterrupted for 12 months.  2: Hyperlipidemia-LDL of 115 on high-dose statin therapy  3: COVID-19-PCR positive  Patient  stable for discharge.  No further chest pain on DAPT (aspirin/Brilinta) uninterrupted for 12 months.  He will need TOC 7 followed by return office visit with Dr. Burt Knack in 3 to 4 weeks.  For questions or updates, please contact [place] Please consult [email or web address] for contact info under        Signed, Quay Burow, MD  04/04/2019, 11:01 AM

## 2019-04-04 NOTE — TOC Benefit Eligibility Note (Signed)
Transition of Care [facility]) Benefit Eligibility Note    Patient Details  Name: Darrell [name] MRN: [id number] Date of Birth: 12/26/1950   Medication/Dose: Kary Kos 90mg  BID  Covered?: Yes  Tier: 2 Drug  Prescription Coverage Preferred Pharmacy: Any Retail Pharmacy  Spoke with Person/Company/Phone Number:: [employer]/ [employer] /  Co-Pay: 47.00 for a 30 day supply retail 97.00 for a 60 day supply Mail Order  Prior Approval: Yes  Deductible: Unmet       Orbie Pyo Phone Number: 04/04/2019, 11:01 AM

## 2019-04-04 NOTE — Discharge Instructions (Signed)
COVID instructions:  Follow these instructions at home: Lifestyle  If you are sick, stay home except to get medical care. Your health care provider will tell you how long to stay home. Call your health care provider before you go for medical care.  Rest at home as told by your health care provider.  Do not use any products that contain nicotine or tobacco, such as cigarettes, e-cigarettes, and chewing tobacco. If you need help quitting, ask your health care provider.  Return to your normal activities as told by your health care provider. Ask your health care provider what activities are safe for you. General instructions  Take over-the-counter and prescription medicines only as told by your health care provider.  Drink enough fluid to keep your urine pale yellow.  Keep all follow-up visits as told by your health care provider. This is important. How is this prevented?   To protect yourself:   Do not travel to areas where COVID-19 is a risk. The areas where COVID-19 is reported change often. To identify high-risk areas and travel restrictions, check the CDC travel website: FatFares.com.br  If you live in, or must travel to, an area where COVID-19 is a risk, take precautions to avoid infection. ? Stay away from people who are sick. ? Wash your hands often with soap and water for 20 seconds. If soap and water are not available, use an alcohol-based hand sanitizer. ? Avoid touching your mouth, face, eyes, or nose. ? Avoid going out in public, follow guidance from your state and local health authorities. ? If you must go out in public, wear a cloth face covering or face mask. Make sure your mask covers your nose and mouth. ? Avoid crowded indoor spaces. Stay at least 6 feet (2 meters) away from others. ? Disinfect objects and surfaces that are frequently touched every day. This may include:  Counters and tables.  Doorknobs and light switches.  Sinks and  faucets.  Electronics, such as phones, remote controls, keyboards, computers, and tablets. To protect others: If you have symptoms of COVID-19, take steps to prevent the virus from spreading to others.  If you think you have a COVID-19 infection. Tell your health care team that you think you may have a COVID-19 infection.  Stay home. Leave your house only to seek medical care. Do not use public transport.  Do not travel while you are sick.  Wash your hands often with soap and water for 20 seconds. If soap and water are not available, use alcohol-based hand sanitizer.  Stay away from other members of your household. Let healthy household members care for children and pets, if possible. If you have to care for children or pets, wash your hands often and wear a mask. If possible, stay in your own room, separate from others. Use a different bathroom.  Make sure that all people in your household wash their hands well and often.  Cough or sneeze into a tissue or your sleeve or elbow. Do not cough or sneeze into your hand or into the air.  Wear a cloth face covering or face mask. Make sure your mask covers your nose and mouth.  You will need to quarantine for at least 14 days from the time of your hospital discharge.  Where to find more information  Centers for Disease Control and Prevention: PurpleGadgets.be  World Health Organization: https://www.castaneda.info/ Get help right away if:  You have trouble breathing.  You have pain or pressure in your chest.  You have confusion.  You have bluish lips and fingernails.  You have difficulty waking from sleep.  You have symptoms that get worse. These symptoms may represent a serious problem that is an emergency. Do not wait to see if the symptoms will go away. Get medical help right away. Call your local emergency services (911 in the [place] Do not drive yourself to the hospital. Let the emergency  medical personnel know if you think you have COVID-19.

## 2019-04-04 NOTE — Progress Notes (Signed)
[name] to be D/C'd Home per MD order.  Discussed with the patient and all questions fully answered.  VSS, Skin clean, dry and intact without evidence of skin break down, no evidence of skin tears noted. IV catheter discontinued intact. Site without signs and symptoms of complications. Dressing and pressure applied.  An After Visit Summary was printed and given to the patient. Patient received prescription medicine.  D/c education completed with patient/family including follow up instructions, medication list, d/c activities limitations if indicated, with other d/c instructions as indicated by MD - patient able to verbalize understanding, all questions fully answered.   Patient instructed to return to ED, call 911, or call MD for any changes in condition.   Patient escorted via [place], and D/C home via private auto.  Dene Gentry 04/04/2019 6:06 PM

## 2019-04-04 NOTE — Progress Notes (Signed)
Inpatient Diabetes Program Recommendations  AACE/ADA: New Consensus Statement on Inpatient Glycemic Control (2015)  Target Ranges:  Prepandial:   less than 140 mg/dL      Peak postprandial:   less than 180 mg/dL (1-2 hours)      Critically ill patients:  140 - 180 mg/dL   Lab Results  Component Value Date   GLUCAP 302 (H) 04/04/2019   HGBA1C 11.6 (H) 04/02/2019    Spoke with pt over the phone regarding A1c level and glucose control at home. Discussed current A1c level of 11.6%. Discussed each pil can only lower that number 1-2 points and he may need insulin. Told pt to check glucose at least 2 times a day ( fasting and alternating second check). If glucose trends are above 200 all the time to call his PCP for recommendations on how to dose his medication. Prepared patient for the possible need for insulin. Lunchtime glucose 302. Pt waiting on wife to come pick him up for d/c.  Thanks,  Tama Headings RN, MSN, BC-ADM Inpatient Diabetes Coordinator Team Pager [phone] (8a-5p)

## 2019-04-10 ENCOUNTER — Emergency Department (HOSPITAL_COMMUNITY): Payer: BC Managed Care – PPO

## 2019-04-10 ENCOUNTER — Emergency Department (HOSPITAL_COMMUNITY)
Admission: EM | Admit: 2019-04-10 | Discharge: 2019-04-10 | Disposition: A | Discharge status: Home / Self Care | Payer: BC Managed Care – PPO | Attending: Emergency Medicine | Admitting: Emergency Medicine

## 2019-04-10 ENCOUNTER — Other Ambulatory Visit: Payer: Self-pay

## 2019-04-10 ENCOUNTER — Encounter (HOSPITAL_COMMUNITY): Payer: Self-pay | Admitting: Emergency Medicine

## 2019-04-10 DIAGNOSIS — Z952 Presence of prosthetic heart valve: Secondary | ICD-10-CM | POA: Diagnosis not present

## 2019-04-10 DIAGNOSIS — W109XXA Fall (on) (from) unspecified stairs and steps, initial encounter: Secondary | ICD-10-CM | POA: Diagnosis not present

## 2019-04-10 DIAGNOSIS — I10 Essential (primary) hypertension: Secondary | ICD-10-CM | POA: Insufficient documentation

## 2019-04-10 DIAGNOSIS — Y9301 Activity, walking, marching and hiking: Secondary | ICD-10-CM | POA: Insufficient documentation

## 2019-04-10 DIAGNOSIS — M25552 Pain in left hip: Secondary | ICD-10-CM

## 2019-04-10 DIAGNOSIS — Z8616 Personal history of COVID-19: Secondary | ICD-10-CM | POA: Diagnosis not present

## 2019-04-10 DIAGNOSIS — Y999 Unspecified external cause status: Secondary | ICD-10-CM | POA: Insufficient documentation

## 2019-04-10 DIAGNOSIS — E1165 Type 2 diabetes mellitus with hyperglycemia: Secondary | ICD-10-CM | POA: Diagnosis not present

## 2019-04-10 DIAGNOSIS — Y92008 Other place in unspecified non-institutional (private) residence as the place of occurrence of the external cause: Secondary | ICD-10-CM | POA: Insufficient documentation

## 2019-04-10 DIAGNOSIS — W19XXXA Unspecified fall, initial encounter: Secondary | ICD-10-CM

## 2019-04-10 DIAGNOSIS — R739 Hyperglycemia, unspecified: Secondary | ICD-10-CM

## 2019-04-10 LAB — CBC WITH DIFFERENTIAL/PLATELET
Abs Immature Granulocytes: 0.03 10*3/uL (ref 0.00–0.07)
Basophils Absolute: 0 10*3/uL (ref 0.0–0.1)
Basophils Relative: 1 %
Eosinophils Absolute: 0.3 10*3/uL (ref 0.0–0.5)
Eosinophils Relative: 3 %
HCT: 41.1 % (ref 39.0–52.0)
Hemoglobin: 13.9 g/dL (ref 13.0–17.0)
Immature Granulocytes: 0 %
Lymphocytes Relative: 23 %
Lymphs Abs: 1.9 10*3/uL (ref 0.7–4.0)
MCH: 32.5 pg (ref 26.0–34.0)
MCHC: 33.8 g/dL (ref 30.0–36.0)
MCV: 96 fL (ref 80.0–100.0)
Monocytes Absolute: 0.6 10*3/uL (ref 0.1–1.0)
Monocytes Relative: 7 %
Neutro Abs: 5.7 10*3/uL (ref 1.7–7.7)
Neutrophils Relative %: 66 %
Platelets: 224 10*3/uL (ref 150–400)
RBC: 4.28 MIL/uL (ref 4.22–5.81)
RDW: 13 % (ref 11.5–15.5)
WBC: 8.6 10*3/uL (ref 4.0–10.5)
nRBC: 0 % (ref 0.0–0.2)

## 2019-04-10 LAB — BASIC METABOLIC PANEL
Anion gap: 13 (ref 5–15)
BUN: 20 mg/dL (ref 8–23)
CO2: 22 mmol/L (ref 22–32)
Calcium: 9.2 mg/dL (ref 8.9–10.3)
Chloride: 98 mmol/L (ref 98–111)
Creatinine, Ser: 1.05 mg/dL (ref 0.61–1.24)
GFR calc Af Amer: >60 mL/min (ref >60)
GFR calc non Af Amer: >60 mL/min (ref >60)
Glucose, Bld: 310 mg/dL — ABNORMAL HIGH (ref 70–99)
Potassium: 3.9 mmol/L (ref 3.5–5.1)
Sodium: 133 mmol/L — ABNORMAL LOW (ref 135–145)

## 2019-04-10 LAB — CBG MONITORING, ED: Glucose-Capillary: 321 mg/dL — ABNORMAL HIGH (ref 70–99)

## 2019-04-10 IMAGING — DX DG HIP (WITH OR WITHOUT PELVIS) 2-3V*L*
4 series · 4 of 4 positions shown · non-contrast
Comparison: None.

CLINICAL DATA: Post fall with left hip pain.

EXAM:
DG HIP (WITH OR WITHOUT PELVIS) 2-3V LEFT

[pelvis ap (1 of 2)]
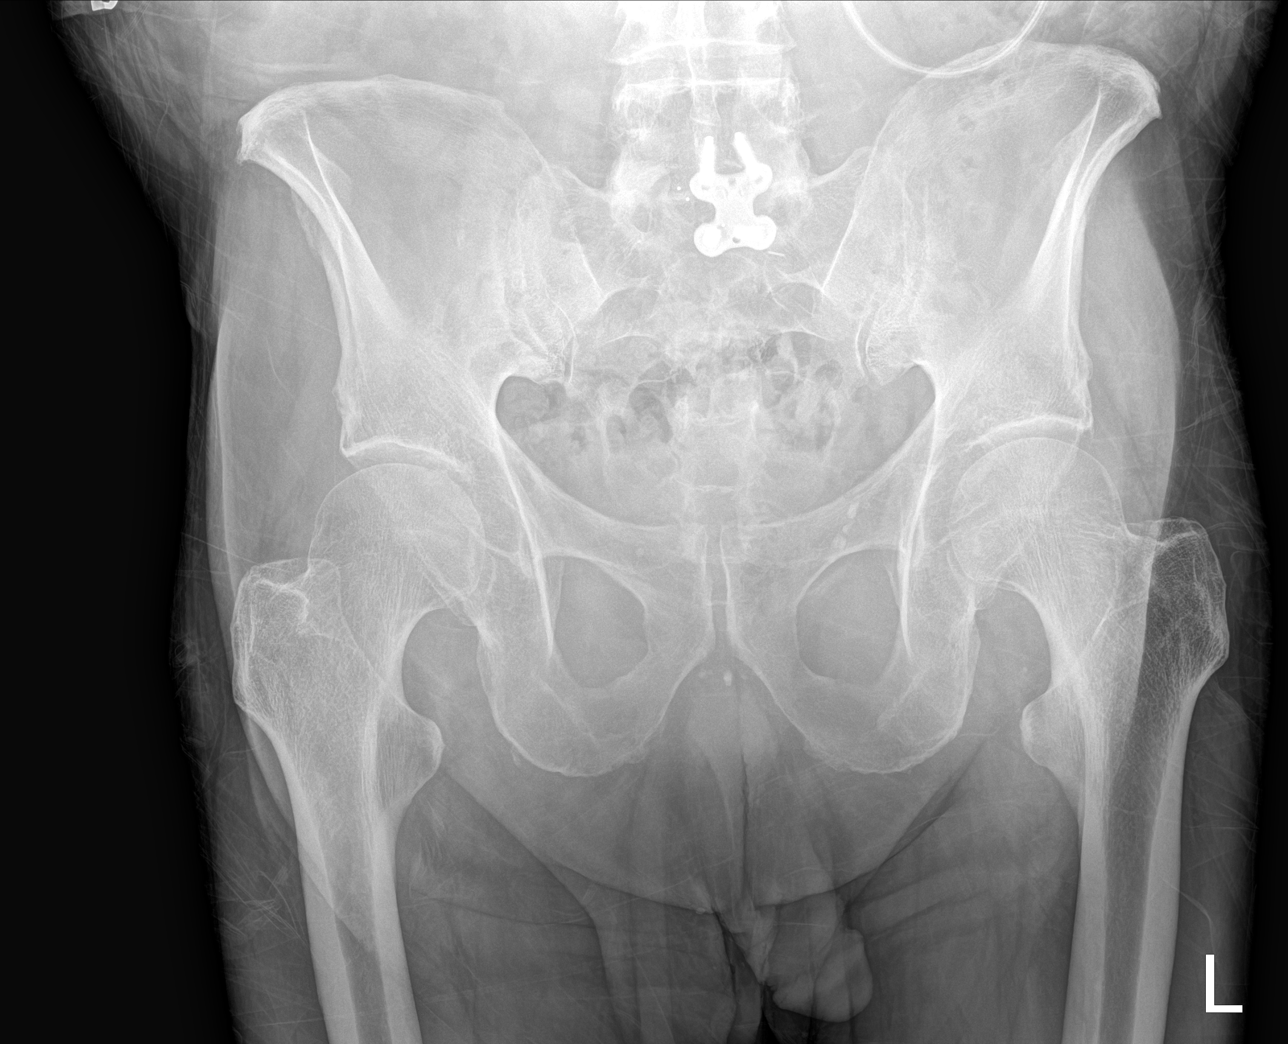

[hip ap]
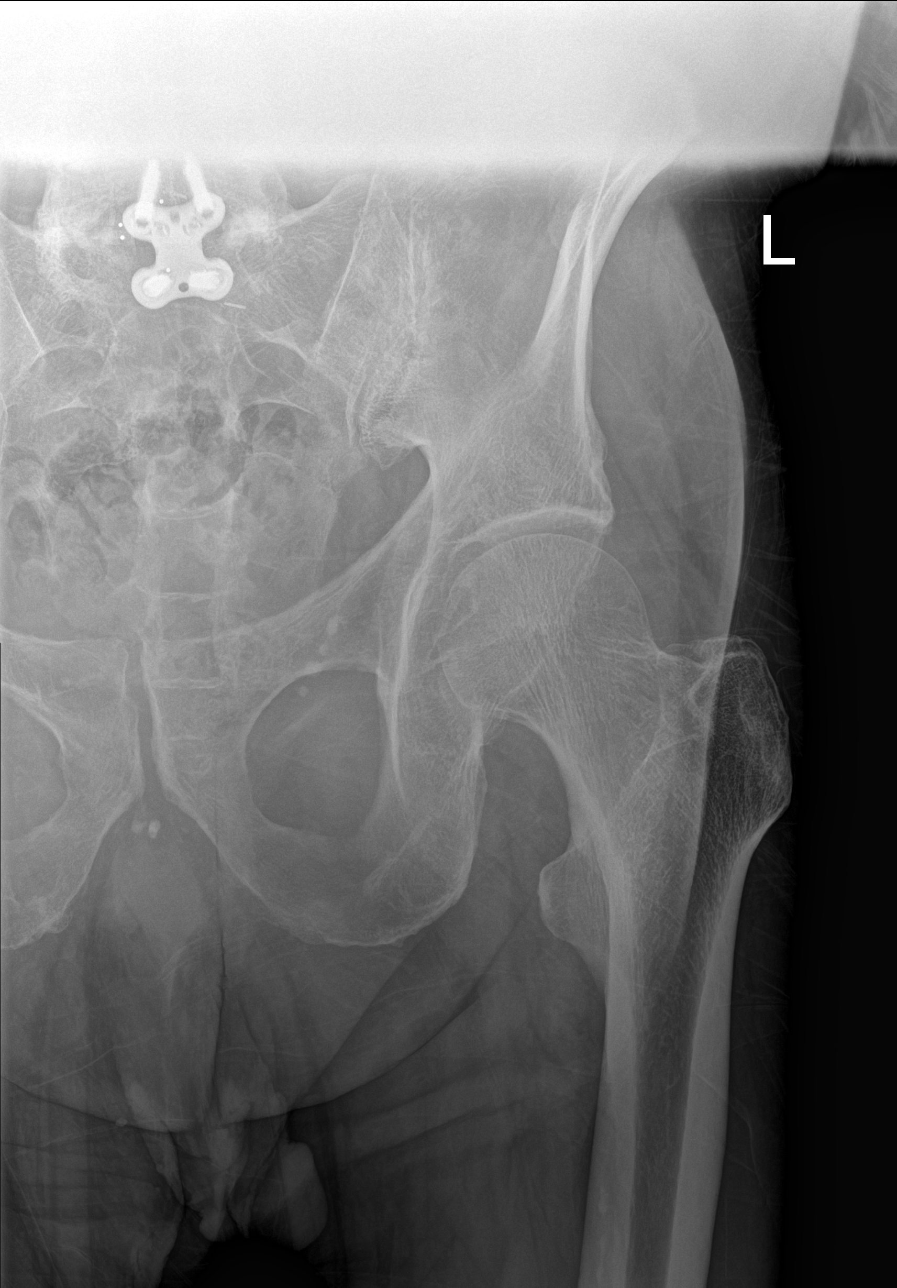

[hip lat]
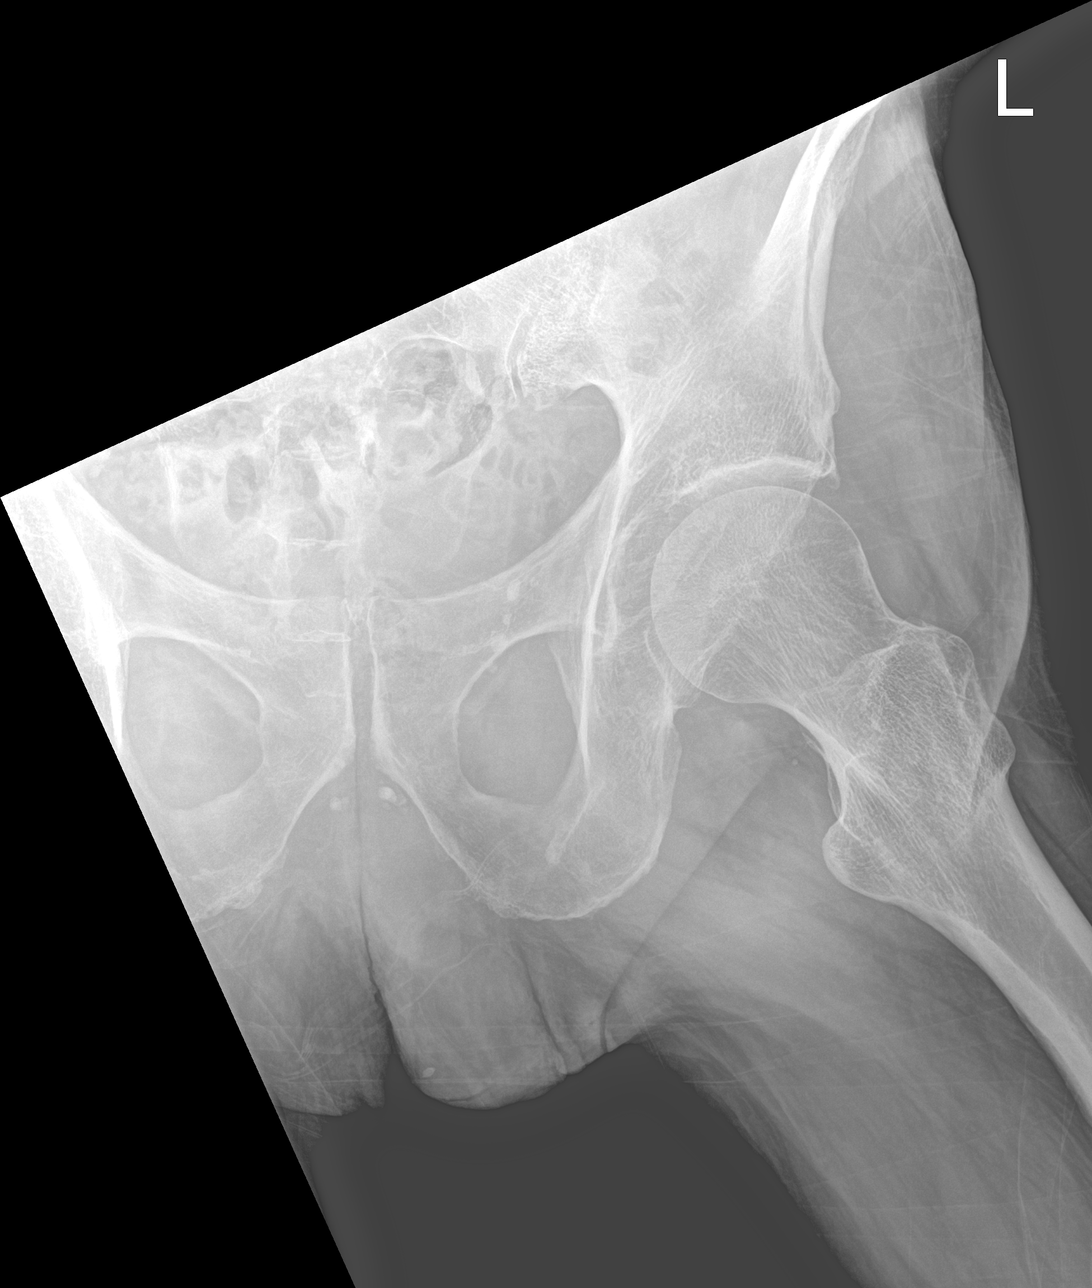

[pelvis ap (2 of 2)]
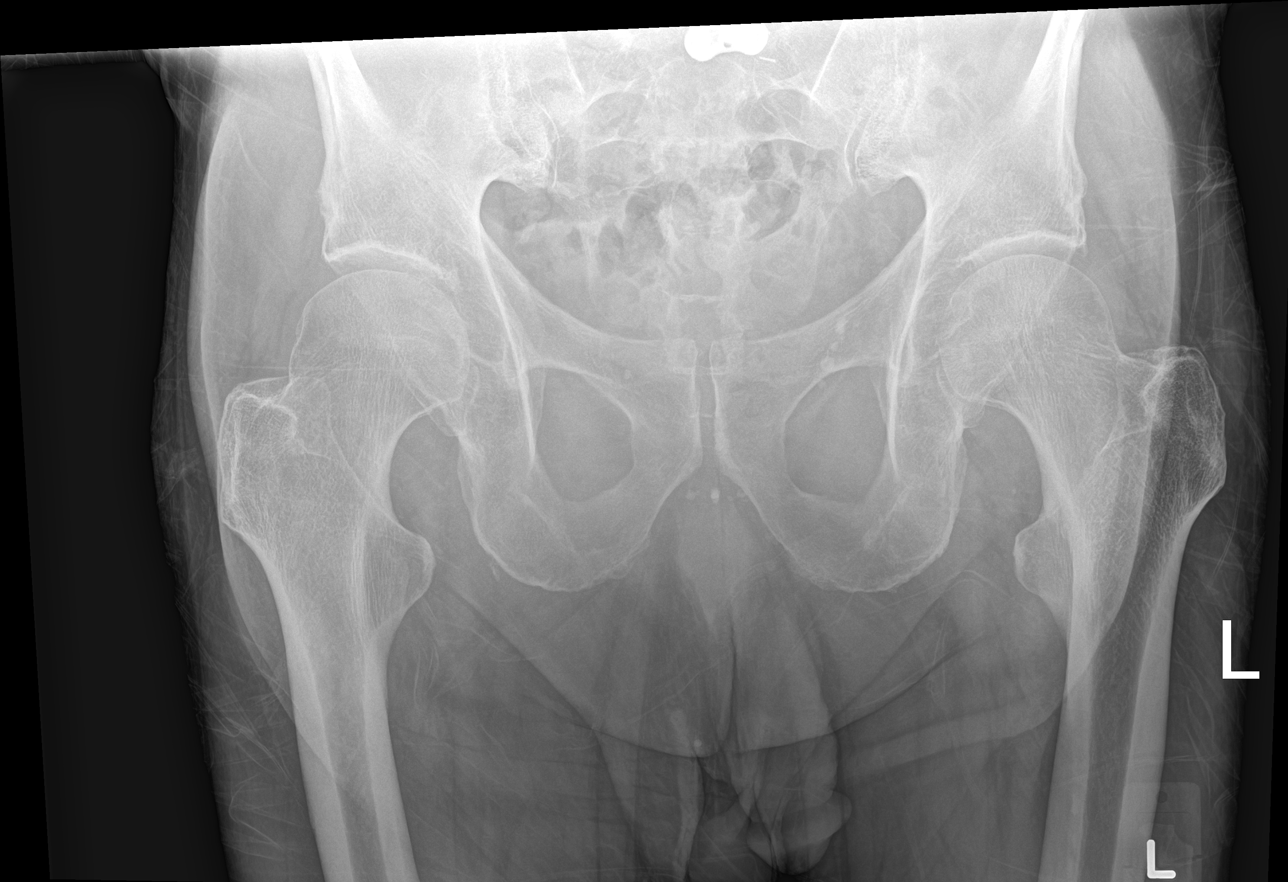

[4 of 4 positions shown; findings below may reference images not displayed]

FINDINGS: There is no evidence of hip fracture or dislocation. There is no
evidence of arthropathy or other focal bone abnormality.
IMPRESSION: Negative.

## 2019-04-10 MED ORDER — SODIUM CHLORIDE 0.9 % IV BOLUS
500.0000 mL | Freq: Once | INTRAVENOUS | Status: AC
  Administered 2019-04-10: 11:00:00 500 mL via INTRAVENOUS

## 2019-04-10 MED ORDER — DICLOFENAC SODIUM 1 % EX GEL: 2.0000 g | Freq: Four times a day (QID) | CUTANEOUS | 0 refills | Status: DC | PRN
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
## 2019-04-10 NOTE — ED Notes (Signed)
Spoke with pt's wife after obtaining a usable number. She is on her way to pick him up

## 2019-04-10 NOTE — ED Triage Notes (Signed)
Pt slipped on ice on fell on the top step. C/o left hip pain but was ambulatory at scene.

## 2019-04-10 NOTE — ED Notes (Signed)
Ambulated in room with minimal to no assistance.

## 2019-04-10 NOTE — Discharge Instructions (Signed)
You were seen in the emergency department today with left hip pain after a fall.  Your x-ray did not show fracture.  Please use the topical pain medication called into your pharmacy along with Tylenol as needed for pain.  Please try and stay as mobile as possible and follow-up with your primary care doctor by phone on Monday to schedule a follow-up appointment.  If you have additional falls or develop chest pain, lightheadedness, abdominal pain, vomiting, or other severe symptoms you should return to the emergency department for reevaluation.  Your blood sugar continues to run high but your lab work today did not show a diabetes emergency.  Please continue your home diabetes medications and keep your primary care doctor appointment to discuss further diabetes management.

## 2019-04-13 NOTE — Progress Notes (Signed)
Virtual Visit via Telephone Note   This visit type was conducted due to national recommendations for restrictions regarding the COVID-19 Pandemic (e.g. social distancing) in an effort to limit this patient's exposure and mitigate transmission in our community.  Due to his co-morbid illnesses, this patient is at least at moderate risk for complications without adequate follow up.  This format is felt to be most appropriate for this patient at this time.  The patient did not have access to video technology/had technical difficulties with video requiring transitioning to audio format only (telephone).  All issues noted in this document were discussed and addressed.  No physical exam could be performed with this format.  Please refer to the patient's chart for his  consent to telehealth for [facility].   Date:  04/14/2019   ID:  Darrell [name], DOB 1950-08-04, MRN [id number]  Patient Location: Home Provider Location: Home  PCP:  [name], MD  Cardiologist:  Pixie Casino, MD  Electrophysiologist:  None   Evaluation Performed:  Follow-Up Visit  Chief Complaint:  Follow up appointment.   History of Present Illness:    Darrell [name] is a 69 y.o. male we are following post hospitalization after admission with acute STEMI of the LAD, with hx of hypertension, HL, Diabetes, bioprosthetic aortic valve.    He presented with chest pain at rest while watching TV, took ASA and called EMS. He underwent successful PCI/DES to the mLAD. Placed on DAPT with ASA and Brilinta, to continue for at least one year EF was found to be normal. He was referred to cardiac rehab. He was placed on high dose statin along with metoprolol.  He was discharged on 04/04/2019.  It was noted that he did not appear to be taking diabetic medications which included glipizide and metformin.He was treated with SSI in the hospital and sent home on metformin 1000 mg BID. He was to follow up with PCP for ongoing  management.  Of note, he tested positive for COVID-19 on admission. Was asymptomatic. He was to keep quarantined post discharge for 10 days and wait 21 days before being seen in person.    He has some complaints of bruising at the cath insertion site and some mild breathing trouble after taking Brilinta.  He is not very active. Still has some fatigue from COVID.  He is medically complaint. He does not check his BP or his BG at home although he has both monitors. He is due to see his PCP, Monday 04/18/2019.  The patient does not  have symptoms concerning for COVID-19 infection (fever, chills, cough, or new shortness of breath). Recovering.    Past Medical History:  Diagnosis Date  . Aortic stenosis 05/2011   Severe by recent echo; AoV area ~0.77-79 cm2, peak /mean gradient 86 mmHg/ 55 mmHg.  . Arthritis    back- low- [facility]- workmen's comp. situation   . DDD (degenerative disc disease), lumbosacral   . Depression    quick temper- per pt.   . Diverticulosis    slight -per colonoscopy, pt. asymptomatic- thus far  . GERD (gastroesophageal reflux disease)   . Hiatal hernia   . History of BPH   . History of MRSA infection   . Hyperlipidemia   . Hypertension    [facility]- Dr. Ellyn Hack   . Insomnia   . Myocardial infarction ([place])    By report only, cardiac catheterization was negative for ischemia.  Marland Kitchen NIDDM (non-insulin dependent diabetes mellitus)  Type 2 NIDDM x 5 years  . Prostate disease   . Recurrent upper respiratory infection (URI)    cold- curently- "per pt., on the mend"  . S/P AVR (aortic valve replacement) 08/05/2011   Preop cath showed normal coronaries; AVR--Dr. Servando Snare, MD; 23 mm Magna Ease bioprosthetic Aortic Valve  . STEMI (ST elevation myocardial infarction) ([place])    PCI/DES x1 to mLAD   Past Surgical History:  Procedure Laterality Date  . ABDOMINAL EXPOSURE N/A 04/01/2012   Procedure: ABDOMINAL EXPOSURE;  Surgeon: Angelia Mould, MD;  Location: [place];   Service: Vascular;  Laterality: N/A;  . ANTERIOR LUMBAR FUSION N/A 04/01/2012   Procedure: ANTERIOR LUMBAR FUSION 1 LEVEL;  Surgeon: [name], MD;  Location: [place];  Service: Orthopedics;  Laterality: N/A;  ALIF L5-S1  . AORTIC VALVE REPLACEMENT  08/05/2011   Procedure: AORTIC VALVE REPLACEMENT (AVR);  Surgeon: [name], MD;  Location: [place];  Service: Open Heart Surgery;  Laterality: N/A;  . CARDIAC CATHETERIZATION  06/2011    Minimal coronary disease  . CORONARY/GRAFT ACUTE MI REVASCULARIZATION N/A 04/02/2019   Procedure: Coronary/Graft Acute MI Revascularization;  Surgeon: [name], MD;  Location: [place] CV [facility];  Service: Cardiovascular;  Laterality: N/A;  . LEFT AND RIGHT HEART CATHETERIZATION WITH CORONARY ANGIOGRAM  07/03/2011   Procedure: LEFT AND RIGHT HEART CATHETERIZATION WITH CORONARY ANGIOGRAM;  Surgeon: [name], MD;  Location: [place] CATH LAB;  Service: Cardiovascular;;  . LEFT HEART CATH AND CORONARY ANGIOGRAPHY N/A 04/02/2019   Procedure: LEFT HEART CATH AND CORONARY ANGIOGRAPHY;  Surgeon: [name], MD;  Location: [place] [facility];  Service: Cardiovascular;  Laterality: N/A;  . LUMBAR FUSION  04/02/2012   Dr [name]  . NASAL SEPTUM SURGERY     [facility]- 10 yrs. ago  . NM MYOVIEW LTD  April 2013   Low risk.     Current Meds  Medication Sig  . acetaminophen (TYLENOL) 500 MG tablet Take 1,000 mg by mouth every 6 (six) hours as needed for headache (pain).  Marland Kitchen ascorbic acid (VITAMIN C) 500 MG tablet Take 500 mg by mouth daily.  Marland Kitchen aspirin EC 81 MG EC tablet Take 1 tablet (81 mg total) by mouth daily.  Marland Kitchen atorvastatin (LIPITOR) 40 MG tablet Take 1 tablet (40 mg total) by mouth daily.  . Cholecalciferol (VITAMIN D3) 125 MCG (5000 UT) TABS Take 5,000 Units by mouth daily.  . diclofenac Sodium (VOLTAREN) 1 % GEL Apply 2 g topically 4 (four) times daily as needed.  . metFORMIN (GLUCOPHAGE) 1000 MG tablet Take 1 tablet (1,000 mg total) by mouth 2  (two) times daily with a meal.  . metoprolol tartrate (LOPRESSOR) 25 MG tablet Take 1 tablet (25 mg total) by mouth 2 (two) times daily.  . nitroGLYCERIN (NITROSTAT) 0.4 MG SL tablet Place 1 tablet (0.4 mg total) under the tongue every 5 (five) minutes x 3 doses as needed for chest pain.  . ticagrelor (BRILINTA) 90 MG TABS tablet Take 1 tablet (90 mg total) by mouth 2 (two) times daily.  Marland Kitchen VITAMIN E PO Take 1 capsule by mouth daily.  . Zinc 50 MG TABS Take 50 mg by mouth daily.     Allergies:   Tape   Social History   Tobacco Use  . Smoking status: Never Smoker  . Smokeless tobacco: Never Used  Substance Use Topics  . Alcohol use: No  . Drug use: No     Family Hx:  The patient's family history includes Heart attack in his brother; Heart disease in his mother. There is no history of Anesthesia problems.  ROS:   Please see the history of present illness.    All other systems reviewed and are negative.   Prior CV studies:   The following studies were reviewed today: Cath: 04/02/19  1. Severe single-vessel coronary artery disease with critical stenosis of the mid LAD, tandem lesions treated with a long 2.5 x 38 mm resolute Onyx DES 2. Patent left main and left circumflex without significant stenosis 3. Moderate mid RCA stenosis, does not appear flow obstructive 4. Mild segmental LV contraction abnormality with hypokinesis of the distal anterior wall and anteroapex, LVEF estimated at 50 to 55%  Recommendation: Dual antiplatelet therapy with aspirin and ticagrelor x12 months without interruption. Aggressive risk reduction measures. Medical therapy for residual CAD. If no complications arise, anticipate hospital discharge in 48 hours.  Diagnostic Dominance: Right  Intervention     Labs/Other Tests and Data Reviewed:    EKG:  No ECG reviewed.  Recent Labs: 04/02/2019: ALT 25 04/10/2019: BUN 20; Creatinine, Ser 1.05; Hemoglobin 13.9; Platelets 224; Potassium 3.9;  Sodium 133   Recent Lipid Panel Lab Results  Component Value Date/Time   CHOL 219 (H) 04/03/2019 06:58 AM   TRIG 384 (H) 04/03/2019 06:58 AM   HDL 27 (L) 04/03/2019 06:58 AM   CHOLHDL 8.1 04/03/2019 06:58 AM   LDLCALC 115 (H) 04/03/2019 06:58 AM   LDLCALC 85 02/19/2018 08:21 AM    Wt Readings from Last 3 Encounters:  04/14/19 160 lb (72.6 kg)  04/10/19 160 lb (72.6 kg)  04/03/19 166 lb 14.2 oz (75.7 kg)     Objective:    Vital Signs:  Ht 5\' 9"  (1.753 m)   Wt 160 lb (72.6 kg)   BMI 23.63 kg/m    Limited assessment due to telephone visit.   ASSESSMENT & PLAN:    1. CAD: S/P acute STEMI of the LAD with successful DES placement to the LAD on 04/02/2019. Now on ASA and Brilinta. He is currently having some breathing issues when he takes the Brilinta.  I have advised him to drink some caffeine if this occurs to help with symptoms. If they worsen we may have to change him to Plavix and reload him.  He reports that he has some soreness and bruising at his catheter insertion site with some tingling in his hand.  He is instructed that he may have these sensations for about 6 weeks post procedure. If symptoms worsen or he had significant bruising he is to call our office to be seen in person for assessment. He verbalizes understanding.   2. Prosthetic Aortic Valve: He will need follow up echo in one year.SBE prophylaxis is necessary.   3. Hypertension: Does not know what his BP is nor does he check it regularly. I have advised him to take it once a day and record it so that we can have an idea what he is running away from the office. He will try to remember.  4. Type II Diabetes: Does not normally check his blood glucose at home. He is to see his PCP for ongoing management.   COVID-19 Education: The signs and symptoms of COVID-19 were discussed with the patient and how to seek care for testing (follow up with PCP or arrange E-visit).  The importance of social distancing was discussed  today.  Time:   Today, I have spent 20 minutes with the  patient with telehealth technology discussing the above problems.     Medication Adjustments/Labs and Tests Ordered: Current medicines are reviewed at length with the patient today.  Concerns regarding medicines are outlined above.   Tests Ordered: No orders of the defined types were placed in this encounter.   Medication Changes: No orders of the defined types were placed in this encounter.   Disposition:  Follow up 6 months   Signed, Phill Myron. [place] Pugh, ANP, [facility]  04/14/2019 10:34 AM    [facility]

## 2019-04-14 ENCOUNTER — Telehealth (INDEPENDENT_AMBULATORY_CARE_PROVIDER_SITE_OTHER): Payer: BC Managed Care – PPO | Admitting: Adult Health

## 2019-04-14 ENCOUNTER — Encounter: Payer: Self-pay | Admitting: Adult Health

## 2019-04-14 VITALS — Ht 69.0 in | Wt 160.0 lb

## 2019-04-14 DIAGNOSIS — Z952 Presence of prosthetic heart valve: Secondary | ICD-10-CM

## 2019-04-14 DIAGNOSIS — I252 Old myocardial infarction: Secondary | ICD-10-CM

## 2019-04-14 DIAGNOSIS — E119 Type 2 diabetes mellitus without complications: Secondary | ICD-10-CM

## 2019-04-14 DIAGNOSIS — Z955 Presence of coronary angioplasty implant and graft: Secondary | ICD-10-CM | POA: Diagnosis not present

## 2019-04-14 DIAGNOSIS — E785 Hyperlipidemia, unspecified: Secondary | ICD-10-CM

## 2019-04-14 DIAGNOSIS — E1169 Type 2 diabetes mellitus with other specified complication: Secondary | ICD-10-CM

## 2019-04-14 DIAGNOSIS — I2510 Atherosclerotic heart disease of native coronary artery without angina pectoris: Secondary | ICD-10-CM | POA: Diagnosis not present

## 2019-04-14 DIAGNOSIS — I10 Essential (primary) hypertension: Secondary | ICD-10-CM

## 2019-04-14 NOTE — Patient Instructions (Signed)
Medication Instructions:  Continue current medications  *If you need a refill on your cardiac medications before your next appointment, please call your pharmacy*  Lab Work: None Ordered  Testing/Procedures: None Ordered  Follow-Up: At [facility], you and your health needs are our priority.  As part of our continuing mission to provide you with exceptional heart care, we have created designated Provider Care Teams.  These Care Teams include your primary Cardiologist (physician) and Advanced Practice Providers (APPs -  Physician Assistants and Nurse Practitioners) who all work together to provide you with the care you need, when you need it.  Your next appointment:   3 month(s)  The format for your next appointment:   In Person  Provider:   You may see Pixie Casino, MD or one of the following Advanced Practice Providers on your designated Care Team:    Almyra Deforest, PA-C  Fabian Sharp, PA-C or   Roby Lofts, [place]

## 2019-04-18 ENCOUNTER — Ambulatory Visit: Payer: BC Managed Care – PPO | Admitting: Family Medicine

## 2019-04-18 ENCOUNTER — Other Ambulatory Visit: Payer: Self-pay

## 2019-04-18 ENCOUNTER — Encounter: Payer: Self-pay | Admitting: Family Medicine

## 2019-04-18 VITALS — BP 132/84 | HR 70 | Temp 97.4°F | Resp 18 | Ht 69.0 in | Wt 170.0 lb

## 2019-04-18 DIAGNOSIS — I252 Old myocardial infarction: Secondary | ICD-10-CM | POA: Diagnosis not present

## 2019-04-18 DIAGNOSIS — I499 Cardiac arrhythmia, unspecified: Secondary | ICD-10-CM

## 2019-04-18 DIAGNOSIS — Z952 Presence of prosthetic heart valve: Secondary | ICD-10-CM

## 2019-04-18 DIAGNOSIS — I10 Essential (primary) hypertension: Secondary | ICD-10-CM | POA: Diagnosis not present

## 2019-04-18 DIAGNOSIS — E118 Type 2 diabetes mellitus with unspecified complications: Secondary | ICD-10-CM

## 2019-04-18 MED ORDER — ACCU-CHEK AVIVA VI SOLN: 3 refills | Status: AC

## 2019-04-18 MED ORDER — ACCU-CHEK AVIVA PLUS VI STRP: ORAL_STRIP | 3 refills | Status: AC

## 2019-04-18 MED ORDER — ACCU-CHEK MULTICLIX LANCETS MISC: 3 refills | Status: AC

## 2019-04-18 MED ORDER — ACCU-CHEK MULTICLIX LANCET DEV KIT: PACK | 3 refills | Status: AC

## 2019-04-18 MED ORDER — JARDIANCE 25 MG PO TABS: 25.0000 mg | ORAL_TABLET | Freq: Every day | ORAL | 5 refills | Status: DC

## 2019-04-18 MED ORDER — ACCU-CHEK AVIVA PLUS W/DEVICE KIT: PACK | 3 refills | Status: AC

## 2019-04-18 NOTE — Progress Notes (Signed)
Subjective:    Patient ID: Darrell [name], male    DOB: 06-18-1950, 69 y.o.   MRN: [id number]  Patient has not been seen regarding his diabetes in more than a year.  He essentially stopped all of his medication pertaining to his diabetes and admits that he was eating without any restrictions.  Unfortunately he was admitted to the hospital recently with a ST segment elevation myocardial infarction.  Catheterization revealed a 95% stenosis in the LAD along with an additional 90% stenosis downstream.  He also had a 50 to 55% stenosis in the right coronary artery.  Patient had a drug-eluting stent placed and was sent home on dual antiplatelet therapy with aspirin and Brilinta for 1 year.  He had not been taking his statin so he was started on Lipitor 40 mg a day.  He also is now on metoprolol.  They started the patient on Metformin 1000 mg twice a day and instructed him to follow-up with me regarding his diabetes.  His hemoglobin A1c was greater than 11.  He states that since he started the Metformin his blood sugars are still greater than 200.  He denies any hypoglycemic episodes.  He does have occasional dizziness.  He denies any syncope or near syncope or vertigo.  He does still feel weak.  He denies any polyuria, polydipsia, or blurry vision.  On examination today he does have an irregularly irregular rhythm however EKG shows frequent PACs but normal sinus rhythm with ST segment inversions in the inferior and lateral leads which are chronic compared to his EKG from February 8. Past Medical History:  Diagnosis Date  . Aortic stenosis 05/2011   Severe by recent echo; AoV area ~0.77-79 cm2, peak /mean gradient 86 mmHg/ 55 mmHg.  . Arthritis    back- low- [facility]- workmen's comp. situation   . DDD (degenerative disc disease), lumbosacral   . Depression    quick temper- per pt.   . Diverticulosis    slight -per colonoscopy, pt. asymptomatic- thus far  . GERD (gastroesophageal reflux disease)   .  Hiatal hernia   . History of BPH   . History of MRSA infection   . Hyperlipidemia   . Hypertension    [facility]- Dr. Ellyn Hack   . Insomnia   . Myocardial infarction ([place])    By report only, cardiac catheterization was negative for ischemia.  Marland Kitchen NIDDM (non-insulin dependent diabetes mellitus)    Type 2 NIDDM x 5 years  . Prostate disease   . Recurrent upper respiratory infection (URI)    cold- curently- "per pt., on the mend"  . S/P AVR (aortic valve replacement) 08/05/2011   Preop cath showed normal coronaries; AVR--Dr. Servando Snare, MD; 23 mm Magna Ease bioprosthetic Aortic Valve  . STEMI (ST elevation myocardial infarction) ([place])    PCI/DES x1 to mLAD   Past Surgical History:  Procedure Laterality Date  . ABDOMINAL EXPOSURE N/A 04/01/2012   Procedure: ABDOMINAL EXPOSURE;  Surgeon: Angelia Mould, MD;  Location: [facility];  Service: Vascular;  Laterality: N/A;  . ANTERIOR LUMBAR FUSION N/A 04/01/2012   Procedure: ANTERIOR LUMBAR FUSION 1 LEVEL;  Surgeon: [name], MD;  Location: [facility];  Service: Orthopedics;  Laterality: N/A;  ALIF L5-S1  . AORTIC VALVE REPLACEMENT  08/05/2011   Procedure: AORTIC VALVE REPLACEMENT (AVR);  Surgeon: [name], MD;  Location: [place];  Service: Open Heart Surgery;  Laterality: N/A;  . CARDIAC CATHETERIZATION  06/2011    Minimal coronary  disease  . CORONARY/GRAFT ACUTE MI REVASCULARIZATION N/A 04/02/2019   Procedure: Coronary/Graft Acute MI Revascularization;  Surgeon: [name], MD;  Location: [facility];  Service: Cardiovascular;  Laterality: N/A;  . LEFT AND RIGHT HEART CATHETERIZATION WITH CORONARY ANGIOGRAM  07/03/2011   Procedure: LEFT AND RIGHT HEART CATHETERIZATION WITH CORONARY ANGIOGRAM;  Surgeon: [name], MD;  Location: [facility];  Service: Cardiovascular;;  . LEFT HEART CATH AND CORONARY ANGIOGRAPHY N/A 04/02/2019   Procedure: LEFT HEART CATH AND CORONARY ANGIOGRAPHY;  Surgeon: [name], MD;  Location: [facility];  Service: Cardiovascular;  Laterality: N/A;  . LUMBAR FUSION  04/02/2012   Dr [name]  . NASAL SEPTUM SURGERY     [facility] 10 yrs. ago  . NM MYOVIEW LTD  April 2013   Low risk.   Current Outpatient Medications on File Prior to Visit  Medication Sig Dispense Refill  . acetaminophen (TYLENOL) 500 MG tablet Take 1,000 mg by mouth every 6 (six) hours as needed for headache (pain).    Marland Kitchen ascorbic acid (VITAMIN C) 500 MG tablet Take 500 mg by mouth daily.    Marland Kitchen aspirin EC 81 MG EC tablet Take 1 tablet (81 mg total) by mouth daily. 90 tablet 1  . atorvastatin (LIPITOR) 40 MG tablet Take 1 tablet (40 mg total) by mouth daily. 90 tablet 1  . Cholecalciferol (VITAMIN D3) 125 MCG (5000 UT) TABS Take 5,000 Units by mouth daily.    . diclofenac Sodium (VOLTAREN) 1 % GEL Apply 2 g topically 4 (four) times daily as needed. 50 g 0  . metFORMIN (GLUCOPHAGE) 1000 MG tablet Take 1 tablet (1,000 mg total) by mouth 2 (two) times daily with a meal. 60 tablet 0  . metoprolol tartrate (LOPRESSOR) 25 MG tablet Take 1 tablet (25 mg total) by mouth 2 (two) times daily. 180 tablet 0  . nitroGLYCERIN (NITROSTAT) 0.4 MG SL tablet Place 1 tablet (0.4 mg total) under the tongue every 5 (five) minutes x 3 doses as needed for chest pain. 25 tablet 2  . ticagrelor (BRILINTA) 90 MG TABS tablet Take 1 tablet (90 mg total) by mouth 2 (two) times daily. 180 tablet 2  . VITAMIN E PO Take 1 capsule by mouth daily.    . Zinc 50 MG TABS Take 50 mg by mouth daily.     No current facility-administered medications on file prior to visit.   Allergies  Allergen Reactions  . Tape Rash   Social History   Socioeconomic History  . Marital status: Married    Spouse name: Not on file  . Number of children: 5  . Years of education: Not on file  . 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 Smoking status: Never Smoker  . Smokeless  tobacco: Never Used  Substance and Sexual Activity  . Alcohol use: No  . Drug use: No  . Sexual activity: Yes  Other Topics Concern  . 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 HE  HAS 16 GRANDCHILDREN AND 5 GREAT GRAND CHILDREN . HE  EXERCISES  NOW WALKING ABOUT 5-6 TIMES A WEEK ABOUT 30 MINUTES AT A TIME AND HAS NO PROBLEM. HE DOES NOT SMOKE ,DOES NOT DRINK ALCOHOL.   He mostly enjoys going  out for walks with his grandkids.           Social Determinants of Health   Financial Resource Strain:   . Difficulty of Paying Living Expenses: Not on file  Food Insecurity:   . Worried About Charity fundraiser in the Last Year: Not on file  . Ran Out of Food in the Last Year: Not on file  Transportation Needs:   . Lack of Transportation (Medical): Not on file  . Lack of Transportation (Non-Medical): Not on file  Physical Activity:   . Days of Exercise per Week: Not on file  . Minutes of Exercise per Session: Not on file  Stress:   . Feeling of Stress : Not on file  Social Connections:   . Frequency of Communication with Friends and Family: Not on file  . Frequency of Social Gatherings with Friends and Family: Not on file  . Attends Religious Services: Not on file  . Active Member of Clubs or Organizations: Not on file  . Attends Archivist Meetings: Not on file  . Marital Status: Not on file  Intimate Partner Violence:   . Fear of Current or Ex-Partner: Not on file  . Emotionally Abused: Not on file  . Physically Abused: Not on file  . Sexually Abused: Not on file      Review of Systems  All other systems reviewed and are negative.      Objective:   Physical Exam  Constitutional: He appears well-developed and well-nourished.  Neck: No JVD present.  Cardiovascular: Normal rate and regular rhythm.  Murmur heard. Pulmonary/Chest: Effort normal and breath sounds normal. No respiratory  distress. He has no wheezes. He has no rales.  Abdominal: Soft. Bowel sounds are normal. He exhibits no distension. There is no abdominal tenderness. There is no rebound.  Musculoskeletal:        General: No edema.  Vitals reviewed.         Assessment & Plan:  Diabetes mellitus type 2 with complications (HCC)  S/P AVR (aortic valve replacement) - for severe AS;   Benign essential HTN  History of MI (myocardial infarction)  Irregular heart beat - Plan: EKG 12-Lead  Continue Metformin 1000 mg twice daily.  Given his recent myocardial infarction and known coronary artery disease I have also added Jardiance 25 mg a day and recommended strict dietary control avoiding foods rich in carbohydrates.  Blood pressure today is acceptable.  He is now on Lipitor 40 mg a day.  His LDL cholesterol was greater than 100 in the hospital.  I would recommend rechecking his LDL cholesterol in 3 months and his goal LDL cholesterol is less than 70.  I will see the patient back in clinic in 1 month to review his fasting blood sugars and 2-hour postprandial sugars.  At that time, if his sugars are still out of control we will likely add Trulicity or glipizide depending on cost and coverage.

## 2019-05-02 ENCOUNTER — Other Ambulatory Visit: Payer: Self-pay | Admitting: *Deleted

## 2019-05-02 MED ORDER — METFORMIN HCL 1000 MG PO TABS: 1000.0000 mg | ORAL_TABLET | Freq: Two times a day (BID) | ORAL | 0 refills | Status: DC

## 2019-05-16 ENCOUNTER — Encounter: Payer: Self-pay | Admitting: Family Medicine

## 2019-05-16 ENCOUNTER — Other Ambulatory Visit: Payer: Self-pay

## 2019-05-16 ENCOUNTER — Ambulatory Visit (INDEPENDENT_AMBULATORY_CARE_PROVIDER_SITE_OTHER): Payer: BC Managed Care – PPO | Admitting: Family Medicine

## 2019-05-16 VITALS — BP 160/80 | HR 66 | Temp 97.5°F | Resp 16 | Ht 69.0 in | Wt 169.0 lb

## 2019-05-16 DIAGNOSIS — I2510 Atherosclerotic heart disease of native coronary artery without angina pectoris: Secondary | ICD-10-CM

## 2019-05-16 DIAGNOSIS — E118 Type 2 diabetes mellitus with unspecified complications: Secondary | ICD-10-CM | POA: Diagnosis not present

## 2019-05-16 DIAGNOSIS — I252 Old myocardial infarction: Secondary | ICD-10-CM

## 2019-05-16 DIAGNOSIS — I10 Essential (primary) hypertension: Secondary | ICD-10-CM | POA: Diagnosis not present

## 2019-05-16 MED ORDER — GLIPIZIDE ER 5 MG PO TB24: 5.0000 mg | ORAL_TABLET | Freq: Every day | ORAL | 4 refills | Status: DC

## 2019-05-16 NOTE — Progress Notes (Signed)
Subjective:    Patient ID: Darrell [name], male    DOB: 1951/01/27, 69 y.o.   MRN: [id number] 04/18/19 Patient has not been seen regarding his diabetes in more than a year.  He essentially stopped all of his medication pertaining to his diabetes and admits that he was eating without any restrictions.  Unfortunately he was admitted to the hospital recently with a ST segment elevation myocardial infarction.  Catheterization revealed a 95% stenosis in the LAD along with an additional 90% stenosis downstream.  He also had a 50 to 55% stenosis in the right coronary artery.  Patient had a drug-eluting stent placed and was sent home on dual antiplatelet therapy with aspirin and Brilinta for 1 year.  He had not been taking his statin so he was started on Lipitor 40 mg a day.  He also is now on metoprolol.  They started the patient on Metformin 1000 mg twice a day and instructed him to follow-up with me regarding his diabetes.  His hemoglobin A1c was greater than 11.  He states that since he started the Metformin his blood sugars are still greater than 200.  He denies any hypoglycemic episodes.  He does have occasional dizziness.  He denies any syncope or near syncope or vertigo.  He does still feel weak.  He denies any polyuria, polydipsia, or blurry vision.  On examination today he does have an irregularly irregular rhythm however EKG shows frequent PACs but normal sinus rhythm with ST segment inversions in the inferior and lateral leads which are chronic compared to his EKG from February 8.  At that time, my plan was: Continue Metformin 1000 mg twice daily.  Given his recent myocardial infarction and known coronary artery disease I have also added Jardiance 25 mg a day and recommended strict dietary control avoiding foods rich in carbohydrates.  Blood pressure today is acceptable.  He is now on Lipitor 40 mg a day.  His LDL cholesterol was greater than 100 in the hospital.  I would recommend rechecking his LDL  cholesterol in 3 months and his goal LDL cholesterol is less than 70.  I will see the patient back in clinic in 1 month to review his fasting blood sugars and 2-hour postprandial sugars.  At that time, if his sugars are still out of control we will likely add Trulicity or glipizide depending on cost and coverage.  05/16/19 Patient has no blood sugars for me to review today.  He states his blood sugar this morning was 118.  He states however that his sugars at random times are 200-2 20.  He states the majority of time they are greater than 200.  He denies any hypoglycemic episodes.  He is not checking his sugar regularly and he admits to eating sweets and junk food.  Blood pressure today is 160/80 Past Medical History:  Diagnosis Date  . Aortic stenosis 05/2011   Severe by recent echo; AoV area ~0.77-79 cm2, peak /mean gradient 86 mmHg/ 55 mmHg.  . Arthritis    back- low- [facility]- workmen's comp. situation   . DDD (degenerative disc disease), lumbosacral   . Depression    quick temper- per pt.   . Diverticulosis    slight -per colonoscopy, pt. asymptomatic- thus far  . GERD (gastroesophageal reflux disease)   . Hiatal hernia   . History of BPH   . History of MRSA infection   . Hyperlipidemia   . Hypertension    [facility]- Dr. Ellyn Hack   .  Insomnia   . Myocardial infarction ([place])    By report only, cardiac catheterization was negative for ischemia.  Marland Kitchen NIDDM (non-insulin dependent diabetes mellitus)    Type 2 NIDDM x 5 years  . Prostate disease   . Recurrent upper respiratory infection (URI)    cold- curently- "per pt., on the mend"  . S/P AVR (aortic valve replacement) 08/05/2011   Preop cath showed normal coronaries; AVR--Dr. Servando Snare, MD; 23 mm Magna Ease bioprosthetic Aortic Valve  . STEMI (ST elevation myocardial infarction) ([place])    PCI/DES x1 to mLAD   Past Surgical History:  Procedure Laterality Date  . ABDOMINAL EXPOSURE N/A 04/01/2012   Procedure: ABDOMINAL EXPOSURE;   Surgeon: Angelia Mould, MD;  Location: [facility];  Service: Vascular;  Laterality: N/A;  . ANTERIOR LUMBAR FUSION N/A 04/01/2012   Procedure: ANTERIOR LUMBAR FUSION 1 LEVEL;  Surgeon: [name], MD;  Location: [facility];  Service: Orthopedics;  Laterality: N/A;  ALIF L5-S1  . AORTIC VALVE REPLACEMENT  08/05/2011   Procedure: AORTIC VALVE REPLACEMENT (AVR);  Surgeon: [name], MD;  Location: [facility];  Service: Open Heart Surgery;  Laterality: N/A;  . CARDIAC CATHETERIZATION  06/2011    Minimal coronary disease  . CORONARY/GRAFT ACUTE MI REVASCULARIZATION N/A 04/02/2019   Procedure: Coronary/Graft Acute MI Revascularization;  Surgeon: [name], MD;  Location: [facility];  Service: Cardiovascular;  Laterality: N/A;  . LEFT AND RIGHT HEART CATHETERIZATION WITH CORONARY ANGIOGRAM  07/03/2011   Procedure: LEFT AND RIGHT HEART CATHETERIZATION WITH CORONARY ANGIOGRAM;  Surgeon: [name], MD;  Location: [facility];  Service: Cardiovascular;;  . LEFT HEART CATH AND CORONARY ANGIOGRAPHY N/A 04/02/2019   Procedure: LEFT HEART CATH AND CORONARY ANGIOGRAPHY;  Surgeon: [name], MD;  Location: [facility];  Service: Cardiovascular;  Laterality: N/A;  . LUMBAR FUSION  04/02/2012   Dr [name]  . NASAL SEPTUM SURGERY     [facility]- 10 yrs. ago  . NM MYOVIEW LTD  April 2013   Low risk.   Current Outpatient Medications on File Prior to Visit  Medication Sig Dispense Refill  . acetaminophen (TYLENOL) 500 MG tablet Take 1,000 mg by mouth every 6 (six) hours as needed for headache (pain).    Marland Kitchen ascorbic acid (VITAMIN C) 500 MG tablet Take 500 mg by mouth daily.    Marland Kitchen aspirin EC 81 MG EC tablet Take 1 tablet (81 mg total) by mouth daily. 90 tablet 1  . atorvastatin (LIPITOR) 40 MG tablet Take 1 tablet (40 mg total) by mouth daily. 90 tablet 1  . Blood Glucose Calibration (ACCU-CHEK AVIVA) SOLN Check BS bid DX: E11.9 1 each 3  . Blood Glucose Monitoring Suppl (ACCU-CHEK  AVIVA PLUS) w/Device KIT Check BS bid DX: E11.9 1 kit 3  . Cholecalciferol (VITAMIN D3) 125 MCG (5000 UT) TABS Take 5,000 Units by mouth daily.    . diclofenac Sodium (VOLTAREN) 1 % GEL Apply 2 g topically 4 (four) times daily as needed. 50 g 0  . empagliflozin (JARDIANCE) 25 MG TABS tablet Take 25 mg by mouth daily before breakfast. 30 tablet 5  . glucose blood (ACCU-CHEK AVIVA PLUS) test strip Check BS bid DX: E11.9 200 each 3  . Lancets (ACCU-CHEK MULTICLIX) lancets Check BS bid DX: E11.9 100 each 3  . Lancets Misc. (ACCU-CHEK MULTICLIX LANCET DEV) KIT Check BS bid DX: E11.9 1 kit 3  . metFORMIN (GLUCOPHAGE) 1000 MG tablet Take 1 tablet (  1,000 mg total) by mouth 2 (two) times daily with a meal. 60 tablet 0  . metoprolol tartrate (LOPRESSOR) 25 MG tablet Take 1 tablet (25 mg total) by mouth 2 (two) times daily. 180 tablet 0  . nitroGLYCERIN (NITROSTAT) 0.4 MG SL tablet Place 1 tablet (0.4 mg total) under the tongue every 5 (five) minutes x 3 doses as needed for chest pain. 25 tablet 2  . ticagrelor (BRILINTA) 90 MG TABS tablet Take 1 tablet (90 mg total) by mouth 2 (two) times daily. 180 tablet 2  . VITAMIN E PO Take 1 capsule by mouth daily.    . Zinc 50 MG TABS Take 50 mg by mouth daily.     No current facility-administered medications on file prior to visit.   Allergies  Allergen Reactions  . Tape Rash   Social History   Socioeconomic History  . Marital status: Married    Spouse name: Not on file  . Number of children: 5  . Years of education: Not on file  . 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 Smoking status: Never Smoker  . Smokeless tobacco: Never Used  Substance and Sexual Activity  . Alcohol use: No  . Drug use: No  . Sexual activity: Yes  Other Topics Concern  . 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  HE  HAS 16 GRANDCHILDREN AND 5 GREAT GRAND CHILDREN . HE  EXERCISES  NOW WALKING ABOUT 5-6 TIMES A WEEK ABOUT 30 MINUTES AT A TIME AND HAS NO PROBLEM. HE DOES NOT SMOKE ,DOES NOT DRINK ALCOHOL.   He mostly enjoys going out for walks with his grandkids.           Social Determinants of Health   Financial Resource Strain:   . Difficulty of Paying Living Expenses:   Food Insecurity:   . Worried About Charity fundraiser in the Last Year:   . Arboriculturist in the Last Year:   Transportation Needs:   . Film/video editor (Medical):   Marland Kitchen Lack of Transportation (Non-Medical):   Physical Activity:   . Days of Exercise per Week:   . Minutes of Exercise per Session:   Stress:   . Feeling of Stress :   Social Connections:   . Frequency of Communication with Friends and Family:   . Frequency of Social Gatherings with Friends and Family:   . Attends Religious Services:   . Active Member of Clubs or Organizations:   . Attends Archivist Meetings:   Marland Kitchen Marital Status:   Intimate Partner Violence:   . Fear of Current or Ex-Partner:   . Emotionally Abused:   Marland Kitchen Physically Abused:   . Sexually Abused:       Review of Systems  All other systems reviewed and are negative.      Objective:   Physical Exam  Constitutional: He appears well-developed and well-nourished.  Neck: No JVD present.  Cardiovascular: Normal rate and regular rhythm.  Murmur heard. Pulmonary/Chest: Effort normal and breath sounds normal. No respiratory distress. He has no wheezes. He has no rales.  Abdominal: Soft. Bowel sounds are normal. He exhibits no distension. There is no abdominal tenderness. There is no rebound.  Musculoskeletal:        General:  No edema.  Vitals reviewed.         Assessment & Plan:  Diabetes mellitus type 2 with complications (HCC)  History of MI (myocardial infarction)  Benign essential HTN  Diabetes is uncontrolled.  Add glipizide extended release 5 mg p.o. every  morning in addition to his Metformin and Jardiance.  Strongly encouraged the patient to be compliant with a low carbohydrate diet and explained the foods that he needed to try to avoid.  Recheck fasting blood sugar and 2-hour postprandial sugars in 1 month to further titrate his medication.  However he will be unable to help the patient if he does not check his blood sugar provide me data to monitor.  Blood pressure today is extremely high.  Patient will check his blood pressure twice a day and call on Friday with results.  If greater than 140/90, I would add an ACE inhibitor such as lisinopril 20 mg a day.

## 2019-05-24 ENCOUNTER — Other Ambulatory Visit: Payer: Self-pay | Admitting: Family Medicine

## 2019-05-30 ENCOUNTER — Telehealth: Payer: Self-pay | Admitting: Family Medicine

## 2019-05-30 NOTE — Telephone Encounter (Signed)
Pt called [facility] stating that he has been monitoring his blood sugar and fasting average is around 123 and after dinner is around 241.

## 2019-05-30 NOTE — Telephone Encounter (Signed)
Also wants to know if he can take Dramamine for dizziness with all of his medications?

## 2019-05-31 NOTE — Telephone Encounter (Signed)
Tried to call - no answer and mailbox is full.  

## 2019-05-31 NOTE — Telephone Encounter (Signed)
After dinner is too high.  Add actos 30 mg poqday and recheck hga1c in 3 months.  Yes he can take dramamine

## 2019-06-08 MED ORDER — PIOGLITAZONE HCL 30 MG PO TABS: 30.0000 mg | ORAL_TABLET | Freq: Every day | ORAL | 1 refills | Status: DC

## 2019-06-08 NOTE — Telephone Encounter (Signed)
Patient aware of providers recommendations and med sent to pharm 

## 2019-06-14 ENCOUNTER — Telehealth: Payer: Self-pay | Admitting: Family Medicine

## 2019-06-14 ENCOUNTER — Other Ambulatory Visit: Payer: Self-pay | Admitting: Family Medicine

## 2019-06-14 MED ORDER — METFORMIN HCL 1000 MG PO TABS: ORAL_TABLET | ORAL | 2 refills | Status: DC

## 2019-06-14 MED ORDER — METFORMIN HCL ER 500 MG PO TB24: 2000.0000 mg | ORAL_TABLET | Freq: Every day | ORAL | 3 refills | Status: DC

## 2019-06-14 NOTE — Telephone Encounter (Signed)
Med sent to pharm 

## 2019-07-14 ENCOUNTER — Ambulatory Visit: Payer: BC Managed Care – PPO | Admitting: Internal Medicine

## 2019-07-15 ENCOUNTER — Other Ambulatory Visit: Payer: Self-pay | Admitting: Cardiology

## 2019-07-22 ENCOUNTER — Ambulatory Visit: Payer: Medicare Other | Admitting: Family Medicine

## 2019-07-26 ENCOUNTER — Other Ambulatory Visit: Payer: Self-pay

## 2019-07-26 ENCOUNTER — Ambulatory Visit (INDEPENDENT_AMBULATORY_CARE_PROVIDER_SITE_OTHER): Payer: BC Managed Care – PPO | Admitting: Family Medicine

## 2019-07-26 ENCOUNTER — Encounter: Payer: Self-pay | Admitting: Family Medicine

## 2019-07-26 VITALS — BP 140/66 | HR 90 | Temp 97.5°F | Ht 69.0 in | Wt 176.0 lb

## 2019-07-26 DIAGNOSIS — I10 Essential (primary) hypertension: Secondary | ICD-10-CM | POA: Diagnosis not present

## 2019-07-26 DIAGNOSIS — Z952 Presence of prosthetic heart valve: Secondary | ICD-10-CM | POA: Diagnosis not present

## 2019-07-26 DIAGNOSIS — E118 Type 2 diabetes mellitus with unspecified complications: Secondary | ICD-10-CM

## 2019-07-26 DIAGNOSIS — I252 Old myocardial infarction: Secondary | ICD-10-CM

## 2019-07-26 DIAGNOSIS — I2510 Atherosclerotic heart disease of native coronary artery without angina pectoris: Secondary | ICD-10-CM

## 2019-07-26 DIAGNOSIS — G3281 Cerebellar ataxia in diseases classified elsewhere: Secondary | ICD-10-CM

## 2019-07-26 MED ORDER — METOPROLOL TARTRATE 25 MG PO TABS: 25.0000 mg | ORAL_TABLET | Freq: Two times a day (BID) | ORAL | 3 refills | Status: DC

## 2019-07-26 NOTE — Progress Notes (Signed)
Subjective:    Patient ID: Darrell [name], male    DOB: [birth year], 69 y.o.   MRN: [id number] 04/18/19 Patient has not been seen regarding his diabetes in more than a year.  He essentially stopped all of his medication pertaining to his diabetes and admits that he was eating without any restrictions.  Unfortunately he was admitted to the hospital recently with a ST segment elevation myocardial infarction.  Catheterization revealed a 95% stenosis in the LAD along with an additional 90% stenosis downstream.  He also had a 50 to 55% stenosis in the right coronary artery.  Patient had a drug-eluting stent placed and was sent home on dual antiplatelet therapy with aspirin and Brilinta for 1 year.  He had not been taking his statin so he was started on Lipitor 40 mg a day.  He also is now on metoprolol.  They started the patient on Metformin 1000 mg twice a day and instructed him to follow-up with me regarding his diabetes.  His hemoglobin A1c was greater than 11.  He states that since he started the Metformin his blood sugars are still greater than 200.  He denies any hypoglycemic episodes.  He does have occasional dizziness.  He denies any syncope or near syncope or vertigo.  He does still feel weak.  He denies any polyuria, polydipsia, or blurry vision.  On examination today he does have an irregularly irregular rhythm however EKG shows frequent PACs but normal sinus rhythm with ST segment inversions in the inferior and lateral leads which are chronic compared to his EKG from February 8.  At that time, my plan was: Continue Metformin 1000 mg twice daily.  Given his recent myocardial infarction and known coronary artery disease I have also added Jardiance 25 mg a day and recommended strict dietary control avoiding foods rich in carbohydrates.  Blood pressure today is acceptable.  He is now on Lipitor 40 mg a day.  His LDL cholesterol was greater than 100 in the hospital.  I would recommend rechecking his LDL  cholesterol in 3 months and his goal LDL cholesterol is less than 70.  I will see the patient back in clinic in 1 month to review his fasting blood sugars and 2-hour postprandial sugars.  At that time, if his sugars are still out of control we will likely add Trulicity or glipizide depending on cost and coverage.  05/16/19 Patient has no blood sugars for me to review today.  He states his blood sugar this morning was 118.  He states however that his sugars at random times are 200-2 20.  He states the majority of time they are greater than 200.  He denies any hypoglycemic episodes.  He is not checking his sugar regularly and he admits to eating sweets and junk food.  Blood pressure today is 160/80  07/26/19 Patient reports feeling dizzy.  He states that he has felt this way for several months.  He reports to occasionally staggering into walls when he turns while walking.  He often loses his balance when he stands up in the morning.  He denies any vertigo.  He denies any syncope.  He denies any near syncope.  He denies any palpitation.  Today on exam, we tried heel-to-toe walk.  Patient frequently had to put his other foot down with a wider base in order to keep from falling.  He could do finger-to-nose testing without any difficulty however when he turned to walk in the exam room he  had to grab onto the door frame to keep his balance indicating mild ataxia.  He denies any vertigo.  He denies any tinnitus.  He denies any chest pain.  Past medical history is significant for aortic stenosis status post valve replacement, hypertension, diabetes that has been poorly controlled, and hyperlipidemia.  He also has a history of coronary artery disease and myocardial infarction. Past Medical History:  Diagnosis Date  . Aortic stenosis 05/2011   Severe by recent echo; AoV area ~0.77-79 cm2, peak /mean gradient 86 mmHg/ 55 mmHg.  . Arthritis    back- low- [facility]- workmen's comp. situation   . DDD (degenerative  disc disease), lumbosacral   . Depression    quick temper- per pt.   . Diverticulosis    slight -per colonoscopy, pt. asymptomatic- thus far  . GERD (gastroesophageal reflux disease)   . Hiatal hernia   . History of BPH   . History of MRSA infection   . Hyperlipidemia   . Hypertension    [facility]- Dr. Ellyn Hack   . Insomnia   . Myocardial infarction ([place])    By report only, cardiac catheterization was negative for ischemia.  Marland Kitchen NIDDM (non-insulin dependent diabetes mellitus)    Type 2 NIDDM x 5 years  . Prostate disease   . Recurrent upper respiratory infection (URI)    cold- curently- "per pt., on the mend"  . S/P AVR (aortic valve replacement) 08/05/2011   Preop cath showed normal coronaries; AVR--Dr. Servando Snare, MD; 23 mm Magna Ease bioprosthetic Aortic Valve  . STEMI (ST elevation myocardial infarction) ([place])    PCI/DES x1 to mLAD   Past Surgical History:  Procedure Laterality Date  . ABDOMINAL EXPOSURE N/A 04/01/2012   Procedure: ABDOMINAL EXPOSURE;  Surgeon: Angelia Mould, MD;  Location: [facility];  Service: Vascular;  Laterality: N/A;  . ANTERIOR LUMBAR FUSION N/A 04/01/2012   Procedure: ANTERIOR LUMBAR FUSION 1 LEVEL;  Surgeon: [name], MD;  Location: [facility];  Service: Orthopedics;  Laterality: N/A;  ALIF L5-S1  . AORTIC VALVE REPLACEMENT  08/05/2011   Procedure: AORTIC VALVE REPLACEMENT (AVR);  Surgeon: [name], MD;  Location: [facility];  Service: Open Heart Surgery;  Laterality: N/A;  . CARDIAC CATHETERIZATION  06/2011    Minimal coronary disease  . CORONARY/GRAFT ACUTE MI REVASCULARIZATION N/A 04/02/2019   Procedure: Coronary/Graft Acute MI Revascularization;  Surgeon: [name], MD;  Location: [facility];  Service: Cardiovascular;  Laterality: N/A;  . LEFT AND RIGHT HEART CATHETERIZATION WITH CORONARY ANGIOGRAM  07/03/2011   Procedure: LEFT AND RIGHT HEART CATHETERIZATION WITH CORONARY ANGIOGRAM;  Surgeon: [name], MD;  Location: [facility];   Service: Cardiovascular;;  . LEFT HEART CATH AND CORONARY ANGIOGRAPHY N/A 04/02/2019   Procedure: LEFT HEART CATH AND CORONARY ANGIOGRAPHY;  Surgeon: [name], MD;  Location: [facility];  Service: Cardiovascular;  Laterality: N/A;  . LUMBAR FUSION  04/02/2012   Dr [name]  . NASAL SEPTUM SURGERY     [facility] 10 yrs. ago  . NM MYOVIEW LTD  April 2013   Low risk.   Current Outpatient Medications on File Prior to Visit  Medication Sig Dispense Refill  . acetaminophen (TYLENOL) 500 MG tablet Take 1,000 mg by mouth every 6 (six) hours as needed for headache (pain).    Marland Kitchen ascorbic acid (VITAMIN C) 500 MG tablet Take 500 mg by mouth daily.    Marland Kitchen aspirin EC 81 MG EC tablet Take 1  tablet (81 mg total) by mouth daily. 90 tablet 1  . atorvastatin (LIPITOR) 40 MG tablet Take 1 tablet (40 mg total) by mouth daily. 90 tablet 1  . Blood Glucose Calibration (ACCU-CHEK AVIVA) SOLN Check BS bid DX: E11.9 1 each 3  . Blood Glucose Monitoring Suppl (ACCU-CHEK AVIVA PLUS) w/Device KIT Check BS bid DX: E11.9 1 kit 3  . BRILINTA 90 MG TABS tablet TAKE 1 TABLET BY MOUTH TWICE DAILY 180 tablet 1  . Cholecalciferol (VITAMIN D3) 125 MCG (5000 UT) TABS Take 5,000 Units by mouth daily.    Marland Kitchen glipiZIDE (GLUCOTROL XL) 5 MG 24 hr tablet Take 1 tablet (5 mg total) by mouth daily with breakfast. 30 tablet 4  . glucose blood (ACCU-CHEK AVIVA PLUS) test strip Check BS bid DX: E11.9 200 each 3  . JARDIANCE 25 MG TABS tablet TAKE 1 TABLET BY MOUTH BEFORE breakfast 30 tablet 5  . Lancets (ACCU-CHEK MULTICLIX) lancets Check BS bid DX: E11.9 100 each 3  . Lancets Misc. (ACCU-CHEK MULTICLIX LANCET DEV) KIT Check BS bid DX: E11.9 1 kit 3  . metFORMIN (GLUCOPHAGE XR) 500 MG 24 hr tablet Take 4 tablets (2,000 mg total) by mouth daily with breakfast. 120 tablet 3  . pioglitazone (ACTOS) 30 MG tablet Take 1 tablet (30 mg total) by mouth daily. 90 tablet 1  . VITAMIN E PO Take 1 capsule by mouth daily.    . Zinc 50  MG TABS Take 50 mg by mouth daily.    . diclofenac Sodium (VOLTAREN) 1 % GEL Apply 2 g topically 4 (four) times daily as needed. (Patient not taking: Reported on 07/26/2019) 50 g 0  . nitroGLYCERIN (NITROSTAT) 0.4 MG SL tablet Place 1 tablet (0.4 mg total) under the tongue every 5 (five) minutes x 3 doses as needed for chest pain. (Patient not taking: Reported on 07/26/2019) 25 tablet 2   No current facility-administered medications on file prior to visit.   Allergies  Allergen Reactions  . Tape Rash   Social History   Socioeconomic History  . Marital status: Married    Spouse name: Not on file  . Number of children: 5  . Years of education: Not on file  . Highest education level: Not on file  Occupational History    Employer: [employer]    Comment: [employer]  Tobacco Use  . Smoking status: Never Smoker  . Smokeless tobacco: Never Used  Substance and Sexual Activity  . Alcohol use: No  . Drug use: No  . Sexual activity: Yes  Other Topics Concern  . 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 HE  HAS 16 GRANDCHILDREN AND 5 GREAT GRAND CHILDREN . HE  EXERCISES  NOW WALKING ABOUT 5-6 TIMES A WEEK ABOUT 30 MINUTES AT A TIME AND HAS NO PROBLEM. HE DOES NOT SMOKE ,DOES NOT DRINK ALCOHOL.   He mostly enjoys going out for walks with his grandkids.           Social Determinants of Health   Financial Resource Strain:   . Difficulty of Paying Living Expenses:   Food Insecurity:   . Worried About Charity fundraiser in the Last Year:   . Arboriculturist in the Last Year:   Transportation Needs:   . Film/video editor (Medical):   Marland Kitchen Lack of Transportation (Non-Medical):   Physical Activity:   .  Days of Exercise per Week:   . Minutes of Exercise per Session:   Stress:   . Feeling of Stress :   Social Connections:   . Frequency of Communication with Friends and Family:   . Frequency of Social Gatherings  with Friends and Family:   . Attends Religious Services:   . Active Member of Clubs or Organizations:   . Attends Archivist Meetings:   Marland Kitchen Marital Status:   Intimate Partner Violence:   . Fear of Current or Ex-Partner:   . Emotionally Abused:   Marland Kitchen Physically Abused:   . Sexually Abused:       Review of Systems  All other systems reviewed and are negative.      Objective:   Physical Exam  Constitutional: He is oriented to person, place, and time. He appears well-developed and well-nourished. No distress.  HENT:  Mouth/Throat: Oropharynx is clear and moist. No oropharyngeal exudate.  Neck: No JVD present. No thyromegaly present.  Cardiovascular: Normal rate and regular rhythm. Exam reveals no friction rub.  Murmur heard. Pulmonary/Chest: Effort normal and breath sounds normal. No respiratory distress. He has no wheezes. He has no rales.  Abdominal: Soft. Bowel sounds are normal. He exhibits no distension. There is no abdominal tenderness. There is no rebound.  Musculoskeletal:        General: No edema.     Cervical back: Neck supple.  Lymphadenopathy:    He has no cervical adenopathy.  Neurological: He is alert and oriented to person, place, and time. He has normal reflexes. He displays normal reflexes. No cranial nerve deficit. He exhibits normal muscle tone. Coordination abnormal.  Skin: He is not diaphoretic.  Vitals reviewed.         Assessment & Plan:  Diabetes mellitus type 2 with complications ([place]) - Plan: Hemoglobin A1c, CBC with Differential/Platelet, COMPLETE METABOLIC PANEL WITH GFR, LDL Cholesterol, Direct  History of MI (myocardial infarction)  Benign essential HTN  S/P AVR (aortic valve replacement) - for severe AS;   Schedule the patient for an MRI of the brain to evaluate for possible small stroke.  Symptoms been present now for several months and the patient does have mild ataxia.  Patient has high risk factors for stroke including coronary  artery disease, poorly controlled diabetes, hypertension.  Meanwhile assess for control of his risk factors by checking a hemoglobin A1c, direct LDL.  Blood pressure today is borderline at 140/66.

## 2019-07-27 LAB — COMPLETE METABOLIC PANEL WITH GFR
AG Ratio: 1.6 (calc) (ref 1.0–2.5)
ALT: 14 U/L (ref 9–46)
AST: 15 U/L (ref 10–35)
Albumin: 3.9 g/dL (ref 3.6–5.1)
Alkaline phosphatase (APISO): 48 U/L (ref 35–144)
BUN: 17 mg/dL (ref 7–25)
CO2: 24 mmol/L (ref 20–32)
Calcium: 9.3 mg/dL (ref 8.6–10.3)
Chloride: 105 mmol/L (ref 98–110)
Creat: 0.98 mg/dL (ref 0.70–1.18)
GFR, Est African American: 90 mL/min/{1.73_m2} (ref >=60)
GFR, Est Non African American: 78 mL/min/{1.73_m2} (ref >=60)
Globulin: 2.4 g/dL (calc) (ref 1.9–3.7)
Glucose, Bld: 151 mg/dL — ABNORMAL HIGH (ref 65–99)
Potassium: 4.1 mmol/L (ref 3.5–5.3)
Sodium: 141 mmol/L (ref 135–146)
Total Bilirubin: 0.3 mg/dL (ref 0.2–1.2)
Total Protein: 6.3 g/dL (ref 6.1–8.1)

## 2019-07-27 LAB — CBC WITH DIFFERENTIAL/PLATELET
Absolute Monocytes: 496 cells/uL (ref 200–950)
Basophils Absolute: 27 cells/uL (ref 0–200)
Basophils Relative: 0.4 %
Eosinophils Absolute: 340 cells/uL (ref 15–500)
Eosinophils Relative: 5 %
HCT: 43.7 % (ref 38.5–50.0)
Hemoglobin: 14.5 g/dL (ref 13.2–17.1)
Lymphs Abs: 2346 cells/uL (ref 850–3900)
MCH: 32.6 pg (ref 27.0–33.0)
MCHC: 33.2 g/dL (ref 32.0–36.0)
MCV: 98.2 fL (ref 80.0–100.0)
MPV: 10.3 fL (ref 7.5–12.5)
Monocytes Relative: 7.3 %
Neutro Abs: 3590 cells/uL (ref 1500–7800)
Neutrophils Relative %: 52.8 %
Platelets: 165 10*3/uL (ref 140–400)
RBC: 4.45 10*6/uL (ref 4.20–5.80)
RDW: 12.4 % (ref 11.0–15.0)
Total Lymphocyte: 34.5 %
WBC: 6.8 10*3/uL (ref 3.8–10.8)

## 2019-07-27 LAB — HEMOGLOBIN A1C
Hgb A1c MFr Bld: 8.1 % of total Hgb — ABNORMAL HIGH (ref <5.7)
Mean Plasma Glucose: 186 (calc)
eAG (mmol/L): 10.3 (calc)

## 2019-07-27 LAB — LDL CHOLESTEROL, DIRECT: Direct LDL: 134 mg/dL — ABNORMAL HIGH (ref <100)

## 2019-07-28 ENCOUNTER — Other Ambulatory Visit: Payer: Self-pay

## 2019-07-28 MED ORDER — SITAGLIPTIN PHOSPHATE 100 MG PO TABS: 100.0000 mg | ORAL_TABLET | Freq: Every day | ORAL | 1 refills | Status: DC

## 2019-07-28 MED ORDER — ATORVASTATIN CALCIUM 80 MG PO TABS: 80.0000 mg | ORAL_TABLET | Freq: Every day | ORAL | 3 refills | Status: AC

## 2019-08-08 ENCOUNTER — Telehealth: Payer: Self-pay | Admitting: Internal Medicine

## 2019-08-08 NOTE — Telephone Encounter (Signed)
Called patient who stopped Brilinta b/c he was unsure what the medication was for. Explained what this medication is for and that he should be taking this + ASA 81mg  uninterrupted for 12 months. Also explained other medications and importance of these. He has been off Brilinta for 1.5 weeks and said he will resume. He has no acute concerns.

## 2019-08-08 NOTE — Telephone Encounter (Signed)
Pt c/o medication issue:  1. Name of Medication: BRILINTA 90 MG TABS tablet  2. How are you currently taking this medication (dosage and times per day)? Pt states he has stopped taking this medicatoion  3. Are you having a reaction (difficulty breathing--STAT)? No  4. What is your medication issue? Pharmacy called. The pharmacy did a wellness call for the Diabetes Program the patient is enrolled in and the patient told the pharmacy that the patient has stopped taking this medication. The pharmacy wanted to know if Dr. Debara Pickett was aware of this or not. The [facility] [phone]

## 2019-09-05 ENCOUNTER — Other Ambulatory Visit: Payer: BC Managed Care – PPO

## 2019-09-08 ENCOUNTER — Other Ambulatory Visit: Payer: Self-pay | Admitting: Family Medicine

## 2019-10-09 ENCOUNTER — Other Ambulatory Visit: Payer: Self-pay | Admitting: Family Medicine

## 2020-01-11 ENCOUNTER — Other Ambulatory Visit: Payer: Self-pay | Admitting: Family Medicine

## 2020-02-14 ENCOUNTER — Other Ambulatory Visit: Payer: Self-pay | Admitting: Family Medicine

## 2020-03-13 ENCOUNTER — Ambulatory Visit: Payer: Medicare Other | Admitting: General Surgery

## 2020-03-27 ENCOUNTER — Other Ambulatory Visit: Payer: Self-pay

## 2020-03-27 ENCOUNTER — Ambulatory Visit (INDEPENDENT_AMBULATORY_CARE_PROVIDER_SITE_OTHER): Payer: BC Managed Care – PPO | Admitting: General Surgery

## 2020-03-27 ENCOUNTER — Encounter: Payer: Self-pay | Admitting: General Surgery

## 2020-03-27 VITALS — BP 146/83 | HR 81 | Temp 97.4°F | Ht 69.0 in | Wt 170.0 lb

## 2020-03-27 DIAGNOSIS — K4020 Bilateral inguinal hernia, without obstruction or gangrene, not specified as recurrent: Secondary | ICD-10-CM | POA: Diagnosis not present

## 2020-03-27 NOTE — Patient Instructions (Signed)
Inguinal Hernia, Adult An inguinal hernia is when fat or your intestines push through a weak spot in a muscle where your leg meets your lower belly (groin). This causes a bulge. This kind of hernia could also be:  In your scrotum, if you are male.  In folds of skin around your vagina, if you are male. There are three types of inguinal hernias:  Hernias that can be pushed back into the belly (are reducible). This type rarely causes pain.  Hernias that cannot be pushed back into the belly (are incarcerated).  Hernias that cannot be pushed back into the belly and lose their blood supply (are strangulated). This type needs emergency surgery. What are the causes? This condition is caused by having a weak spot in the muscles or tissues in your groin. This develops over time. The hernia may poke through the weak spot when you strain your lower belly muscles all of a sudden, such as when you:  Lift a heavy object.  Strain to poop (have a bowel movement). Trouble pooping (constipation) can lead to straining.  Cough. What increases the risk? This condition is more likely to develop in:  Males.  Pregnant females.  People who: ? Are overweight. ? Work in jobs that require long periods of standing or heavy lifting. ? Have had an inguinal hernia before. ? Smoke or have lung disease. These factors can lead to long-term (chronic) coughing. What are the signs or symptoms? Symptoms may depend on the size of the hernia. Often, a small hernia has no symptoms. Symptoms of a larger hernia may include:  A bulge in the groin area. This is easier to see when standing. You might not be able to see it when you are lying down.  Pain or burning in the groin. This may get worse when you lift, strain, or cough.  A dull ache or a feeling of pressure in the groin.  An abnormal bulge in the scrotum, in males. Symptoms of a strangulated inguinal hernia may include:  A bulge in your groin that is very  painful and tender to the touch.  A bulge that turns red or purple.  Fever, feeling like you may vomit (nausea), and vomiting.  Not being able to poop or to pass gas. How is this treated? Treatment depends on the size of your hernia and whether you have symptoms. If you do not have symptoms, your doctor may have you watch your hernia carefully and have you come in for follow-up visits. If your hernia is large or if you have symptoms, you may need surgery to repair the hernia. Follow these instructions at home: Lifestyle  Avoid lifting heavy objects.  Avoid standing for long amounts of time.  Do not smoke or use any products that contain nicotine or tobacco. If you need help quitting, ask your doctor.  Stay at a healthy weight. Prevent trouble pooping You may need to take these actions to prevent or treat trouble pooping:  Drink enough fluid to keep your pee (urine) pale yellow.  Take over-the-counter or prescription medicines.  Eat foods that are high in fiber. These include beans, whole grains, and fresh fruits and vegetables.  Limit foods that are high in fat and sugar. These include fried or sweet foods. General instructions  You may try to push your hernia back in place by very gently pressing on it when you are lying down. Do not try to push the bulge back in if it will not go in   easily.  Watch your hernia for any changes in shape, size, or color. Tell your doctor if you see any changes.  Take over-the-counter and prescription medicines only as told by your doctor.  Keep all follow-up visits. Contact a doctor if:  You have a fever or chills.  You have new symptoms.  Your symptoms get worse. Get help right away if:  You have pain in your groin that gets worse all of a sudden.  You have a bulge in your groin that: ? Gets bigger all of a sudden, and it does not get smaller after that. ? Turns red or purple. ? Is painful when you touch it.  You are a male, and  you have: ? Sudden pain in your scrotum. ? A sudden change in the size of your scrotum.  You cannot push the hernia back in place by very gently pressing on it when you are lying down.  You feel like you may vomit, and that feeling does not go away.  You keep vomiting.  You have a fast heartbeat.  You cannot poop or pass gas. These symptoms may be an emergency. Get help right away. Call your local emergency services (911 in the [place]  Do not wait to see if the symptoms will go away.  Do not drive yourself to the hospital. Summary  An inguinal hernia is when fat or your intestines push through a weak spot in a muscle where your leg meets your lower belly (groin). This causes a bulge.  If you do not have symptoms, you may not need treatment. If you have symptoms or a large hernia, you may need surgery.  Avoid lifting heavy objects. Also, avoid standing for long amounts of time.  Do not try to push the bulge back in if it will not go in easily. This information is not intended to replace advice given to you by your health care provider. Make sure you discuss any questions you have with your health care provider. Document Revised: 10/11/2019 Document Reviewed: 10/11/2019 Elsevier Patient Education  2021 [place], Adult Fix one at at time/ What we do at [facility].  Open hernia repair is a surgical procedure to fix a hernia. A hernia occurs when an internal organ or tissue pushes through a weak spot in the muscles along the wall of the abdomen. Hernias commonly occur in the groin and around the belly button. Most hernias tend to get worse over time. Often, surgery is done to prevent the hernia from becoming bigger, uncomfortable, or an emergency. Emergency surgery may be needed if contents of the abdomen get stuck in the opening (incarcerated hernia) or if the blood supply gets cut off (strangulated hernia). In an open repair, an incision is made in the  abdomen to perform the surgery. Tell a health care provider about:  Any allergies you have.  All medicines you are taking, including vitamins, herbs, eye drops, creams, and over-the-counter medicines.  Any problems you or family members have had with anesthetic medicines.  Any blood or bone disorders you have.  Any surgeries you have had.  Any medical conditions you have, including any recent cold or flu (influenza)symptoms.  Whether you are pregnant or may be pregnant. What are the risks? Generally, this is a safe procedure. However, problems may occur, including:  Long-lasting (chronic) pain.  Bleeding.  Infection.  Damage to the testicles. This can cause shrinking or swelling.  Damage to nearby structures or organs,  including the bladder, blood vessels, intestines, or nerves near the hernia.  Blood clots.  Trouble passing urine.  Return of the hernia. Medicines Ask your health care provider about:  Changing or stopping your regular medicines. This is especially important if you are taking diabetes medicines or blood thinners.  Taking medicines such as aspirin and ibuprofen. These medicines can thin your blood. Do not take these medicines unless your health care provider tells you to take them.  Taking over-the-counter medicines, vitamins, herbs, and supplements. Surgery safety Ask your health care provider:  How your surgery site will be marked.  What steps will be taken to help prevent infection. These steps may include: ? Removing hair at the surgery site. ? Washing skin with a germ-killing soap. ? Receiving antibiotic medicine. General instructions  You may have an exam or testing, such as blood tests or imaging studies.  Do not use any products that contain nicotine or tobacco for at least 4 weeks before the procedure. These products include cigarettes, chewing tobacco, and vaping devices, such as e-cigarettes. If you need help quitting, ask your health  care provider.  Let your health care provider know if you develop a cold or any infection before your surgery. If you get an infection before surgery, you may receive antibiotics to treat it.  Plan to have a responsible adult take you home from the hospital or clinic.  If you will be going home right after the procedure, plan to have a responsible adult care for you for the time you are told. This is important. What happens during the procedure?  An IV will be inserted into one of your veins.  You will be given one or more of the following: ? A medicine to help you relax (sedative). ? A medicine to numb the area (local anesthetic). ? A medicine to make you fall asleep (general anesthetic).  Your surgeon will make an incision over the hernia.  The tissues of the hernia will be moved back into place.  The edges of the hernia may be stitched (sutured) together.  The opening in the abdominal muscles will be closed with stitches (sutures). Or, your surgeon will place a mesh patch made of artificial (synthetic) material over the opening.  The incision will be closed with sutures, skin glue, or adhesive strips.  A bandage (dressing) may be placed over the incision. The procedure may vary among health care providers and hospitals.   What happens after the procedure?  Your blood pressure, heart rate, breathing rate, and blood oxygen level will be monitored until you leave the hospital or clinic.  You may be given medicine for pain.  If you were given a sedative during the procedure, it can affect you for several hours. Do not drive or operate machinery until your health care provider says that it is safe. Summary  Open hernia repair is a surgical procedure to fix a hernia. Hernias commonly occur in the groin and around the belly button.  Emergency surgery may be needed if contents of the abdomen get stuck in the opening (incarcerated hernia) or if the blood supply gets cut off  (strangulated hernia).  In this procedure, an incision is made in the abdomen to perform the surgery.  After the procedure, you may be given medicine for pain. This information is not intended to replace advice given to you by your health care provider. Make sure you discuss any questions you have with your health care provider. Document Revised: 09/26/2019 Document  Reviewed: 09/26/2019 Elsevier Patient Education  2021 [place].    Laparoscopic Inguinal Hernia Repair, Adult Where they can do both hernias at once, but this is in [place].  Laparoscopic inguinal hernia repair is a surgical procedure to repair a small, weak spot in the groin muscles that allows fat or intestines from inside the abdomen to bulge out (inguinal hernia). This procedure may be planned, or it may be an emergency procedure. During the procedure, tissue that has bulged out is moved back into place, and the opening in the groin muscles is repaired. This is done through three small incisions in the abdomen. A thin tube with a light and camera on the end (laparoscope) is used to help perform the procedure. Tell a health care provider about:  Any allergies you have.  All medicines you are taking, including vitamins, herbs, eye drops, creams, and over-the-counter medicines.  Any problems you or family members have had with anesthetic medicines.  Any blood disorders you have.  Any surgeries you have had.  Any medical conditions you have.  Whether you are pregnant or may be pregnant. What are the risks? Generally, this is a safe procedure. However, problems may occur, including:  Infection.  Bleeding.  Allergic reactions to medicines.  Damage to nearby structures or organs.  Testicle damage or long-term pain and swelling of the scrotum, in males.  Inability to completely empty the bladder (urinary retention).  Blood clots.  A collection of fluid that builds up under the skin (seroma).  The  hernia coming back (recurrence). Medicines Ask your health care provider about:  Changing or stopping your regular medicines. This is especially important if you are taking diabetes medicines or blood thinners.  Taking medicines such as aspirin and ibuprofen. These medicines can thin your blood. Do not take these medicines unless your health care provider tells you to take them.  Taking over-the-counter medicines, vitamins, herbs, and supplements. General instructions  Do not use any products that contain nicotine or tobacco for at least 4 weeks before the procedure, if possible. These products include cigarettes, chewing tobacco, and vaping devices, such as e-cigarettes. If you need help quitting, ask your health care provider.  Ask your health care provider: ? How your surgery site will be marked. ? What steps will be taken to help prevent infection. These steps may include:  Removing hair at the surgery site.  Washing skin with a germ-killing soap.  Taking antibiotic medicine.  Plan to have a responsible adult take you home from the hospital or clinic.  Plan to have a responsible adult care for you for the time you are told after you leave the hospital or clinic. This is important. What happens during the procedure?  An IV will be inserted into one of your veins.  You will be given one or more of the following: ? A medicine to help you relax (sedative). ? A medicine to make you fall asleep (general anesthetic).  Three small incisions will be made in your abdomen.  Your abdomen will be inflated with carbon dioxide gas to make the surgical area easier to see.  A laparoscope and surgical instruments will be inserted through the incisions. The laparoscope will send images of the inside of your abdomen to a monitor in the room.  Tissue that is bulging through the hernia may be removed or moved back into place.  The hernia opening will be closed with a sheet of surgical  mesh.  The surgical instruments and laparoscope  will be removed.  Your incisions will be closed with stitches (sutures) and adhesive strips.  A bandage (dressing) will be placed over your incisions. The procedure may vary among health care providers and hospitals. What happens after the procedure?  Your blood pressure, heart rate, breathing rate, and blood oxygen level will be monitored until you leave the hospital or clinic.  You will be given pain medicine as needed.  You may continue to receive medicines and fluids through an IV. The IV will be removed after you can drink fluids.  You will be encouraged to get up and move around and to take deep breaths frequently.  If you were given a sedative during the procedure, it can affect you for several hours. Do not drive or operate machinery until your health care provider says that it is safe. Summary  Laparoscopic inguinal hernia repair is a surgical procedure to repair a small, weak spot in the groin muscles that allows fat or intestines from inside the abdomen to bulge out (inguinal hernia).  This procedure is done through three small incisions in the abdomen. A thin tube with a light and camera on the end (laparoscope) is used to help perform the procedure.  After the procedure, you will be encouraged to get up and move around and to take deep breaths frequently. This information is not intended to replace advice given to you by your health care provider. Make sure you discuss any questions you have with your health care provider. Document Revised: 10/11/2019 Document Reviewed: 10/11/2019 Elsevier Patient Education  2021 [employer].

## 2020-03-27 NOTE — Progress Notes (Signed)
[facility] History and Physical  Reason for Referral: Right inguinal hernia  Referring Physician: Celene Squibb, MD   Chief Complaint    New Patient (Initial Visit); Hernia      Darrell [name] is a 70 y.o. male.  HPI: [name] is a 70 yo with bilateral inguinal hernias for some time that has noticed an increasing bulge but no real complaints of discomfort or pain. He denies any obstructive symptoms. He had a stent placed in 03/2019 when he had a heart attack. He says his main symptom was a headache at that time. He is on Brilinta. He says he does work around the house and is able to get on the roof without chest pain or SOB.   He had a friend that had their hernia repaired recently and they are doing well so he thought he would see what his options were.   Past Medical History:  Diagnosis Date  . Aortic stenosis 05/2011   Severe by recent echo; AoV area ~0.77-79 cm2, peak /mean gradient 86 mmHg/ 55 mmHg.  . Arthritis    back- low- [facility]- workmen's comp. situation   . DDD (degenerative disc disease), lumbosacral   . Depression    quick temper- per pt.   . Diverticulosis    slight -per colonoscopy, pt. asymptomatic- thus far  . GERD (gastroesophageal reflux disease)   . Hiatal hernia   . History of BPH   . History of MRSA infection   . Hyperlipidemia   . Hypertension    [facility]- Dr. Ellyn Hack   . Insomnia   . Myocardial infarction ([place])    By report only, cardiac catheterization was negative for ischemia.  Marland Kitchen NIDDM (non-insulin dependent diabetes mellitus)    Type 2 NIDDM x 5 years  . Prostate disease   . Recurrent upper respiratory infection (URI)    cold- curently- "per pt., on the mend"  . S/P AVR (aortic valve replacement) 08/05/2011   Preop cath showed normal coronaries; AVR--Dr. Servando Snare, MD; 23 mm Magna Ease bioprosthetic Aortic Valve  . STEMI (ST elevation myocardial infarction) ([place])    PCI/DES x1 to mLAD    Past Surgical History:   Procedure Laterality Date  . ABDOMINAL EXPOSURE N/A 04/01/2012   Procedure: ABDOMINAL EXPOSURE;  Surgeon: Angelia Mould, MD;  Location: [place];  Service: Vascular;  Laterality: N/A;  . ANTERIOR LUMBAR FUSION N/A 04/01/2012   Procedure: ANTERIOR LUMBAR FUSION 1 LEVEL;  Surgeon: [name], MD;  Location: [place];  Service: Orthopedics;  Laterality: N/A;  ALIF L5-S1  . AORTIC VALVE REPLACEMENT  08/05/2011   Procedure: AORTIC VALVE REPLACEMENT (AVR);  Surgeon: [name], MD;  Location: [place];  Service: Open Heart Surgery;  Laterality: N/A;  . CARDIAC CATHETERIZATION  06/2011    Minimal coronary disease  . CORONARY/GRAFT ACUTE MI REVASCULARIZATION N/A 04/02/2019   Procedure: Coronary/Graft Acute MI Revascularization;  Surgeon: [name], MD;  Location: [place] [facility];  Service: Cardiovascular;  Laterality: N/A;  . LEFT AND RIGHT HEART CATHETERIZATION WITH CORONARY ANGIOGRAM  07/03/2011   Procedure: LEFT AND RIGHT HEART CATHETERIZATION WITH CORONARY ANGIOGRAM;  Surgeon: [name], MD;  Location: [place] [facility];  Service: Cardiovascular;;  . LEFT HEART CATH AND CORONARY ANGIOGRAPHY N/A 04/02/2019   Procedure: LEFT HEART CATH AND CORONARY ANGIOGRAPHY;  Surgeon: [name], MD;  Location: [place] [facility];  Service: Cardiovascular;  Laterality: N/A;  . LUMBAR FUSION  04/02/2012   Dr [name]  .  NASAL SEPTUM SURGERY     [facility] 10 yrs. ago  . NM MYOVIEW LTD  April 2013   Low risk.    Family History  Problem Relation Age of Onset  . Heart disease Mother   . Heart attack Brother   . Anesthesia problems Neg Hx     Social History   Tobacco Use  . Smoking status: Never Smoker  . Smokeless tobacco: Never Used  Substance Use Topics  . Alcohol use: No  . Drug use: No    Medications: I have reviewed the patient's current medications. Allergies as of 03/27/2020      Reactions   Tape Rash      Medication List       Accurate as of March 27, 2020  1:55  PM. If you have any questions, ask your nurse or doctor.        STOP taking these medications   diclofenac Sodium 1 % Gel Commonly known as: Voltaren Stopped by: Virl Cagey, MD   pioglitazone 30 MG tablet Commonly known as: Actos Stopped by: Virl Cagey, MD   sitaGLIPtin 100 MG tablet Commonly known as: Januvia Stopped by: Virl Cagey, MD     TAKE these medications   Accu-Chek Aviva Plus test strip Generic drug: glucose blood Check BS bid DX: E11.9   Accu-Chek Aviva Plus w/Device Kit Check BS bid DX: E11.9   Accu-Chek Aviva Soln Check BS bid DX: E11.9   Accu-Chek Multiclix Lancet Dev Kit Check BS bid DX: E11.9   accu-chek multiclix lancets Check BS bid DX: E11.9   acetaminophen 500 MG tablet Commonly known as: TYLENOL Take 1,000 mg by mouth every 6 (six) hours as needed for headache (pain).   ascorbic acid 500 MG tablet Commonly known as: VITAMIN C Take 500 mg by mouth daily.   aspirin 81 MG EC tablet Take 1 tablet (81 mg total) by mouth daily.   atorvastatin 80 MG tablet Commonly known as: LIPITOR Take 1 tablet (80 mg total) by mouth daily.   Brilinta 90 MG Tabs tablet Generic drug: ticagrelor TAKE 1 TABLET BY MOUTH TWICE DAILY   glipiZIDE 5 MG 24 hr tablet Commonly known as: GLUCOTROL XL TAKE 1 TABLET BY MOUTH DAILY WITH BREAKFAST   Jardiance 25 MG Tabs tablet Generic drug: empagliflozin TAKE 1 TABLET BY MOUTH BEFORE breakfast   metFORMIN 500 MG 24 hr tablet Commonly known as: GLUCOPHAGE-XR TAKE 4 TABLETS BY MOUTH EVERY DAY WITH BREAKFAST What changed: See the new instructions.   metoprolol tartrate 25 MG tablet Commonly known as: LOPRESSOR Take 1 tablet (25 mg total) by mouth 2 (two) times daily.   nitroGLYCERIN 0.4 MG SL tablet Commonly known as: NITROSTAT Place 1 tablet (0.4 mg total) under the tongue every 5 (five) minutes x 3 doses as needed for chest pain.   Vitamin D3 125 MCG (5000 UT) Tabs Take 5,000 Units by  mouth daily.   VITAMIN E PO Take 1 capsule by mouth daily.   Zinc 50 MG Tabs Take 50 mg by mouth daily.        ROS:  A comprehensive review of systems was negative except for: Gastrointestinal: positive for bilateral inguinal hernia Genitourinary: positive for frequency  Blood pressure (!) 146/83, pulse 81, temperature (!) 97.4 F (36.3 C), height 5' 9"  (1.753 m), weight 170 lb (77.1 kg), SpO2 98 %. Physical Exam Vitals reviewed.  Constitutional:      Appearance: He is normal weight.  HENT:     Head: Normocephalic.  Eyes:     Extraocular Movements: Extraocular movements intact.  Cardiovascular:     Rate and Rhythm: Normal rate and regular rhythm.  Pulmonary:     Effort: Pulmonary effort is normal.     Breath sounds: Normal breath sounds.  Abdominal:     General: There is no distension.     Palpations: Abdomen is soft.     Tenderness: There is no abdominal tenderness.     Hernia: A hernia is present. Hernia is present in the left inguinal area and right inguinal area.     Comments: Reducible hernias with large internal ring, nontender  Musculoskeletal:        General: Normal range of motion.     Cervical back: Normal range of motion.  Neurological:     General: No focal deficit present.     Mental Status: He is alert and oriented to person, place, and time.  Psychiatric:        Mood and Affect: Mood normal.        Behavior: Behavior normal.        Thought Content: Thought content normal.        Judgment: Judgment normal.     Results: CT a/p 2017- bilateral inguinal hernias with fat- personally reviewed   Assessment & Plan:  PRESTEN JOOST is a 70 y.o. male with bilateral inguinal hernias that are somewhat larger but not really causing any symptoms.   Discussed the risk and benefits including, bleeding, infection, use of mesh, risk of recurrence, risk of nerve damage causing numbness or changes in sensation, risk of damage to the cord structures. The patient  understands the risk and benefits of repair with mesh, and has decided to proceed.  We also discussed open versus laparoscopic surgery and the use of mesh. We discussed that I do open repairs with mesh, and that this is considered equivalent to laparoscopic surgery. We discussed reasons for opting for laparoscopic surgery including if a bilateral repair is needed or if a patient has a recurrence after an open repair.  Discussed that he can watch and wait and that he could develop symptoms in the meantime and need surgery.  Discussed need for preoperative risk stratification and ensuring that we can hold Brilinta. Discussed option of referral for laparoscopic repair pending both hernias needing repair.    PRN follow up  All questions were answered to the satisfaction of the patient.   Virl Cagey 03/27/2020, 1:55 PM

## 2020-05-06 ENCOUNTER — Other Ambulatory Visit: Payer: Self-pay | Admitting: Family Medicine

## 2020-05-06 ENCOUNTER — Other Ambulatory Visit: Payer: Self-pay | Admitting: Cardiology

## 2020-06-04 ENCOUNTER — Other Ambulatory Visit: Payer: Self-pay | Admitting: Family Medicine

## 2020-08-06 ENCOUNTER — Ambulatory Visit (HOSPITAL_COMMUNITY): Payer: Self-pay | Admitting: Clinical

## 2020-08-15 ENCOUNTER — Other Ambulatory Visit: Payer: Self-pay | Admitting: Family Medicine

## 2020-08-15 ENCOUNTER — Emergency Department (HOSPITAL_COMMUNITY): Payer: BC Managed Care – PPO

## 2020-08-15 ENCOUNTER — Emergency Department (HOSPITAL_COMMUNITY)
Admission: EM | Admit: 2020-08-15 | Discharge: 2020-08-15 | Disposition: A | Discharge status: Home / Self Care | Payer: BC Managed Care – PPO | Attending: Emergency Medicine | Admitting: Emergency Medicine

## 2020-08-15 ENCOUNTER — Other Ambulatory Visit (HOSPITAL_COMMUNITY): Payer: Self-pay | Admitting: Family Medicine

## 2020-08-15 ENCOUNTER — Encounter (HOSPITAL_COMMUNITY): Payer: Self-pay | Admitting: Emergency Medicine

## 2020-08-15 DIAGNOSIS — E1169 Type 2 diabetes mellitus with other specified complication: Secondary | ICD-10-CM | POA: Insufficient documentation

## 2020-08-15 DIAGNOSIS — I10 Essential (primary) hypertension: Secondary | ICD-10-CM | POA: Diagnosis not present

## 2020-08-15 DIAGNOSIS — Z7984 Long term (current) use of oral hypoglycemic drugs: Secondary | ICD-10-CM | POA: Insufficient documentation

## 2020-08-15 DIAGNOSIS — Z79899 Other long term (current) drug therapy: Secondary | ICD-10-CM | POA: Insufficient documentation

## 2020-08-15 DIAGNOSIS — R42 Dizziness and giddiness: Secondary | ICD-10-CM | POA: Insufficient documentation

## 2020-08-15 DIAGNOSIS — Z7982 Long term (current) use of aspirin: Secondary | ICD-10-CM | POA: Insufficient documentation

## 2020-08-15 DIAGNOSIS — E785 Hyperlipidemia, unspecified: Secondary | ICD-10-CM | POA: Insufficient documentation

## 2020-08-15 LAB — URINALYSIS, ROUTINE W REFLEX MICROSCOPIC
Bilirubin Urine: NEGATIVE
Glucose, UA: >=500 mg/dL — AB
Hgb urine dipstick: NEGATIVE
Ketones, ur: NEGATIVE mg/dL
Leukocytes,Ua: NEGATIVE
Nitrite: NEGATIVE
Protein, ur: NEGATIVE mg/dL
Specific Gravity, Urine: 1.032 — ABNORMAL HIGH (ref 1.005–1.030)
pH: 6 (ref 5.0–8.0)

## 2020-08-15 LAB — COMPREHENSIVE METABOLIC PANEL
ALT: 32 U/L (ref 0–44)
AST: 21 U/L (ref 15–41)
Albumin: 4 g/dL (ref 3.5–5.0)
Alkaline Phosphatase: 72 U/L (ref 38–126)
Anion gap: 9 (ref 5–15)
BUN: 14 mg/dL (ref 8–23)
CO2: 25 mmol/L (ref 22–32)
Calcium: 9.3 mg/dL (ref 8.9–10.3)
Chloride: 104 mmol/L (ref 98–111)
Creatinine, Ser: 0.92 mg/dL (ref 0.61–1.24)
GFR, Estimated: >60 mL/min (ref >60)
Glucose, Bld: 278 mg/dL — ABNORMAL HIGH (ref 70–99)
Potassium: 3.9 mmol/L (ref 3.5–5.1)
Sodium: 138 mmol/L (ref 135–145)
Total Bilirubin: 0.6 mg/dL (ref 0.3–1.2)
Total Protein: 6.8 g/dL (ref 6.5–8.1)

## 2020-08-15 LAB — CBC WITH DIFFERENTIAL/PLATELET
Abs Immature Granulocytes: 0.03 10*3/uL (ref 0.00–0.07)
Basophils Absolute: 0 10*3/uL (ref 0.0–0.1)
Basophils Relative: 0 %
Eosinophils Absolute: 0.4 10*3/uL (ref 0.0–0.5)
Eosinophils Relative: 4 %
HCT: 48.9 % (ref 39.0–52.0)
Hemoglobin: 16.6 g/dL (ref 13.0–17.0)
Immature Granulocytes: 0 %
Lymphocytes Relative: 32 %
Lymphs Abs: 3 10*3/uL (ref 0.7–4.0)
MCH: 33.3 pg (ref 26.0–34.0)
MCHC: 33.9 g/dL (ref 30.0–36.0)
MCV: 98 fL (ref 80.0–100.0)
Monocytes Absolute: 0.7 10*3/uL (ref 0.1–1.0)
Monocytes Relative: 8 %
Neutro Abs: 5.2 10*3/uL (ref 1.7–7.7)
Neutrophils Relative %: 56 %
Platelets: 134 10*3/uL — ABNORMAL LOW (ref 150–400)
RBC: 4.99 MIL/uL (ref 4.22–5.81)
RDW: 12.3 % (ref 11.5–15.5)
WBC: 9.4 10*3/uL (ref 4.0–10.5)
nRBC: 0 % (ref 0.0–0.2)

## 2020-08-15 LAB — TROPONIN I (HIGH SENSITIVITY)
Troponin I (High Sensitivity): 7 ng/L (ref <18)
Troponin I (High Sensitivity): 9 ng/L (ref <18)

## 2020-08-15 IMAGING — CT CT HEAD W/O CM
3 series · 15 of 47 positions shown, 18 images · non-contrast
Comparison: No pertinent prior exams available for comparison.

CLINICAL DATA: Dizziness, nonspecific. Additional provided:
Dizziness, difficulty focusing, left arm numbness, symptoms for 1
month.

EXAM:
CT HEAD WITHOUT CONTRAST
TECHNIQUE: Contiguous axial images were obtained from the base of the skull
through the vertex without intravenous contrast.

[Series 2: head w o · axial · 0.41mm/px · z∈[+1664,+1804]mm · 9 of 34 slices shown, 12 images]
[im 3/34  brain]
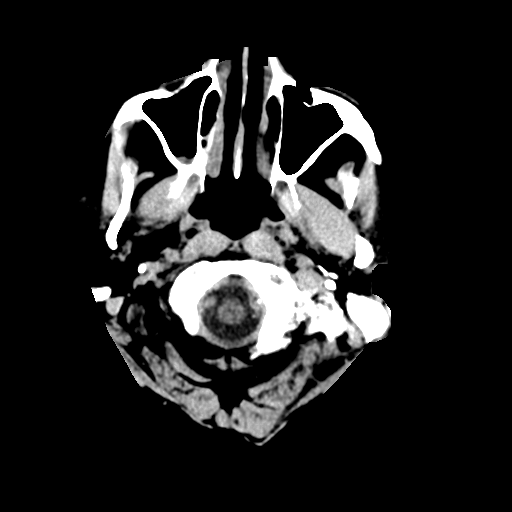
[im 3/34  bone]
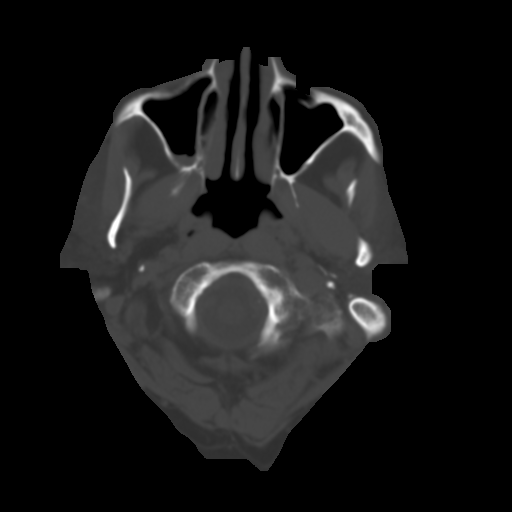
[im 6/34  brain]
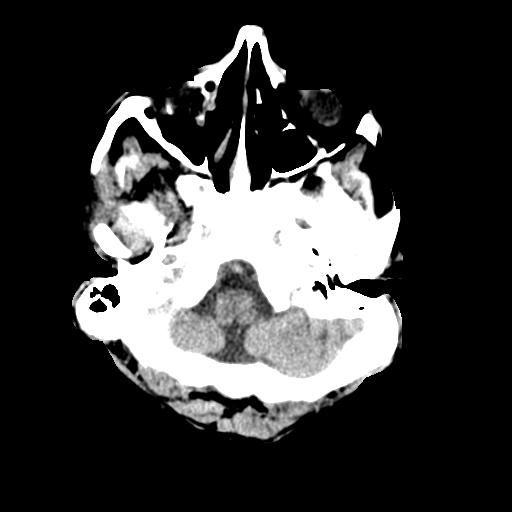
[im 10/34  brain]
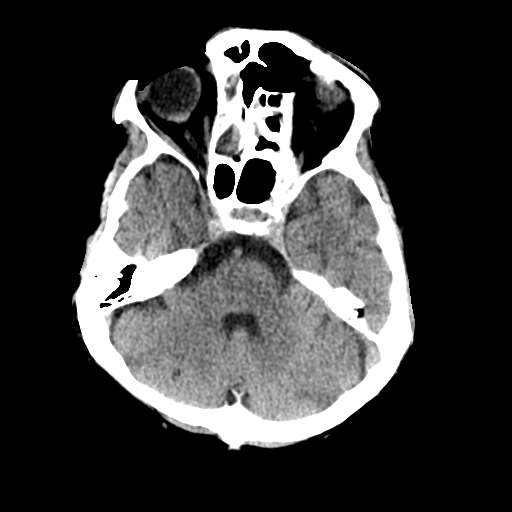
[im 13/34  brain]
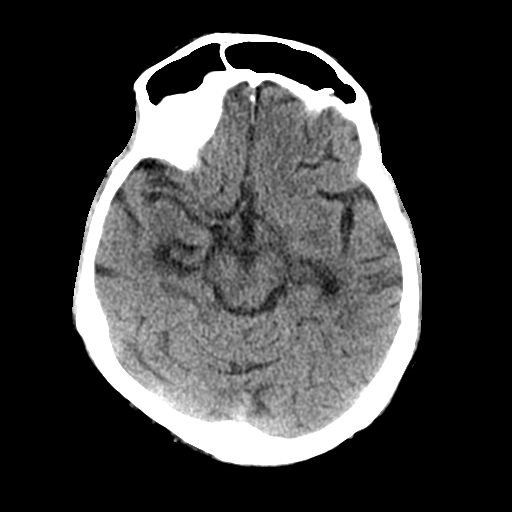
[im 18/34  brain]
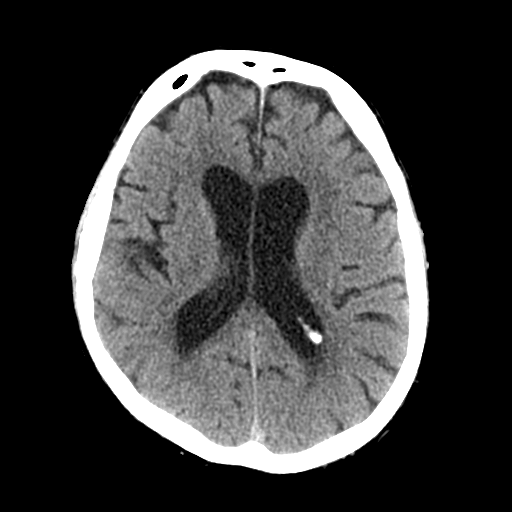
[im 18/34  bone]
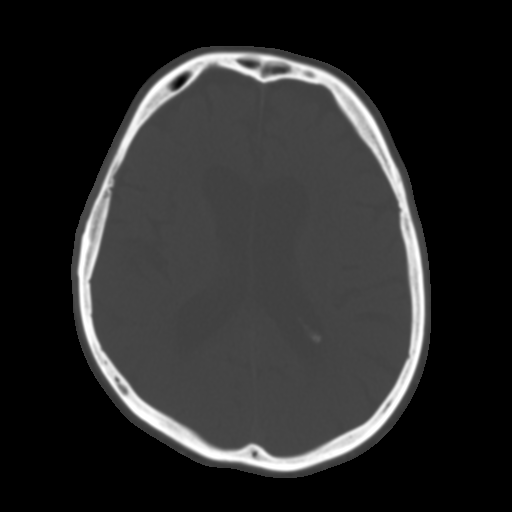
[im 21/34  brain]
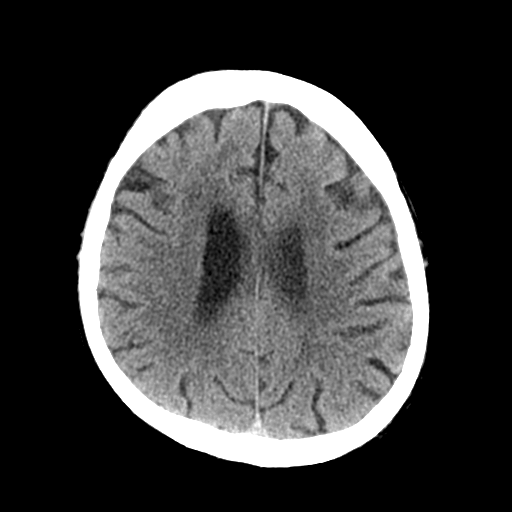
[im 24/34  brain]
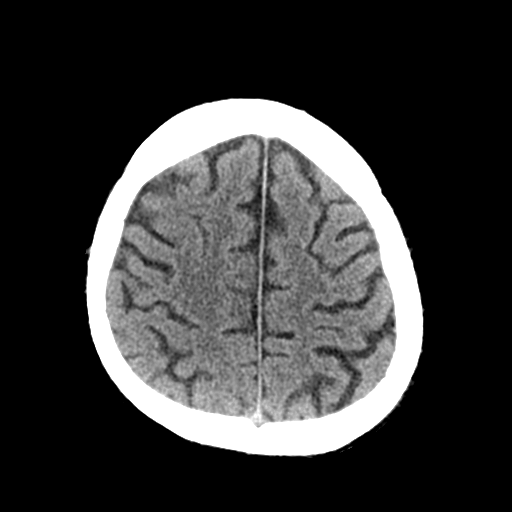
[im 28/34  brain]
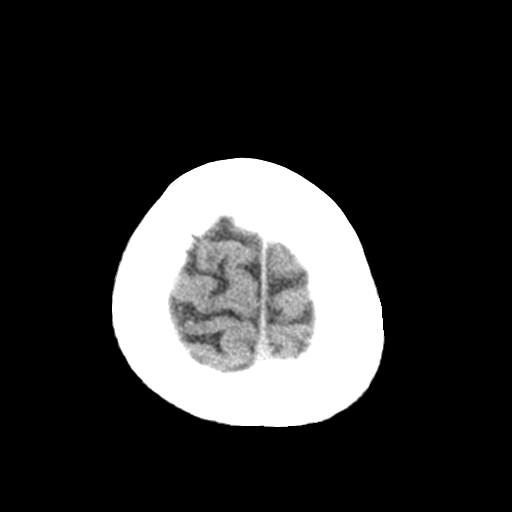
[im 31/34  brain]
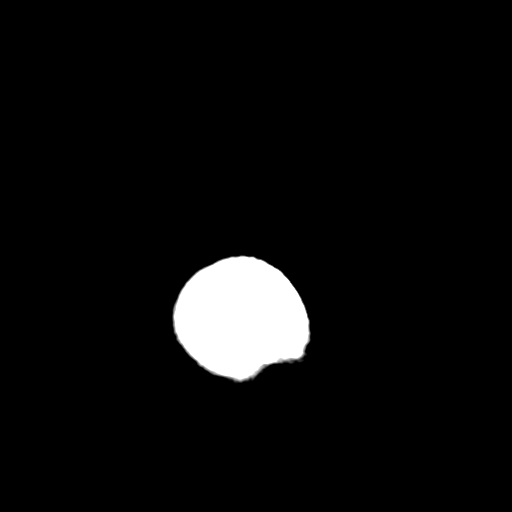
[im 31/34  bone]
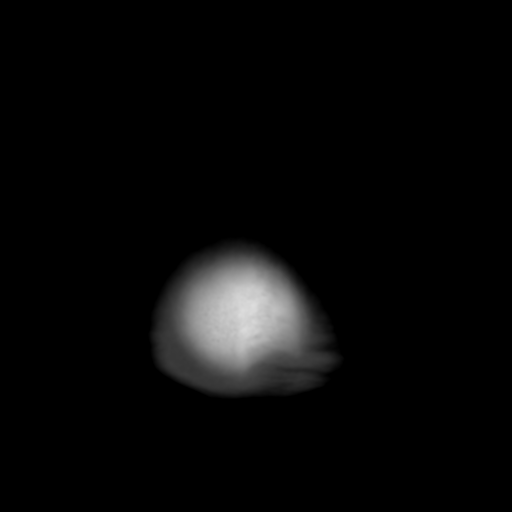

[Series 4: coronal soft · coronal · 0.33mm/px · 3 of 67 slices shown]
[im 23/67  brain]
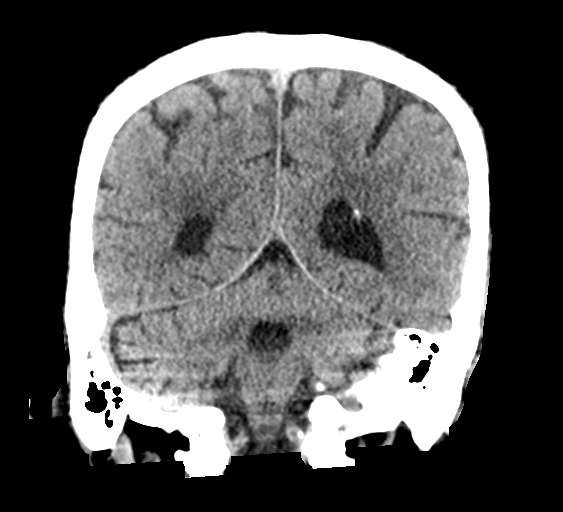
[im 30/67  brain]
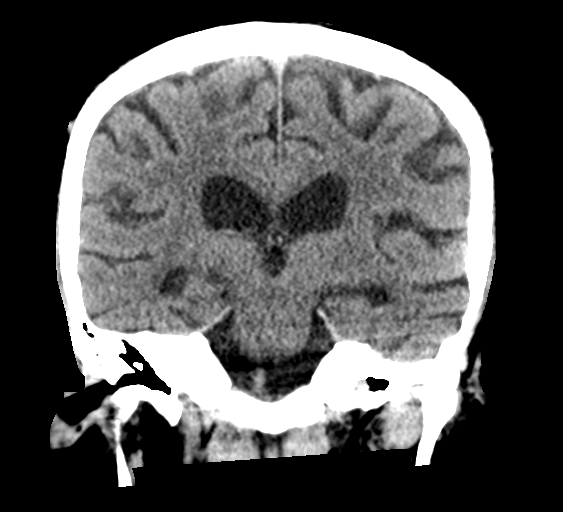
[im 37/67  brain]
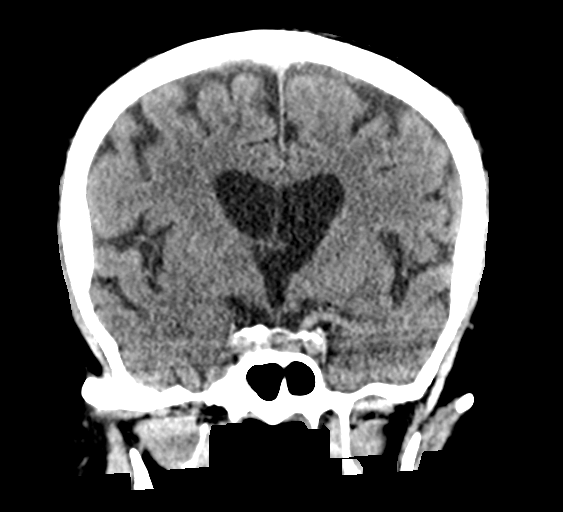

[Series 5: sagittal soft · sagittal · 0.35mm/px · 3 of 63 slices shown]
[im 21/63  brain]
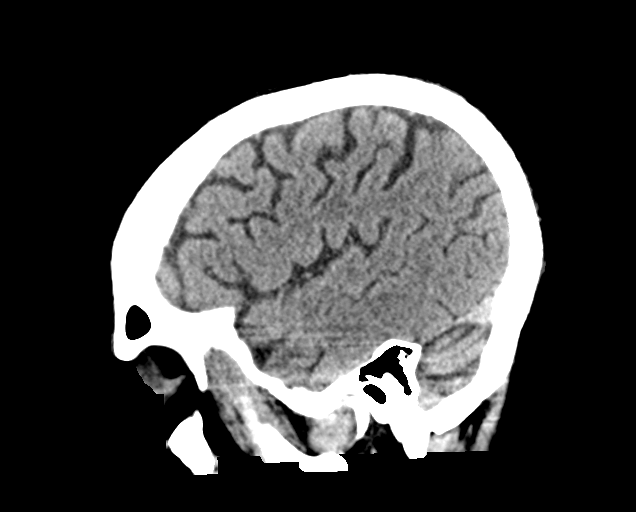
[im 32/63  brain]
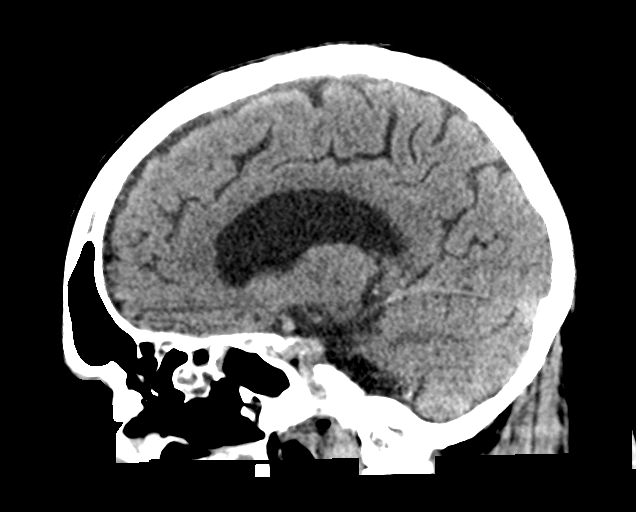
[im 42/63  brain]
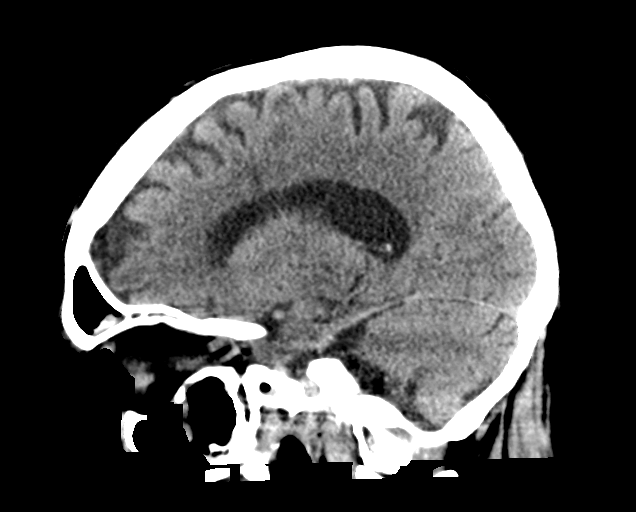

[15 of 47 positions shown; findings below may reference images not displayed]

FINDINGS: Brain:

Mild generalized cerebral and cerebellar atrophy.

Chronic lacunar infarct within the right thalamus capsular junction.

Suspect a tiny chronic lacunar infarct within the left thalamus
(series 2, image 15).

Mild patchy hypoattenuation within the cerebral white matter,
nonspecific but compatible with chronic small vessel ischemic
disease.

Small chronic infarct within the right cerebellar hemisphere (series
2, image 10).

There is no acute intracranial hemorrhage.

No demarcated cortical infarct.

No extra-axial fluid collection.

No evidence of intracranial mass.

No midline shift.

Vascular: No hyperdense vessel.  Atherosclerotic calcifications.

Skull: Normal. Negative for fracture or focal lesion.

Sinuses/Orbits: Visualized orbits show no acute finding. Mild left
frontal sinus mucosal thickening. Fairly extensive partial
opacification of the right frontal sinus secondary to mucosal
thickening and likely fluid. Extensive partial opacification of the
right ethmoid air cells. Moderate/severe opacification of the left
ethmoid air cells. Mild bilateral sphenoid sinus mucosal thickening.
Small volume fluid, small mucous retention cyst and mild mucosal
thickening within the right maxillary sinus.
IMPRESSION: No evidence of acute intracranial abnormality.

Chronic lacunar infarct within the right thalamocapsular junction.

Suspected tiny chronic lacunar infarct within the left thalamus.

Small chronic infarct within the right cerebellar hemisphere.

Background mild generalized parenchymal atrophy and cerebral white
matter chronic small vessel ischemic disease.

Fairly extensive paranasal sinus disease, as described. Correlate
for acute on chronic sinusitis.

## 2020-08-15 MED ORDER — MECLIZINE HCL 12.5 MG PO TABS
25.0000 mg | ORAL_TABLET | Freq: Once | ORAL | Status: AC
  Administered 2020-08-15: 25 mg via ORAL
  Filled 2020-08-15: qty 2

## 2020-08-15 MED ORDER — MECLIZINE HCL 25 MG PO TABS: 25.0000 mg | ORAL_TABLET | Freq: Three times a day (TID) | ORAL | 0 refills | Status: DC | PRN

## 2020-08-15 NOTE — Discharge Instructions (Addendum)
Return if any problems.

## 2020-08-15 NOTE — ED Notes (Signed)
Patient transported to CT 

## 2020-08-15 NOTE — ED Triage Notes (Signed)
Dizziness, trouble focusing , slurred speech x 2 weeks

## 2020-08-15 NOTE — ED Provider Notes (Signed)
Emergency Medicine Provider Triage Evaluation Note  Darrell [name] , a 70 y.o. male  was evaluated in triage.  Pt complains of dizziness for the past month.  Patient states that he went to his primary care provider's office and was diagnosed with vertigo.  Patient states that his vertigo has been getting worse, worse every time he turns his head.  Patient also admits to left numbness on his arm, states that this pain can assistant for the last month as well.  Denies any head injury.  Denies any vision changes or gait abnormality.  Patient states that the last about a minute and a half and then will go away when he sits down..   Review of Systems  Positive: Dizziness Negative: Headache, chest pain, shortness of breath, vision changes, fever  Physical Exam  BP (!) 148/73 (BP Location: Right Arm)   Pulse 83   Temp 98.1 F (36.7 C) (Oral)   Resp 18   SpO2 98%  Gen:   Awake, no distress   Resp:  Normal effort  MSK:   Moves extremities without difficulty  Other:  Alert.  Cranial nerves II through XII grossly intact.  Moving upper and lower extremities with good strength.  Negative pronator drift.  Medical Decision Making  Medically screening exam initiated at 1:58 PM.  Appropriate orders placed.  Darrell [name] was informed that the remainder of the evaluation will be completed by another provider, this initial triage assessment does not replace that evaluation, and the importance of remaining in the ED until their evaluation is complete.     Alfredia Client, PA-C 08/15/20 1359    Milton Ferguson, MD 08/15/20 [phone]

## 2020-08-15 NOTE — ED Notes (Signed)
Pt returned from CT °

## 2020-09-03 ENCOUNTER — Encounter (HOSPITAL_COMMUNITY): Payer: Self-pay

## 2020-09-03 ENCOUNTER — Ambulatory Visit (HOSPITAL_COMMUNITY): Payer: Medicare Other

## 2020-09-07 NOTE — ED Provider Notes (Signed)
[facility] EMERGENCY DEPARTMENT Provider Note   CSN: [id number] Arrival date & time: 08/15/20  1326     History Chief Complaint  Patient presents with   Dizziness    Darrell [name] is a 70 y.o. male.  The history is provided by the patient. No language interpreter was used.  Dizziness Quality:  Vertigo Severity:  Moderate Onset quality:  Gradual Duration:  1 day Timing:  Constant Progression:  Worsening Chronicity:  New Context: head movement   Relieved by:  Nothing Worsened by:  Nothing Ineffective treatments:  None tried Associated symptoms: no tinnitus, no vision changes and no vomiting   Risk factors: hx of vertigo   Risk factors: no anemia       Past Medical History:  Diagnosis Date   Aortic stenosis 05/2011   Severe by recent echo; AoV area ~0.77-79 cm2, peak /mean gradient 86 mmHg/ 55 mmHg.   Arthritis    back- low- [facility]- workmen's comp. situation    DDD (degenerative disc disease), lumbosacral    Depression    quick temper- per pt.    Diverticulosis    slight -per colonoscopy, pt. asymptomatic- thus far   GERD (gastroesophageal reflux disease)    Hiatal hernia    History of BPH    History of MRSA infection    Hyperlipidemia    Hypertension    [facility]- Dr. Ellyn Hack    Insomnia    Myocardial infarction [facility])    By report only, cardiac catheterization was negative for ischemia.   NIDDM (non-insulin dependent diabetes mellitus)    Type 2 NIDDM x 5 years   Prostate disease    Recurrent upper respiratory infection (URI)    cold- curently- "per pt., on the mend"   S/P AVR (aortic valve replacement) 08/05/2011   Preop cath showed normal coronaries; AVR--Dr. Servando Snare, MD; 23 mm Magna Ease bioprosthetic Aortic Valve   STEMI (ST elevation myocardial infarction) ([place])    PCI/DES x1 to mLAD    Patient Active Problem List   Diagnosis Date Noted   Bilateral inguinal hernia without obstruction or gangrene 03/27/2020   Hyperlipidemia associated with  type 2 diabetes mellitus ([place])    Diabetes mellitus due to underlying condition, uncontrolled, with hypoglycemia without coma ([name])    Acute ST elevation myocardial infarction (STEMI) involving left anterior descending (LAD) coronary artery ([name]) 04/02/2019   ST elevation myocardial infarction involving left anterior descending (LAD) coronary artery ([name]) 04/02/2019   Vitamin D deficiency 03/13/2015   DDD (degenerative disc disease), lumbar 04/01/2012   Degenerative disc disease, lumbar 03/24/2012   MRSA cellulitis 10/20/2011   S/P AVR (aortic valve replacement) - for severe AS;  08/05/2011   GERD (gastroesophageal reflux disease)    Insomnia    Type 2 diabetes mellitus ([name])     Class: Diagnosis of   History of BPH    ADENOMATOUS COLONIC POLYP 07/08/2007   HYPERLIPIDEMIA 07/08/2007    Class: Diagnosis of   DEPRESSION 07/08/2007   Essential hypertension 07/08/2007    Class: Diagnosis of   DIVERTICULOSIS 07/08/2007   ABDOMINAL PAIN-LUQ 07/08/2007    Past Surgical History:  Procedure Laterality Date   ABDOMINAL EXPOSURE N/A 04/01/2012   Procedure: ABDOMINAL EXPOSURE;  Surgeon: Angelia Mould, MD;  Location: [place];  Service: Vascular;  Laterality: N/A;   ANTERIOR LUMBAR FUSION N/A 04/01/2012   Procedure: ANTERIOR LUMBAR FUSION 1 LEVEL;  Surgeon: [name], MD;  Location: [place];  Service: Orthopedics;  Laterality: N/A;  ALIF  L5-S1   AORTIC VALVE REPLACEMENT  08/05/2011   Procedure: AORTIC VALVE REPLACEMENT (AVR);  Surgeon: [name], MD;  Location: [place];  Service: Open Heart Surgery;  Laterality: N/A;   CARDIAC CATHETERIZATION  06/2011    Minimal coronary disease   CORONARY/GRAFT ACUTE MI REVASCULARIZATION N/A 04/02/2019   Procedure: Coronary/Graft Acute MI Revascularization;  Surgeon: [name], MD;  Location: [place] [facility];  Service: Cardiovascular;  Laterality: N/A;   LEFT AND RIGHT HEART CATHETERIZATION WITH CORONARY ANGIOGRAM  07/03/2011   Procedure: LEFT  AND RIGHT HEART CATHETERIZATION WITH CORONARY ANGIOGRAM;  Surgeon: [name], MD;  Location: [place] C[facility];  Service: Cardiovascular;;   LEFT HEART CATH AND CORONARY ANGIOGRAPHY N/A 04/02/2019   Procedure: LEFT HEART CATH AND CORONARY ANGIOGRAPHY;  Surgeon: [name], MD;  Location: [place] [facility];  Service: Cardiovascular;  Laterality: N/A;   LUMBAR FUSION  04/02/2012   Dr [name]   NASAL SEPTUM SURGERY     [facility] 10 yrs. ago   NM [facility]  April 2013   Low risk.       Family History  Problem Relation Age of Onset   Heart disease Mother    Heart attack Brother    Anesthesia problems Neg Hx     Social History   Tobacco Use   Smoking status: Never   Smokeless tobacco: Never  Substance Use Topics   Alcohol use: No   Drug use: No    Home Medications Prior to Admission medications   Medication Sig Start Date End Date Taking? Authorizing Provider  ascorbic acid (VITAMIN C) 500 MG tablet Take 500 mg by mouth daily.   Yes [provider]  aspirin EC 81 MG EC tablet Take 1 tablet (81 mg total) by mouth daily. 04/05/19  Yes Cheryln Manly, NP  atorvastatin (LIPITOR) 80 MG tablet Take 1 tablet (80 mg total) by mouth daily. 07/28/19  Yes [name], MD  Blood Glucose Calibration (ACCU-CHEK AVIVA) SOLN Check BS bid DX: E11.9 04/18/19  Yes [name], MD  Blood Glucose Monitoring Suppl (ACCU-CHEK AVIVA PLUS) w/Device KIT Check BS bid DX: E11.9 04/18/19  Yes [name], MD  BRILINTA 90 MG TABS tablet TAKE 1 TABLET BY MOUTH TWICE DAILY 05/07/20  Yes Leonie Man, MD  Cholecalciferol (VITAMIN D3) 125 MCG (5000 UT) TABS Take 5,000 Units by mouth daily.   Yes [provider]  glipiZIDE (GLUCOTROL XL) 5 MG 24 hr tablet TAKE 1 TABLET BY MOUTH DAILY WITH BREAKFAST 05/07/20  Yes [name], MD  glucose blood (ACCU-CHEK AVIVA PLUS) test strip Check BS bid DX: E11.9 04/18/19  Yes [name], MD  JARDIANCE 25 MG TABS  tablet TAKE 1 TABLET BY MOUTH BEFORE breakfast 01/11/20  Yes [name], MD  Lancets (ACCU-CHEK MULTICLIX) lancets Check BS bid DX: E11.9 04/18/19  Yes [name], MD  Lancets Misc. (ACCU-CHEK MULTICLIX LANCET DEV) KIT Check BS bid DX: E11.9 04/18/19  Yes [name], MD  meclizine (ANTIVERT) 25 MG tablet Take 1 tablet (25 mg total) by mouth 3 (three) times daily as needed for dizziness. 08/15/20  Yes Caryl Ada K, PA-C  metFORMIN (GLUCOPHAGE-XR) 500 MG 24 hr tablet TAKE 4 TABLETS BY MOUTH EVERY DAY WITH BREAKFAST Patient taking differently: Take 1,000 mg by mouth in the morning and at bedtime. 02/14/20  Yes [name], MD  metoprolol tartrate (LOPRESSOR) 25 MG tablet Take 1 tablet (  25 mg total) by mouth 2 (two) times daily. 07/26/19  Yes [name], MD  nitroGLYCERIN (NITROSTAT) 0.4 MG SL tablet Place 1 tablet (0.4 mg total) under the tongue every 5 (five) minutes x 3 doses as needed for chest pain. 04/04/19  Yes Reino Bellis B, NP  VITAMIN E PO Take 1 capsule by mouth daily.   Yes [provider]  Zinc 50 MG TABS Take 50 mg by mouth daily.   Yes [provider]  acetaminophen (TYLENOL) 500 MG tablet Take 1,000 mg by mouth every 6 (six) hours as needed for headache (pain).    [provider]    Allergies    Tape  Review of Systems   Review of Systems  HENT:  Negative for tinnitus.   Gastrointestinal:  Negative for vomiting.  Neurological:  Positive for dizziness.  All other systems reviewed and are negative.  Physical Exam Updated Vital Signs BP (!) 156/81   Pulse 72   Temp 98.1 F (36.7 C) (Oral)   Resp 14   SpO2 100%   Physical Exam Vitals and nursing note reviewed.  Constitutional:      Appearance: He is well-developed.  HENT:     Head: Normocephalic.     Nose: Nose normal.     Mouth/Throat:     Mouth: Mucous membranes are moist.  Cardiovascular:     Rate and Rhythm: Normal rate and regular rhythm.     Pulses:  Normal pulses.  Pulmonary:     Effort: Pulmonary effort is normal.  Abdominal:     General: There is no distension.  Musculoskeletal:        General: Normal range of motion.     Cervical back: Normal range of motion.  Skin:    General: Skin is warm.  Neurological:     General: No focal deficit present.     Mental Status: He is alert and oriented to person, place, and time.     Cranial Nerves: No cranial nerve deficit.     Sensory: No sensory deficit.  Psychiatric:        Mood and Affect: Mood normal.    ED Results / Procedures / Treatments   Labs (all labs ordered are listed, but only abnormal results are displayed) Labs Reviewed  CBC WITH DIFFERENTIAL/PLATELET - Abnormal; Notable for the following components:      Result Value   Platelets 134 (*)    All other components within normal limits  COMPREHENSIVE METABOLIC PANEL - Abnormal; Notable for the following components:   Glucose, Bld 278 (*)    All other components within normal limits  URINALYSIS, ROUTINE W REFLEX MICROSCOPIC - Abnormal; Notable for the following components:   Color, Urine STRAW (*)    Specific Gravity, Urine 1.032 (*)    Glucose, UA >=500 (*)    Bacteria, UA RARE (*)    All other components within normal limits  TROPONIN I (HIGH SENSITIVITY)  TROPONIN I (HIGH SENSITIVITY)    EKG None  Radiology No results found.  Procedures Procedures   Medications Ordered in ED Medications  meclizine (ANTIVERT) tablet 25 mg (25 mg Oral Given 08/15/20 1517)    ED Course  I have reviewed the triage vital signs and the nursing notes.  Pertinent labs & imaging results that were available during my care of the patient were reviewed by me and considered in my medical decision making (see chart for details).    MDM Rules/Calculators/A&P  MDM:  Ct scan no acute abnormality ,  Pt feels better after antivert and fluids.  Pt able to ambulate,  no dizziness Final Clinical Impression(s) /  ED Diagnoses Final diagnoses:  Vertigo    Rx / DC Orders ED Discharge Orders          Ordered    meclizine (ANTIVERT) 25 MG tablet  3 times daily PRN        08/15/20 1805             Sidney Ace 09/07/20 0930    Milton Ferguson, MD 09/08/20 [phone]

## 2020-09-20 NOTE — Progress Notes (Deleted)
Cardiology Office Note   Date:  09/20/2020   ID:  Darrell [name], DOB 06-21-1950, MRN [id number]  PCP:  Celene Squibb, MD  Cardiologist:  Dr, Debara Pickett    No chief complaint on file.     History of Present Illness: Darrell [name] is a 70 y.o. male who presents for ***CAD last seen in 03/2019   Hx of STEMI of the LAD 04/02/19, with hx of hypertension, HL, Diabetes, bioprosthetic aortic valve.  PCI/DES to the mLAD    Past Medical History:  Diagnosis Date   Aortic stenosis 05/2011   Severe by recent echo; AoV area ~0.77-79 cm2, peak /mean gradient 86 mmHg/ 55 mmHg.   Arthritis    back- low- [facility]- workmen's comp. situation    DDD (degenerative disc disease), lumbosacral    Depression    quick temper- per pt.    Diverticulosis    slight -per colonoscopy, pt. asymptomatic- thus far   GERD (gastroesophageal reflux disease)    Hiatal hernia    History of BPH    History of MRSA infection    Hyperlipidemia    Hypertension    [facility]- Dr. Ellyn Hack    Insomnia    Myocardial infarction [facility])    By report only, cardiac catheterization was negative for ischemia.   NIDDM (non-insulin dependent diabetes mellitus)    Type 2 NIDDM x 5 years   Prostate disease    Recurrent upper respiratory infection (URI)    cold- curently- "per pt., on the mend"   S/P AVR (aortic valve replacement) 08/05/2011   Preop cath showed normal coronaries; AVR--Dr. Servando Snare, MD; 23 mm Magna Ease bioprosthetic Aortic Valve   STEMI (ST elevation myocardial infarction) ([place])    PCI/DES x1 to mLAD    Past Surgical History:  Procedure Laterality Date   ABDOMINAL EXPOSURE N/A 04/01/2012   Procedure: ABDOMINAL EXPOSURE;  Surgeon: Angelia Mould, MD;  Location: [place];  Service: Vascular;  Laterality: N/A;   ANTERIOR LUMBAR FUSION N/A 04/01/2012   Procedure: ANTERIOR LUMBAR FUSION 1 LEVEL;  Surgeon: [name], MD;  Location: [place];  Service: Orthopedics;  Laterality: N/A;  ALIF L5-S1   AORTIC VALVE  REPLACEMENT  08/05/2011   Procedure: AORTIC VALVE REPLACEMENT (AVR);  Surgeon: [name], MD;  Location: [place];  Service: Open Heart Surgery;  Laterality: N/A;   CARDIAC CATHETERIZATION  06/2011    Minimal coronary disease   CORONARY/GRAFT ACUTE MI REVASCULARIZATION N/A 04/02/2019   Procedure: Coronary/Graft Acute MI Revascularization;  Surgeon: [name], MD;  Location: [place] CV LAB;  Service: Cardiovascular;  Laterality: N/A;   LEFT AND RIGHT HEART CATHETERIZATION WITH CORONARY ANGIOGRAM  07/03/2011   Procedure: LEFT AND RIGHT HEART CATHETERIZATION WITH CORONARY ANGIOGRAM;  Surgeon: [name], MD;  Location: [place] CATH [facility];  Service: Cardiovascular;;   LEFT HEART CATH AND CORONARY ANGIOGRAPHY N/A 04/02/2019   Procedure: LEFT HEART CATH AND CORONARY ANGIOGRAPHY;  Surgeon: [name], MD;  Location: [place] [facility];  Service: Cardiovascular;  Laterality: N/A;   LUMBAR FUSION  04/02/2012   Dr [name]   NASAL SEPTUM SURGERY     [facility] 10 yrs. ago   NM MY[facility]TD  April 2013   Low risk.     Current Outpatient Medications  Medication Sig Dispense Refill   acetaminophen (TYLENOL) 500 MG tablet Take 1,000 mg by mouth every 6 (six) hours as needed for headache (pain).     ascorbic acid (VITAMIN  C) 500 MG tablet Take 500 mg by mouth daily.     aspirin EC 81 MG EC tablet Take 1 tablet (81 mg total) by mouth daily. 90 tablet 1   atorvastatin (LIPITOR) 80 MG tablet Take 1 tablet (80 mg total) by mouth daily. 90 tablet 3   Blood Glucose Calibration (ACCU-CHEK AVIVA) SOLN Check BS bid DX: E11.9 1 each 3   Blood Glucose Monitoring Suppl (ACCU-CHEK AVIVA PLUS) w/Device KIT Check BS bid DX: E11.9 1 kit 3   BRILINTA 90 MG TABS tablet TAKE 1 TABLET BY MOUTH TWICE DAILY 180 tablet 1   Cholecalciferol (VITAMIN D3) 125 MCG (5000 UT) TABS Take 5,000 Units by mouth daily.     glipiZIDE (GLUCOTROL XL) 5 MG 24 hr tablet TAKE 1 TABLET BY MOUTH DAILY WITH BREAKFAST 30 tablet 4    glucose blood (ACCU-CHEK AVIVA PLUS) test strip Check BS bid DX: E11.9 200 each 3   JARDIANCE 25 MG TABS tablet TAKE 1 TABLET BY MOUTH BEFORE breakfast 30 tablet 5   Lancets (ACCU-CHEK MULTICLIX) lancets Check BS bid DX: E11.9 100 each 3   Lancets Misc. (ACCU-CHEK MULTICLIX LANCET DEV) KIT Check BS bid DX: E11.9 1 kit 3   meclizine (ANTIVERT) 25 MG tablet Take 1 tablet (25 mg total) by mouth 3 (three) times daily as needed for dizziness. 30 tablet 0   metFORMIN (GLUCOPHAGE-XR) 500 MG 24 hr tablet TAKE 4 TABLETS BY MOUTH EVERY DAY WITH BREAKFAST (Patient taking differently: Take 1,000 mg by mouth in the morning and at bedtime.) 120 tablet 3   metoprolol tartrate (LOPRESSOR) 25 MG tablet Take 1 tablet (25 mg total) by mouth 2 (two) times daily. 180 tablet 3   nitroGLYCERIN (NITROSTAT) 0.4 MG SL tablet Place 1 tablet (0.4 mg total) under the tongue every 5 (five) minutes x 3 doses as needed for chest pain. 25 tablet 2   VITAMIN E PO Take 1 capsule by mouth daily.     Zinc 50 MG TABS Take 50 mg by mouth daily.     No current facility-administered medications for this visit.    Allergies:   Tape    Social History:  The patient  reports that he has never smoked. He has never used smokeless tobacco. He reports that he does not drink alcohol and does not use drugs.   Family History:  The patient's ***family history includes Heart attack in his brother; Heart disease in his mother.    ROS:  General:no colds or fevers, no weight changes Skin:no rashes or ulcers HEENT:no blurred vision, no congestion CV:see HPI PUL:see HPI GI:no diarrhea constipation or melena, no indigestion GU:no hematuria, no dysuria MS:no joint pain, no claudication Neuro:no syncope, no lightheadedness Endo:no diabetes, no thyroid disease Wt Readings from Last 3 Encounters:  03/27/20 170 lb (77.1 kg)  07/26/19 176 lb (79.8 kg)  05/16/19 169 lb (76.7 kg)     PHYSICAL EXAM: VS:  There were no vitals taken for this  visit. , BMI There is no height or weight on file to calculate BMI. General:Pleasant affect, NAD Skin:Warm and dry, brisk capillary refill HEENT:normocephalic, sclera clear, mucus membranes moist Neck:supple, no JVD, no bruits  Heart:S1S2 RRR without murmur, gallup, rub or click Lungs:clear without rales, rhonchi, or wheezes TLX:BWIO, non tender, + BS, do not palpate liver spleen or masses Ext:no lower ext edema, 2+ pedal pulses, 2+ radial pulses Neuro:alert and oriented, MAE, follows commands, + facial symmetry    EKG:  EKG is ordered today.  The ekg ordered today demonstrates ***   Recent Labs: 08/15/2020: ALT 32; BUN 14; Creatinine, Ser 0.92; Hemoglobin 16.6; Platelets 134; Potassium 3.9; Sodium 138    Lipid Panel    Component Value Date/Time   CHOL 219 (H) 04/03/2019 0658   TRIG 384 (H) 04/03/2019 0658   HDL 27 (L) 04/03/2019 0658   CHOLHDL 8.1 04/03/2019 0658   VLDL 77 (H) 04/03/2019 0658   LDLCALC 115 (H) 04/03/2019 0658   LDLCALC 85 02/19/2018 0821   LDLDIRECT 134 (H) 07/26/2019 1458       Other studies Reviewed: Additional studies/ records that were reviewed today include: ***. Cardiac cath 04/02/19 1.  Severe single-vessel coronary artery disease with critical stenosis of the mid LAD, tandem lesions treated with a long 2.5 x 38 mm resolute Onyx DES 2.  Patent left main and left circumflex without significant stenosis 3.  Moderate mid RCA stenosis, does not appear flow obstructive 4.  Mild segmental LV contraction abnormality with hypokinesis of the distal anterior wall and anteroapex, LVEF estimated at 50 to 55%   Recommendation: Dual antiplatelet therapy with aspirin and ticagrelor x12 months without interruption.  Aggressive risk reduction measures.  Medical therapy for residual CAD.  If no complications arise, anticipate hospital discharge in 48 hours. Diagnostic Dominance: Right    Intervention      ASSESSMENT AND PLAN:   Echo 04/03/19 IMPRESSIONS      1. Left ventricular ejection fraction, by visual estimation, is 60 to  65%. The left ventricle has normal function. There is mildly increased  left ventricular hypertrophy.   2. Apex is abnormal.   3. Left ventricular diastolic parameters are consistent with Grade I  diastolic dysfunction (impaired relaxation).   4. The left ventricle demonstrates regional wall motion abnormalities.   5. Global right ventricle has normal systolic function.The right  ventricular size is normal. No increase in right ventricular wall  thickness.   6. Left atrial size was mild-moderately dilated.   7. Right atrial size was normal.   8. Severe mitral annular calcification.   9. The mitral valve is degenerative. Trivial mitral valve regurgitation.  10. The tricuspid valve is grossly normal.  11. The tricuspid valve is grossly normal. Tricuspid valve regurgitation  is trivial.  12. Aortic valve regurgitation is not visualized. No evidence of aortic  valve sclerosis or stenosis.  13. There is an [employer] 23 mm pericardial tissue valve in the  AV position. It is functioning normally.  14. The pulmonic valve was grossly normal. Pulmonic valve regurgitation is  trivial.  15. The interatrial septum was not well visualized.   FINDINGS   Left Ventricle: Left ventricular ejection fraction, by visual estimation,  is 60 to 65%. The left ventricle has normal function. The left ventricle  demonstrates regional wall motion abnormalities. The left ventricular  internal cavity size was the left  ventricle is normal in size. There is mildly increased left ventricular  hypertrophy. Concentric left ventricular hypertrophy. Left ventricular  diastolic parameters are consistent with Grade I diastolic dysfunction  (impaired relaxation).      LV Wall Scoring:  The apex is hypokinetic.   Right Ventricle: The right ventricular size is normal. No increase in  right ventricular wall thickness. Global RV  systolic function is has  normal systolic function.   Left Atrium: Left atrial size was mild-moderately dilated.   Right Atrium: Right atrial size was normal in size   Pericardium: There is no evidence of pericardial effusion.  Mitral Valve: The mitral valve is degenerative in appearance. There is  mild thickening of the mitral valve leaflet(s). There is mild  calcification of the mitral valve leaflet(s). Severe mitral annular  calcification. Trivial mitral valve regurgitation.   Tricuspid Valve: The tricuspid valve is grossly normal. Tricuspid valve  regurgitation is trivial.   Aortic Valve: The aortic valve has been repaired/replaced. Aortic valve  regurgitation is not visualized. The aortic valve is structurally normal,  with no evidence of sclerosis or stenosis. Aortic valve mean gradient  measures 10.0 mmHg. Aortic valve peak  gradient measures 17.0 mmHg. Bioprosthetic aortic valve valve is present  in the aortic position. Echo findings show normal structure and function  of the aortic prosthesis. There is an [employer] 23 mm  pericardial tissue valve in the AV  position. It is functioning normally.   Pulmonic Valve: The pulmonic valve was grossly normal. Pulmonic valve  regurgitation is trivial. Pulmonic regurgitation is trivial.   Aorta: The aortic root is normal in size and structure.   Venous: The inferior vena cava was not well visualized.   1.  ***   Current medicines are reviewed with the patient today.  The patient Has no concerns regarding medicines.  The following changes have been made:  See above Labs/ tests ordered today include:see above  Disposition:   FU:  see above  Signed, Cecilie Kicks, NP  09/20/2020 7:51 PM    [facility] [place], [place], [place], [place] Phone: [phone]; Fax: [phone]

## 2020-09-21 ENCOUNTER — Ambulatory Visit: Payer: BC Managed Care – PPO | Admitting: Cardiology

## 2020-10-24 ENCOUNTER — Emergency Department (HOSPITAL_COMMUNITY): Payer: Medicare Other

## 2020-10-24 ENCOUNTER — Inpatient Hospital Stay (HOSPITAL_COMMUNITY)
Admission: EM | Admit: 2020-10-24 | Discharge: 2020-10-27 | DRG: 065 | Disposition: A | Discharge status: Home Health | Payer: Medicare Other | Attending: Internal Medicine | Admitting: Internal Medicine

## 2020-10-24 ENCOUNTER — Encounter (HOSPITAL_COMMUNITY): Payer: Self-pay | Admitting: Emergency Medicine

## 2020-10-24 ENCOUNTER — Observation Stay (HOSPITAL_COMMUNITY): Payer: Medicare Other

## 2020-10-24 ENCOUNTER — Other Ambulatory Visit: Payer: Self-pay

## 2020-10-24 DIAGNOSIS — E782 Mixed hyperlipidemia: Secondary | ICD-10-CM

## 2020-10-24 DIAGNOSIS — Z981 Arthrodesis status: Secondary | ICD-10-CM

## 2020-10-24 DIAGNOSIS — Z7984 Long term (current) use of oral hypoglycemic drugs: Secondary | ICD-10-CM

## 2020-10-24 DIAGNOSIS — F32A Depression, unspecified: Secondary | ICD-10-CM | POA: Diagnosis present

## 2020-10-24 DIAGNOSIS — M5137 Other intervertebral disc degeneration, lumbosacral region: Secondary | ICD-10-CM | POA: Diagnosis present

## 2020-10-24 DIAGNOSIS — I252 Old myocardial infarction: Secondary | ICD-10-CM

## 2020-10-24 DIAGNOSIS — G4700 Insomnia, unspecified: Secondary | ICD-10-CM | POA: Diagnosis present

## 2020-10-24 DIAGNOSIS — R2990 Unspecified symptoms and signs involving the nervous system: Secondary | ICD-10-CM

## 2020-10-24 DIAGNOSIS — N400 Enlarged prostate without lower urinary tract symptoms: Secondary | ICD-10-CM | POA: Diagnosis present

## 2020-10-24 DIAGNOSIS — E559 Vitamin D deficiency, unspecified: Secondary | ICD-10-CM | POA: Diagnosis present

## 2020-10-24 DIAGNOSIS — Z7982 Long term (current) use of aspirin: Secondary | ICD-10-CM

## 2020-10-24 DIAGNOSIS — I10 Essential (primary) hypertension: Secondary | ICD-10-CM

## 2020-10-24 DIAGNOSIS — Z20822 Contact with and (suspected) exposure to covid-19: Secondary | ICD-10-CM | POA: Diagnosis present

## 2020-10-24 DIAGNOSIS — R29701 NIHSS score 1: Secondary | ICD-10-CM | POA: Diagnosis present

## 2020-10-24 DIAGNOSIS — N429 Disorder of prostate, unspecified: Secondary | ICD-10-CM | POA: Diagnosis present

## 2020-10-24 DIAGNOSIS — E1165 Type 2 diabetes mellitus with hyperglycemia: Secondary | ICD-10-CM | POA: Diagnosis present

## 2020-10-24 DIAGNOSIS — I639 Cerebral infarction, unspecified: Secondary | ICD-10-CM | POA: Diagnosis present

## 2020-10-24 DIAGNOSIS — R270 Ataxia, unspecified: Secondary | ICD-10-CM | POA: Diagnosis present

## 2020-10-24 DIAGNOSIS — K219 Gastro-esophageal reflux disease without esophagitis: Secondary | ICD-10-CM | POA: Diagnosis present

## 2020-10-24 DIAGNOSIS — I69351 Hemiplegia and hemiparesis following cerebral infarction affecting right dominant side: Secondary | ICD-10-CM

## 2020-10-24 DIAGNOSIS — R471 Dysarthria and anarthria: Secondary | ICD-10-CM | POA: Diagnosis present

## 2020-10-24 DIAGNOSIS — I350 Nonrheumatic aortic (valve) stenosis: Secondary | ICD-10-CM | POA: Diagnosis present

## 2020-10-24 DIAGNOSIS — E08649 Diabetes mellitus due to underlying condition with hypoglycemia without coma: Secondary | ICD-10-CM

## 2020-10-24 DIAGNOSIS — Z953 Presence of xenogenic heart valve: Secondary | ICD-10-CM

## 2020-10-24 DIAGNOSIS — Z8249 Family history of ischemic heart disease and other diseases of the circulatory system: Secondary | ICD-10-CM

## 2020-10-24 DIAGNOSIS — Z91048 Other nonmedicinal substance allergy status: Secondary | ICD-10-CM

## 2020-10-24 DIAGNOSIS — I6381 Other cerebral infarction due to occlusion or stenosis of small artery: Principal | ICD-10-CM | POA: Diagnosis present

## 2020-10-24 DIAGNOSIS — I2510 Atherosclerotic heart disease of native coronary artery without angina pectoris: Secondary | ICD-10-CM | POA: Diagnosis present

## 2020-10-24 DIAGNOSIS — Z79899 Other long term (current) drug therapy: Secondary | ICD-10-CM

## 2020-10-24 DIAGNOSIS — Z8614 Personal history of Methicillin resistant Staphylococcus aureus infection: Secondary | ICD-10-CM

## 2020-10-24 LAB — PROTIME-INR
INR: 1 (ref 0.8–1.2)
Prothrombin Time: 13.2 seconds (ref 11.4–15.2)

## 2020-10-24 LAB — COMPREHENSIVE METABOLIC PANEL
ALT: 42 U/L (ref 0–44)
AST: 24 U/L (ref 15–41)
Albumin: 3.9 g/dL (ref 3.5–5.0)
Alkaline Phosphatase: 56 U/L (ref 38–126)
Anion gap: 7 (ref 5–15)
BUN: 17 mg/dL (ref 8–23)
CO2: 23 mmol/L (ref 22–32)
Calcium: 8.8 mg/dL — ABNORMAL LOW (ref 8.9–10.3)
Chloride: 106 mmol/L (ref 98–111)
Creatinine, Ser: 0.73 mg/dL (ref 0.61–1.24)
GFR, Estimated: >60 mL/min (ref >60)
Glucose, Bld: 187 mg/dL — ABNORMAL HIGH (ref 70–99)
Potassium: 3.9 mmol/L (ref 3.5–5.1)
Sodium: 136 mmol/L (ref 135–145)
Total Bilirubin: 0.7 mg/dL (ref 0.3–1.2)
Total Protein: 6.7 g/dL (ref 6.5–8.1)

## 2020-10-24 LAB — URINALYSIS, ROUTINE W REFLEX MICROSCOPIC
Bacteria, UA: NONE SEEN
Bilirubin Urine: NEGATIVE
Glucose, UA: >=500 mg/dL — AB
Hgb urine dipstick: NEGATIVE
Ketones, ur: 20 mg/dL — AB
Leukocytes,Ua: NEGATIVE
Nitrite: NEGATIVE
Protein, ur: NEGATIVE mg/dL
Specific Gravity, Urine: 1.029 (ref 1.005–1.030)
pH: 6 (ref 5.0–8.0)

## 2020-10-24 LAB — RAPID URINE DRUG SCREEN, HOSP PERFORMED
Amphetamines: NOT DETECTED
Barbiturates: NOT DETECTED
Benzodiazepines: NOT DETECTED
Cocaine: NOT DETECTED
Opiates: NOT DETECTED
Tetrahydrocannabinol: NOT DETECTED

## 2020-10-24 LAB — HEMOGLOBIN A1C
Hgb A1c MFr Bld: 8.9 % — ABNORMAL HIGH (ref 4.8–5.6)
Mean Plasma Glucose: 208.73 mg/dL

## 2020-10-24 LAB — DIFFERENTIAL
Abs Immature Granulocytes: 0.02 10*3/uL (ref 0.00–0.07)
Basophils Absolute: 0 10*3/uL (ref 0.0–0.1)
Basophils Relative: 1 %
Eosinophils Absolute: 0.4 10*3/uL (ref 0.0–0.5)
Eosinophils Relative: 7 %
Immature Granulocytes: 0 %
Lymphocytes Relative: 26 %
Lymphs Abs: 1.8 10*3/uL (ref 0.7–4.0)
Monocytes Absolute: 0.4 10*3/uL (ref 0.1–1.0)
Monocytes Relative: 6 %
Neutro Abs: 4.1 10*3/uL (ref 1.7–7.7)
Neutrophils Relative %: 60 %

## 2020-10-24 LAB — LIPID PANEL
Cholesterol: 229 mg/dL — ABNORMAL HIGH (ref 0–200)
HDL: 37 mg/dL — ABNORMAL LOW (ref >40)
LDL Cholesterol: 160 mg/dL — ABNORMAL HIGH (ref 0–99)
Total CHOL/HDL Ratio: 6.2 RATIO
Triglycerides: 161 mg/dL — ABNORMAL HIGH (ref <150)
VLDL: 32 mg/dL (ref 0–40)

## 2020-10-24 LAB — GLUCOSE, CAPILLARY
Glucose-Capillary: 107 mg/dL — ABNORMAL HIGH (ref 70–99)
Glucose-Capillary: 155 mg/dL — ABNORMAL HIGH (ref 70–99)

## 2020-10-24 LAB — CBC
HCT: 46.4 % (ref 39.0–52.0)
Hemoglobin: 16.1 g/dL (ref 13.0–17.0)
MCH: 33.5 pg (ref 26.0–34.0)
MCHC: 34.7 g/dL (ref 30.0–36.0)
MCV: 96.7 fL (ref 80.0–100.0)
Platelets: 133 10*3/uL — ABNORMAL LOW (ref 150–400)
RBC: 4.8 MIL/uL (ref 4.22–5.81)
RDW: 12.2 % (ref 11.5–15.5)
WBC: 6.8 10*3/uL (ref 4.0–10.5)
nRBC: 0 % (ref 0.0–0.2)

## 2020-10-24 LAB — APTT: aPTT: 26 seconds (ref 24–36)

## 2020-10-24 LAB — I-STAT CHEM 8, ED
BUN: 17 mg/dL (ref 8–23)
Calcium, Ion: 1.17 mmol/L (ref 1.15–1.40)
Chloride: 105 mmol/L (ref 98–111)
Creatinine, Ser: 0.8 mg/dL (ref 0.61–1.24)
Glucose, Bld: 182 mg/dL — ABNORMAL HIGH (ref 70–99)
HCT: 48 % (ref 39.0–52.0)
Hemoglobin: 16.3 g/dL (ref 13.0–17.0)
Potassium: 4.2 mmol/L (ref 3.5–5.1)
Sodium: 140 mmol/L (ref 135–145)
TCO2: 28 mmol/L (ref 22–32)

## 2020-10-24 LAB — MAGNESIUM: Magnesium: 1.9 mg/dL (ref 1.7–2.4)

## 2020-10-24 LAB — RESP PANEL BY RT-PCR (FLU A&B, COVID) ARPGX2
Influenza A by PCR: NEGATIVE
Influenza B by PCR: NEGATIVE
SARS Coronavirus 2 by RT PCR: NEGATIVE

## 2020-10-24 LAB — TSH: TSH: 0.967 u[IU]/mL (ref 0.350–4.500)

## 2020-10-24 LAB — VITAMIN D 25 HYDROXY (VIT D DEFICIENCY, FRACTURES): Vit D, 25-Hydroxy: 39.55 ng/mL (ref 30–100)

## 2020-10-24 IMAGING — US US CAROTID DUPLEX BILAT
1 series · 13 of 24 positions shown · non-contrast
Comparison: None.

CLINICAL DATA: Cerebral infarction.

EXAM:
BILATERAL CAROTID DUPLEX ULTRASOUND
TECHNIQUE: Gray scale imaging, color Doppler and duplex ultrasound were
performed of bilateral carotid and vertebral arteries in the neck.

[Series 1: us carotid bilateral · 13 of 68 slices shown]
[im 1/68]
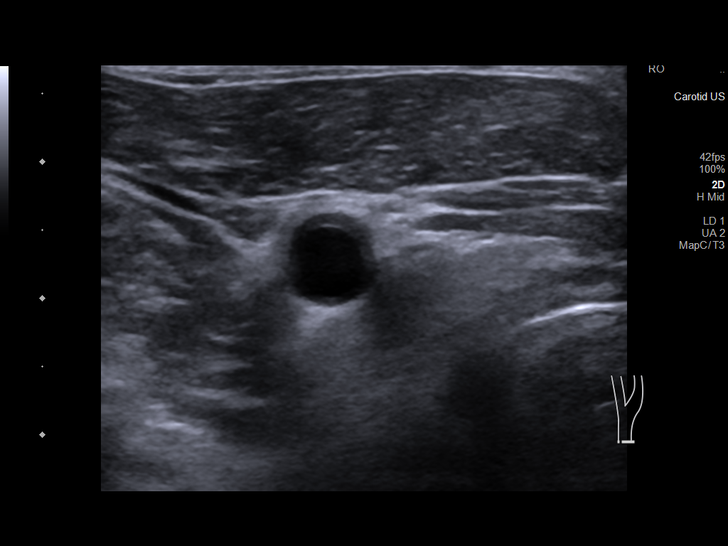
[im 6/68]
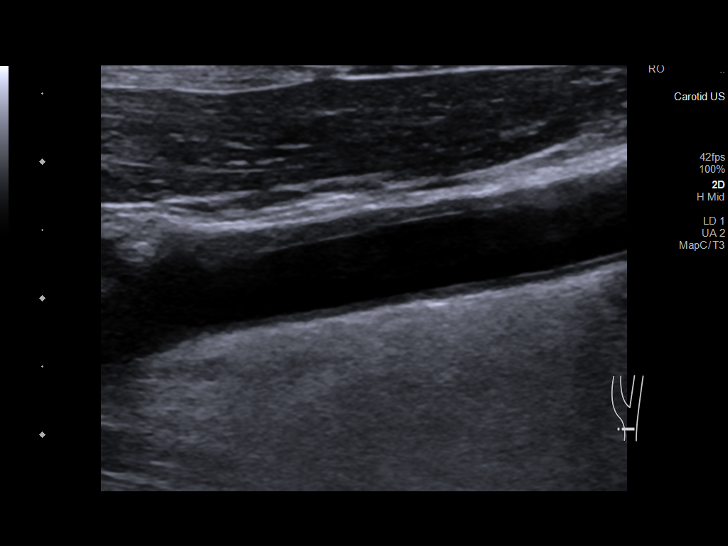
[im 12/68]
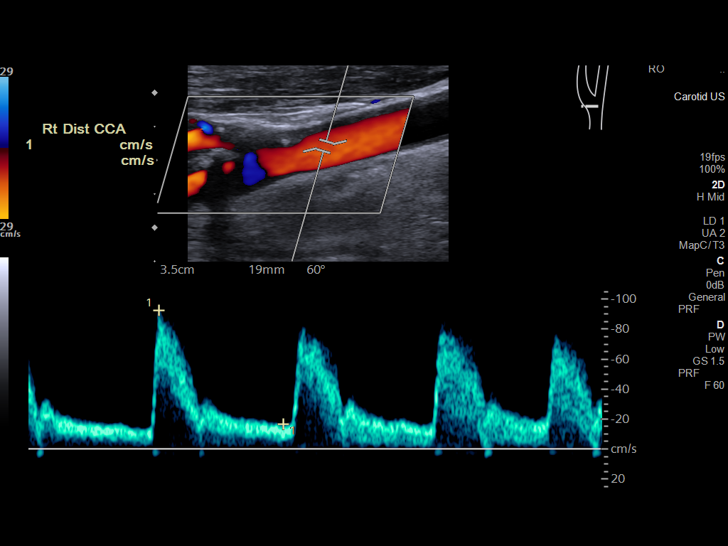
[im 18/68]
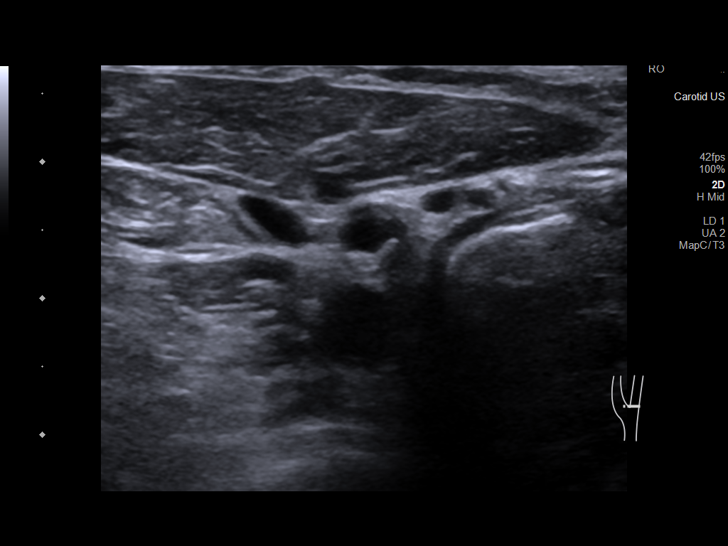
[im 24/68]
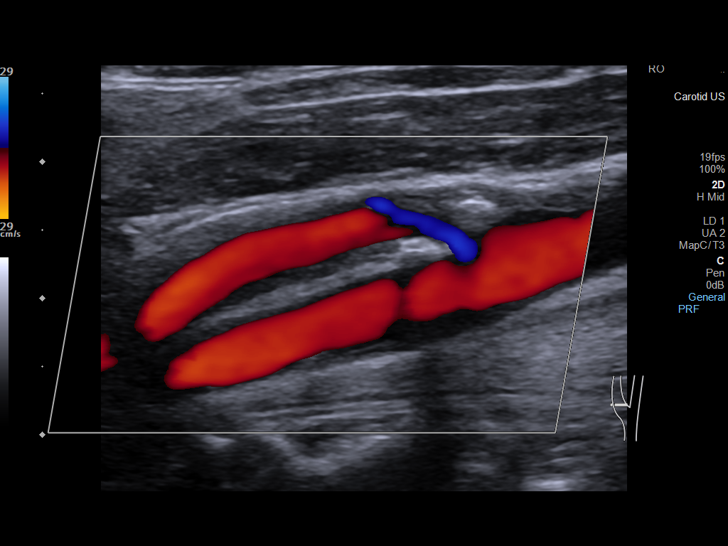
[im 30/68]
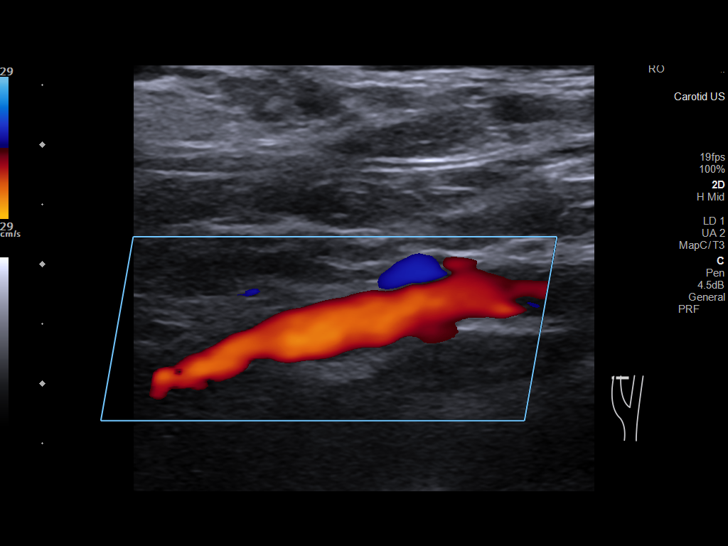
[im 35/68]
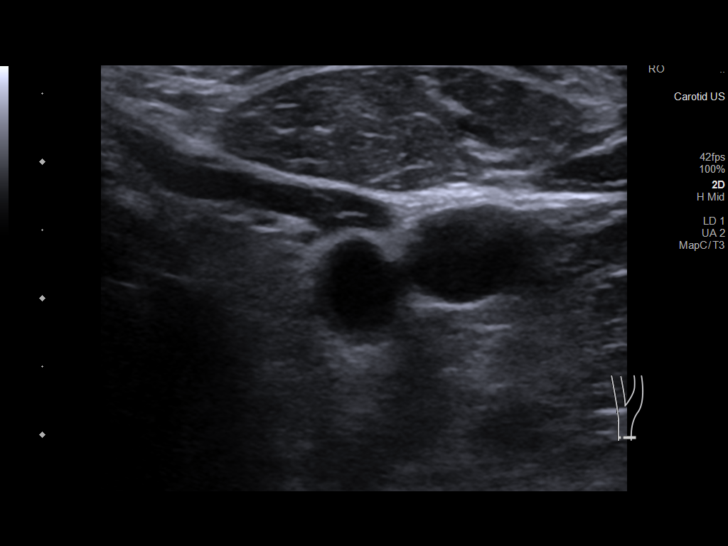
[im 38/68]
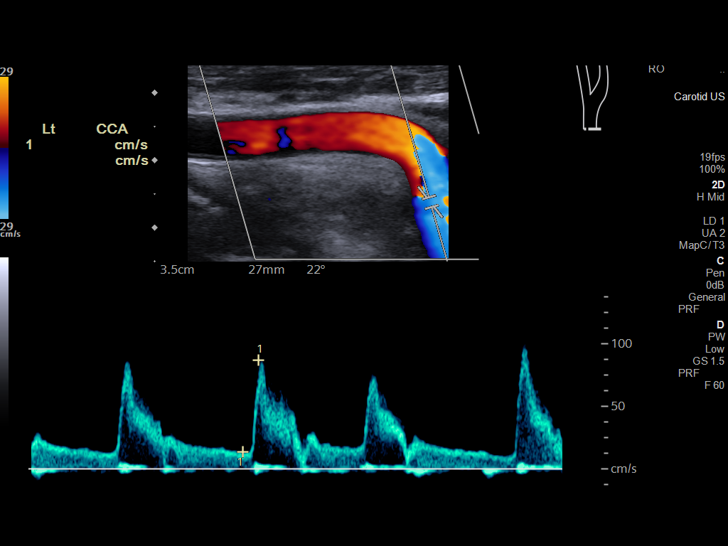
[im 44/68]
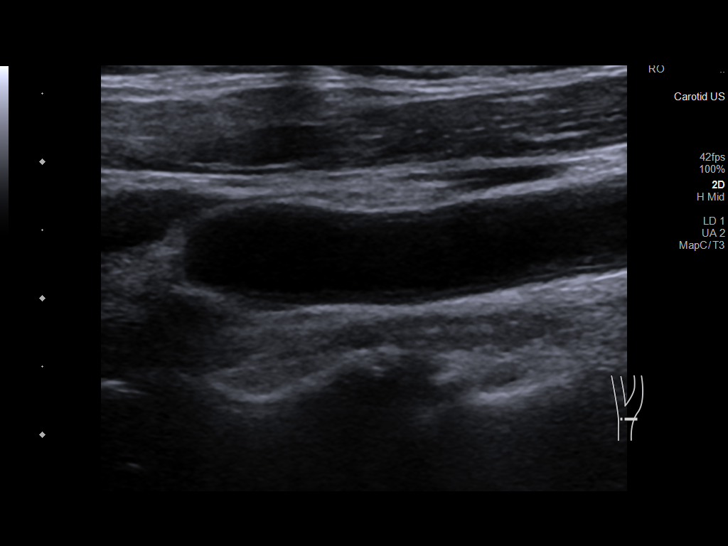
[im 50/68]
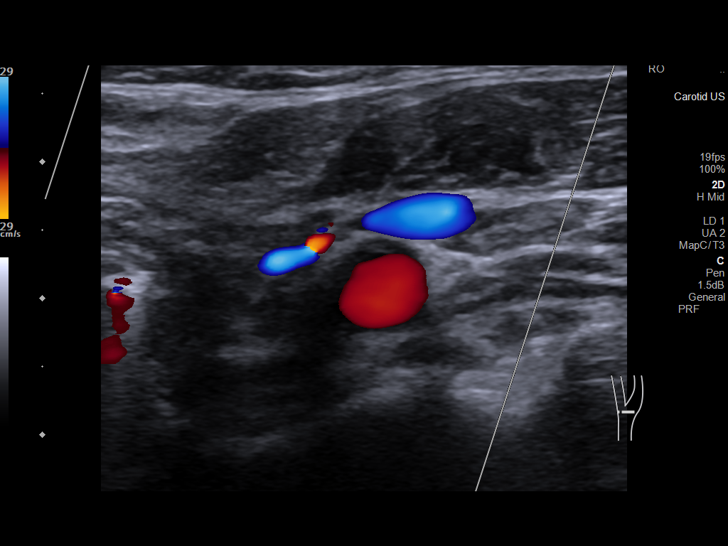
[im 56/68]
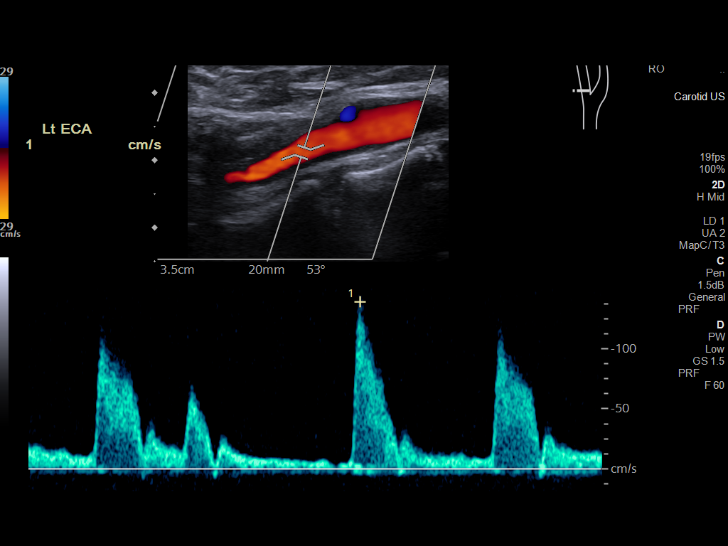
[im 62/68]
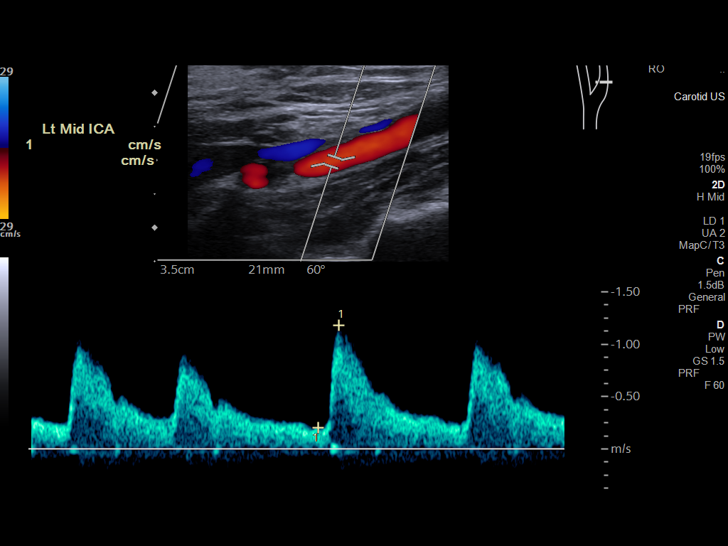
[im 68/68]
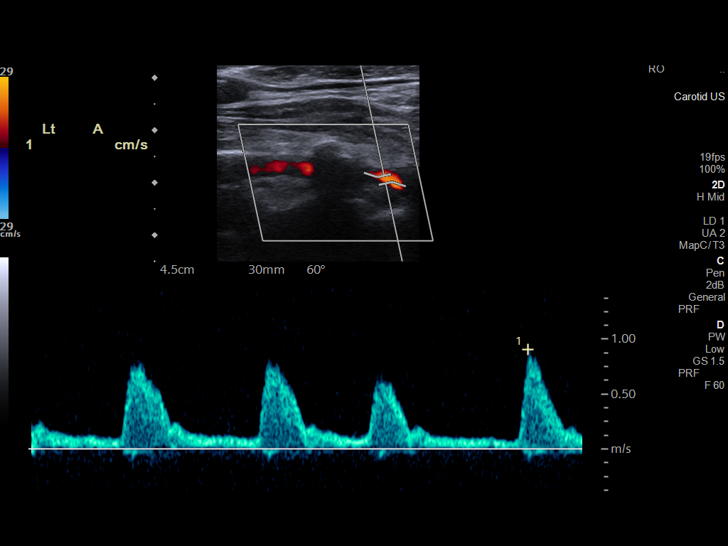

[13 of 24 positions shown; findings below may reference images not displayed]

FINDINGS: Criteria: Quantification of carotid stenosis is based on velocity
parameters that correlate the residual internal carotid diameter
with NASCET-based stenosis levels, using the diameter of the distal
internal carotid lumen as the denominator for stenosis measurement.

The following velocity measurements were obtained:

RIGHT

ICA:  105/19 cm/sec

CCA:  105/13 cm/sec

SYSTOLIC ICA/CCA RATIO:

ECA:  171 cm/sec

LEFT

ICA:  146/36 distal, 84/15 proximal cm/sec

CCA:  104/15 cm/sec

SYSTOLIC ICA/CCA RATIO:

ECA:  139 cm/sec

RIGHT CAROTID ARTERY: Relatively mild amount of partially calcified
plaque at the level of the carotid bulb and proximal right ICA.
Moderate plaque present at the level of the proximal external
carotid artery. Estimated right ICA stenosis is less than 50%.

RIGHT VERTEBRAL ARTERY: Antegrade flow with normal waveform and
velocity.

LEFT CAROTID ARTERY: Mild amount of partially calcified plaque at
the level of the carotid bulb and left ICA. Estimated left ICA
stenosis is less than 50%. Some velocity elevation of the more
distal cervical segment of the left ICA appears to be in a tortuous
segment.

LEFT VERTEBRAL ARTERY: Antegrade flow with normal waveform and
velocity.
IMPRESSION: Plaque at the level of both carotid bulbs and proximal internal
carotid arteries. No significant carotid stenosis identified with
estimated bilateral ICA stenoses of less than 50%.

## 2020-10-24 IMAGING — MR MR HEAD W/O CM
9 of 10 series · 39 of 48 positions shown · non-contrast
Comparison: No pertinent prior exam.

CT head [DATE]

CLINICAL DATA: Gait difficulty, history of CVA

EXAM:
MRI HEAD WITHOUT CONTRAST
MRA HEAD WITHOUT CONTRAST
TECHNIQUE: Multiplanar, multi-echo pulse sequences of the brain and surrounding
structures were acquired without intravenous contrast. Angiographic
images of the Circle of Willis were acquired using MRA technique
without intravenous contrast.

[Series 5: DWI · axial · 4.0mm · 0.88mm/px · z∈[-55,+79]mm · 4 of 36 slices shown (1 of 4)]
[im 1/36]
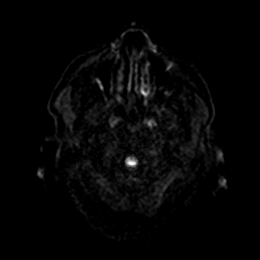
[im 12/36]
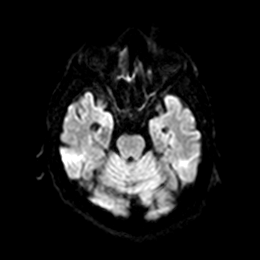
[im 24/36]
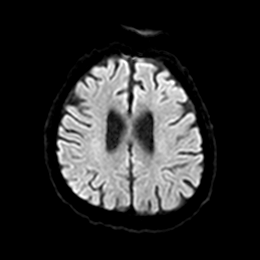
[im 36/36]
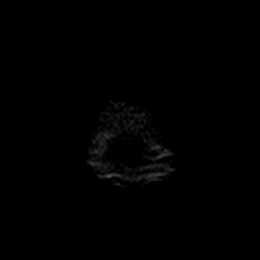

[Series 6: DWI · axial · 4.0mm · 0.88mm/px · z∈[-55,+79]mm · 5 of 35 slices shown (2 of 4)]
[im 1/35]
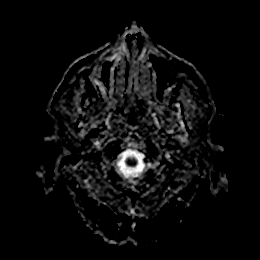
[im 9/35]
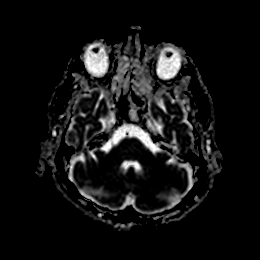
[im 18/35]
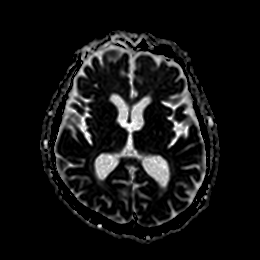
[im 26/35]
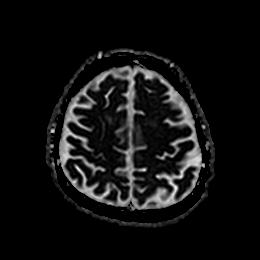
[im 35/35]
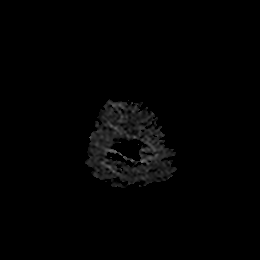

[Series 7: DWI · coronal · 4.0mm · 0.88mm/px · 5 of 32 slices shown (3 of 4)]
[im 1/32]
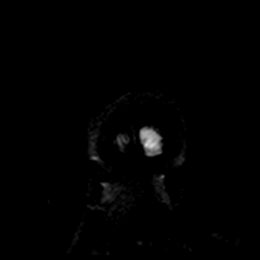
[im 8/32]
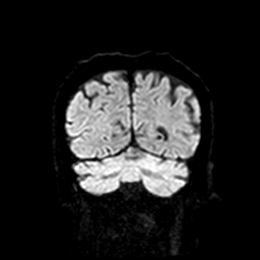
[im 16/32]
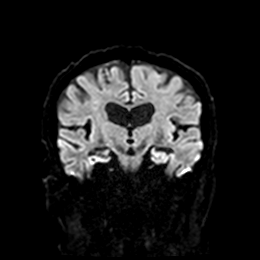
[im 24/32]
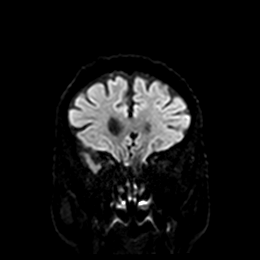
[im 32/32]
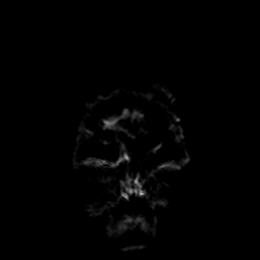

[Series 8: DWI · coronal · 4.0mm · 0.88mm/px · 5 of 32 slices shown (4 of 4)]
[im 1/32]
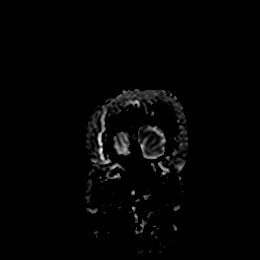
[im 8/32]
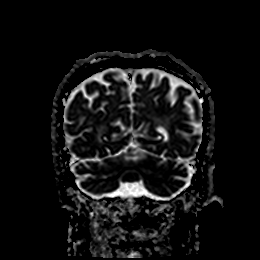
[im 16/32]
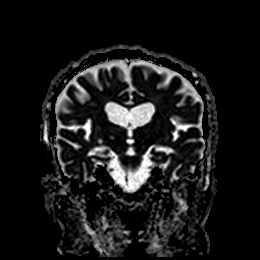
[im 24/32]
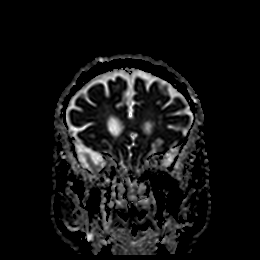
[im 32/32]
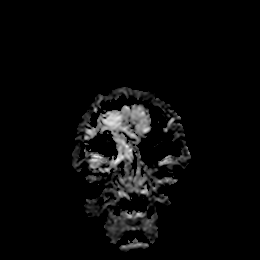

[Series 14: T1 · sagittal · 5.0mm · 0.80mm/px · 3 of 23 slices shown]
[im 1/23]
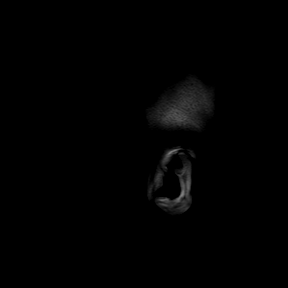
[im 12/23]
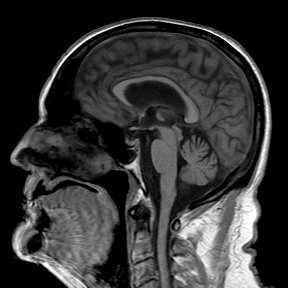
[im 23/23]
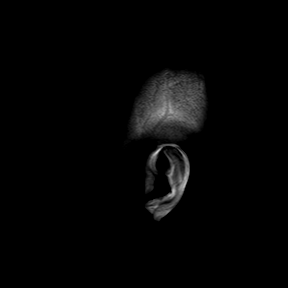

[Series 15: T2 · axial · 5.0mm · 0.72mm/px · z∈[-61,+86]mm · 3 of 23 slices shown (1 of 2)]
[im 1/23]
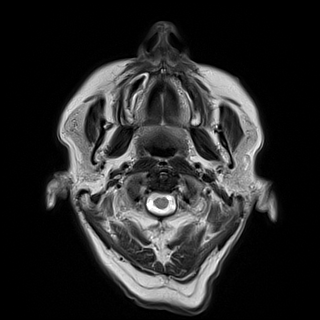
[im 12/23]
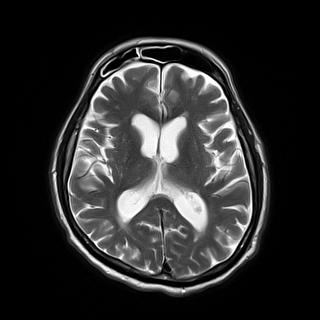
[im 23/23]
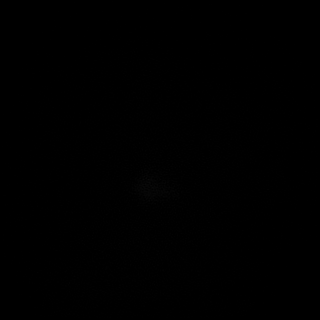

[Series 16: ax hemo · axial · 5.0mm · 0.86mm/px · z∈[-60,+78]mm · 4 of 25 slices shown]
[im 1/25]
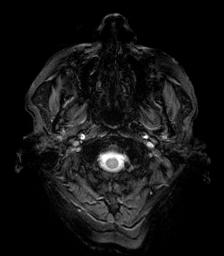
[im 9/25]
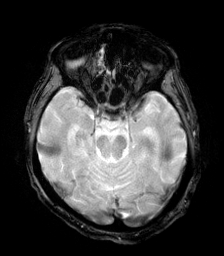
[im 17/25]
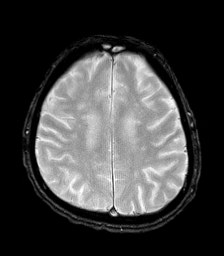
[im 25/25]
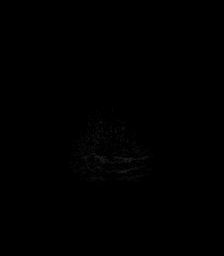

[Series 17: FLAIR · axial · 4.0mm · 0.43mm/px · z∈[-62,+80]mm · 6 of 38 slices shown]
[im 1/38]
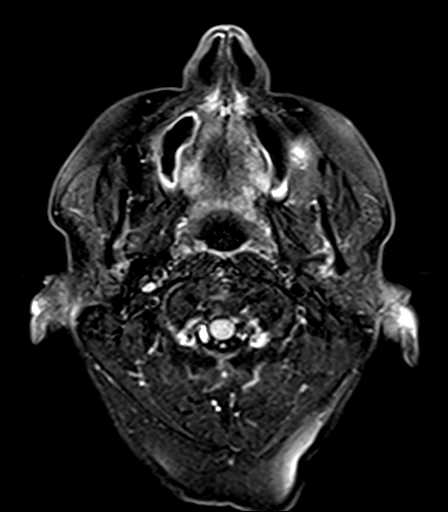
[im 8/38]
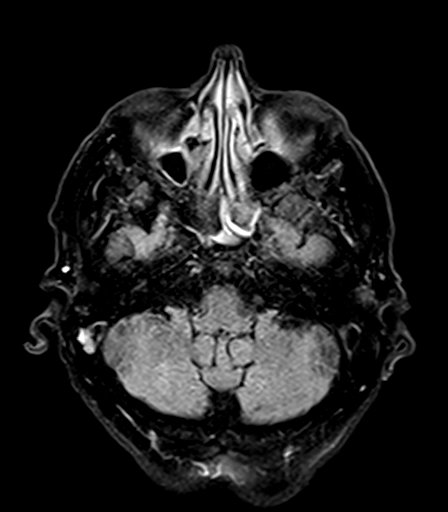
[im 15/38]
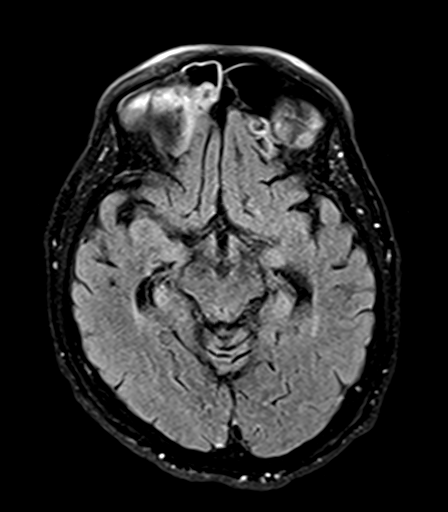
[im 23/38]
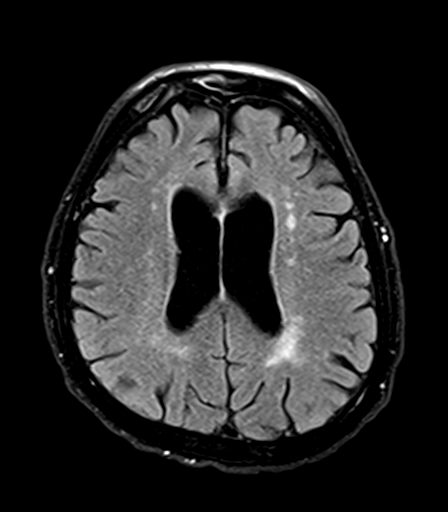
[im 30/38]
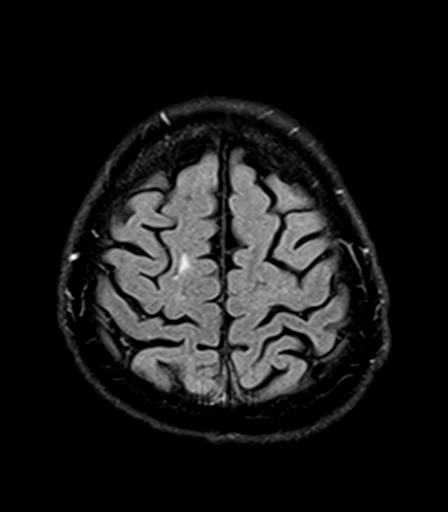
[im 38/38]
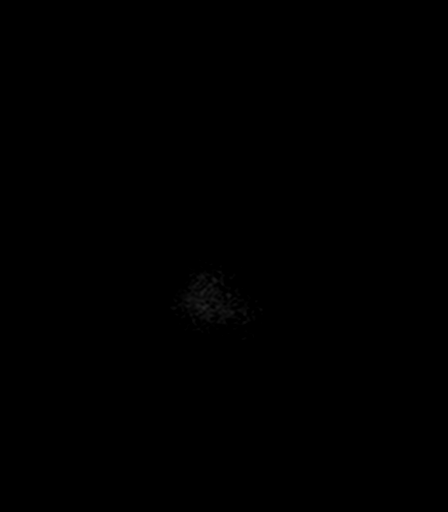

[Series 19: T2 · coronal · 5.0mm · 0.72mm/px · 4 of 28 slices shown (2 of 2)]
[im 1/28]
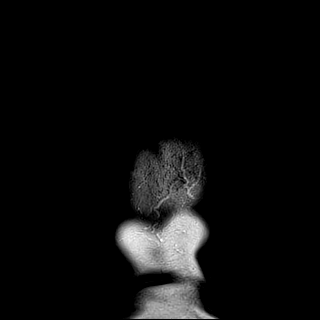
[im 10/28]
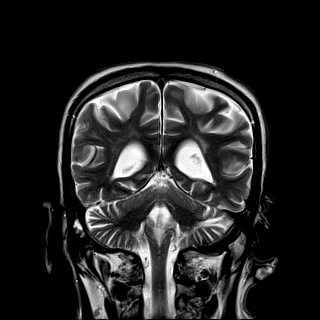
[im 19/28]
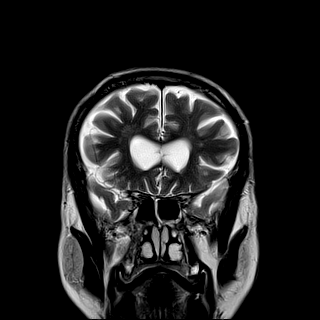
[im 28/28]
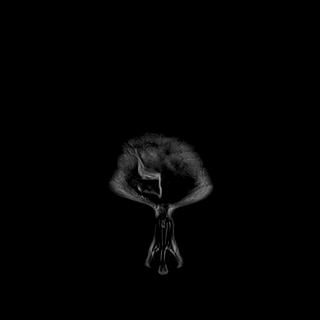

[39 of 48 positions shown; findings below may reference images not displayed]

FINDINGS: MRI HEAD FINDINGS

Brain: There is a small focus of diffusion restriction in the left
centrum semiovale with faint associated FLAIR signal abnormality.
There is a punctate chronic microhemorrhage in the left parietal
lobe. There is additional cortically based T2* hypointensity in the
right frontal lobe near the vertex likely reflecting prior
hemorrhage. There are remote lacunar infarcts in the bilateral
cerebellar hemispheres and basal ganglia.

There is patchy FLAIR signal abnormality throughout the remainder of
the subcortical and periventricular white matter, nonspecific but
likely reflecting sequela of chronic white matter microangiopathy.
There is mild parenchymal volume loss with commensurate enlargement
of the ventricular system. There is no mass lesion. There is no
midline shift.

Vascular: Normal flow voids.

Skull and upper cervical spine: Normal marrow signal.

Sinuses/Orbits: There is mild mucosal thickening throughout the
paranasal sinuses. The globes and orbits are unremarkable.

Other: None.

MRA HEAD FINDINGS

Anterior circulation: The intracranial ICAs are patent. The
bilateral MCAs and ACAs are patent. No significant stenosis,
occlusion, dissection, or aneurysm is identified.

Posterior circulation: The V4 segments of the vertebral arteries are
patent. The right posterior cerebral artery is occluded at the P1/P2
junction. There is severe stenosis of the proximal left P2 segment
measuring approximately 7 mm. The distal left PCA is patent.

Anatomic variants: None.
IMPRESSION: 1. Small acute infarct in the left centrum semiovale.
2. Occluded right PCA at the P1/P2 junction and severe stenosis of
the left P2 segment with distal reconstitution. These findings are
favored to be chronic given lack of ischemia in the occipital lobes.
3. Small remote hemorrhage in the right frontal lobe near the vertex
and small remote lacunar infarcts in the bilateral basal ganglia and
cerebellar hemispheres.
4. Background of parenchymal volume loss and mild chronic white
matter microangiopathy.

## 2020-10-24 IMAGING — MR MR MRA HEAD W/O CM
1 series · 48 of 48 positions shown · non-contrast
Comparison: No pertinent prior exam.

CT head [DATE]

CLINICAL DATA: Gait difficulty, history of CVA

EXAM:
MRI HEAD WITHOUT CONTRAST
MRA HEAD WITHOUT CONTRAST
TECHNIQUE: Multiplanar, multi-echo pulse sequences of the brain and surrounding
structures were acquired without intravenous contrast. Angiographic
images of the Circle of Willis were acquired using MRA technique
without intravenous contrast.

[Series 100: TOF · axial · 0.5mm · 0.76mm/px · z∈[-55,+20]mm · 48 of 168 slices shown]
[im 1/168]
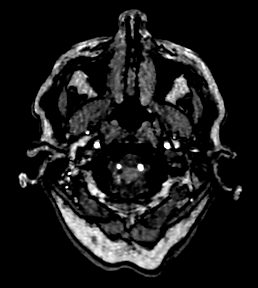
[im 4/168]
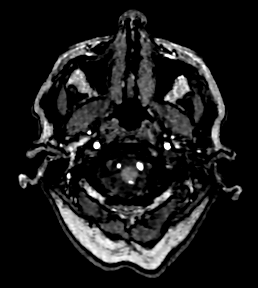
[im 8/168]
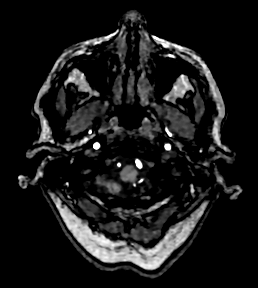
[im 11/168]
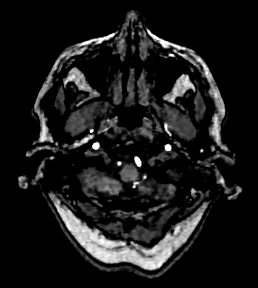
[im 15/168]
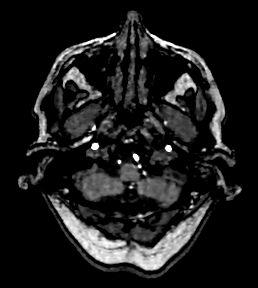
[im 18/168]
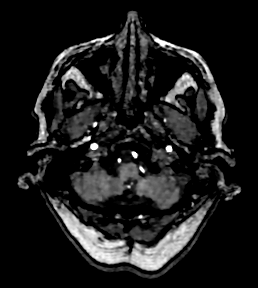
[im 22/168]
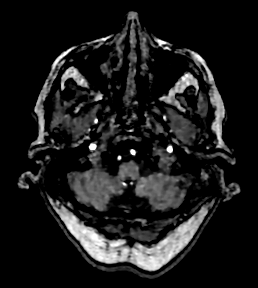
[im 25/168]
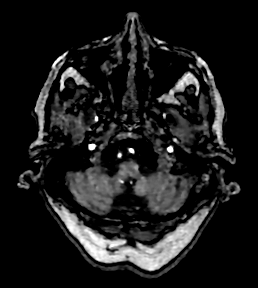
[im 29/168]
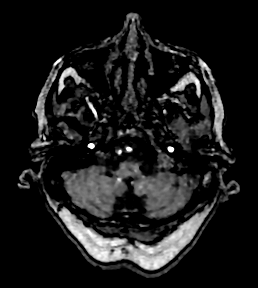
[im 32/168]
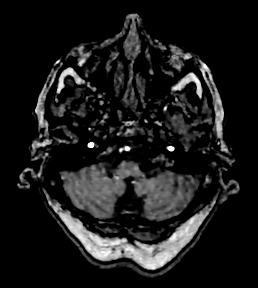
[im 36/168]
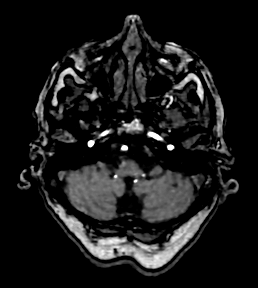
[im 40/168]
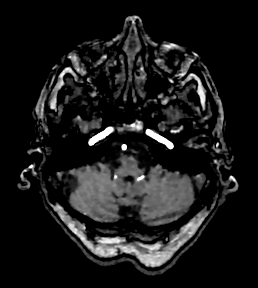
[im 43/168]
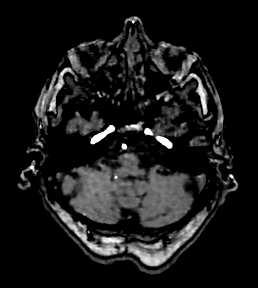
[im 47/168]
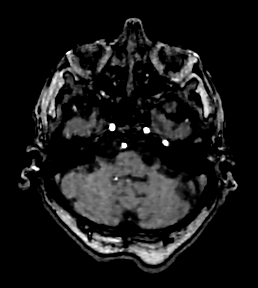
[im 50/168]
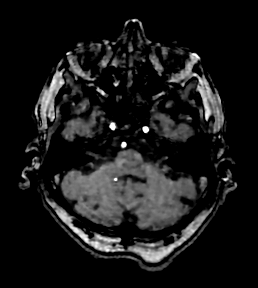
[im 54/168]
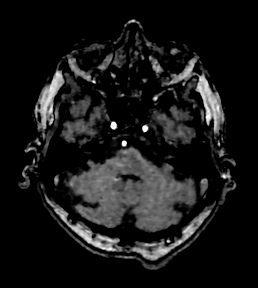
[im 57/168]
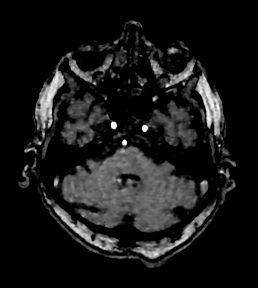
[im 61/168]
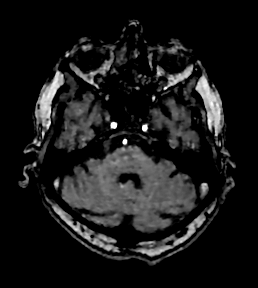
[im 64/168]
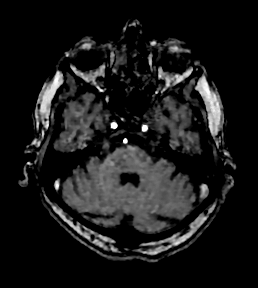
[im 68/168]
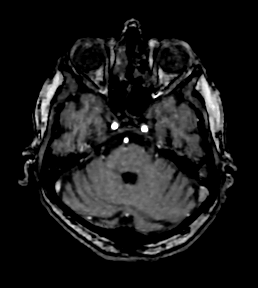
[im 72/168]
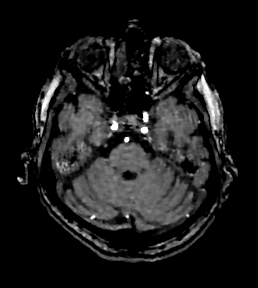
[im 75/168]
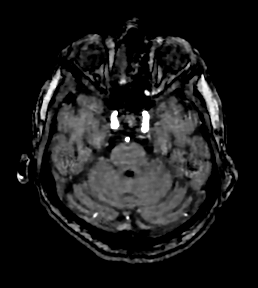
[im 79/168]
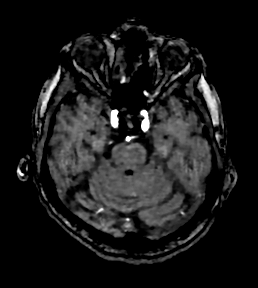
[im 82/168]
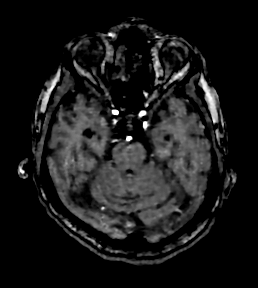
[im 86/168]
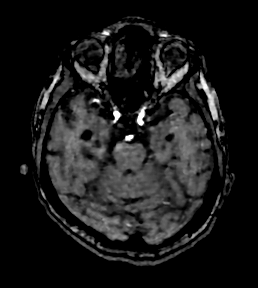
[im 89/168]
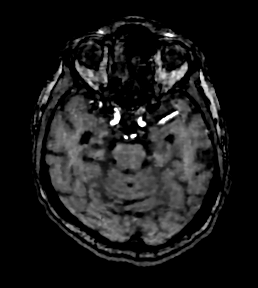
[im 93/168]
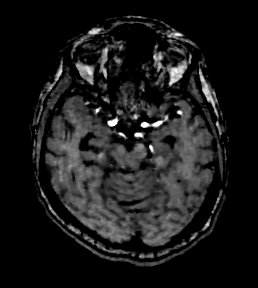
[im 96/168]
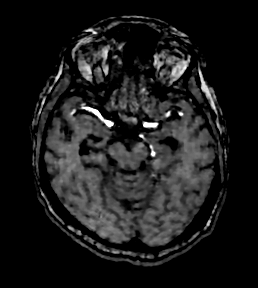
[im 100/168]
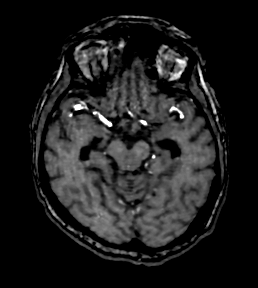
[im 104/168]
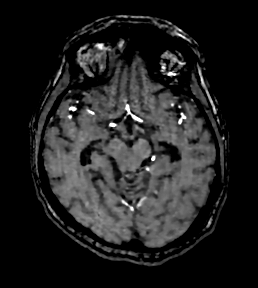
[im 107/168]
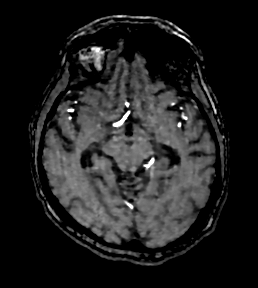
[im 111/168]
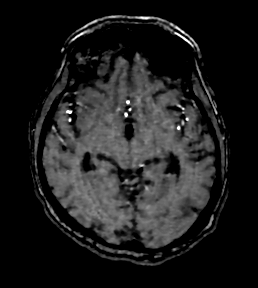
[im 114/168]
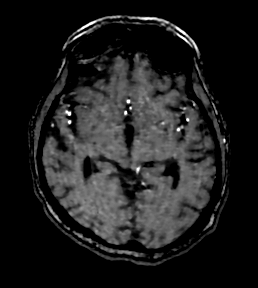
[im 118/168]
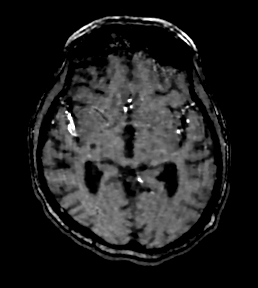
[im 121/168]
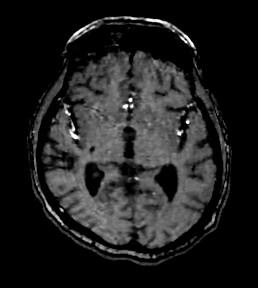
[im 125/168]
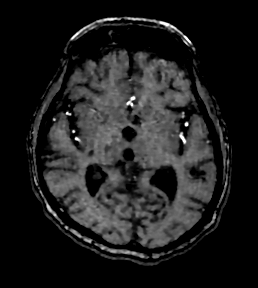
[im 128/168]
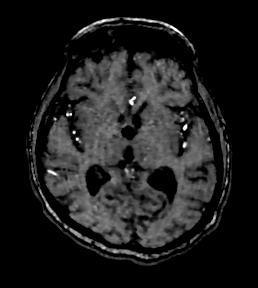
[im 132/168]
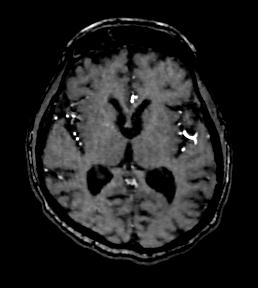
[im 136/168]
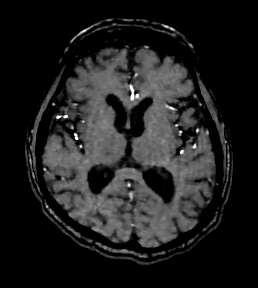
[im 139/168]
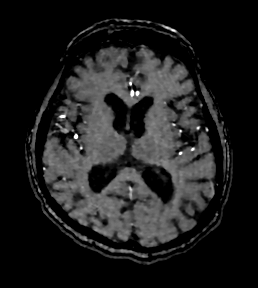
[im 143/168]
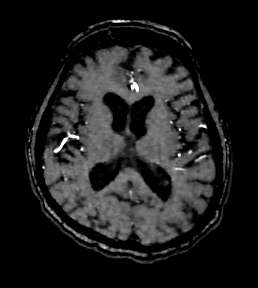
[im 146/168]
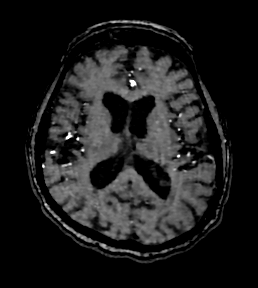
[im 150/168]
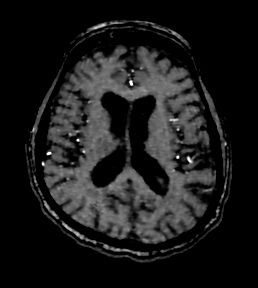
[im 153/168]
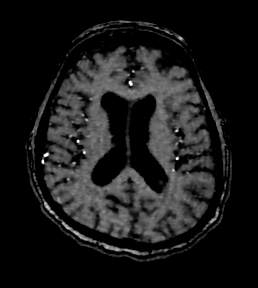
[im 157/168]
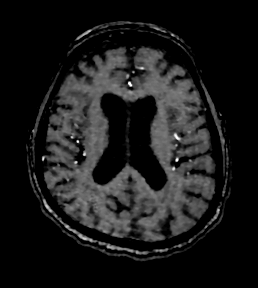
[im 160/168]
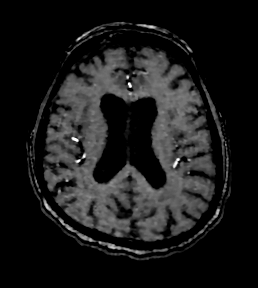
[im 164/168]
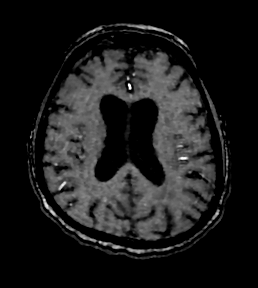
[im 168/168]
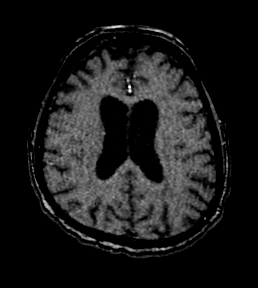

[48 of 48 positions shown; findings below may reference images not displayed]

FINDINGS: MRI HEAD FINDINGS

Brain: There is a small focus of diffusion restriction in the left
centrum semiovale with faint associated FLAIR signal abnormality.
There is a punctate chronic microhemorrhage in the left parietal
lobe. There is additional cortically based T2* hypointensity in the
right frontal lobe near the vertex likely reflecting prior
hemorrhage. There are remote lacunar infarcts in the bilateral
cerebellar hemispheres and basal ganglia.

There is patchy FLAIR signal abnormality throughout the remainder of
the subcortical and periventricular white matter, nonspecific but
likely reflecting sequela of chronic white matter microangiopathy.
There is mild parenchymal volume loss with commensurate enlargement
of the ventricular system. There is no mass lesion. There is no
midline shift.

Vascular: Normal flow voids.

Skull and upper cervical spine: Normal marrow signal.

Sinuses/Orbits: There is mild mucosal thickening throughout the
paranasal sinuses. The globes and orbits are unremarkable.

Other: None.

MRA HEAD FINDINGS

Anterior circulation: The intracranial ICAs are patent. The
bilateral MCAs and ACAs are patent. No significant stenosis,
occlusion, dissection, or aneurysm is identified.

Posterior circulation: The V4 segments of the vertebral arteries are
patent. The right posterior cerebral artery is occluded at the P1/P2
junction. There is severe stenosis of the proximal left P2 segment
measuring approximately 7 mm. The distal left PCA is patent.

Anatomic variants: None.
IMPRESSION: 1. Small acute infarct in the left centrum semiovale.
2. Occluded right PCA at the P1/P2 junction and severe stenosis of
the left P2 segment with distal reconstitution. These findings are
favored to be chronic given lack of ischemia in the occipital lobes.
3. Small remote hemorrhage in the right frontal lobe near the vertex
and small remote lacunar infarcts in the bilateral basal ganglia and
cerebellar hemispheres.
4. Background of parenchymal volume loss and mild chronic white
matter microangiopathy.

## 2020-10-24 MED ORDER — STROKE: EARLY STAGES OF RECOVERY BOOK
Freq: Once | Status: AC
  Filled 2020-10-24: qty 1

## 2020-10-24 MED ORDER — INSULIN ASPART 100 UNIT/ML IJ SOLN
0.0000 [IU] | Freq: Every day | INTRAMUSCULAR | Status: DC
  Administered 2020-10-26: 5 [IU] via SUBCUTANEOUS

## 2020-10-24 MED ORDER — ONDANSETRON HCL 4 MG PO TABS: 4.0000 mg | ORAL_TABLET | Freq: Four times a day (QID) | ORAL | Status: DC | PRN

## 2020-10-24 MED ORDER — METOPROLOL TARTRATE 25 MG PO TABS
25.0000 mg | ORAL_TABLET | Freq: Two times a day (BID) | ORAL | Status: DC
  Administered 2020-10-24 – 2020-10-27 (×6): 25 mg via ORAL
  Filled 2020-10-24 (×6): qty 1

## 2020-10-24 MED ORDER — SODIUM CHLORIDE 0.9 % IV SOLN
100.0000 mL/h | INTRAVENOUS | Status: DC
  Administered 2020-10-24 – 2020-10-25 (×3): 100 mL/h via INTRAVENOUS

## 2020-10-24 MED ORDER — ACETAMINOPHEN 650 MG RE SUPP: 650.0000 mg | Freq: Four times a day (QID) | RECTAL | Status: DC | PRN

## 2020-10-24 MED ORDER — INSULIN GLARGINE-YFGN 100 UNIT/ML ~~LOC~~ SOLN
5.0000 [IU] | Freq: Every day | SUBCUTANEOUS | Status: DC
  Administered 2020-10-24 – 2020-10-26 (×3): 5 [IU] via SUBCUTANEOUS
  Filled 2020-10-24 (×5): qty 0.05

## 2020-10-24 MED ORDER — ZINC SULFATE 220 (50 ZN) MG PO CAPS
220.0000 mg | ORAL_CAPSULE | Freq: Every day | ORAL | Status: DC
  Administered 2020-10-24 – 2020-10-27 (×4): 220 mg via ORAL
  Filled 2020-10-24 (×4): qty 1

## 2020-10-24 MED ORDER — SODIUM CHLORIDE 0.9% FLUSH
3.0000 mL | Freq: Two times a day (BID) | INTRAVENOUS | Status: DC
  Administered 2020-10-24 – 2020-10-27 (×5): 3 mL via INTRAVENOUS

## 2020-10-24 MED ORDER — INSULIN GLARGINE-YFGN 100 UNIT/ML ~~LOC~~ SOLN
SUBCUTANEOUS | Status: AC
  Filled 2020-10-24: qty 10

## 2020-10-24 MED ORDER — ENOXAPARIN SODIUM 40 MG/0.4ML IJ SOSY
40.0000 mg | PREFILLED_SYRINGE | INTRAMUSCULAR | Status: DC
  Administered 2020-10-24 – 2020-10-26 (×3): 40 mg via SUBCUTANEOUS
  Filled 2020-10-24 (×3): qty 0.4

## 2020-10-24 MED ORDER — MECLIZINE HCL 12.5 MG PO TABS: 25.0000 mg | ORAL_TABLET | Freq: Three times a day (TID) | ORAL | Status: DC | PRN

## 2020-10-24 MED ORDER — SODIUM CHLORIDE 0.9% FLUSH: 3.0000 mL | INTRAVENOUS | Status: DC | PRN

## 2020-10-24 MED ORDER — ONDANSETRON HCL 4 MG/2ML IJ SOLN: 4.0000 mg | Freq: Four times a day (QID) | INTRAMUSCULAR | Status: DC | PRN

## 2020-10-24 MED ORDER — ASCORBIC ACID 500 MG PO TABS
500.0000 mg | ORAL_TABLET | Freq: Every day | ORAL | Status: DC
  Administered 2020-10-24 – 2020-10-27 (×4): 500 mg via ORAL
  Filled 2020-10-24 (×4): qty 1

## 2020-10-24 MED ORDER — INSULIN ASPART 100 UNIT/ML IJ SOLN
0.0000 [IU] | Freq: Three times a day (TID) | INTRAMUSCULAR | Status: DC
  Administered 2020-10-25: 5 [IU] via SUBCUTANEOUS
  Administered 2020-10-25 (×2): 2 [IU] via SUBCUTANEOUS
  Administered 2020-10-26: 3 [IU] via SUBCUTANEOUS
  Administered 2020-10-26: 2 [IU] via SUBCUTANEOUS
  Administered 2020-10-26: 5 [IU] via SUBCUTANEOUS
  Administered 2020-10-27: 8 [IU] via SUBCUTANEOUS
  Administered 2020-10-27: 3 [IU] via SUBCUTANEOUS

## 2020-10-24 MED ORDER — SODIUM CHLORIDE 0.9 % IV BOLUS
500.0000 mL | Freq: Once | INTRAVENOUS | Status: AC
  Administered 2020-10-24: 500 mL via INTRAVENOUS

## 2020-10-24 MED ORDER — ATORVASTATIN CALCIUM 40 MG PO TABS
80.0000 mg | ORAL_TABLET | Freq: Every day | ORAL | Status: DC
  Administered 2020-10-24 – 2020-10-26 (×3): 80 mg via ORAL
  Filled 2020-10-24 (×3): qty 2

## 2020-10-24 MED ORDER — SODIUM CHLORIDE 0.9 % IV SOLN: 250.0000 mL | INTRAVENOUS | Status: DC | PRN

## 2020-10-24 MED ORDER — VITAMIN D 25 MCG (1000 UNIT) PO TABS
5000.0000 [IU] | ORAL_TABLET | Freq: Every day | ORAL | Status: DC
  Administered 2020-10-24 – 2020-10-27 (×4): 5000 [IU] via ORAL
  Filled 2020-10-24 (×4): qty 5

## 2020-10-24 MED ORDER — ASPIRIN 325 MG PO TABS
325.0000 mg | ORAL_TABLET | Freq: Every day | ORAL | Status: DC
  Administered 2020-10-24 – 2020-10-27 (×4): 325 mg via ORAL
  Filled 2020-10-24 (×4): qty 1

## 2020-10-24 MED ORDER — ACETAMINOPHEN 325 MG PO TABS: 650.0000 mg | ORAL_TABLET | Freq: Four times a day (QID) | ORAL | Status: DC | PRN

## 2020-10-24 NOTE — H&P (Addendum)
History and Physical    Darrell [name] NUU:[id number] DOB: 1950/04/24 DOA: 10/24/2020  PCP: Celene Squibb, MD   Patient coming from: Home  I have personally briefly reviewed patient's old medical records in [place]  Chief Complaint: Right-sided weakness and ataxia.  HPI: Darrell [name] is a 70 y.o. male with medical history significant of hypertension, hyperlipidemia, gastroesophageal reflux disease, type 2 diabetes mellitus, history of coronary disease/STEMI (s/p PCI and drug-eluting stent to LAD) and prior history of TIA/lacunar infarct; who presented to the emergency department secondary to right-sided weakness and ataxia.  Patient reports symptoms started around 10 PM mild vague on 10/23/2020 (last seen normal); and a progressive throughout and worsen when he woke up on the day of admission.  Patient expressed experiencing a mechanical fall while losing balance when standing and decided to seek medical attention at that time.  He denies any fever, chills, cough, hematuria, dysuria, hematochezia, melena, abdominal pain, headaches or any other focal deficits.  Patient also expressed no chest pain or shortness of breath.  Of note, patient is now vaccinated against COVID, COVID PCR in the ED negative.  Reported no sick contacts  ED Course: Out of tPA window at time of presentation; last seen normal around 10 PM on 10/23/2020.  MRI has been performed demonstrating acute/subacute small infarct in his right semiovale region.  Case discussed with neurology service who recommended admission to complete a stroke work-up.  Patient passed swallowing evaluation while in the ED.  Review of Systems: As per HPI otherwise all other systems reviewed and are negative.   Past Medical History:  Diagnosis Date   Aortic stenosis 05/2011   Severe by recent echo; AoV area ~0.77-79 cm2, peak /mean gradient 86 mmHg/ 55 mmHg.   Arthritis    back- low- [facility]- workmen's comp. situation    DDD  (degenerative disc disease), lumbosacral    Depression    quick temper- per pt.    Diverticulosis    slight -per colonoscopy, pt. asymptomatic- thus far   GERD (gastroesophageal reflux disease)    Hiatal hernia    History of BPH    History of MRSA infection    Hyperlipidemia    Hypertension    [facility]- Dr. Ellyn Hack    Insomnia    Myocardial infarction [facility])    By report only, cardiac catheterization was negative for ischemia.   NIDDM (non-insulin dependent diabetes mellitus)    Type 2 NIDDM x 5 years   Prostate disease    Recurrent upper respiratory infection (URI)    cold- curently- "per pt., on the mend"   S/P AVR (aortic valve replacement) 08/05/2011   Preop cath showed normal coronaries; AVR--Dr. Servando Snare, MD; 23 mm Magna Ease bioprosthetic Aortic Valve   STEMI (ST elevation myocardial infarction) ([place])    PCI/DES x1 to mLAD    Past Surgical History:  Procedure Laterality Date   ABDOMINAL EXPOSURE N/A 04/01/2012   Procedure: ABDOMINAL EXPOSURE;  Surgeon: Angelia Mould, MD;  Location: [place];  Service: Vascular;  Laterality: N/A;   ANTERIOR LUMBAR FUSION N/A 04/01/2012   Procedure: ANTERIOR LUMBAR FUSION 1 LEVEL;  Surgeon: [name], MD;  Location: [place];  Service: Orthopedics;  Laterality: N/A;  ALIF L5-S1   AORTIC VALVE REPLACEMENT  08/05/2011   Procedure: AORTIC VALVE REPLACEMENT (AVR);  Surgeon: [name], MD;  Location: [place];  Service: Open Heart Surgery;  Laterality: N/A;   CARDIAC CATHETERIZATION  06/2011    Minimal  coronary disease   CORONARY/GRAFT ACUTE MI REVASCULARIZATION N/A 04/02/2019   Procedure: Coronary/Graft Acute MI Revascularization;  Surgeon: [name], MD;  Location: [place] CV LAB;  Service: Cardiovascular;  Laterality: N/A;   LEFT AND RIGHT HEART CATHETERIZATION WITH CORONARY ANGIOGRAM  07/03/2011   Procedure: LEFT AND RIGHT HEART CATHETERIZATION WITH CORONARY ANGIOGRAM;  Surgeon: [name], MD;  Location: [place] CATH LAB;  Service:  Cardiovascular;;   LEFT HEART CATH AND CORONARY ANGIOGRAPHY N/A 04/02/2019   Procedure: LEFT HEART CATH AND CORONARY ANGIOGRAPHY;  Surgeon: [name], MD;  Location: [place] CV LAB;  Service: Cardiovascular;  Laterality: N/A;   LUMBAR FUSION  04/02/2012   Dr [name]   NASAL SEPTUM SURGERY     [facility] 10 [facility] ago   NM MYOVIEW LTD  April 2013   Low risk.    Social History  reports that he has never smoked. He has never used smokeless tobacco. He reports that he does not drink alcohol and does not use drugs.  Allergies  Allergen Reactions   Tape Rash    Family History  Problem Relation Age of Onset   Heart disease Mother    Heart attack Brother    Anesthesia problems Neg Hx     Prior to Admission medications   Medication Sig Start Date End Date Taking? Authorizing Provider  acetaminophen (TYLENOL) 500 MG tablet Take 1,000 mg by mouth every 6 (six) hours as needed for headache (pain).    [provider]  ascorbic acid (VITAMIN C) 500 MG tablet Take 500 mg by mouth daily.    [provider]  aspirin EC 81 MG EC tablet Take 1 tablet (81 mg total) by mouth daily. 04/05/19   Cheryln Manly, NP  atorvastatin (LIPITOR) 80 MG tablet Take 1 tablet (80 mg total) by mouth daily. 07/28/19   [name], MD  Blood Glucose Calibration (ACCU-CHEK AVIVA) SOLN Check BS bid DX: E11.9 04/18/19   [name], MD  Blood Glucose Monitoring Suppl (ACCU-CHEK AVIVA PLUS) w/Device KIT Check BS bid DX: E11.9 04/18/19   [name], MD  BRILINTA 90 MG TABS tablet TAKE 1 TABLET BY MOUTH TWICE DAILY 05/07/20   Leonie Man, MD  Cholecalciferol (VITAMIN D3) 125 MCG (5000 UT) TABS Take 5,000 Units by mouth daily.    [provider]  glipiZIDE (GLUCOTROL XL) 5 MG 24 hr tablet TAKE 1 TABLET BY MOUTH DAILY WITH BREAKFAST 05/07/20   [name], MD  glucose blood (ACCU-CHEK AVIVA PLUS) test strip Check BS bid DX: E11.9 04/18/19   [name],  MD  JARDIANCE 25 MG TABS tablet TAKE 1 TABLET BY MOUTH BEFORE breakfast 01/11/20   [name], MD  Lancets (ACCU-CHEK MULTICLIX) lancets Check BS bid DX: E11.9 04/18/19   [name], MD  Lancets Misc. (ACCU-CHEK MULTICLIX LANCET DEV) KIT Check BS bid DX: E11.9 04/18/19   [name], MD  meclizine (ANTIVERT) 25 MG tablet Take 1 tablet (25 mg total) by mouth 3 (three) times daily as needed for dizziness. 08/15/20   Fransico Meadow, PA-C  metFORMIN (GLUCOPHAGE-XR) 500 MG 24 hr tablet TAKE 4 TABLETS BY MOUTH EVERY DAY WITH BREAKFAST Patient taking differently: Take 1,000 mg by mouth in the morning and at bedtime. 02/14/20   [name], MD  metoprolol tartrate (LOPRESSOR) 25 MG tablet Take 1 tablet (25 mg total) by mouth 2 (two) times daily. 07/26/19   [name],  MD  nitroGLYCERIN (NITROSTAT) 0.4 MG SL tablet Place 1 tablet (0.4 mg total) under the tongue every 5 (five) minutes x 3 doses as needed for chest pain. 04/04/19   Cheryln Manly, NP  VITAMIN E PO Take 1 capsule by mouth daily.    [provider]  Zinc 50 MG TABS Take 50 mg by mouth daily.    [provider]    Physical Exam: Vitals:   10/24/20 1230 10/24/20 1300 10/24/20 1330 10/24/20 1400  BP: (!) 157/75 (!) 168/94 (!) 171/79 (!) 162/80  Pulse: 71 70 79 73  Resp: _0 Temp:      TempSrc:      SpO2: 99% 98% 98% 98%    Constitutional: NAD, calm, comfortable; asking for something to eat.  Denies chest pain or shortness of breath. Vitals:   10/24/20 1230 10/24/20 1300 10/24/20 1330 10/24/20 1400  BP: (!) 157/75 (!) 168/94 (!) 171/79 (!) 162/80  Pulse: 71 70 79 73  Resp: _1 Temp:      TempSrc:      SpO2: 99% 98% 98% 98%   Eyes: PERRL, lids and conjunctivae normal; no icterus or nystagmus appreciated on exam. ENMT: Mucous membranes are moist. Posterior pharynx clear of any exudate or lesions. Neck: normal, supple, no masses, no thyromegaly. Respiratory: clear to  auscultation bilaterally, no wheezing, no crackles. Normal respiratory effort. No accessory muscle use.  Cardiovascular: Regular rate and rhythm, no rubs, no gallops, no JVD. Abdomen: no tenderness, no masses palpated. No hepatosplenomegaly. Bowel sounds positive.  Musculoskeletal: no clubbing / cyanosis. No joint deformity upper and lower extremities. Good ROM, no contractures. Normal muscle tone.  Skin: no petechiae. Neurologic: CN 2-12 grossly intact.  Muscle strength demonstrating right lower extremity 4 out of 5 in comparison to the rest of his limbs; no dysarthria, positive dysmetria reports feeling of balance when standing without support; no other focal deficits seen or reported. Psychiatric: Normal judgment and insight. Alert and oriented x 3. Normal mood.    Labs on Admission: I have personally reviewed following labs and imaging studies  CBC: Recent Labs  Lab 10/24/20 0957 10/24/20 1019  WBC 6.8  --   NEUTROABS 4.1  --   HGB 16.1 16.3  HCT 46.4 48.0  MCV 96.7  --   PLT 133*  --     Basic Metabolic Panel: Recent Labs  Lab 10/24/20 0957 10/24/20 1019  NA 136 140  K 3.9 4.2  CL 106 105  CO2 23  --   GLUCOSE 187* 182*  BUN 17 17  CREATININE 0.73 0.80  CALCIUM 8.8*  --     GFR: CrCl cannot be calculated (Unknown ideal weight.).  Liver Function Tests: Recent Labs  Lab 10/24/20 0957  AST 24  ALT 42  ALKPHOS 56  BILITOT 0.7  PROT 6.7  ALBUMIN 3.9    Urine analysis:    Component Value Date/Time   COLORURINE YELLOW 10/24/2020 1038   APPEARANCEUR CLEAR 10/24/2020 1038   LABSPEC 1.029 10/24/2020 1038   PHURINE 6.0 10/24/2020 1038   GLUCOSEU >=500 (A) 10/24/2020 1038   HGBUR NEGATIVE 10/24/2020 1038   BILIRUBINUR NEGATIVE 10/24/2020 1038   KETONESUR 20 (A) 10/24/2020 1038   PROTEINUR NEGATIVE 10/24/2020 1038   UROBILINOGEN 0.2 01/09/2014 1159   NITRITE NEGATIVE 10/24/2020 1038   LEUKOCYTESUR NEGATIVE 10/24/2020 1038    Radiological Exams on  Admission: MR ANGIO HEAD WO CONTRAST  Result Date: 10/24/2020 CLINICAL DATA:  Gait difficulty, history of CVA EXAM: MRI HEAD WITHOUT CONTRAST MRA HEAD WITHOUT CONTRAST TECHNIQUE: Multiplanar, multi-echo pulse sequences of the brain and surrounding structures were acquired without intravenous contrast. Angiographic images of the Circle of Willis were acquired using MRA technique without intravenous contrast. COMPARISON:  No pertinent prior exam. CT head 09/14/2020 FINDINGS: MRI HEAD FINDINGS Brain: There is a small focus of diffusion restriction in the left centrum semiovale with faint associated FLAIR signal abnormality. There is a punctate chronic microhemorrhage in the left parietal lobe. There is additional cortically based T2* hypointensity in the right frontal lobe near the vertex likely reflecting prior hemorrhage. There are remote lacunar infarcts in the bilateral cerebellar hemispheres and basal ganglia. There is patchy FLAIR signal abnormality throughout the remainder of the subcortical and periventricular white matter, nonspecific but likely reflecting sequela of chronic white matter microangiopathy. There is mild parenchymal volume loss with commensurate enlargement of the ventricular system. There is no mass lesion. There is no midline shift. Vascular: Normal flow voids. Skull and upper cervical spine: Normal marrow signal. Sinuses/Orbits: There is mild mucosal thickening throughout the paranasal sinuses. The globes and orbits are unremarkable. Other: None. MRA HEAD FINDINGS Anterior circulation: The intracranial ICAs are patent. The bilateral MCAs and ACAs are patent. No significant stenosis, occlusion, dissection, or aneurysm is identified. Posterior circulation: The V4 segments of the vertebral arteries are patent. The right posterior cerebral artery is occluded at the P1/P2 junction. There is severe stenosis of the proximal left P2 segment measuring approximately 7 mm. The distal left PCA is  patent. Anatomic variants: None. IMPRESSION: 1. Small acute infarct in the left centrum semiovale. 2. Occluded right PCA at the P1/P2 junction and severe stenosis of the left P2 segment with distal reconstitution. These findings are favored to be chronic given lack of ischemia in the occipital lobes. 3. Small remote hemorrhage in the right frontal lobe near the vertex and small remote lacunar infarcts in the bilateral basal ganglia and cerebellar hemispheres. 4. Background of parenchymal volume loss and mild chronic white matter microangiopathy. Electronically Signed   By: Valetta Mole M.D.   On: 10/24/2020 11:35   MR BRAIN WO CONTRAST  Result Date: 10/24/2020 CLINICAL DATA:  Gait difficulty, history of CVA EXAM: MRI HEAD WITHOUT CONTRAST MRA HEAD WITHOUT CONTRAST TECHNIQUE: Multiplanar, multi-echo pulse sequences of the brain and surrounding structures were acquired without intravenous contrast. Angiographic images of the Circle of Willis were acquired using MRA technique without intravenous contrast. COMPARISON:  No pertinent prior exam. CT head 09/14/2020 FINDINGS: MRI HEAD FINDINGS Brain: There is a small focus of diffusion restriction in the left centrum semiovale with faint associated FLAIR signal abnormality. There is a punctate chronic microhemorrhage in the left parietal lobe. There is additional cortically based T2* hypointensity in the right frontal lobe near the vertex likely reflecting prior hemorrhage. There are remote lacunar infarcts in the bilateral cerebellar hemispheres and basal ganglia. There is patchy FLAIR signal abnormality throughout the remainder of the subcortical and periventricular white matter, nonspecific but likely reflecting sequela of chronic white matter microangiopathy. There is mild parenchymal volume loss with commensurate enlargement of the ventricular system. There is no mass lesion. There is no midline shift. Vascular: Normal flow voids. Skull and upper cervical spine:  Normal marrow signal. Sinuses/Orbits: There is mild mucosal thickening throughout the paranasal sinuses. The globes and orbits are unremarkable. Other: None. MRA HEAD FINDINGS Anterior circulation: The intracranial ICAs are patent. The bilateral MCAs and ACAs are patent. No significant  stenosis, occlusion, dissection, or aneurysm is identified. Posterior circulation: The V4 segments of the vertebral arteries are patent. The right posterior cerebral artery is occluded at the P1/P2 junction. There is severe stenosis of the proximal left P2 segment measuring approximately 7 mm. The distal left PCA is patent. Anatomic variants: None. IMPRESSION: 1. Small acute infarct in the left centrum semiovale. 2. Occluded right PCA at the P1/P2 junction and severe stenosis of the left P2 segment with distal reconstitution. These findings are favored to be chronic given lack of ischemia in the occipital lobes. 3. Small remote hemorrhage in the right frontal lobe near the vertex and small remote lacunar infarcts in the bilateral basal ganglia and cerebellar hemispheres. 4. Background of parenchymal volume loss and mild chronic white matter microangiopathy. Electronically Signed   By: Valetta Mole M.D.   On: 10/24/2020 11:35    EKG: Independently reviewed.  Sinus rhythm, no ischemic changes.  Assessment/Plan 1-right-sided weakness, dizziness/ataxia; stroke-like symptoms -Patient risk factors include hypertension, hyperlipidemia, diabetes and age; also with prior history of TIA and lacunar infarcts. -Presented with right-sided weakness, dizziness and ataxia; patient reported symptoms started around 10 PM on 10/23/2020 and worsened this morning when getting out of bed; patient reports associated mechanical fall landing on her right side after losing balance.  No fractures or head injury. -Previously on aspirin and Brilinta; but expressed that he has been taken off Brilinta for couple of months now. -No difficulty swallowing, no  dysarthria, no dysuria, no hematuria, no melena, hematochezia still with 4 out of 5 right lower extremity weakness appreciated on examination and feeling poor balance. -MRI demonstrating left acute/subacute semiovale infarct, along with presentation of small remote lacunar and bilateral basal ganglia strokes. -Neurology consulted with recommendations to complete a stroke work-up (lipid panel, A1c, echocardiogram, carotid Dopplers, TSH and telemetry monitoring) -Will discuss about the need of dual antiplatelet therapy, started on aspirin and statins currently. -Will allow for permissive hypertension -Follow any further recommendations by neurology. -PT, OT and speech therapy has been consulted. -As needed meclizine ordered.  2-hypertension -Resume some of his antihypertensive agents while allowing for permissive hypertension.  3-type 2 diabetes mellitus -Will check A1c -Holding oral hypoglycemic agents on admission -Started on sliding scale insulin and Levemir. -Follow CBGs  4-hyperlipidemia -Continue statins.  5-history of coronary artery disease -Aspirin, statins and beta-blocker -Denies chest pain or shortness of breath -Continue outpatient follow-up with cardiology service.  6-vitamin D deficiency -Recheck vitamin D level -Continue daily supplementation.   DVT prophylaxis: Lovenox Code Status:   Full code Family Communication:  Sister at bedside. Disposition Plan:   Patient is from:  Home   Anticipated DC to:  Home  Anticipated DC date:  10/25/20  Anticipated DC barriers: Stroke work-up completion.  Consults called:  Dr. Merlene Laughter (neurology) Admission status: Observation, telemetry bed, length of stay less than  Severity of Illness: The appropriate patient status for this patient is OBSERVATION. Observation status is judged to be reasonable and necessary in order to provide the required intensity of service to ensure the patient's safety. The patient's presenting symptoms,  physical exam findings, and initial radiographic and laboratory data in the context of their medical condition is felt to place them at decreased risk for further clinical deterioration. Furthermore, it is anticipated that the patient will be medically stable for discharge from the hospital within 2 midnights of admission. The following factors support the patient status of observation.   " The patient's presenting symptoms include right sided weakness and  dizziness/ataxia. " The physical exam findings include 4/5 MS on his Right leg and reports of poor balance.. " The initial radiographic and laboratory data are positive small acute left semiovale infarct. Needing admission for acute/subacute stroke work-up; patient out of tPA window.     Barton Dubois MD [facility]  How to contact the [facility] Attending or Consulting provider [place] or covering provider during after hours [place], for this patient?   Check the care team in [facility] and look for a) attending/consulting [facility] provider listed and b) the [facility] team listed Log into [email or web address] and use [place]'s universal password to access. If you do not have the password, please contact the hospital operator. Locate the [facility] provider you are looking for under [facility] and page to a number that you can be directly reached. If you still have difficulty reaching the provider, please page the [facility] (Director on Call) for the Hospitalists listed on amion for assistance.  10/24/2020, 4:06 PM

## 2020-10-24 NOTE — ED Triage Notes (Signed)
Pt reports when he got up to go to the bathroom around 0300-0400 this morning his legs gave out on him and he fell and landed on his RT side.  States he started feeling weak around 10 pm last night.  Also reports dizziness that has continued to get worse over the past month.  Pt able to ambulate to bed from w/c.

## 2020-10-24 NOTE — ED Provider Notes (Signed)
[facility] EMERGENCY DEPARTMENT Provider Note   CSN: [id number] Arrival date & time: 10/24/20  2683     History Chief Complaint  Patient presents with   Weakness    Darrell [name] is a 70 y.o. male.  HPI Adult male with history of multiple prior stroke presents with new gait difficulty, lower extremity weakness.  He notes that he has been feeling dizzy for about 1 month, but at some point over the past half day developed sensation of weakness in his lower extremities with difficulty with ambulation.  No upper extremity weakness, difficulty with speech, no facial droop.  No clear precipitant, and since onset symptoms have been persistent, with patient describing difficulty attempting to ambulate, but without falls. He seemingly is taking his medication as directed.    Past Medical History:  Diagnosis Date   Aortic stenosis 05/2011   Severe by recent echo; AoV area ~0.77-79 cm2, peak /mean gradient 86 mmHg/ 55 mmHg.   Arthritis    back- low- [facility]- workmen's comp. situation    DDD (degenerative disc disease), lumbosacral    Depression    quick temper- per pt.    Diverticulosis    slight -per colonoscopy, pt. asymptomatic- thus far   GERD (gastroesophageal reflux disease)    Hiatal hernia    History of BPH    History of MRSA infection    Hyperlipidemia    Hypertension    [facility]- Dr. Ellyn Hack    Insomnia    Myocardial infarction [facility])    By report only, cardiac catheterization was negative for ischemia.   NIDDM (non-insulin dependent diabetes mellitus)    Type 2 NIDDM x 5 years   Prostate disease    Recurrent upper respiratory infection (URI)    cold- curently- "per pt., on the mend"   S/P AVR (aortic valve replacement) 08/05/2011   Preop cath showed normal coronaries; AVR--Dr. Servando Snare, MD; 23 mm Magna Ease bioprosthetic Aortic Valve   STEMI (ST elevation myocardial infarction) ([place])    PCI/DES x1 to mLAD    Patient Active Problem List   Diagnosis Date Noted    Stroke-like symptoms 10/24/2020   Bilateral inguinal hernia without obstruction or gangrene 03/27/2020   Hyperlipidemia associated with type 2 diabetes mellitus ([place])    Diabetes mellitus due to underlying condition, uncontrolled, with hypoglycemia without coma ([place])    Acute ST elevation myocardial infarction (STEMI) involving left anterior descending (LAD) coronary artery ([place]) 04/02/2019   ST elevation myocardial infarction involving left anterior descending (LAD) coronary artery ([place]) 04/02/2019   Vitamin D deficiency 03/13/2015   DDD (degenerative disc disease), lumbar 04/01/2012   Degenerative disc disease, lumbar 03/24/2012   MRSA cellulitis 10/20/2011   S/P AVR (aortic valve replacement) - for severe AS;  08/05/2011   GERD (gastroesophageal reflux disease)    Insomnia    Type 2 diabetes mellitus ([place])     Class: Diagnosis of   History of BPH    ADENOMATOUS COLONIC POLYP 07/08/2007   HYPERLIPIDEMIA 07/08/2007    Class: Diagnosis of   DEPRESSION 07/08/2007   Essential hypertension 07/08/2007    Class: Diagnosis of   DIVERTICULOSIS 07/08/2007   ABDOMINAL PAIN-LUQ 07/08/2007    Past Surgical History:  Procedure Laterality Date   ABDOMINAL EXPOSURE N/A 04/01/2012   Procedure: ABDOMINAL EXPOSURE;  Surgeon: Angelia Mould, MD;  Location: [place];  Service: Vascular;  Laterality: N/A;   ANTERIOR LUMBAR FUSION N/A 04/01/2012   Procedure: ANTERIOR LUMBAR FUSION 1 LEVEL;  Surgeon: Dellis Filbert  Windy Kalata, MD;  Location: [place];  Service: Orthopedics;  Laterality: N/A;  ALIF L5-S1   AORTIC VALVE REPLACEMENT  08/05/2011   Procedure: AORTIC VALVE REPLACEMENT (AVR);  Surgeon: [name], MD;  Location: [place];  Service: Open Heart Surgery;  Laterality: N/A;   CARDIAC CATHETERIZATION  06/2011    Minimal coronary disease   CORONARY/GRAFT ACUTE MI REVASCULARIZATION N/A 04/02/2019   Procedure: Coronary/Graft Acute MI Revascularization;  Surgeon: [name], MD;  Location: [place] CV  LAB;  Service: Cardiovascular;  Laterality: N/A;   LEFT AND RIGHT HEART CATHETERIZATION WITH CORONARY ANGIOGRAM  07/03/2011   Procedure: LEFT AND RIGHT HEART CATHETERIZATION WITH CORONARY ANGIOGRAM;  Surgeon: [name], MD;  Location: [place] CATH LAB;  Service: Cardiovascular;;   LEFT HEART CATH AND CORONARY ANGIOGRAPHY N/A 04/02/2019   Procedure: LEFT HEART CATH AND CORONARY ANGIOGRAPHY;  Surgeon: [name], MD;  Location: [place] CV LAB;  Service: Cardiovascular;  Laterality: N/A;   LUMBAR FUSION  04/02/2012   Dr [name]   NASAL SEPTUM SURGERY     [facility] 10 yrs. ago   NM MYOVIEW LTD  April 2013   Low risk.       Family History  Problem Relation Age of Onset   Heart disease Mother    Heart attack Brother    Anesthesia problems Neg Hx     Social History   Tobacco Use   Smoking status: Never   Smokeless tobacco: Never  Substance Use Topics   Alcohol use: No   Drug use: No    Home Medications Prior to Admission medications   Medication Sig Start Date End Date Taking? Authorizing Provider  acetaminophen (TYLENOL) 500 MG tablet Take 1,000 mg by mouth every 6 (six) hours as needed for headache (pain).    [provider]  ascorbic acid (VITAMIN C) 500 MG tablet Take 500 mg by mouth daily.    [provider]  aspirin EC 81 MG EC tablet Take 1 tablet (81 mg total) by mouth daily. 04/05/19   Cheryln Manly, NP  atorvastatin (LIPITOR) 80 MG tablet Take 1 tablet (80 mg total) by mouth daily. 07/28/19   [name], MD  Blood Glucose Calibration (ACCU-CHEK AVIVA) SOLN Check BS bid DX: E11.9 04/18/19   [name], MD  Blood Glucose Monitoring Suppl (ACCU-CHEK AVIVA PLUS) w/Device KIT Check BS bid DX: E11.9 04/18/19   [name], MD  BRILINTA 90 MG TABS tablet TAKE 1 TABLET BY MOUTH TWICE DAILY 05/07/20   Leonie Man, MD  Cholecalciferol (VITAMIN D3) 125 MCG (5000 UT) TABS Take 5,000 Units by mouth daily.    [provider]  glipiZIDE (GLUCOTROL XL) 5 MG 24 hr tablet TAKE 1 TABLET BY MOUTH DAILY WITH BREAKFAST 05/07/20   [name], MD  glucose blood (ACCU-CHEK AVIVA PLUS) test strip Check BS bid DX: E11.9 04/18/19   [name], MD  JARDIANCE 25 MG TABS tablet TAKE 1 TABLET BY MOUTH BEFORE breakfast 01/11/20   [name], MD  Lancets (ACCU-CHEK MULTICLIX) lancets Check BS bid DX: E11.9 04/18/19   [name], MD  Lancets Misc. (ACCU-CHEK MULTICLIX LANCET DEV) KIT Check BS bid DX: E11.9 04/18/19   [name], MD  meclizine (ANTIVERT) 25 MG tablet Take 1 tablet (25 mg total) by mouth 3 (three) times daily as needed for dizziness. 08/15/20   Fransico Meadow, PA-C  metFORMIN (GLUCOPHAGE-XR) 500 MG 24 hr  tablet TAKE 4 TABLETS BY MOUTH EVERY DAY WITH BREAKFAST Patient taking differently: Take 1,000 mg by mouth in the morning and at bedtime. 02/14/20   [name], MD  metoprolol tartrate (LOPRESSOR) 25 MG tablet Take 1 tablet (25 mg total) by mouth 2 (two) times daily. 07/26/19   [name], MD  nitroGLYCERIN (NITROSTAT) 0.4 MG SL tablet Place 1 tablet (0.4 mg total) under the tongue every 5 (five) minutes x 3 doses as needed for chest pain. 04/04/19   Cheryln Manly, NP  VITAMIN E PO Take 1 capsule by mouth daily.    [provider]  Zinc 50 MG TABS Take 50 mg by mouth daily.    [provider]    Allergies    Tape  Review of Systems   Review of Systems  Constitutional:        Per HPI, otherwise negative  HENT:         Per HPI, otherwise negative  Respiratory:         Per HPI, otherwise negative  Cardiovascular:        Per HPI, otherwise negative  Gastrointestinal:  Negative for vomiting.  Endocrine:       Negative aside from HPI  Genitourinary:        Neg aside from HPI   Musculoskeletal:        Per HPI, otherwise negative  Skin: Negative.   Neurological:  Positive for weakness. Negative for syncope.   Physical Exam Updated Vital Signs BP  (!) 162/80   Pulse 73   Temp 98.1 F (36.7 C) (Oral)   Resp 16   SpO2 98%   Physical Exam Vitals and nursing note reviewed.  Constitutional:      General: He is not in acute distress.    Appearance: He is well-developed.  HENT:     Head: Normocephalic and atraumatic.  Eyes:     Conjunctiva/sclera: Conjunctivae normal.  Cardiovascular:     Rate and Rhythm: Normal rate and regular rhythm.  Pulmonary:     Effort: Pulmonary effort is normal. No respiratory distress.     Breath sounds: No stridor.  Abdominal:     General: There is no distension.  Skin:    General: Skin is warm and dry.  Neurological:     Mental Status: He is alert and oriented to person, place, and time.     Cranial Nerves: Cranial nerves are intact.     Motor: Atrophy present. No tremor.     Comments: Lower extremity strength 5/5 bilateral, symmetric, proximal and distal.  Patient can bear weight, but has difficulty with stability when attempting to ambulate.    ED Results / Procedures / Treatments   Labs (all labs ordered are listed, but only abnormal results are displayed) Labs Reviewed  CBC - Abnormal; Notable for the following components:      Result Value   Platelets 133 (*)    All other components within normal limits  COMPREHENSIVE METABOLIC PANEL - Abnormal; Notable for the following components:   Glucose, Bld 187 (*)    Calcium 8.8 (*)    All other components within normal limits  URINALYSIS, ROUTINE W REFLEX MICROSCOPIC - Abnormal; Notable for the following components:   Glucose, UA >=500 (*)    Ketones, ur 20 (*)    All other components within normal limits  I-STAT CHEM 8, ED - Abnormal; Notable for the following components:   Glucose, Bld 182 (*)  All other components within normal limits  RESP PANEL BY RT-PCR (FLU A&B, COVID) [id number]  PROTIME-INR  APTT  DIFFERENTIAL  RAPID URINE DRUG SCREEN, HOSP PERFORMED    EKG Rate 77, sinus rhythm, wander, nonspecific intraventricular  conduction delay, T wave abnormality, artifact, abnormal   Radiology MR ANGIO HEAD WO CONTRAST  Result Date: 10/24/2020 CLINICAL DATA:  Gait difficulty, history of CVA EXAM: MRI HEAD WITHOUT CONTRAST MRA HEAD WITHOUT CONTRAST TECHNIQUE: Multiplanar, multi-echo pulse sequences of the brain and surrounding structures were acquired without intravenous contrast. Angiographic images of the Circle of Willis were acquired using MRA technique without intravenous contrast. COMPARISON:  No pertinent prior exam. CT head 09/14/2020 FINDINGS: MRI HEAD FINDINGS Brain: There is a small focus of diffusion restriction in the left centrum semiovale with faint associated FLAIR signal abnormality. There is a punctate chronic microhemorrhage in the left parietal lobe. There is additional cortically based T2* hypointensity in the right frontal lobe near the vertex likely reflecting prior hemorrhage. There are remote lacunar infarcts in the bilateral cerebellar hemispheres and basal ganglia. There is patchy FLAIR signal abnormality throughout the remainder of the subcortical and periventricular white matter, nonspecific but likely reflecting sequela of chronic white matter microangiopathy. There is mild parenchymal volume loss with commensurate enlargement of the ventricular system. There is no mass lesion. There is no midline shift. Vascular: Normal flow voids. Skull and upper cervical spine: Normal marrow signal. Sinuses/Orbits: There is mild mucosal thickening throughout the paranasal sinuses. The globes and orbits are unremarkable. Other: None. MRA HEAD FINDINGS Anterior circulation: The intracranial ICAs are patent. The bilateral MCAs and ACAs are patent. No significant stenosis, occlusion, dissection, or aneurysm is identified. Posterior circulation: The V4 segments of the vertebral arteries are patent. The right posterior cerebral artery is occluded at the P1/P2 junction. There is severe stenosis of the proximal left P2  segment measuring approximately 7 mm. The distal left PCA is patent. Anatomic variants: None. IMPRESSION: 1. Small acute infarct in the left centrum semiovale. 2. Occluded right PCA at the P1/P2 junction and severe stenosis of the left P2 segment with distal reconstitution. These findings are favored to be chronic given lack of ischemia in the occipital lobes. 3. Small remote hemorrhage in the right frontal lobe near the vertex and small remote lacunar infarcts in the bilateral basal ganglia and cerebellar hemispheres. 4. Background of parenchymal volume loss and mild chronic white matter microangiopathy. Electronically Signed   By: Valetta Mole M.D.   On: 10/24/2020 11:35   MR BRAIN WO CONTRAST  Result Date: 10/24/2020 CLINICAL DATA:  Gait difficulty, history of CVA EXAM: MRI HEAD WITHOUT CONTRAST MRA HEAD WITHOUT CONTRAST TECHNIQUE: Multiplanar, multi-echo pulse sequences of the brain and surrounding structures were acquired without intravenous contrast. Angiographic images of the Circle of Willis were acquired using MRA technique without intravenous contrast. COMPARISON:  No pertinent prior exam. CT head 09/14/2020 FINDINGS: MRI HEAD FINDINGS Brain: There is a small focus of diffusion restriction in the left centrum semiovale with faint associated FLAIR signal abnormality. There is a punctate chronic microhemorrhage in the left parietal lobe. There is additional cortically based T2* hypointensity in the right frontal lobe near the vertex likely reflecting prior hemorrhage. There are remote lacunar infarcts in the bilateral cerebellar hemispheres and basal ganglia. There is patchy FLAIR signal abnormality throughout the remainder of the subcortical and periventricular white matter, nonspecific but likely reflecting sequela of chronic white matter microangiopathy. There is mild parenchymal volume loss with commensurate enlargement of the  ventricular system. There is no mass lesion. There is no midline shift.  Vascular: Normal flow voids. Skull and upper cervical spine: Normal marrow signal. Sinuses/Orbits: There is mild mucosal thickening throughout the paranasal sinuses. The globes and orbits are unremarkable. Other: None. MRA HEAD FINDINGS Anterior circulation: The intracranial ICAs are patent. The bilateral MCAs and ACAs are patent. No significant stenosis, occlusion, dissection, or aneurysm is identified. Posterior circulation: The V4 segments of the vertebral arteries are patent. The right posterior cerebral artery is occluded at the P1/P2 junction. There is severe stenosis of the proximal left P2 segment measuring approximately 7 mm. The distal left PCA is patent. Anatomic variants: None. IMPRESSION: 1. Small acute infarct in the left centrum semiovale. 2. Occluded right PCA at the P1/P2 junction and severe stenosis of the left P2 segment with distal reconstitution. These findings are favored to be chronic given lack of ischemia in the occipital lobes. 3. Small remote hemorrhage in the right frontal lobe near the vertex and small remote lacunar infarcts in the bilateral basal ganglia and cerebellar hemispheres. 4. Background of parenchymal volume loss and mild chronic white matter microangiopathy. Electronically Signed   By: Valetta Mole M.D.   On: 10/24/2020 11:35    Procedures Procedures   Medications Ordered in ED Medications  sodium chloride 0.9 % bolus 500 mL (0 mLs Intravenous Stopped 10/24/20 1129)    Followed by  0.9 %  sodium chloride infusion (100 mL/hr Intravenous New Bag/Given 10/24/20 1128)  enoxaparin (LOVENOX) injection 40 mg (has no administration in time range)    ED Course  I have reviewed the triage vital signs and the nursing notes.  Pertinent labs & imaging results that were available during my care of the patient were reviewed by me and considered in my medical decision making (see chart for details).  Update: I discussed patient's case with our neurology colleagues.  Patient  will be seen by their team as a consulting service.  On repeat exam the patient is in no distress, he is now committed by his wife. In discussing his medications, it seems as though he is no longer taking of Brilinta, this is a bit of a source of confusion.   MDM Rules/Calculators/A&P Adult male presents with possibly 1 day of gait abnormality.  Patient has history of prior strokes, as well as multiple other medical problems.  Here he is awake, alert, mildly hypertensive, but in no distress.  Patient is found to have gait difficulty on physical exam and with consideration of stroke patient had MRI performed.  Studies reviewed, discussed with patient, and subsequently with her specialist colleagues as well as with internal medicine given findings concerning for acute lacunar stroke.  Patient admitted for further monitoring, management. Final Clinical Impression(s) / ED Diagnoses Final diagnoses:  Acute CVA (cerebrovascular accident) [facility])     Carmin Muskrat, MD 10/24/20 1510

## 2020-10-24 NOTE — ED Notes (Signed)
Reminded pt I needed a urine provide him with a urinal

## 2020-10-24 NOTE — Consult Note (Signed)
Ebro A. Merlene Laughter, MD     [email or web address]          Darrell [name] is an 70 y.o. male.   ASSESSMENT/PLAN: ACUTE ISCHEMIC LACUNAR INFARCT INVOLVING THE LEFT CENTRUM SEMIOVALE: Risk factors includes previous infarct, hypertension, diabetes and dyslipidemia. Dual antiplatelet agents are recommended for 1 month and then the patient should be on aspirin 162 mg daily. He is encouraged to restart taking his statin especially given the significant hyperlipidemia. Additional risk factor modifications are also discussed.   Patient presents with the acute onset of ataxia, dysarthria and the falling. There are some reports of the patient falling to the right with possible right-sided weakness. The patient tells me that he has had 3 events in the last year. This event seems to be the most severe. Patient is on aspirin 81 mg monotherapy. He previously was on dual antiplatelet agents after previous events and the stenting. He was to be on a statin but apparently indicates that he was not taking it. His symptoms persist. He does not report chest pain, palpitation or shortness of breath. The review systems otherwise negative.   GENERAL: This is a pleasant male who is doing well at this time.  HEENT: Neck is supple no trauma noted.  ABDOMEN: Soft  EXTREMITIES: No edema   BACK: Normal alignment.  SKIN: Normal by inspection.    MENTAL STATUS: Alert and oriented including being oriented to his name, the year and the month. Speech - he is mildly dysarthric; language and cognition are generally intact. Judgment and insight normal.   CRANIAL NERVES: Pupils are equal, round and reactive to light and accommodation; extraocular movements are full, there is no significant nystagmus; upper and lower facial muscles are normal in strength and symmetric, there is no flattening of the nasolabial folds; tongue is midline; uvula is midline; shoulder elevation is normal.  MOTOR: Normal tone,  bulk and strength; no pronator drift. There is no drift of the upper lower extremities.  COORDINATION: Left finger to nose is normal, right finger to nose is normal, No rest tremor; no intention tremor; no postural tremor; no bradykinesia. Rapid alternating movements of the upper extremities are normal.  REFLEXES: Deep tendon reflexes are symmetrical and normal.   SENSATION: Normal to light touch and temperature. There is no extension on double simultaneous stimulation.    NIH stroke scale 1    Blood pressure (!) 172/102, pulse 72, temperature 97.7 F (36.5 C), temperature source Oral, resp. rate 16, SpO2 100 %.  Past Medical History:  Diagnosis Date   Aortic stenosis 05/2011   Severe by recent echo; AoV area ~0.77-79 cm2, peak /mean gradient 86 mmHg/ 55 mmHg.   Arthritis    back- low- [facility]- workmen's comp. situation    DDD (degenerative disc disease), lumbosacral    Depression    quick temper- per pt.    Diverticulosis    slight -per colonoscopy, pt. asymptomatic- thus far   GERD (gastroesophageal reflux disease)    Hiatal hernia    History of BPH    History of MRSA infection    Hyperlipidemia    Hypertension    [facility]- Dr. Ellyn Hack    Insomnia    Myocardial infarction [facility])    By report only, cardiac catheterization was negative for ischemia.   NIDDM (non-insulin dependent diabetes mellitus)    Type 2 NIDDM x 5 years   Prostate disease    Recurrent upper respiratory infection (URI)    cold- curently- "per  pt., on the mend"   S/P AVR (aortic valve replacement) 08/05/2011   Preop cath showed normal coronaries; AVR--Dr. Servando Snare, MD; 23 mm Magna Ease bioprosthetic Aortic Valve   STEMI (ST elevation myocardial infarction) ([place])    PCI/DES x1 to mLAD    Past Surgical History:  Procedure Laterality Date   ABDOMINAL EXPOSURE N/A 04/01/2012   Procedure: ABDOMINAL EXPOSURE;  Surgeon: Angelia Mould, MD;  Location: [facility];  Service: Vascular;  Laterality: N/A;    ANTERIOR LUMBAR FUSION N/A 04/01/2012   Procedure: ANTERIOR LUMBAR FUSION 1 LEVEL;  Surgeon: [name], MD;  Location: [facility];  Service: Orthopedics;  Laterality: N/A;  ALIF L5-S1   AORTIC VALVE REPLACEMENT  08/05/2011   Procedure: AORTIC VALVE REPLACEMENT (AVR);  Surgeon: [name], MD;  Location: [place];  Service: Open Heart Surgery;  Laterality: N/A;   CARDIAC CATHETERIZATION  06/2011    Minimal coronary disease   CORONARY/GRAFT ACUTE MI REVASCULARIZATION N/A 04/02/2019   Procedure: Coronary/Graft Acute MI Revascularization;  Surgeon: [name], MD;  Location: [facility];  Service: Cardiovascular;  Laterality: N/A;   LEFT AND RIGHT HEART CATHETERIZATION WITH CORONARY ANGIOGRAM  07/03/2011   Procedure: LEFT AND RIGHT HEART CATHETERIZATION WITH CORONARY ANGIOGRAM;  Surgeon: [name], MD;  Location: [facility];  Service: Cardiovascular;;   LEFT HEART CATH AND CORONARY ANGIOGRAPHY N/A 04/02/2019   Procedure: LEFT HEART CATH AND CORONARY ANGIOGRAPHY;  Surgeon: [name], MD;  Location: [facility];  Service: Cardiovascular;  Laterality: N/A;   LUMBAR FUSION  04/02/2012   Dr [name]   NASAL SEPTUM SURGERY     [facility] 10 yrs. ago   NM MYOVIEW LTD  April 2013   Low risk.    Family History  Problem Relation Age of Onset   Heart disease Mother    Heart attack Brother    Anesthesia problems Neg Hx     Social History:  reports that he has never smoked. He has never used smokeless tobacco. He reports that he does not drink alcohol and does not use drugs.  Allergies:  Allergies  Allergen Reactions   Tape Rash    Medications: Prior to Admission medications   Medication Sig Start Date End Date Taking? Authorizing Provider  acetaminophen (TYLENOL) 500 MG tablet Take 1,000 mg by mouth every 6 (six) hours as needed for headache (pain).    [provider]  ascorbic acid (VITAMIN C) 500 MG tablet Take 500 mg by mouth daily.    [provider]  aspirin EC 81 MG EC tablet Take 1 tablet (81 mg total) by mouth daily. 04/05/19   Cheryln Manly, NP  atorvastatin (LIPITOR) 80 MG tablet Take 1 tablet (80 mg total) by mouth daily. 07/28/19   [name], MD  Blood Glucose Calibration (ACCU-CHEK AVIVA) SOLN Check BS bid DX: E11.9 04/18/19   [name], MD  Blood Glucose Monitoring Suppl (ACCU-CHEK AVIVA PLUS) w/Device KIT Check BS bid DX: E11.9 04/18/19   [name], MD  BRILINTA 90 MG TABS tablet TAKE 1 TABLET BY MOUTH TWICE DAILY 05/07/20   Leonie Man, MD  Cholecalciferol (VITAMIN D3) 125 MCG (5000 UT) TABS Take 5,000 Units by mouth daily.    [provider]  glipiZIDE (GLUCOTROL XL) 5 MG 24 hr tablet TAKE 1 TABLET BY MOUTH DAILY WITH BREAKFAST 05/07/20   [name], MD  glucose blood (ACCU-CHEK AVIVA PLUS) test strip Check  BS bid DX: E11.9 04/18/19   [name], MD  JARDIANCE 25 MG TABS tablet TAKE 1 TABLET BY MOUTH BEFORE breakfast 01/11/20   [name], MD  Lancets (ACCU-CHEK MULTICLIX) lancets Check BS bid DX: E11.9 04/18/19   [name], MD  Lancets Misc. (ACCU-CHEK MULTICLIX LANCET DEV) KIT Check BS bid DX: E11.9 04/18/19   [name], MD  meclizine (ANTIVERT) 25 MG tablet Take 1 tablet (25 mg total) by mouth 3 (three) times daily as needed for dizziness. 08/15/20   Fransico Meadow, PA-C  metFORMIN (GLUCOPHAGE-XR) 500 MG 24 hr tablet TAKE 4 TABLETS BY MOUTH EVERY DAY WITH BREAKFAST Patient taking differently: Take 1,000 mg by mouth in the morning and at bedtime. 02/14/20   [name], MD  metoprolol tartrate (LOPRESSOR) 25 MG tablet Take 1 tablet (25 mg total) by mouth 2 (two) times daily. 07/26/19   [name], MD  nitroGLYCERIN (NITROSTAT) 0.4 MG SL tablet Place 1 tablet (0.4 mg total) under the tongue every 5 (five) minutes x 3 doses as needed for chest pain. 04/04/19   Cheryln Manly, NP  VITAMIN E PO Take 1 capsule by mouth daily.    [provider]  Zinc 50 MG TABS Take 50 mg by mouth daily.    [provider]    Scheduled Meds:  ascorbic acid  500 mg Oral Daily   aspirin  325 mg Oral Daily   atorvastatin  80 mg Oral q1800   cholecalciferol  5,000 Units Oral Daily   enoxaparin (LOVENOX) injection  40 mg Subcutaneous Q24H   insulin aspart  0-15 Units Subcutaneous TID WC   insulin aspart  0-5 Units Subcutaneous QHS   insulin glargine-yfgn  5 Units Subcutaneous QHS   metoprolol tartrate  25 mg Oral BID   sodium chloride flush  3 mL Intravenous Q12H   zinc sulfate  220 mg Oral Daily   Continuous Infusions:  sodium chloride 100 mL/hr (10/24/20 1721)   sodium chloride     PRN Meds:.sodium chloride, acetaminophen **OR** acetaminophen, meclizine, ondansetron **OR** ondansetron (ZOFRAN) IV, sodium chloride flush     Results for orders placed or performed during the hospital encounter of 10/24/20 (from the past 48 hour(s))  Protime-INR     Status: None   Collection Time: 10/24/20  9:57 AM  Result Value Ref Range   Prothrombin Time 13.2 11.4 - 15.2 seconds   INR 1.0 0.8 - 1.2    Comment: (NOTE) INR goal varies based on device and disease states. Performed at [facility], [address] [place], [place] [address]   APTT     Status: None   Collection Time: 10/24/20  9:57 AM  Result Value Ref Range   aPTT 26 24 - 36 seconds    Comment: Performed at [facility], [address], [place], [place] [address]  CBC     Status: Abnormal   Collection Time: 10/24/20  9:57 AM  Result Value Ref Range   WBC 6.8 4.0 - 10.5 K/uL   RBC 4.80 4.22 - 5.81 MIL/uL   Hemoglobin 16.1 13.0 - 17.0 g/dL   HCT 46.4 39.0 - 52.0 %   MCV 96.7 80.0 - 100.0 fL   MCH 33.5 26.0 - 34.0 pg   MCHC 34.7 30.0 - 36.0 g/dL   RDW 12.2 11.5 - 15.5 %   Platelets 133 (L) 150 - 400 K/uL   nRBC 0.0 0.0 - 0.2 %  Comment: Performed at [facility], [address], [place], [place] [address]  Differential     Status: None   Collection Time:  10/24/20  9:57 AM  Result Value Ref Range   Neutrophils Relative % 60 %   Neutro Abs 4.1 1.7 - 7.7 K/uL   Lymphocytes Relative 26 %   Lymphs Abs 1.8 0.7 - 4.0 K/uL   Monocytes Relative 6 %   Monocytes Absolute 0.4 0.1 - 1.0 K/uL   Eosinophils Relative 7 %   Eosinophils Absolute 0.4 0.0 - 0.5 K/uL   Basophils Relative 1 %   Basophils Absolute 0.0 0.0 - 0.1 K/uL   Immature Granulocytes 0 %   Abs Immature Granulocytes 0.02 0.00 - 0.07 K/uL    Comment: Performed at [facility], [address], [place], [place] [address]  Comprehensive metabolic panel     Status: Abnormal   Collection Time: 10/24/20  9:57 AM  Result Value Ref Range   Sodium 136 135 - 145 mmol/L   Potassium 3.9 3.5 - 5.1 mmol/L   Chloride 106 98 - 111 mmol/L   CO2 23 22 - 32 mmol/L   Glucose, Bld 187 (H) 70 - 99 mg/dL    Comment: Glucose reference range applies only to samples taken after fasting for at least 8 hours.   BUN 17 8 - 23 mg/dL   Creatinine, Ser 0.73 0.61 - 1.24 mg/dL   Calcium 8.8 (L) 8.9 - 10.3 mg/dL   Total Protein 6.7 6.5 - 8.1 g/dL   Albumin 3.9 3.5 - 5.0 g/dL   AST 24 15 - 41 U/L   ALT 42 0 - 44 U/L   Alkaline Phosphatase 56 38 - 126 U/L   Total Bilirubin 0.7 0.3 - 1.2 mg/dL   GFR, Estimated >60 >60 mL/min    Comment: (NOTE) Calculated using the CKD-EPI Creatinine Equation (2021)    Anion gap 7 5 - 15    Comment: Performed at [facility], [address], [place], [place] [address]  Magnesium     Status: None   Collection Time: 10/24/20 10:03 AM  Result Value Ref Range   Magnesium 1.9 1.7 - 2.4 mg/dL    Comment: Performed at [facility], [address], [place], [place] [address]  TSH     Status: None   Collection Time: 10/24/20 10:03 AM  Result Value Ref Range   TSH 0.967 0.350 - 4.500 uIU/mL    Comment: Performed by a 3rd Generation assay with a functional sensitivity of <=0.01 uIU/mL. Performed at [facility], [address], [place], [place] [address]   Lipid panel     Status:  Abnormal   Collection Time: 10/24/20 10:03 AM  Result Value Ref Range   Cholesterol 229 (H) 0 - 200 mg/dL   Triglycerides 161 (H) <150 mg/dL   HDL 37 (L) >40 mg/dL   Total CHOL/HDL Ratio 6.2 RATIO   VLDL 32 0 - 40 mg/dL   LDL Cholesterol 160 (H) 0 - 99 mg/dL    Comment:        Total Cholesterol/HDL:CHD Risk Coronary Heart Disease Risk Table                     Men   Women  1/2 Average Risk   3.4   3.3  Average Risk       5.0   4.4  2 X Average Risk   9.6   7.1  3 X Average Risk  23.4  11.0        Use the calculated Patient Ratio above and the CHD Risk Table to determine the patient's CHD Risk.        ATP III CLASSIFICATION (LDL):  <100     mg/dL   Optimal  100-129  mg/dL   Near or Above                    Optimal  130-159  mg/dL   Borderline  160-189  mg/dL   High  >190     mg/dL   Very High Performed at [name], [address] [place], [place] [address]   Resp Panel by RT-PCR (Flu A&B, Covid) Nasopharyngeal Swab     Status: None   Collection Time: 10/24/20 10:19 AM   Specimen: Nasopharyngeal Swab; Nasopharyngeal(NP) swabs in vial transport medium  Result Value Ref Range   SARS Coronavirus 2 by RT PCR NEGATIVE NEGATIVE    Comment: (NOTE) SARS-CoV-2 target nucleic acids are NOT DETECTED.  The SARS-CoV-2 RNA is generally detectable in upper respiratory specimens during the acute phase of infection. The lowest concentration of SARS-CoV-2 viral copies this assay can detect is 138 copies/mL. A negative result does not preclude SARS-Cov-2 infection and should not be used as the sole basis for treatment or other patient management decisions. A negative result may occur with  improper specimen collection/handling, submission of specimen other than nasopharyngeal swab, presence of viral mutation(s) within the areas targeted by this assay, and inadequate number of viral copies(<138 copies/mL). A negative result must be combined with clinical observations, patient history,  and epidemiological information. The expected result is Negative.  Fact Sheet for Patients:  EntrepreneurPulse.com.au  Fact Sheet for Healthcare Providers:  IncredibleEmployment.be  This test is no t yet approved or cleared by the [place] FDA and  has been authorized for detection and/or diagnosis of SARS-CoV-2 by FDA under an Emergency Use Authorization (EUA). This EUA will remain  in effect (meaning this test can be used) for the duration of the COVID-19 declaration under Section 564(b)(1) of the Act, 21 U.S.C.section 360bbb-3(b)(1), unless the authorization is terminated  or revoked sooner.       Influenza A by PCR NEGATIVE NEGATIVE   Influenza B by PCR NEGATIVE NEGATIVE    Comment: (NOTE) The Xpert Xpress SARS-CoV-2/FLU/RSV plus assay is intended as an aid in the diagnosis of influenza from Nasopharyngeal swab specimens and should not be used as a sole basis for treatment. Nasal washings and aspirates are unacceptable for Xpert Xpress SARS-CoV-2/FLU/RSV testing.  Fact Sheet for Patients: EntrepreneurPulse.com.au  Fact Sheet for Healthcare Providers: IncredibleEmployment.be  This test is not yet approved or cleared by the [place] FDA and has been authorized for detection and/or diagnosis of SARS-CoV-2 by FDA under an Emergency Use Authorization (EUA). This EUA will remain in effect (meaning this test can be used) for the duration of the COVID-19 declaration under Section 564(b)(1) of the Act, 21 U.S.C. section 360bbb-3(b)(1), unless the authorization is terminated or revoked.  Performed at [facility], [address] [place], [place] [address]   Ginger Carne 8, ED     Status: Abnormal   Collection Time: 10/24/20 10:19 AM  Result Value Ref Range   Sodium 140 135 - 145 mmol/L   Potassium 4.2 3.5 - 5.1 mmol/L   Chloride 105 98 - 111 mmol/L   BUN 17 8 - 23 mg/dL   Creatinine, Ser 0.80  0.61 - 1.24 mg/dL   Glucose, Bld  182 (H) 70 - 99 mg/dL    Comment: Glucose reference range applies only to samples taken after fasting for at least 8 hours.   Calcium, Ion 1.17 1.15 - 1.40 mmol/L   TCO2 28 22 - 32 mmol/L   Hemoglobin 16.3 13.0 - 17.0 g/dL   HCT 48.0 39.0 - 52.0 %  Urine rapid drug screen (hosp performed)not at [facility]     Status: None   Collection Time: 10/24/20 10:38 AM  Result Value Ref Range   Opiates NONE DETECTED NONE DETECTED   Cocaine NONE DETECTED NONE DETECTED   Benzodiazepines NONE DETECTED NONE DETECTED   Amphetamines NONE DETECTED NONE DETECTED   Tetrahydrocannabinol NONE DETECTED NONE DETECTED   Barbiturates NONE DETECTED NONE DETECTED    Comment: (NOTE) DRUG SCREEN FOR MEDICAL PURPOSES ONLY.  IF CONFIRMATION IS NEEDED FOR ANY PURPOSE, NOTIFY LAB WITHIN 5 DAYS.  LOWEST DETECTABLE LIMITS FOR URINE DRUG SCREEN Drug Class                     Cutoff (ng/mL) Amphetamine and metabolites    1000 Barbiturate and metabolites    200 Benzodiazepine                 889 Tricyclics and metabolites     300 Opiates and metabolites        300 Cocaine and metabolites        300 THC                            50 Performed at [facility], [address] [place], [place] [address]   Urinalysis, Routine w reflex microscopic     Status: Abnormal   Collection Time: 10/24/20 10:38 AM  Result Value Ref Range   Color, Urine YELLOW YELLOW   APPearance CLEAR CLEAR   Specific Gravity, Urine 1.029 1.005 - 1.030   pH 6.0 5.0 - 8.0   Glucose, UA >=500 (A) NEGATIVE mg/dL   Hgb urine dipstick NEGATIVE NEGATIVE   Bilirubin Urine NEGATIVE NEGATIVE   Ketones, ur 20 (A) NEGATIVE mg/dL   Protein, ur NEGATIVE NEGATIVE mg/dL   Nitrite NEGATIVE NEGATIVE   Leukocytes,Ua NEGATIVE NEGATIVE   WBC, UA 0-5 0 - 5 WBC/hpf   Bacteria, UA NONE SEEN NONE SEEN   Mucus PRESENT     Comment: Performed at [facility], [address] [place], [place] [address]  Glucose, capillary     Status:  Abnormal   Collection Time: 10/24/20  5:24 PM  Result Value Ref Range   Glucose-Capillary 107 (H) 70 - 99 mg/dL    Comment: Glucose reference range applies only to samples taken after fasting for at least 8 hours.    Studies/Results:  CAROTID  IMPRESSION: Plaque at the level of both carotid bulbs and proximal internal carotid arteries. No significant carotid stenosis identified with estimated bilateral ICA stenoses of less than 50%.     BRAIN MRI MRA IMPRESSION: 1. Small acute infarct in the left centrum semiovale. 2. Occluded right PCA at the P1/P2 junction and severe stenosis of the left P2 segment with distal reconstitution. These findings are favored to be chronic given lack of ischemia in the occipital lobes. 3. Small remote hemorrhage in the right frontal lobe near the vertex and small remote lacunar infarcts in the bilateral basal ganglia and cerebellar hemispheres. 4. Background of parenchymal volume loss and mild chronic white matter microangiopathy.  The brain MRI is reviewed person and shows a small subcortical infarct the region of centrum semiovale on the left side consistent with a lacunar event. There is a small subcortical single hemorrhage involving the right frontal region. This is likely chronic. There is significant global atrophy. There is moderate; fluid deep white matter leukoencephalopathy. There is remote infarct involving right thalamic capsular area and inferior cerebral regions.   Kofi A. Merlene Laughter, M.D.  Diplomate, Tax adviser of Psychiatry and Neurology ( Neurology). 10/24/2020, 5:33 PM

## 2020-10-25 ENCOUNTER — Observation Stay (HOSPITAL_COMMUNITY): Payer: Medicare Other

## 2020-10-25 DIAGNOSIS — I69351 Hemiplegia and hemiparesis following cerebral infarction affecting right dominant side: Secondary | ICD-10-CM | POA: Diagnosis not present

## 2020-10-25 DIAGNOSIS — Z8249 Family history of ischemic heart disease and other diseases of the circulatory system: Secondary | ICD-10-CM | POA: Diagnosis not present

## 2020-10-25 DIAGNOSIS — E08649 Diabetes mellitus due to underlying condition with hypoglycemia without coma: Secondary | ICD-10-CM | POA: Diagnosis not present

## 2020-10-25 DIAGNOSIS — I6389 Other cerebral infarction: Secondary | ICD-10-CM

## 2020-10-25 DIAGNOSIS — R29701 NIHSS score 1: Secondary | ICD-10-CM | POA: Diagnosis present

## 2020-10-25 DIAGNOSIS — I2510 Atherosclerotic heart disease of native coronary artery without angina pectoris: Secondary | ICD-10-CM | POA: Diagnosis present

## 2020-10-25 DIAGNOSIS — K219 Gastro-esophageal reflux disease without esophagitis: Secondary | ICD-10-CM | POA: Diagnosis present

## 2020-10-25 DIAGNOSIS — Z8614 Personal history of Methicillin resistant Staphylococcus aureus infection: Secondary | ICD-10-CM | POA: Diagnosis not present

## 2020-10-25 DIAGNOSIS — N400 Enlarged prostate without lower urinary tract symptoms: Secondary | ICD-10-CM | POA: Diagnosis present

## 2020-10-25 DIAGNOSIS — R2990 Unspecified symptoms and signs involving the nervous system: Secondary | ICD-10-CM | POA: Diagnosis present

## 2020-10-25 DIAGNOSIS — Z981 Arthrodesis status: Secondary | ICD-10-CM | POA: Diagnosis not present

## 2020-10-25 DIAGNOSIS — I252 Old myocardial infarction: Secondary | ICD-10-CM | POA: Diagnosis not present

## 2020-10-25 DIAGNOSIS — I639 Cerebral infarction, unspecified: Secondary | ICD-10-CM | POA: Diagnosis present

## 2020-10-25 DIAGNOSIS — I6381 Other cerebral infarction due to occlusion or stenosis of small artery: Secondary | ICD-10-CM | POA: Diagnosis present

## 2020-10-25 DIAGNOSIS — F32A Depression, unspecified: Secondary | ICD-10-CM | POA: Diagnosis present

## 2020-10-25 DIAGNOSIS — E782 Mixed hyperlipidemia: Secondary | ICD-10-CM | POA: Diagnosis present

## 2020-10-25 DIAGNOSIS — E559 Vitamin D deficiency, unspecified: Secondary | ICD-10-CM | POA: Diagnosis present

## 2020-10-25 DIAGNOSIS — I10 Essential (primary) hypertension: Secondary | ICD-10-CM | POA: Diagnosis present

## 2020-10-25 DIAGNOSIS — E1165 Type 2 diabetes mellitus with hyperglycemia: Secondary | ICD-10-CM | POA: Diagnosis present

## 2020-10-25 DIAGNOSIS — I350 Nonrheumatic aortic (valve) stenosis: Secondary | ICD-10-CM | POA: Diagnosis present

## 2020-10-25 DIAGNOSIS — R270 Ataxia, unspecified: Secondary | ICD-10-CM | POA: Diagnosis present

## 2020-10-25 DIAGNOSIS — M5137 Other intervertebral disc degeneration, lumbosacral region: Secondary | ICD-10-CM | POA: Diagnosis present

## 2020-10-25 DIAGNOSIS — G4700 Insomnia, unspecified: Secondary | ICD-10-CM | POA: Diagnosis present

## 2020-10-25 DIAGNOSIS — Z20822 Contact with and (suspected) exposure to covid-19: Secondary | ICD-10-CM | POA: Diagnosis present

## 2020-10-25 DIAGNOSIS — Z953 Presence of xenogenic heart valve: Secondary | ICD-10-CM | POA: Diagnosis not present

## 2020-10-25 DIAGNOSIS — N429 Disorder of prostate, unspecified: Secondary | ICD-10-CM | POA: Diagnosis present

## 2020-10-25 DIAGNOSIS — R471 Dysarthria and anarthria: Secondary | ICD-10-CM | POA: Diagnosis present

## 2020-10-25 DIAGNOSIS — Z7984 Long term (current) use of oral hypoglycemic drugs: Secondary | ICD-10-CM | POA: Diagnosis not present

## 2020-10-25 LAB — BASIC METABOLIC PANEL
Anion gap: 5 (ref 5–15)
BUN: 16 mg/dL (ref 8–23)
CO2: 27 mmol/L (ref 22–32)
Calcium: 8.4 mg/dL — ABNORMAL LOW (ref 8.9–10.3)
Chloride: 105 mmol/L (ref 98–111)
Creatinine, Ser: 0.72 mg/dL (ref 0.61–1.24)
GFR, Estimated: >60 mL/min (ref >60)
Glucose, Bld: 136 mg/dL — ABNORMAL HIGH (ref 70–99)
Potassium: 4.5 mmol/L (ref 3.5–5.1)
Sodium: 137 mmol/L (ref 135–145)

## 2020-10-25 LAB — CBC
HCT: 44.8 % (ref 39.0–52.0)
Hemoglobin: 15.4 g/dL (ref 13.0–17.0)
MCH: 33.9 pg (ref 26.0–34.0)
MCHC: 34.4 g/dL (ref 30.0–36.0)
MCV: 98.7 fL (ref 80.0–100.0)
Platelets: 125 10*3/uL — ABNORMAL LOW (ref 150–400)
RBC: 4.54 MIL/uL (ref 4.22–5.81)
RDW: 12.3 % (ref 11.5–15.5)
WBC: 7.5 10*3/uL (ref 4.0–10.5)
nRBC: 0 % (ref 0.0–0.2)

## 2020-10-25 LAB — ECHOCARDIOGRAM COMPLETE
AR max vel: 1.05 cm2
AV Area VTI: 1.12 cm2
AV Area mean vel: 1.17 cm2
AV Mean grad: 11 mmHg
AV Peak grad: 20.6 mmHg
Ao pk vel: 2.27 m/s
Area-P 1/2: 1.72 cm2
Height: 69 in
S' Lateral: 2.1 cm
Weight: 2800.72 oz

## 2020-10-25 LAB — GLUCOSE, CAPILLARY
Glucose-Capillary: 149 mg/dL — ABNORMAL HIGH (ref 70–99)
Glucose-Capillary: 221 mg/dL — ABNORMAL HIGH (ref 70–99)
Glucose-Capillary: 134 mg/dL — ABNORMAL HIGH (ref 70–99)
Glucose-Capillary: 179 mg/dL — ABNORMAL HIGH (ref 70–99)

## 2020-10-25 LAB — LIPID PANEL
Cholesterol: 187 mg/dL (ref 0–200)
HDL: 30 mg/dL — ABNORMAL LOW (ref >40)
LDL Cholesterol: 116 mg/dL — ABNORMAL HIGH (ref 0–99)
Total CHOL/HDL Ratio: 6.2 RATIO
Triglycerides: 206 mg/dL — ABNORMAL HIGH (ref <150)
VLDL: 41 mg/dL — ABNORMAL HIGH (ref 0–40)

## 2020-10-25 MED ORDER — PANTOPRAZOLE SODIUM 40 MG PO TBEC
40.0000 mg | DELAYED_RELEASE_TABLET | Freq: Every day | ORAL | Status: DC
  Administered 2020-10-25 – 2020-10-27 (×3): 40 mg via ORAL
  Filled 2020-10-25 (×3): qty 1

## 2020-10-25 MED ORDER — CLOPIDOGREL BISULFATE 75 MG PO TABS
75.0000 mg | ORAL_TABLET | Freq: Every day | ORAL | Status: DC
  Administered 2020-10-25 – 2020-10-27 (×3): 75 mg via ORAL
  Filled 2020-10-25 (×3): qty 1

## 2020-10-25 MED ORDER — EZETIMIBE 10 MG PO TABS
10.0000 mg | ORAL_TABLET | Freq: Every day | ORAL | Status: DC
  Administered 2020-10-25 – 2020-10-27 (×3): 10 mg via ORAL
  Filled 2020-10-25 (×3): qty 1

## 2020-10-25 NOTE — TOC Initial Note (Signed)
Transition of Care [facility]) - Initial/Assessment Note    Patient Details  Name: Darrell [name] MRN: [id number] Date of Birth: [birth year]  Transition of [facility] CM/SW Contact:    Natasha Bence, LCSW Phone Number: 10/25/2020, 12:29 PM  Clinical Narrative:                 Patient is a 70 year old male admitted for Stroke-like symptoms. CSW conducted initial assessment. Patient reported to PT that at baseline patient is ambulatory and able to complete ADL's independently. Patient reports that at baseline his movement was slow, but he maintained enough strenghth to complete ADL's without difficulty. Patients wife reported that at baseline patient was able to complete ADL's independently but in the past few weeks patient's strength has declined. Patient and wife agreeable to CIR referral. CSW referred patient to CIR. TOC to follow.  Expected Discharge Plan: IP Rehab Facility Barriers to Discharge: Continued Medical Work up   Patient Goals and CMS Choice Patient states their goals for this hospitalization and ongoing recovery are:: In patient rehab CMS Medicare.gov Compare Post Acute Care list provided to:: Patient Choice offered to / list presented to : Patient  Expected Discharge Plan and Services Expected Discharge Plan: [place]                                              Prior Living Arrangements/Services   Lives with:: Self, Spouse Patient language and need for interpreter reviewed:: Yes Do you feel safe going back to the place where you live?: Yes      Need for Family Participation in Patient Care: Yes (Comment) Care giver support system in place?: Yes (comment)   Criminal Activity/Legal Involvement Pertinent to Current Situation/Hospitalization: No - Comment as needed  Activities of Daily Living Home Assistive Devices/Equipment: Dentures (specify type), Walker (specify type) ADL Screening (condition at time of admission) Patient's cognitive ability  adequate to safely complete daily activities?: Yes Is the patient deaf or have difficulty hearing?: No Does the patient have difficulty seeing, even when wearing glasses/contacts?: No Does the patient have difficulty concentrating, remembering, or making decisions?: No Patient able to express need for assistance with ADLs?: Yes Does the patient have difficulty dressing or bathing?: No Independently performs ADLs?: Yes (appropriate for developmental age) Does the patient have difficulty walking or climbing stairs?: Yes Weakness of Legs: Both Weakness of Arms/Hands: None  Permission Sought/Granted Permission sought to share information with : Family Supports Permission granted to share information with : Yes, Verbal Permission Granted  Share Information with NAME: Winfred [place]  Permission granted to share info w AGENCY: CIR  Permission granted to share info w Relationship: spouse  Permission granted to share info w Contact Information: [phone]  Emotional Assessment     Affect (typically observed): Accepting, Adaptable Orientation: : Oriented to Self, Oriented to Situation, Oriented to Place, Oriented to  Time Alcohol / Substance Use: Not Applicable Psych Involvement: No (comment)  Admission diagnosis:  Stroke-like symptoms [R29.90] Acute CVA (cerebrovascular accident) [name]) [I63.9] Patient Active Problem List   Diagnosis Date Noted   Stroke-like symptoms 10/24/2020   Bilateral inguinal hernia without obstruction or gangrene 03/27/2020   Hyperlipidemia associated with type 2 diabetes mellitus ([place])    Diabetes mellitus due to underlying condition, uncontrolled, with hypoglycemia without coma ([place])    Acute ST elevation myocardial infarction (STEMI)  involving left anterior descending (LAD) coronary artery ([place]) 04/02/2019   ST elevation myocardial infarction involving left anterior descending (LAD) coronary artery ([place]) 04/02/2019   Vitamin D deficiency 03/13/2015   DDD  (degenerative disc disease), lumbar 04/01/2012   Degenerative disc disease, lumbar 03/24/2012   MRSA cellulitis 10/20/2011   S/P AVR (aortic valve replacement) - for severe AS;  08/05/2011   GERD (gastroesophageal reflux disease)    Insomnia    Type 2 diabetes mellitus (HCC)     Class: Diagnosis of   History of BPH    ADENOMATOUS COLONIC POLYP 07/08/2007   HYPERLIPIDEMIA 07/08/2007    Class: Diagnosis of   DEPRESSION 07/08/2007   Essential hypertension 07/08/2007    Class: Diagnosis of   DIVERTICULOSIS 07/08/2007   ABDOMINAL PAIN-LUQ 07/08/2007   PCP:  Celene Squibb, MD Pharmacy:   [facility], [place] [id number] [place] [id number] Phone: [phone] Fax: [phone]     Social Determinants of Health (SDOH) Interventions    Readmission Risk Interventions No flowsheet data found.

## 2020-10-25 NOTE — Plan of Care (Signed)
  Problem: Acute Rehab PT Goals(only PT should resolve) Goal: Pt Will Go Supine/Side To Sit Outcome: Progressing Flowsheets (Taken 10/25/2020 1103) Pt will go Supine/Side to Sit: with modified independence Goal: Patient Will Transfer Sit To/From Stand Outcome: Progressing Flowsheets (Taken 10/25/2020 1103) Patient will transfer sit to/from stand: with modified independence Goal: Pt Will Transfer Bed To Chair/Chair To Bed Outcome: Progressing Flowsheets (Taken 10/25/2020 1103) Pt will Transfer Bed to Chair/Chair to Bed: with modified independence Goal: Pt Will Ambulate Outcome: Progressing Flowsheets (Taken 10/25/2020 1103) Pt will Ambulate:  100 feet  with modified independence  with supervision  with cane  with least restrictive assistive device   11:04 AM, 10/25/20 Lonell Grandchild, MPT Physical Therapist with [facility] [phone] office [phone] mobile phone

## 2020-10-25 NOTE — Plan of Care (Signed)
  Problem: Acute Rehab OT Goals (only OT should resolve) Goal: Pt. Will Perform Grooming Flowsheets (Taken 10/25/2020 0951) Pt Will Perform Grooming:  Independently  standing Goal: Pt. Will Perform Upper Body Dressing Flowsheets (Taken 10/25/2020 0951) Pt Will Perform Upper Body Dressing:  Independently  sitting  standing Goal: Pt. Will Perform Lower Body Dressing Flowsheets (Taken 10/25/2020 0951) Pt Will Perform Lower Body Dressing:  Independently  sit to/from stand  sitting/lateral leans Goal: Pt. Will Transfer To Toilet [place] (Taken 10/25/2020 0951) Pt Will Transfer to Toilet:  Independently  ambulating  Magen Suriano OT, MOT

## 2020-10-25 NOTE — Evaluation (Signed)
Physical Therapy Evaluation Patient Details Name: Darrell [name] MRN: [id number] DOB: Jun 12, 1950 Today's Date: 10/25/2020   History of Present Illness  Darrell [name] is a 70 y.o. male with medical history significant of hypertension, hyperlipidemia, gastroesophageal reflux disease, type 2 diabetes mellitus, history of coronary disease/STEMI (s/p PCI and drug-eluting stent to LAD) and prior history of TIA/lacunar infarct; who presented to the emergency department secondary to right-sided weakness and ataxia.  Patient reports symptoms started around 10 PM mild vague on 10/23/2020 (last seen normal); and a progressive throughout and worsen when he woke up on the day of admission.  Patient expressed experiencing a mechanical fall while losing balance when standing and decided to seek medical attention at that time.  He denies any fever, chills, cough, hematuria, dysuria, hematochezia, melena, abdominal pain, headaches or any other focal deficits.  Patient also expressed no chest pain or shortness of breath.   Clinical Impression  Patient demonstrates labored movement for sitting up at bedside with occasional posterior leaning, able to stand, transfer without AD, but unsteady on feet with narrow base of support, decreased arm swing and steppage like gait due to limited bilateral ankle dorsiflexion worse with RLE, fair return for attempting heel to toe stepping having to slow cadence and required Min guard/Min assist for safety.  Patient tolerated sitting up in chair after therapy - nursing staff aware.  Patient will benefit from continued physical therapy in hospital and recommended venue below to increase strength, balance, endurance for safe ADLs and gait.      Follow Up Recommendations CIR;Supervision for mobility/OOB;Supervision - Intermittent    Equipment Recommendations  Cane    Recommendations for Other Services       Precautions / Restrictions Precautions Precautions:  Fall Restrictions Weight Bearing Restrictions: No      Mobility  Bed Mobility Overal bed mobility: Needs Assistance Bed Mobility: Supine to Sit     Supine to sit: Supervision     General bed mobility comments: as per OT notes    Transfers Overall transfer level: Needs assistance   Transfers: Sit to/from Stand;Stand Pivot Transfers Sit to Stand: Min guard Stand pivot transfers: Min guard       General transfer comment: as per OT notes  Ambulation/Gait Ambulation/Gait assistance: Min guard;Min assist Gait Distance (Feet): 65 Feet Assistive device: None;1 person hand held assist Gait Pattern/deviations: Decreased step length - right;Decreased step length - left;Decreased stride length;Decreased stance time - right;Decreased dorsiflexion - right;Steppage Gait velocity: decreased   General Gait Details: slow labored cadence with decreased arm swiing, limited bilateral dorsiflexion (right worse than left)  with fair carryover for heel to toe stepping after verbal cues, limited mostly due to fatigue  Stairs            Wheelchair Mobility    Modified Rankin (Stroke Patients Only)       Balance Overall balance assessment: Needs assistance Sitting-balance support: Feet supported;No upper extremity supported Sitting balance-Leahy Scale: Fair Sitting balance - Comments: seated at EOB Postural control: Posterior lean Standing balance support: During functional activity;No upper extremity supported Standing balance-Leahy Scale: Fair Standing balance comment: without AD                             Pertinent Vitals/Pain Pain Assessment: 0-10 Pain Score: 4  Pain Location: low back and R clivicle area Pain Descriptors / Indicators: Sore;Dull Pain Intervention(s): Limited activity within patient's tolerance;Monitored during session;Repositioned    Home  Living Family/patient expects to be discharged to:: Private residence Living Arrangements:  Spouse/significant other Available Help at Discharge: Family;Available PRN/intermittently Type of Home: House Home Access: Stairs to enter Entrance Stairs-Rails: Left Entrance Stairs-Number of Steps: 4 Home Layout: Two level;Laundry or work area in [place]: Environmental consultant - 2 wheels;Bedside commode      Prior Function Level of Independence: Independent with assistive device(s)         Comments: Hydrographic surveyor, drives     Hand Dominance   Dominant Hand: Right    Extremity/Trunk Assessment   Upper Extremity Assessment Upper Extremity Assessment: Defer to OT evaluation    Lower Extremity Assessment Lower Extremity Assessment: Generalized weakness    Cervical / Trunk Assessment Cervical / Trunk Assessment: Kyphotic  Communication   Communication: No difficulties  Cognition Arousal/Alertness: Awake/alert Behavior During Therapy: WFL for tasks assessed/performed Overall Cognitive Status: Within Functional Limits for tasks assessed                                        General Comments      Exercises     Assessment/Plan    PT Assessment Patient needs continued PT services  PT Problem List Decreased strength;Decreased activity tolerance;Decreased balance;Decreased mobility       PT Treatment Interventions DME instruction;Gait training;Stair training;Functional mobility training;Therapeutic activities;Neuromuscular re-education;Balance training;Patient/family education    PT Goals (Current goals can be found in the Care Plan section)  Acute Rehab PT Goals Patient Stated Goal: return home PT Goal Formulation: With patient Time For Goal Achievement: 11/08/20 Potential to Achieve Goals: Good    Frequency Min 5X/week   Barriers to discharge        Co-evaluation PT/OT/SLP Co-Evaluation/Treatment: Yes Reason for Co-Treatment: To address functional/ADL transfers PT goals addressed during session: Mobility/safety with  mobility;Balance;Proper use of DME OT goals addressed during session: ADL's and self-care;Strengthening/ROM       AM-PAC PT "6 Clicks" Mobility  Outcome Measure Help needed turning from your back to your side while in a flat bed without using bedrails?: None Help needed moving from lying on your back to sitting on the side of a flat bed without using bedrails?: A Little Help needed moving to and from a bed to a chair (including a wheelchair)?: A Little Help needed standing up from a chair using your arms (e.g., wheelchair or bedside chair)?: A Little Help needed to walk in hospital room?: A Little Help needed climbing 3-5 steps with a railing? : A Little 6 Click Score: 19    End of Session   Activity Tolerance: Patient tolerated treatment well;Patient limited by fatigue Patient left: in chair;with call bell/phone within reach Nurse Communication: Mobility status PT Visit Diagnosis: Unsteadiness on feet (R26.81);Other abnormalities of gait and mobility (R26.89);Muscle weakness (generalized) (M62.81)    Time: [id number] PT Time Calculation (min) (ACUTE ONLY): 30 min   Charges:   PT Evaluation $PT Eval Moderate Complexity: 1 Mod PT Treatments $Therapeutic Activity: 23-37 mins        11:01 AM, 10/25/20 Lonell Grandchild, MPT Physical Therapist with [facility] [phone] office [phone] mobile phone

## 2020-10-25 NOTE — Progress Notes (Signed)
PROGRESS NOTE    Darrell [name]  [id number] DOB: Jan 09, 1951 DOA: 10/24/2020 PCP: Celene Squibb, MD   Chief Complaint  Patient presents with   Weakness    Brief admission Narrative:  Darrell [name] is a 70 y.o. male with medical history significant of hypertension, hyperlipidemia, gastroesophageal reflux disease, type 2 diabetes mellitus, history of coronary disease/STEMI (s/p PCI and drug-eluting stent to LAD) and prior history of TIA/lacunar infarct; who presented to the emergency department secondary to right-sided weakness and ataxia.  Patient reports symptoms started around 10 PM mild vague on 10/23/2020 (last seen normal); and a progressive throughout and worsen when he woke up on the day of admission.  Patient expressed experiencing a mechanical fall while losing balance when standing and decided to seek medical attention at that time.  He denies any fever, chills, cough, hematuria, dysuria, hematochezia, melena, abdominal pain, headaches or any other focal deficits.  Patient also expressed no chest pain or shortness of breath.   Of note, patient is now vaccinated against COVID, COVID PCR in the ED negative.  Reported no sick contacts.  Assessment & Plan: 1-acute left syndrome same evaluate ischemic infarct -2D echo not demonstrating abnormalities -Telemetry no revealing arrhythmias -Carotid ultrasound demonstrating plaque at the level of both carotid bulbs and proximal internal carotid arteries no significant carotid stenosis identified with estimated bilateral ICA stenosis of less than 50%. -Follow recommendation by neurology service dual antiplatelet therapy for 1 month (aspirin and Plavix); continue 162 mg aspirin on daily basis for secondary prevention after that. -Continue good control of patient's blood sugar, blood pressure and cholesterol level, for risk factor modifications. -PT/OT recommending CIR.  2-HTN -Stable overall -Continue allowing for permissive  hypertension. -Heart healthy diet encouraged.  3-hyperlipidemia -LDL 116 -Continue Lipitor 80 mg daily and start Zetia 10 mg daily. -Heart healthy diet encouraged.  4-poorly controlled type 2 diabetes mellitus -A1c 8.9 -Continue sliding scale insulin and Levemir -Follow CBGs -Patient will require further adjustment of hypoglycemic regimen as an outpatient according to achieve better control A1c less than 7. -Modify carbohydrate diet encouraged.  5-history of coronary disease -Continue aspirin, statins and beta-blocker -No chest pain or shortness of breath -Continue outpatient follow-up with cardiology service.  6-vitamin D deficiency -Continue supplementation.    DVT prophylaxis: Lovenox Code Status: Full code Family Communication: No family at bedside during today's evaluation. Disposition:   Status is: Inpatient    Dispo: The patient is from: Home              Anticipated d/c is to: CIR              Patient currently is medically stable to d/c.   Difficult to place patient No       Consultants:  Neurology service  Procedures:  See below for x-ray and echocardiogram reports.  Antimicrobials:  None  Subjective: Afebrile, no chest pain, no nausea, no vomiting.  Still reporting feeling slightly weak on his right side and experiencing poor balance.  Objective: Vitals:   10/24/20 2335 10/25/20 0203 10/25/20 0500 10/25/20 1135  BP: 131/69 (!) 143/78    Pulse: 65 61    Resp: 17     Temp: 97.9 F (36.6 C) 98.1 F (36.7 C)    TempSrc: Oral Oral    SpO2: 95% 98%  97%  Weight:   79.4 kg   Height:        Intake/Output Summary (Last 24 hours) at 10/25/2020 1652 Last data filed at 10/25/2020  0408 Gross per 24 hour  Intake 1664.45 ml  Output 1750 ml  Net -85.55 ml   Filed Weights   10/24/20 1824 10/25/20 0500  Weight: 82 kg 79.4 kg    Examination:  General exam: Appears calm and in no major distress; denies chest pain or shortness of breath.   Hemodynamically stable. Respiratory system: Clear to auscultation. Respiratory effort normal.  No using accessory muscle.  Good saturation on room air. Cardiovascular system: S1 & S2 heard, RRR. No JVD, murmurs, rubs, gallops or clicks. No pedal edema. Gastrointestinal system: Abdomen is nondistended, soft and nontender. No organomegaly or masses felt. Normal bowel sounds heard. Central nervous system: Alert and oriented.  Right-sided weakness (especially right lower extremity) patient demonstrating dysmetria and expressing poor balance.  Unchanged from 10/24/20 Extremities: No cyanosis or clubbing. Skin: No petechiae. Psychiatry: Judgement and insight appear normal. Mood & affect appropriate.    Data Reviewed: I have personally reviewed following labs and imaging studies  CBC: Recent Labs  Lab 10/24/20 0957 10/24/20 1019 10/25/20 0550  WBC 6.8  --  7.5  NEUTROABS 4.1  --   --   HGB 16.1 16.3 15.4  HCT 46.4 48.0 44.8  MCV 96.7  --  98.7  PLT 133*  --  125*    Basic Metabolic Panel: Recent Labs  Lab 10/24/20 0957 10/24/20 1003 10/24/20 1019 10/25/20 0550  NA 136  --  140 137  K 3.9  --  4.2 4.5  CL 106  --  105 105  CO2 23  --   --  27  GLUCOSE 187*  --  182* 136*  BUN 17  --  17 16  CREATININE 0.73  --  0.80 0.72  CALCIUM 8.8*  --   --  8.4*  MG  --  1.9  --   --     GFR: Estimated Creatinine Clearance: 84.7 mL/min (by C-G formula based on SCr of 0.72 mg/dL).  Liver Function Tests: Recent Labs  Lab 10/24/20 0957  AST 24  ALT 42  ALKPHOS 56  BILITOT 0.7  PROT 6.7  ALBUMIN 3.9   CBG: Recent Labs  Lab 10/24/20 1724 10/24/20 2240 10/25/20 0719 10/25/20 1116 10/25/20 1640  GLUCAP 107* 155* 149* 221* 134*    Recent Results (from the past 240 hour(s))  Resp Panel by RT-PCR (Flu A&B, Covid) Nasopharyngeal Swab     Status: None   Collection Time: 10/24/20 10:19 AM   Specimen: Nasopharyngeal Swab; Nasopharyngeal(NP) swabs in vial transport medium  Result  Value Ref Range Status   SARS Coronavirus 2 by RT PCR NEGATIVE NEGATIVE Final    Comment: (NOTE) SARS-CoV-2 target nucleic acids are NOT DETECTED.  The SARS-CoV-2 RNA is generally detectable in upper respiratory specimens during the acute phase of infection. The lowest concentration of SARS-CoV-2 viral copies this assay can detect is 138 copies/mL. A negative result does not preclude SARS-Cov-2 infection and should not be used as the sole basis for treatment or other patient management decisions. A negative result may occur with  improper specimen collection/handling, submission of specimen other than nasopharyngeal swab, presence of viral mutation(s) within the areas targeted by this assay, and inadequate number of viral copies(<138 copies/mL). A negative result must be combined with clinical observations, patient history, and epidemiological information. The expected result is Negative.  Fact Sheet for Patients:  EntrepreneurPulse.com.au  Fact Sheet for Healthcare Providers:  IncredibleEmployment.be  This test is no t yet approved or cleared by the [place] FDA  and  has been authorized for detection and/or diagnosis of SARS-CoV-2 by FDA under an Emergency Use Authorization (EUA). This EUA will remain  in effect (meaning this test can be used) for the duration of the COVID-19 declaration under Section 564(b)(1) of the Act, 21 U.S.C.section 360bbb-3(b)(1), unless the authorization is terminated  or revoked sooner.       Influenza A by PCR NEGATIVE NEGATIVE Final   Influenza B by PCR NEGATIVE NEGATIVE Final    Comment: (NOTE) The Xpert Xpress SARS-CoV-2/FLU/RSV plus assay is intended as an aid in the diagnosis of influenza from Nasopharyngeal swab specimens and should not be used as a sole basis for treatment. Nasal washings and aspirates are unacceptable for Xpert Xpress SARS-CoV-2/FLU/RSV testing.  Fact Sheet for  Patients: EntrepreneurPulse.com.au  Fact Sheet for Healthcare Providers: IncredibleEmployment.be  This test is not yet approved or cleared by the [place] FDA and has been authorized for detection and/or diagnosis of SARS-CoV-2 by FDA under an Emergency Use Authorization (EUA). This EUA will remain in effect (meaning this test can be used) for the duration of the COVID-19 declaration under Section 564(b)(1) of the Act, 21 U.S.C. section 360bbb-3(b)(1), unless the authorization is terminated or revoked.  Performed at [facility], [address] [place], [place] [address]      Radiology Studies: MR ANGIO HEAD WO CONTRAST  Result Date: 10/24/2020 CLINICAL DATA:  Gait difficulty, history of CVA EXAM: MRI HEAD WITHOUT CONTRAST MRA HEAD WITHOUT CONTRAST TECHNIQUE: Multiplanar, multi-echo pulse sequences of the brain and surrounding structures were acquired without intravenous contrast. Angiographic images of the Circle of Willis were acquired using MRA technique without intravenous contrast. COMPARISON:  No pertinent prior exam. CT head 09/14/2020 FINDINGS: MRI HEAD FINDINGS Brain: There is a small focus of diffusion restriction in the left centrum semiovale with faint associated FLAIR signal abnormality. There is a punctate chronic microhemorrhage in the left parietal lobe. There is additional cortically based T2* hypointensity in the right frontal lobe near the vertex likely reflecting prior hemorrhage. There are remote lacunar infarcts in the bilateral cerebellar hemispheres and basal ganglia. There is patchy FLAIR signal abnormality throughout the remainder of the subcortical and periventricular white matter, nonspecific but likely reflecting sequela of chronic white matter microangiopathy. There is mild parenchymal volume loss with commensurate enlargement of the ventricular system. There is no mass lesion. There is no midline shift. Vascular: Normal flow  voids. Skull and upper cervical spine: Normal marrow signal. Sinuses/Orbits: There is mild mucosal thickening throughout the paranasal sinuses. The globes and orbits are unremarkable. Other: None. MRA HEAD FINDINGS Anterior circulation: The intracranial ICAs are patent. The bilateral MCAs and ACAs are patent. No significant stenosis, occlusion, dissection, or aneurysm is identified. Posterior circulation: The V4 segments of the vertebral arteries are patent. The right posterior cerebral artery is occluded at the P1/P2 junction. There is severe stenosis of the proximal left P2 segment measuring approximately 7 mm. The distal left PCA is patent. Anatomic variants: None. IMPRESSION: 1. Small acute infarct in the left centrum semiovale. 2. Occluded right PCA at the P1/P2 junction and severe stenosis of the left P2 segment with distal reconstitution. These findings are favored to be chronic given lack of ischemia in the occipital lobes. 3. Small remote hemorrhage in the right frontal lobe near the vertex and small remote lacunar infarcts in the bilateral basal ganglia and cerebellar hemispheres. 4. Background of parenchymal volume loss and mild chronic white matter microangiopathy. Electronically Signed   By: Valetta Mole  M.D.   On: 10/24/2020 11:35   MR BRAIN WO CONTRAST  Result Date: 10/24/2020 CLINICAL DATA:  Gait difficulty, history of CVA EXAM: MRI HEAD WITHOUT CONTRAST MRA HEAD WITHOUT CONTRAST TECHNIQUE: Multiplanar, multi-echo pulse sequences of the brain and surrounding structures were acquired without intravenous contrast. Angiographic images of the Circle of Willis were acquired using MRA technique without intravenous contrast. COMPARISON:  No pertinent prior exam. CT head 09/14/2020 FINDINGS: MRI HEAD FINDINGS Brain: There is a small focus of diffusion restriction in the left centrum semiovale with faint associated FLAIR signal abnormality. There is a punctate chronic microhemorrhage in the left parietal  lobe. There is additional cortically based T2* hypointensity in the right frontal lobe near the vertex likely reflecting prior hemorrhage. There are remote lacunar infarcts in the bilateral cerebellar hemispheres and basal ganglia. There is patchy FLAIR signal abnormality throughout the remainder of the subcortical and periventricular white matter, nonspecific but likely reflecting sequela of chronic white matter microangiopathy. There is mild parenchymal volume loss with commensurate enlargement of the ventricular system. There is no mass lesion. There is no midline shift. Vascular: Normal flow voids. Skull and upper cervical spine: Normal marrow signal. Sinuses/Orbits: There is mild mucosal thickening throughout the paranasal sinuses. The globes and orbits are unremarkable. Other: None. MRA HEAD FINDINGS Anterior circulation: The intracranial ICAs are patent. The bilateral MCAs and ACAs are patent. No significant stenosis, occlusion, dissection, or aneurysm is identified. Posterior circulation: The V4 segments of the vertebral arteries are patent. The right posterior cerebral artery is occluded at the P1/P2 junction. There is severe stenosis of the proximal left P2 segment measuring approximately 7 mm. The distal left PCA is patent. Anatomic variants: None. IMPRESSION: 1. Small acute infarct in the left centrum semiovale. 2. Occluded right PCA at the P1/P2 junction and severe stenosis of the left P2 segment with distal reconstitution. These findings are favored to be chronic given lack of ischemia in the occipital lobes. 3. Small remote hemorrhage in the right frontal lobe near the vertex and small remote lacunar infarcts in the bilateral basal ganglia and cerebellar hemispheres. 4. Background of parenchymal volume loss and mild chronic white matter microangiopathy. Electronically Signed   By: Valetta Mole M.D.   On: 10/24/2020 11:35   US Carotid Bilateral (at [facility] and [facility] only)  Result Date:  10/24/2020 CLINICAL DATA:  Cerebral infarction. EXAM: BILATERAL CAROTID DUPLEX ULTRASOUND TECHNIQUE: Pearline Cables scale imaging, color Doppler and duplex ultrasound were performed of bilateral carotid and vertebral arteries in the neck. COMPARISON:  None. FINDINGS: Criteria: Quantification of carotid stenosis is based on velocity parameters that correlate the residual internal carotid diameter with NASCET-based stenosis levels, using the diameter of the distal internal carotid lumen as the denominator for stenosis measurement. The following velocity measurements were obtained: RIGHT ICA:  105/19 cm/sec CCA:  [id number] cm/sec SYSTOLIC ICA/CCA RATIO:  1.0 ECA:  171 cm/sec LEFT ICA:  146/36 distal, 84/15 proximal cm/sec CCA:  [id number] cm/sec SYSTOLIC ICA/CCA RATIO:  1.4 ECA:  139 cm/sec RIGHT CAROTID ARTERY: Relatively mild amount of partially calcified plaque at the level of the carotid bulb and proximal right ICA. Moderate plaque present at the level of the proximal external carotid artery. Estimated right ICA stenosis is less than 50%. RIGHT VERTEBRAL ARTERY: Antegrade flow with normal waveform and velocity. LEFT CAROTID ARTERY: Mild amount of partially calcified plaque at the level of the carotid bulb and left ICA. Estimated left ICA stenosis is less than 50%. Some velocity elevation of the more  distal cervical segment of the left ICA appears to be in a tortuous segment. LEFT VERTEBRAL ARTERY: Antegrade flow with normal waveform and velocity. IMPRESSION: Plaque at the level of both carotid bulbs and proximal internal carotid arteries. No significant carotid stenosis identified with estimated bilateral ICA stenoses of less than 50%. Electronically Signed   By: Aletta Edouard M.D.   On: 10/24/2020 16:58   ECHOCARDIOGRAM COMPLETE  Result Date: 10/25/2020    ECHOCARDIOGRAM REPORT   Patient Name:   TRAMOND MAROTTE Date of Exam: 10/25/2020 Medical Rec #:  [id number]      Height:       69.0 in Accession #:    [id number]     Weight:        175.0 lb Date of Birth:  11/10/1950       BSA:          1.952 m Patient Age:    63 years       BP:           143/78 mmHg Patient Gender: M              HR:           61 bpm. Exam Location:  [facility] Procedure: 2D Echo, Cardiac Doppler and Color Doppler Indications:    Stroke l63.9  History:        Patient has prior history of Echocardiogram examinations, most                 recent 04/03/2019. Previous Myocardial Infarction; Risk                 Factors:Hypertension, Diabetes and Dyslipidemia. S/p aortic                 valve replacement ([employer] 23 mm pericardial tissue                 valve ) for severe AS.                 COVID-19.  Sonographer:    Alvino Chapel RCS Referring Phys: [place]  1. Left ventricular ejection fraction, by estimation, is 60 to 65%. The left ventricle has normal function. The left ventricle has no regional wall motion abnormalities. There is mild left ventricular hypertrophy. Left ventricular diastolic parameters are consistent with Grade I diastolic dysfunction (impaired relaxation). Elevated left atrial pressure.  2. Right ventricular systolic function is normal. The right ventricular size is normal. Tricuspid regurgitation signal is inadequate for assessing PA pressure.  3. The mitral valve is normal in structure. No evidence of mitral valve regurgitation. No evidence of mitral stenosis.  4. 23 mm Magna Ease bioprosthetic Aortic Valve is in the AV position. . The aortic valve has been repaired/replaced. There is mild calcification of the aortic valve. There is mild thickening of the aortic valve. Aortic valve regurgitation is not visualized. No aortic stenosis is present. FINDINGS  Left Ventricle: Left ventricular ejection fraction, by estimation, is 60 to 65%. The left ventricle has normal function. The left ventricle has no regional wall motion abnormalities. The left ventricular internal cavity size was normal in size. There is  mild left  ventricular hypertrophy. Left ventricular diastolic parameters are consistent with Grade I diastolic dysfunction (impaired relaxation). Elevated left atrial pressure. Right Ventricle: The right ventricular size is normal. No increase in right ventricular wall thickness. Right ventricular systolic function is normal. Tricuspid regurgitation signal is inadequate for assessing PA  pressure. Left Atrium: Left atrial size was normal in size. Right Atrium: Right atrial size was normal in size. Pericardium: There is no evidence of pericardial effusion. Mitral Valve: The mitral valve is normal in structure. There is mild thickening of the mitral valve leaflet(s). There is mild calcification of the mitral valve leaflet(s). Mild mitral annular calcification. No evidence of mitral valve regurgitation. No evidence of mitral valve stenosis. Tricuspid Valve: The tricuspid valve is normal in structure. Tricuspid valve regurgitation is mild . No evidence of tricuspid stenosis. Aortic Valve: 23 mm Magna Ease bioprosthetic Aortic Valve is in the AV position. The aortic valve has been repaired/replaced. There is mild calcification of the aortic valve. There is mild thickening of the aortic valve. There is mild aortic valve annular calcification. Aortic valve regurgitation is not visualized. No aortic stenosis is present. Aortic valve mean gradient measures 11.0 mmHg. Aortic valve peak gradient measures 20.6 mmHg. Aortic valve area, by VTI measures 1.12 cm. Pulmonic Valve: The pulmonic valve was not well visualized. Pulmonic valve regurgitation is not visualized. No evidence of pulmonic stenosis. Aorta: The aortic root is normal in size and structure. Venous: The inferior vena cava was not well visualized. IAS/Shunts: No atrial level shunt detected by color flow Doppler.  LEFT VENTRICLE PLAX 2D LVIDd:         4.40 cm  Diastology LVIDs:         2.10 cm  LV e' medial:    6.85 cm/s LV PW:         1.20 cm  LV E/e' medial:  16.1 LV IVS:         1.00 cm  LV e' lateral:   8.81 cm/s LVOT diam:     1.70 cm  LV E/e' lateral: 12.5 LV SV:         52 LV SV Index:   27 LVOT Area:     2.27 cm  RIGHT VENTRICLE RV S prime:     12.00 cm/s TAPSE (M-mode): 2.0 cm LEFT ATRIUM             Index       RIGHT ATRIUM           Index LA diam:        3.70 cm 1.90 cm/m  RA Area:     15.70 cm LA Vol (A2C):   48.0 ml 24.59 ml/m RA Volume:   34.80 ml  17.83 ml/m LA Vol (A4C):   44.4 ml 22.74 ml/m LA Biplane Vol: 50.7 ml 25.97 ml/m  AORTIC VALVE AV Area (Vmax):    1.05 cm AV Area (Vmean):   1.17 cm AV Area (VTI):     1.12 cm AV Vmax:           227.00 cm/s AV Vmean:          152.000 cm/s AV VTI:            0.464 m AV Peak Grad:      20.6 mmHg AV Mean Grad:      11.0 mmHg LVOT Vmax:         105.00 cm/s LVOT Vmean:        78.100 cm/s LVOT VTI:          0.228 m LVOT/AV VTI ratio: 0.49  AORTA Ao Root diam: 3.50 cm MITRAL VALVE MV Area (PHT): 1.72 cm     SHUNTS MV Decel Time: 440 msec     Systemic VTI:  0.23 m MV E velocity:  110.00 cm/s  Systemic Diam: 1.70 cm MV A velocity: 132.00 cm/s MV E/A ratio:  0.83 Carlyle Dolly MD Electronically signed by Carlyle Dolly MD Signature Date/Time: 10/25/2020/2:32:15 PM    Final     Scheduled Meds:  ascorbic acid  500 mg Oral Daily   aspirin  325 mg Oral Daily   atorvastatin  80 mg Oral q1800   cholecalciferol  5,000 Units Oral Daily   clopidogrel  75 mg Oral Daily   enoxaparin (LOVENOX) injection  40 mg Subcutaneous Q24H   ezetimibe  10 mg Oral Daily   insulin aspart  0-15 Units Subcutaneous TID WC   insulin aspart  0-5 Units Subcutaneous QHS   insulin glargine-yfgn  5 Units Subcutaneous QHS   metoprolol tartrate  25 mg Oral BID   pantoprazole  40 mg Oral Daily   sodium chloride flush  3 mL Intravenous Q12H   zinc sulfate  220 mg Oral Daily   Continuous Infusions:  sodium chloride 100 mL/hr (10/25/20 0408)   sodium chloride       LOS: 0 days    Time spent: 30 minutes  Barton Dubois, MD [facility]   To contact the attending provider between 7A-7P or the covering provider during after hours 7P-7A, please log into the web site [email or web address] and access using universal [place] password for that web site. If you do not have the password, please call the hospital operator.  10/25/2020, 4:52 PM

## 2020-10-25 NOTE — Evaluation (Signed)
Occupational Therapy Evaluation Patient Details Name: Darrell [name] MRN: [id number] DOB: 1950-05-06 Today's Date: 10/25/2020    History of Present Illness Darrell [name] is a 70 y.o. male with medical history significant of hypertension, hyperlipidemia, gastroesophageal reflux disease, type 2 diabetes mellitus, history of coronary disease/STEMI (s/p PCI and drug-eluting stent to LAD) and prior history of TIA/lacunar infarct; who presented to the emergency department secondary to right-sided weakness and ataxia.  Patient reports symptoms started around 10 PM mild vague on 10/23/2020 (last seen normal); and a progressive throughout and worsen when he woke up on the day of admission.  Patient expressed experiencing a mechanical fall while losing balance when standing and decided to seek medical attention at that time.  He denies any fever, chills, cough, hematuria, dysuria, hematochezia, melena, abdominal pain, headaches or any other focal deficits.  Patient also expressed no chest pain or shortness of breath.   Clinical Impression   Pt agreeable to OT/PT co-evaluation. Prior to hospitalization pt reports being independent for ADL's and IADL's. This date pt required Min G assist for functional transfers without RW. Pt demonstrates decreased coordination via slow functional ambulation with difficulty mobilizing R LE. Pt demonstrates WFL B UE strength and functional use. Primary deficit is ataxic movement during mobility. Pt also demonstrates deficits in postural stability with mild posterior lean seated at EOB. Pt will benefit from continued OT in the hospital and recommended venue below to increase strength, balance, and endurance for safe ADL's.       Follow Up Recommendations  CIR    Equipment Recommendations  None recommended by OT           Precautions / Restrictions Precautions Precautions: Fall Restrictions Weight Bearing Restrictions: No      Mobility Bed Mobility Overal bed  mobility: Needs Assistance Bed Mobility: Supine to Sit     Supine to sit: Supervision     General bed mobility comments: slow labored movement    Transfers Overall transfer level: Needs assistance   Transfers: Sit to/from Stand;Stand Pivot Transfers Sit to Stand: Min guard Stand pivot transfers: Min guard       General transfer comment: Pt demonstrates slow gait with more difficulty moving R LE during ambulation. Pt completed without RW due to not using RW at baseline.    Balance Overall balance assessment: Needs assistance Sitting-balance support: No upper extremity supported;Feet supported Sitting balance-Leahy Scale: Fair Sitting balance - Comments: seated at EOB Postural control: Posterior lean Standing balance support: No upper extremity supported;During functional activity Standing balance-Leahy Scale: Fair Standing balance comment: without RW                           ADL either performed or assessed with clinical judgement   ADL Overall ADL's : Needs assistance/impaired Eating/Feeding: Independent   Grooming: Standing;Min guard   Upper Body Bathing: Set up;Sitting   Lower Body Bathing: Set up;Sitting/lateral leans;Min guard;Sit to/from stand   Upper Body Dressing : Set up   Lower Body Dressing: Set up;Min guard;Sitting/lateral leans;Sit to/from stand   Toilet Transfer: Min guard;Stand-pivot;Ambulation   Toileting- Clothing Manipulation and Hygiene: Min guard;Sit to/from stand   Tub/ Shower Transfer: Min guard;Minimal assistance;Stand-pivot   Functional mobility during ADLs: Min guard General ADL Comments: Clinical judgement used to determine ADL levels. Pt required Min G assist for ambulatory transfer from EOB to chair due to unsteady slow gait without RW.     Vision Baseline Vision/History: 1 Wears glasses  Ability to See in Adequate Light: 0 Adequate Patient Visual Report: No change from baseline Vision Assessment?: No apparent visual  deficits                Pertinent Vitals/Pain Pain Assessment: 0-10 Pain Score: 4  Pain Location: low back and R clivicle area Pain Intervention(s): Limited activity within patient's tolerance;Monitored during session;Repositioned     Hand Dominance Right   Extremity/Trunk Assessment Upper Extremity Assessment Upper Extremity Assessment: Overall WFL for tasks assessed   Lower Extremity Assessment Lower Extremity Assessment: Defer to PT evaluation   Cervical / Trunk Assessment Cervical / Trunk Assessment: Kyphotic   Communication Communication Communication: No difficulties   Cognition Arousal/Alertness: Awake/alert Behavior During Therapy: WFL for tasks assessed/performed Overall Cognitive Status: Within Functional Limits for tasks assessed                                                      Home Living Family/patient expects to be discharged to:: Private residence Living Arrangements: Spouse/significant other Available Help at Discharge: Family;Available PRN/intermittently Type of Home: House Home Access: Stairs to enter Entrance Stairs-Number of Steps: 4 Entrance Stairs-Rails: Left (going up) Home Layout: Two level;Laundry or work area in Building surveyor of Steps: 8 to 10 Alternate Level Stairs-Rails: Right;Left;Can reach both Bathroom Shower/Tub: Teacher, early years/pre: Standard Bathroom Accessibility: Yes   Home Equipment: Environmental consultant - 2 wheels;Bedside commode          Prior Functioning/Environment Level of Independence: Independent        Comments: Pt reports being independent.        OT Problem List: Decreased activity tolerance;Impaired balance (sitting and/or standing);Decreased coordination      OT Treatment/Interventions: Self-care/ADL training;Therapeutic exercise;Neuromuscular education;DME and/or AE instruction;Therapeutic activities;Patient/family education;Balance training    OT  Goals(Current goals can be found in the care plan section) Acute Rehab OT Goals Patient Stated Goal: return home OT Goal Formulation: With patient Time For Goal Achievement: 11/08/20 Potential to Achieve Goals: Good  OT Frequency: Min 2X/week               Co-evaluation PT/OT/SLP Co-Evaluation/Treatment: Yes Reason for Co-Treatment: To address functional/ADL transfers   OT goals addressed during session: ADL's and self-care;Strengthening/ROM      AM-PAC OT "6 Clicks" Daily Activity     Outcome Measure Help from another person eating meals?: None Help from another person taking care of personal grooming?: A Little Help from another person toileting, which includes using toliet, bedpan, or urinal?: A Little Help from another person bathing (including washing, rinsing, drying)?: A Little Help from another person to put on and taking off regular upper body clothing?: A Little Help from another person to put on and taking off regular lower body clothing?: A Little 6 Click Score: 19   End of Session Equipment Utilized During Treatment: Gait belt  Activity Tolerance: Patient tolerated treatment well Patient left: in chair;with call bell/phone within reach  OT Visit Diagnosis: Unsteadiness on feet (R26.81);History of falling (Z91.81)                [id number] OT Time Calculation (min): 22 min Charges:  OT General Charges $OT Visit: 1 Visit OT Evaluation $OT Eval Low Complexity: 1 Low  [place] OT, MOT  Larey Seat 10/25/2020, 9:47 AM

## 2020-10-25 NOTE — Progress Notes (Signed)
   10/25/20 1800  What Happened  Was fall witnessed? No  Was patient injured? No  Patient found other (Comment) (Did not report fall until 1730 and fell in morning  Did not let staff know at the time.  Stated walked to toilet and dribbled urine on the way.  When he leaned over to clean it up he fell over)  Stated prior activity bathroom-unassisted  Follow Up  MD notified Barton Dubois  Time MD notified 1800  Adult Fall Risk Assessment  Risk Factor Category (scoring not indicated) History of more than one fall within 6 months before admission (document High fall risk)  Patient Fall Risk Level High fall risk  Adult Fall Risk Interventions  Required Bundle Interventions *See Row Information* High fall risk - low, moderate, and high requirements implemented  Additional Interventions Use of appropriate toileting equipment (bedpan, BSC, etc.)  Screening for Fall Injury Risk (To be completed on HIGH fall risk patients) - Assessing Need for Floor Mats  Risk For Fall Injury- Criteria for Floor Mats Admitted as a result of a fall  Pain Assessment  Pain Scale 0-10  Neurological  Neuro (WDL) WDL  Level of Consciousness Alert  Cognition Appropriate at baseline  Speech Clear  Musculoskeletal  Musculoskeletal (WDL) X  Assistive Device Front wheel walker  Weight Bearing Restrictions No  Integumentary  Integumentary (WDL) WDL

## 2020-10-25 NOTE — Progress Notes (Signed)
Inpatient Rehab Admissions Coordinator:   Consult received.  Note pt is observation status at this time and insurance may not approve CIR admission.  Will f/u with patient if he qualifies for inpatient status.    Shann Medal, PT, DPT Admissions Coordinator [phone] 10/25/20  12:36 PM

## 2020-10-25 NOTE — Progress Notes (Signed)
   10/25/20 1800  What Happened  Was fall witnessed? No  Was patient injured? No  Patient found other (Comment) (Did not report fall until 1730 and fell in morning  Did not let staff know at the time)  Stated prior activity bathroom-unassisted  Follow Up  MD notified Barton Dubois  Time MD notified 1800  Adult Fall Risk Assessment  Risk Factor Category (scoring not indicated) History of more than one fall within 6 months before admission (document High fall risk)  Patient Fall Risk Level High fall risk  Adult Fall Risk Interventions  Required Bundle Interventions *See Row Information* High fall risk - low, moderate, and high requirements implemented  Additional Interventions Use of appropriate toileting equipment (bedpan, BSC, etc.)  Screening for Fall Injury Risk (To be completed on HIGH fall risk patients) - Assessing Need for Floor Mats  Risk For Fall Injury- Criteria for Floor Mats Admitted as a result of a fall

## 2020-10-25 NOTE — Progress Notes (Signed)
*  PRELIMINARY RESULTS* Echocardiogram 2D Echocardiogram has been performed.  Darrell [name] 10/25/2020, 1:52 PM

## 2020-10-26 ENCOUNTER — Encounter (HOSPITAL_COMMUNITY): Payer: Self-pay | Admitting: Internal Medicine

## 2020-10-26 LAB — GLUCOSE, CAPILLARY
Glucose-Capillary: 159 mg/dL — ABNORMAL HIGH (ref 70–99)
Glucose-Capillary: 229 mg/dL — ABNORMAL HIGH (ref 70–99)
Glucose-Capillary: 127 mg/dL — ABNORMAL HIGH (ref 70–99)
Glucose-Capillary: 371 mg/dL — ABNORMAL HIGH (ref 70–99)

## 2020-10-26 NOTE — Progress Notes (Signed)
Inpatient Rehab Admissions Coordinator:   I spoke to patient over the phone for CIR assessment and to explain goals/expectations of CIR stay.  He is open to CIR, if needed. I explained probable length of stay less than 1 week, and goals of modified independence (requiring increased time or an assistive device to complete a task).  Pt states his wife is home during the can and can provide some assist if needed.  I let him know that we would need to pursue insurance authorization, and with holiday weekend may not hear back until Tuesday.  We discussed that he progressed very well between yesterday and today, and if he continues to progress and feels he is safe to d/c home (and if wife agrees), then he is free to do so and I can withdraw insurance request.  I will start insurance today and will f/u with him on Monday.    Shann Medal, PT, DPT Admissions Coordinator [phone] 10/26/20  11:23 AM

## 2020-10-26 NOTE — Progress Notes (Signed)
Occupational Therapy Treatment Patient Details Name: Darrell [name] MRN: [id number] DOB: [birth year] Today's Date: 10/26/2020    History of present illness Darrell [name] is a 70 y.o. male with medical history significant of hypertension, hyperlipidemia, gastroesophageal reflux disease, type 2 diabetes mellitus, history of coronary disease/STEMI (s/p PCI and drug-eluting stent to LAD) and prior history of TIA/lacunar infarct; who presented to the emergency department secondary to right-sided weakness and ataxia.  Patient reports symptoms started around 10 PM mild vague on 10/23/2020 (last seen normal); and a progressive throughout and worsen when he woke up on the day of admission.  Patient expressed experiencing a mechanical fall while losing balance when standing and decided to seek medical attention at that time.  He denies any fever, chills, cough, hematuria, dysuria, hematochezia, melena, abdominal pain, headaches or any other focal deficits.  Patient also expressed no chest pain or shortness of breath.   OT comments  Pt agreeable to OT treatment. Pt able to complete bed mobility with SPV with mild slow labored movement. Pt was able to complete transfer from EOB to sink with fair balance with noted leaning on furniture during transfer with L UE. Once at the sink the pt completed grooming tasks with SPV to min G assist for safety. Pt also complete transfer from standing at sink to toilet and back with Min G assist. Pt able to complete peri-care with SPV to min G assist when standing. Pt reports feeling like his balance has improved. Pt will benefit from continued OT in the hospital and recommended venue below to increase strength, balance, and endurance for safe ADL's.     Follow Up Recommendations  CIR    Equipment Recommendations  None recommended by OT          Precautions / Restrictions Precautions Precautions: Fall Restrictions Weight Bearing Restrictions: No       Mobility Bed  Mobility Overal bed mobility: Needs Assistance Bed Mobility: Supine to Sit     Supine to sit: Supervision     General bed mobility comments: slow labored movement    Transfers Overall transfer level: Needs assistance   Transfers: Sit to/from Stand;Stand Pivot Transfers Sit to Stand: Min guard Stand pivot transfers: Min guard       General transfer comment: Pt noted to lean on furniture during ambulatory transfer to standing at sink.    Balance Overall balance assessment: Needs assistance Sitting-balance support: Feet supported;No upper extremity supported Sitting balance-Leahy Scale: Good Sitting balance - Comments: seated at EOB   Standing balance support: During functional activity;No upper extremity supported Standing balance-Leahy Scale: Fair Standing balance comment: without AD                           ADL either performed or assessed with clinical judgement   ADL Overall ADL's : Needs assistance/impaired     Grooming: Min guard;Standing;Wash/dry hands;Applying deodorant Grooming Details (indicate cue type and reason): completed standing at sink                 Toilet Transfer: Min guard;Stand-pivot;Ambulation Toilet Transfer Details (indicate cue type and reason): ambulatory transfer to toilet from standing at sink [place] and Hygiene: Supervision/safety;Sitting/lateral lean;Sit to/from stand Toileting - Clothing Manipulation Details (indicate cue type and reason): Pt able to complete peri-care without physical assist. Able to pull pants down and up with SPV.  Cognition Arousal/Alertness: Awake/alert (Simultaneous filing. User may not have seen previous data.) Behavior During Therapy: [facility] for tasks assessed/performed (Simultaneous filing. User may not have seen previous data.) Overall Cognitive Status: Within Functional Limits for tasks assessed (Simultaneous filing. User may  not have seen previous data.)                                                            Pertinent Vitals/ Pain       Pain Assessment: No/denies pain                                                           Frequency  Min 2X/week        Progress Toward Goals  OT Goals(current goals can now be found in the care plan section)  Progress towards OT goals: Progressing toward goals  Acute Rehab OT Goals Patient Stated Goal: return home OT Goal Formulation: With patient Time For Goal Achievement: 11/08/20 Potential to Achieve Goals: Good ADL Goals Pt Will Perform Grooming: Independently;standing Pt Will Perform Upper Body Dressing: Independently;sitting;standing Pt Will Perform Lower Body Dressing: Independently;sit to/from stand;sitting/lateral leans Pt Will Transfer to Toilet: Independently;ambulating  Plan Discharge plan remains appropriate                                    End of Session    OT Visit Diagnosis: Unsteadiness on feet (R26.81);History of falling (Z91.81)   Activity Tolerance Patient tolerated treatment well   Patient Left with call bell/phone within reach;in bed;with bed alarm set             Time: [id number] OT Time Calculation (min): 19 min  Charges: OT General Charges $OT Visit: 1 Visit OT Treatments $Self Care/Home Management : 8-22 mins  Brittian Renaldo OT, MOT   Larey Seat 10/26/2020, 9:55 AM

## 2020-10-26 NOTE — Progress Notes (Signed)
PROGRESS NOTE    Darrell [name]  [id number] DOB: Jun 02, 1950 DOA: 10/24/2020 PCP: Celene Squibb, MD   Chief Complaint  Patient presents with   Weakness    Brief admission Narrative:  Darrell [name] is a 70 y.o. male with medical history significant of hypertension, hyperlipidemia, gastroesophageal reflux disease, type 2 diabetes mellitus, history of coronary disease/STEMI (s/p PCI and drug-eluting stent to LAD) and prior history of TIA/lacunar infarct; who presented to the emergency department secondary to right-sided weakness and ataxia.  Patient reports symptoms started around 10 PM mild vague on 10/23/2020 (last seen normal); and a progressive throughout and worsen when he woke up on the day of admission.  Patient expressed experiencing a mechanical fall while losing balance when standing and decided to seek medical attention at that time.  He denies any fever, chills, cough, hematuria, dysuria, hematochezia, melena, abdominal pain, headaches or any other focal deficits.  Patient also expressed no chest pain or shortness of breath.   Of note, patient is now vaccinated against COVID, COVID PCR in the ED negative.  Reported no sick contacts.  Assessment & Plan: 1-acute left syndrome same evaluate ischemic infarct -2D echo not demonstrating abnormalities -Telemetry no revealing arrhythmias -Carotid ultrasound demonstrating plaque at the level of both carotid bulbs and proximal internal carotid arteries no significant carotid stenosis identified with estimated bilateral ICA stenosis of less than 50%. -Follow recommendation by neurology service dual antiplatelet therapy for 1 month (aspirin and Plavix); continue 162 mg aspirin on daily basis for secondary prevention after that. -Continue good control of patient's blood sugar, blood pressure and cholesterol level, for risk factor modifications. -PT/OT recommending CIR; insurance authorization requested; will follow progress and if further  improvement achieved, might be able to go home with [facility] services.   2-HTN -Stable overall -Continue allowing for permissive hypertension. -Heart healthy diet encouraged.  3-hyperlipidemia -LDL 116 -Continue Lipitor 80 mg daily and continue Zetia 10 mg daily. -Heart healthy diet encouraged.  4-poorly controlled type 2 diabetes mellitus -A1c 8.9 -Continue sliding scale insulin and Levemir -Follow CBGs -Patient will require further adjustment of hypoglycemic regimen as an outpatient according to achieve better control A1c less than 7. -Modify carbohydrate diet encouraged.  5-history of coronary disease -Continue aspirin, statins and beta-blocker -No chest pain or shortness of breath -Continue outpatient follow-up with cardiology service.  6-vitamin D deficiency -Continue supplementation.    DVT prophylaxis: Lovenox Code Status: Full code Family Communication: No family at bedside during today's evaluation. Disposition:   Status is: Inpatient    Dispo: The patient is from: Home              Anticipated d/c is to: CIR              Patient currently is medically stable to d/c.   Difficult to place patient No    Consultants:  Neurology service  Procedures:  See below for x-ray and echocardiogram reports.  Antimicrobials:  None  Subjective: No fever, no chest pain, no nausea, no vomiting, no shortness of breath.  Patient reports feeling improved on his right side weakness and expressed less associated dizziness with mobility.  Objective: Vitals:   10/25/20 1135 10/25/20 1600 10/25/20 2000 10/26/20 0600  BP:  (!) 153/79 128/86 128/80  Pulse:  69 79 72  Resp:  '16 18 18  '$ Temp:  97.9 F (36.6 C) 98.2 F (36.8 C) 98.1 F (36.7 C)  TempSrc:   Oral Oral  SpO2: 97% 100% 97% 97%  Weight:      Height:        Intake/Output Summary (Last 24 hours) at 10/26/2020 1648 Last data filed at 10/26/2020 1500 Gross per 24 hour  Intake 1360 ml  Output --  Net 1360 ml    Filed Weights   10/24/20 1824 10/25/20 0500  Weight: 82 kg 79.4 kg    Examination: General exam: Alert, awake, oriented x 3, reports feeling better and less dizzy today.  No chest pain, no nausea, no vomiting, no palpitations, no shortness of breath. Respiratory system: Clear to auscultation. Respiratory effort normal.  Good saturation on room air; no using accessory muscle. Cardiovascular system: Rate controlled, no rubs, no gallops, no JVD. Gastrointestinal system: Abdomen is nondistended, soft and nontender. No organomegaly or masses felt. Normal bowel sounds heard. Central nervous system: Alert and oriented. No new focal neurological deficits. Extremities: No cyanosis or clubbing. Skin: No petechiae. Psychiatry: Judgement and insight appear normal. Mood & affect appropriate.    Data Reviewed: I have personally reviewed following labs and imaging studies  CBC: Recent Labs  Lab 10/24/20 0957 10/24/20 1019 10/25/20 0550  WBC 6.8  --  7.5  NEUTROABS 4.1  --   --   HGB 16.1 16.3 15.4  HCT 46.4 48.0 44.8  MCV 96.7  --  98.7  PLT 133*  --  125*    Basic Metabolic Panel: Recent Labs  Lab 10/24/20 0957 10/24/20 1003 10/24/20 1019 10/25/20 0550  NA 136  --  140 137  K 3.9  --  4.2 4.5  CL 106  --  105 105  CO2 23  --   --  27  GLUCOSE 187*  --  182* 136*  BUN 17  --  17 16  CREATININE 0.73  --  0.80 0.72  CALCIUM 8.8*  --   --  8.4*  MG  --  1.9  --   --     GFR: Estimated Creatinine Clearance: 83.5 mL/min (by C-G formula based on SCr of 0.72 mg/dL).  Liver Function Tests: Recent Labs  Lab 10/24/20 0957  AST 24  ALT 42  ALKPHOS 56  BILITOT 0.7  PROT 6.7  ALBUMIN 3.9   CBG: Recent Labs  Lab 10/25/20 1116 10/25/20 1640 10/25/20 2217 10/26/20 0739 10/26/20 1140  GLUCAP 221* 134* 179* 159* 229*    Recent Results (from the past 240 hour(s))  Resp Panel by RT-PCR (Flu A&B, Covid) Nasopharyngeal Swab     Status: None   Collection Time: 10/24/20  10:19 AM   Specimen: Nasopharyngeal Swab; Nasopharyngeal(NP) swabs in vial transport medium  Result Value Ref Range Status   SARS Coronavirus 2 by RT PCR NEGATIVE NEGATIVE Final    Comment: (NOTE) SARS-CoV-2 target nucleic acids are NOT DETECTED.  The SARS-CoV-2 RNA is generally detectable in upper respiratory specimens during the acute phase of infection. The lowest concentration of SARS-CoV-2 viral copies this assay can detect is 138 copies/mL. A negative result does not preclude SARS-Cov-2 infection and should not be used as the sole basis for treatment or other patient management decisions. A negative result may occur with  improper specimen collection/handling, submission of specimen other than nasopharyngeal swab, presence of viral mutation(s) within the areas targeted by this assay, and inadequate number of viral copies(<138 copies/mL). A negative result must be combined with clinical observations, patient history, and epidemiological information. The expected result is Negative.  Fact Sheet for Patients:  EntrepreneurPulse.com.au  Fact Sheet for Healthcare Providers:  IncredibleEmployment.be  This  test is no t yet approved or cleared by the [place] and  has been authorized for detection and/or diagnosis of SARS-CoV-2 by FDA under an Emergency Use Authorization (EUA). This EUA will remain  in effect (meaning this test can be used) for the duration of the COVID-19 declaration under Section 564(b)(1) of the Act, 21 U.S.C.section 360bbb-3(b)(1), unless the authorization is terminated  or revoked sooner.       Influenza A by PCR NEGATIVE NEGATIVE Final   Influenza B by PCR NEGATIVE NEGATIVE Final    Comment: (NOTE) The Xpert Xpress SARS-CoV-2/FLU/RSV plus assay is intended as an aid in the diagnosis of influenza from Nasopharyngeal swab specimens and should not be used as a sole basis for treatment. Nasal washings and aspirates  are unacceptable for Xpert Xpress SARS-CoV-2/FLU/RSV testing.  Fact Sheet for Patients: EntrepreneurPulse.com.au  Fact Sheet for Healthcare Providers: IncredibleEmployment.be  This test is not yet approved or cleared by the [place] FDA and has been authorized for detection and/or diagnosis of SARS-CoV-2 by FDA under an Emergency Use Authorization (EUA). This EUA will remain in effect (meaning this test can be used) for the duration of the COVID-19 declaration under Section 564(b)(1) of the Act, 21 U.S.C. section 360bbb-3(b)(1), unless the authorization is terminated or revoked.  Performed at [facility], [address] [place], [place] [address]     Radiology Studies: ECHOCARDIOGRAM COMPLETE  Result Date: 10/25/2020    ECHOCARDIOGRAM REPORT   Patient Name:   Darrell [name] Date of Exam: 10/25/2020 Medical Rec #:  [id number]      Height:       69.0 in Accession #:    [id number]     Weight:       175.0 lb Date of Birth:  23-May-1950       BSA:          1.952 m Patient Age:    62 years       BP:           143/78 mmHg Patient Gender: M              HR:           61 bpm. Exam Location:  [facility] Procedure: 2D Echo, Cardiac Doppler and Color Doppler Indications:    Stroke l63.9  History:        Patient has prior history of Echocardiogram examinations, most                 recent 04/03/2019. Previous Myocardial Infarction; Risk                 Factors:Hypertension, Diabetes and Dyslipidemia. S/p aortic                 valve replacement ([employer] 23 mm pericardial tissue                 valve ) for severe AS.                 COVID-19.  Sonographer:    Alvino Chapel RCS Referring Phys: Stewart  1. Left ventricular ejection fraction, by estimation, is 60 to 65%. The left ventricle has normal function. The left ventricle has no regional wall motion abnormalities. There is mild left ventricular hypertrophy. Left ventricular diastolic  parameters are consistent with Grade I diastolic dysfunction (impaired relaxation). Elevated left atrial pressure.  2. Right ventricular systolic function is normal. The right ventricular size is  normal. Tricuspid regurgitation signal is inadequate for assessing PA pressure.  3. The mitral valve is normal in structure. No evidence of mitral valve regurgitation. No evidence of mitral stenosis.  4. 23 mm Magna Ease bioprosthetic Aortic Valve is in the AV position. . The aortic valve has been repaired/replaced. There is mild calcification of the aortic valve. There is mild thickening of the aortic valve. Aortic valve regurgitation is not visualized. No aortic stenosis is present. FINDINGS  Left Ventricle: Left ventricular ejection fraction, by estimation, is 60 to 65%. The left ventricle has normal function. The left ventricle has no regional wall motion abnormalities. The left ventricular internal cavity size was normal in size. There is  mild left ventricular hypertrophy. Left ventricular diastolic parameters are consistent with Grade I diastolic dysfunction (impaired relaxation). Elevated left atrial pressure. Right Ventricle: The right ventricular size is normal. No increase in right ventricular wall thickness. Right ventricular systolic function is normal. Tricuspid regurgitation signal is inadequate for assessing PA pressure. Left Atrium: Left atrial size was normal in size. Right Atrium: Right atrial size was normal in size. Pericardium: There is no evidence of pericardial effusion. Mitral Valve: The mitral valve is normal in structure. There is mild thickening of the mitral valve leaflet(s). There is mild calcification of the mitral valve leaflet(s). Mild mitral annular calcification. No evidence of mitral valve regurgitation. No evidence of mitral valve stenosis. Tricuspid Valve: The tricuspid valve is normal in structure. Tricuspid valve regurgitation is mild . No evidence of tricuspid stenosis. Aortic Valve:  23 mm Magna Ease bioprosthetic Aortic Valve is in the AV position. The aortic valve has been repaired/replaced. There is mild calcification of the aortic valve. There is mild thickening of the aortic valve. There is mild aortic valve annular calcification. Aortic valve regurgitation is not visualized. No aortic stenosis is present. Aortic valve mean gradient measures 11.0 mmHg. Aortic valve peak gradient measures 20.6 mmHg. Aortic valve area, by VTI measures 1.12 cm. Pulmonic Valve: The pulmonic valve was not well visualized. Pulmonic valve regurgitation is not visualized. No evidence of pulmonic stenosis. Aorta: The aortic root is normal in size and structure. Venous: The inferior vena cava was not well visualized. IAS/Shunts: No atrial level shunt detected by color flow Doppler.  LEFT VENTRICLE PLAX 2D LVIDd:         4.40 cm  Diastology LVIDs:         2.10 cm  LV e' medial:    6.85 cm/s LV PW:         1.20 cm  LV E/e' medial:  16.1 LV IVS:        1.00 cm  LV e' lateral:   8.81 cm/s LVOT diam:     1.70 cm  LV E/e' lateral: 12.5 LV SV:         52 LV SV Index:   27 LVOT Area:     2.27 cm  RIGHT VENTRICLE RV S prime:     12.00 cm/s TAPSE (M-mode): 2.0 cm LEFT ATRIUM             Index       RIGHT ATRIUM           Index LA diam:        3.70 cm 1.90 cm/m  RA Area:     15.70 cm LA Vol (A2C):   48.0 ml 24.59 ml/m RA Volume:   34.80 ml  17.83 ml/m LA Vol (A4C):   44.4 ml 22.74 ml/m LA Biplane  Vol: 50.7 ml 25.97 ml/m  AORTIC VALVE AV Area (Vmax):    1.05 cm AV Area (Vmean):   1.17 cm AV Area (VTI):     1.12 cm AV Vmax:           227.00 cm/s AV Vmean:          152.000 cm/s AV VTI:            0.464 m AV Peak Grad:      20.6 mmHg AV Mean Grad:      11.0 mmHg LVOT Vmax:         105.00 cm/s LVOT Vmean:        78.100 cm/s LVOT VTI:          0.228 m LVOT/AV VTI ratio: 0.49  AORTA Ao Root diam: 3.50 cm MITRAL VALVE MV Area (PHT): 1.72 cm     SHUNTS MV Decel Time: 440 msec     Systemic VTI:  0.23 m MV E velocity: 110.00  cm/s  Systemic Diam: 1.70 cm MV A velocity: 132.00 cm/s MV E/A ratio:  0.83 Carlyle Dolly MD Electronically signed by Carlyle Dolly MD Signature Date/Time: 10/25/2020/2:32:15 PM    Final     Scheduled Meds:  ascorbic acid  500 mg Oral Daily   aspirin  325 mg Oral Daily   atorvastatin  80 mg Oral q1800   cholecalciferol  5,000 Units Oral Daily   clopidogrel  75 mg Oral Daily   enoxaparin (LOVENOX) injection  40 mg Subcutaneous Q24H   ezetimibe  10 mg Oral Daily   insulin aspart  0-15 Units Subcutaneous TID WC   insulin aspart  0-5 Units Subcutaneous QHS   insulin glargine-yfgn  5 Units Subcutaneous QHS   metoprolol tartrate  25 mg Oral BID   pantoprazole  40 mg Oral Daily   sodium chloride flush  3 mL Intravenous Q12H   zinc sulfate  220 mg Oral Daily   Continuous Infusions:  sodium chloride 100 mL/hr (10/25/20 0408)   sodium chloride       LOS: 1 day    Time spent: 30 minutes  Barton Dubois, MD [facility]   To contact the attending provider between 7A-7P or the covering provider during after hours 7P-7A, please log into the web site [email or web address] and access using universal [place] password for that web site. If you do not have the password, please call the hospital operator.  10/26/2020, 4:48 PM

## 2020-10-26 NOTE — Care Management Important Message (Signed)
Important Message  Patient Details  Name: Darrell [name] MRN: [id number] Date of Birth: 1950-03-14   Medicare Important Message Given:  Yes     Tommy Medal 10/26/2020, 12:42 PM

## 2020-10-26 NOTE — Evaluation (Signed)
Speech Language Pathology Evaluation Patient Details Name: Darrell [name] MRN: [id number] DOB: [birth year] Today's Date: 10/26/2020 Time: [id number] SLP Time Calculation (min) (ACUTE ONLY): 26 min  Problem List:  Patient Active Problem List   Diagnosis Date Noted   Acute stroke due to ischemia ([place]) 10/25/2020   Stroke-like symptoms 10/24/2020   Bilateral inguinal hernia without obstruction or gangrene 03/27/2020   Hyperlipidemia associated with type 2 diabetes mellitus ([place])    Diabetes mellitus due to underlying condition, uncontrolled, with hypoglycemia without coma ([place])    Acute ST elevation myocardial infarction (STEMI) involving left anterior descending (LAD) coronary artery ([place]) 04/02/2019   ST elevation myocardial infarction involving left anterior descending (LAD) coronary artery ([place]) 04/02/2019   Vitamin D deficiency 03/13/2015   DDD (degenerative disc disease), lumbar 04/01/2012   Degenerative disc disease, lumbar 03/24/2012   MRSA cellulitis 10/20/2011   S/P AVR (aortic valve replacement) - for severe AS;  08/05/2011   GERD (gastroesophageal reflux disease)    Insomnia    Type 2 diabetes mellitus ([place])     Class: Diagnosis of   History of BPH    ADENOMATOUS COLONIC POLYP 07/08/2007   HYPERLIPIDEMIA 07/08/2007    Class: Diagnosis of   DEPRESSION 07/08/2007   Essential hypertension 07/08/2007    Class: Diagnosis of   DIVERTICULOSIS 07/08/2007   ABDOMINAL PAIN-LUQ 07/08/2007   Past Medical History:  Past Medical History:  Diagnosis Date   Aortic stenosis 05/2011   Severe by recent echo; AoV area ~0.77-79 cm2, peak /mean gradient 86 mmHg/ 55 mmHg.   Arthritis    back- low- [facility]- workmen's comp. situation    DDD (degenerative disc disease), lumbosacral    Depression    quick temper- per pt.    Diverticulosis    slight -per colonoscopy, pt. asymptomatic- thus far   GERD (gastroesophageal reflux disease)    Hiatal hernia    History of BPH    History of  MRSA infection    Hyperlipidemia    Hypertension    [facility]- Dr. Ellyn Hack    Insomnia    Myocardial infarction [facility])    By report only, cardiac catheterization was negative for ischemia.   NIDDM (non-insulin dependent diabetes mellitus)    Type 2 NIDDM x 5 years   Prostate disease    Recurrent upper respiratory infection (URI)    cold- curently- "per pt., on the mend"   S/P AVR (aortic valve replacement) 08/05/2011   Preop cath showed normal coronaries; AVR--Dr. Servando Snare, MD; 23 mm Magna Ease bioprosthetic Aortic Valve   STEMI (ST elevation myocardial infarction) ([place])    PCI/DES x1 to mLAD   Past Surgical History:  Past Surgical History:  Procedure Laterality Date   ABDOMINAL EXPOSURE N/A 04/01/2012   Procedure: ABDOMINAL EXPOSURE;  Surgeon: Angelia Mould, MD;  Location: [place];  Service: Vascular;  Laterality: N/A;   ANTERIOR LUMBAR FUSION N/A 04/01/2012   Procedure: ANTERIOR LUMBAR FUSION 1 LEVEL;  Surgeon: [name], MD;  Location: [place];  Service: Orthopedics;  Laterality: N/A;  ALIF L5-S1   AORTIC VALVE REPLACEMENT  08/05/2011   Procedure: AORTIC VALVE REPLACEMENT (AVR);  Surgeon: [name], MD;  Location: [place];  Service: Open Heart Surgery;  Laterality: N/A;   CARDIAC CATHETERIZATION  06/2011    Minimal coronary disease   CORONARY/GRAFT ACUTE MI REVASCULARIZATION N/A 04/02/2019   Procedure: Coronary/Graft Acute MI Revascularization;  Surgeon: [name], MD;  Location: [place] CV LAB;  Service: Cardiovascular;  Laterality:  N/A;   LEFT AND RIGHT HEART CATHETERIZATION WITH CORONARY ANGIOGRAM  07/03/2011   Procedure: LEFT AND RIGHT HEART CATHETERIZATION WITH CORONARY ANGIOGRAM;  Surgeon: [name], MD;  Location: [place] CATH LAB;  Service: Cardiovascular;;   LEFT HEART CATH AND CORONARY ANGIOGRAPHY N/A 04/02/2019   Procedure: LEFT HEART CATH AND CORONARY ANGIOGRAPHY;  Surgeon: [name], MD;  Location: [place] CV LAB;  Service: Cardiovascular;   Laterality: N/A;   LUMBAR FUSION  04/02/2012   Dr [name]   NASAL SEPTUM SURGERY     [facility] 10 yrs. ago   [facility]  April 2013   Low risk.   HPI:  Darrell [name] is a 70 y.o. male with medical history significant of hypertension, hyperlipidemia, gastroesophageal reflux disease, type 2 diabetes mellitus, history of coronary disease/STEMI (s/p PCI and drug-eluting stent to LAD) and prior history of TIA/lacunar infarct; who presented to the emergency department secondary to right-sided weakness and ataxia.  Patient reports symptoms started around 10 PM mild vague on 10/23/2020 (last seen normal); and a progressive throughout and worsen when he woke up on the day of admission.  Patient expressed experiencing a mechanical fall while losing balance when standing and decided to seek medical attention at that time.  He denies any fever, chills, cough, hematuria, dysuria, hematochezia, melena, abdominal pain, headaches or any other focal deficits. MRI reveals "Small acute infarct in the left centrum semiovale."   Assessment / Plan / Recommendation Clinical Impression  Speech Language evaluation completed; Pt presents with mild cognitive impairment that is reportedly baseline (some difficulty with memory) with Pt achieving a score of 19/30 on the [facility]. Areas of need are short term memory and thought organization (impairment in both areas noted to be mild). Awareness, attention, orientation are all intact.  Motor speech and Verbal expression are intact. It is difficult to assess if mild memory impairment is new; Per Pt his cognitive functioning is at baseline but Pt may benefit from more in depth cognitive assessment at CIR and treatment as indicated to ensure safety and ability to maintain independence upon d/c. There are no further needs noted here in acute. Thank you.    SLP Assessment  SLP Recommendation/Assessment: All further Speech Lanaguage Pathology  needs can be addressed in the  next venue of care SLP Visit Diagnosis: Cognitive communication deficit (R41.841)    Follow Up Recommendations  Inpatient Rehab    Frequency and Duration           SLP Evaluation Cognition  Overall Cognitive Status: Within Functional Limits for tasks assessed Arousal/Alertness: Awake/alert Orientation Level: Oriented X4 Attention: Focused Focused Attention: Appears intact Memory: Impaired Memory Impairment: Decreased short term memory Decreased Short Term Memory: Verbal basic Awareness: Appears intact       Comprehension       Expression Verbal Expression Overall Verbal Expression: Appears within functional limits for tasks assessed   Oral / Motor  Oral Motor/Sensory Function Overall Oral Motor/Sensory Function: Within functional limits Motor Speech Overall Motor Speech: Appears within functional limits for tasks assessed     Diane Hanel H. Roddie Mc, CCC-SLP Speech Language Pathologist         Wende Bushy 10/26/2020, 3:29 PM

## 2020-10-26 NOTE — Progress Notes (Signed)
Physical Therapy Treatment Patient Details Name: Darrell [name] MRN: [id number] DOB: 05/19/1950 Today's Date: 10/26/2020    History of Present Illness Darrell FALLO is a 70 y.o. male with medical history significant of hypertension, hyperlipidemia, gastroesophageal reflux disease, type 2 diabetes mellitus, history of coronary disease/STEMI (s/p PCI and drug-eluting stent to LAD) and prior history of TIA/lacunar infarct; who presented to the emergency department secondary to right-sided weakness and ataxia.  Patient reports symptoms started around 10 PM mild vague on 10/23/2020 (last seen normal); and a progressive throughout and worsen when he woke up on the day of admission.  Patient expressed experiencing a mechanical [name] while losing balance when standing and decided to seek medical attention at that time.  He denies any fever, chills, cough, hematuria, dysuria, hematochezia, melena, abdominal pain, headaches or any other focal deficits.  Patient also expressed no chest pain or shortness of breath.    PT Comments    Patient agreeable for therapy and states he had a [name] in the bathroom since last visit when leaning over trying to clean floor due to incontinent  of stool, hit head and right hip and c/o mild soreness in right hip, otherwise mostly having usual arthritic pain in joints.  Patient demonstrates labored movement for sitting up at bedside, good return for completing BLE exercises, has fair trunk control, but falls backwards once fatigued.  Patient loses balance and falls onto bed when attempting standing marching in place without AD or using cane, no loss of balance when using RW, able to ambulate in hallway with slow labored cadence having to hike right hip to clear toes without AD, more stable using SPC with mostly step-to pattern without loss of balance, but fatigues easily due to c/o RLE weakness.  Patient tolerated sitting up in chair after therapy - RN notified.  Patient will benefit from  continued physical therapy in hospital and recommended venue below to increase strength, balance, endurance for safe ADLs and gait.    Follow Up Recommendations  CIR;Supervision for mobility/OOB;Supervision - Intermittent     Equipment Recommendations  None recommended by PT    Recommendations for Other Services       Precautions / Restrictions Precautions Precautions: [name] Restrictions Weight Bearing Restrictions: No    Mobility  Bed Mobility Overal bed mobility: Needs Assistance (Simultaneous filing. User may not have seen previous data.) Bed Mobility: Supine to Sit (Simultaneous filing. User may not have seen previous data.)     Supine to sit: Supervision (Simultaneous filing. User may not have seen previous data.)     General bed mobility comments: increased time, labored movement (Simultaneous filing. User may not have seen previous data.)    Transfers Overall transfer level: Needs assistance (Simultaneous filing. User may not have seen previous data.) Equipment used: None;Straight cane Transfers: Sit to/from [employer] (Simultaneous filing. User may not have seen previous data.) Sit to Stand: Min guard (Simultaneous filing. User may not have seen previous data.) Stand pivot transfers: Min guard (Simultaneous filing. User may not have seen previous data.)       General transfer comment: slow labored movement (Simultaneous filing. User may not have seen previous data.)  Ambulation/Gait Ambulation/Gait assistance: Min guard Gait Distance (Feet): 75 Feet Assistive device: Straight cane;None Gait Pattern/deviations: Decreased step length - right;Decreased step length - left;Decreased stance time - right;Decreased stride length;Decreased dorsiflexion - right;Trunk flexed Gait velocity: decreased   General Gait Details: has difficulty advancing RLE due to decreased dorsiflexion having to hike  right hip demonstrating slow labored unsteady cadence,  slightly improvement using SPC with mostly step-through pattern without loss of balance, but fatigues easily due to c/o RLE weakness   Stairs             Wheelchair Mobility    Modified Rankin (Stroke Patients Only)       Balance Overall balance assessment: Needs assistance Sitting-balance support: Feet supported Sitting balance-Leahy Scale: Fair Sitting balance - Comments: fair/good seated at EOB Postural control: Posterior lean Standing balance support: During functional activity;No upper extremity supported Standing balance-Leahy Scale: Fair Standing balance comment: fair using SPC, fair/good using RW                            Cognition Arousal/Alertness: Awake/alert (Simultaneous filing. User may not have seen previous data.) Behavior During Therapy: [facility] for tasks assessed/performed (Simultaneous filing. User may not have seen previous data.) Overall Cognitive Status: Within Functional Limits for tasks assessed (Simultaneous filing. User may not have seen previous data.)                                        Exercises General Exercises - Lower Extremity Long Arc Quad: Seated;AROM;Strengthening;Both;15 reps Hip Flexion/Marching: Seated;AROM;Strengthening;Both;15 reps;Standing;10 reps Toe Raises: Seated;AROM;Strengthening;Both;15 reps Heel Raises: Seated;AROM;Strengthening;Both;15 reps    General Comments        Pertinent Vitals/Pain Pain Assessment: No/denies pain Faces Pain Scale: Hurts a little bit Pain Location: low back, soreness right hip and chronic arthritis pain in neck Pain Descriptors / Indicators: Sore;Discomfort Pain Intervention(s): Limited activity within patient's tolerance;Monitored during session;Repositioned    Home Living                      Prior Function            PT Goals (current goals can now be found in the care plan section) Acute Rehab PT Goals Patient Stated Goal: return home PT Goal  Formulation: With patient Time For Goal Achievement: 11/08/20 Potential to Achieve Goals: Good Progress towards PT goals: Progressing toward goals    Frequency    Min 5X/week      PT Plan Current plan remains appropriate    Co-evaluation              AM-PAC PT "6 Clicks" Mobility   Outcome Measure  Help needed turning from your back to your side while in a flat bed without using bedrails?: None Help needed moving from lying on your back to sitting on the side of a flat bed without using bedrails?: A Little Help needed moving to and from a bed to a chair (including a wheelchair)?: A Little Help needed standing up from a chair using your arms (e.g., wheelchair or bedside chair)?: A Little Help needed to walk in hospital room?: A Little Help needed climbing 3-5 steps with a railing? : A Little 6 Click Score: 19    End of Session   Activity Tolerance: Patient tolerated treatment well;Patient limited by fatigue Patient left: in chair;with call bell/phone within reach Nurse Communication: Mobility status PT Visit Diagnosis: Unsteadiness on feet (R26.81);Other abnormalities of gait and mobility (R26.89);Muscle weakness (generalized) (M62.81)     Time: [id number] PT Time Calculation (min) (ACUTE ONLY): 31 min  Charges:  $Gait Training: 8-22 mins $Therapeutic Exercise: 8-22 mins  10:10 AM, 10/26/20 Lonell Grandchild, MPT Physical Therapist with [facility] [phone] office [phone] mobile phone

## 2020-10-27 ENCOUNTER — Encounter (HOSPITAL_COMMUNITY): Payer: Self-pay | Admitting: Internal Medicine

## 2020-10-27 DIAGNOSIS — I10 Essential (primary) hypertension: Secondary | ICD-10-CM

## 2020-10-27 DIAGNOSIS — E782 Mixed hyperlipidemia: Secondary | ICD-10-CM

## 2020-10-27 DIAGNOSIS — E08649 Diabetes mellitus due to underlying condition with hypoglycemia without coma: Secondary | ICD-10-CM

## 2020-10-27 LAB — GLUCOSE, CAPILLARY
Glucose-Capillary: 163 mg/dL — ABNORMAL HIGH (ref 70–99)
Glucose-Capillary: 269 mg/dL — ABNORMAL HIGH (ref 70–99)

## 2020-10-27 MED ORDER — PANTOPRAZOLE SODIUM 40 MG PO TBEC: 40.0000 mg | DELAYED_RELEASE_TABLET | Freq: Every day | ORAL | 1 refills | Status: AC

## 2020-10-27 MED ORDER — EZETIMIBE 10 MG PO TABS: 10.0000 mg | ORAL_TABLET | Freq: Every day | ORAL | 2 refills | Status: AC

## 2020-10-27 MED ORDER — MECLIZINE HCL 25 MG PO TABS: 25.0000 mg | ORAL_TABLET | Freq: Three times a day (TID) | ORAL | 0 refills | Status: AC | PRN

## 2020-10-27 MED ORDER — METFORMIN HCL ER 500 MG PO TB24: 1000.0000 mg | ORAL_TABLET | Freq: Two times a day (BID) | ORAL | 3 refills | Status: AC

## 2020-10-27 MED ORDER — CLOPIDOGREL BISULFATE 75 MG PO TABS: 75.0000 mg | ORAL_TABLET | Freq: Every day | ORAL | 0 refills | Status: DC

## 2020-10-27 MED ORDER — ASPIRIN 325 MG PO TABS: 325.0000 mg | ORAL_TABLET | Freq: Every day | ORAL | 0 refills | Status: DC

## 2020-10-27 NOTE — Progress Notes (Signed)
Walked in hall with walker at steady pace to nurse's station and back.  Stated that he still feels like his right leg is lazy and not back to norma but he does score zero on stroke scale.

## 2020-10-27 NOTE — Discharge Summary (Signed)
LightPhysician Discharge Summary  Darrell [name] OAC:[id number] DOB: Aug 28, 1950 DOA: 10/24/2020  PCP: Celene Squibb, MD  Admit date: 10/24/2020 Discharge date: 10/27/2020  Time spent: 35 minutes  Recommendations for Outpatient Follow-up:  Medications are prescribed Repeat complete metabolic panel to follow electrolytes, LFT's and renal function. Please reassess blood sugar and adjust hypoglycemia regimen as required. Reassess blood pressure and further adjust antihypertensive as needed.   Discharge Diagnoses:  Active Problems:   Mixed hyperlipidemia   Primary hypertension   Stroke-like symptoms   Acute stroke due to ischemia [facility]) Poorly controlled diabetes with A1c of 8.9   Discharge Condition: Stable and improved.  Discharged home with home health services and instructions to follow-up with PCP and neurology after discharge.  CODE STATUS: Full code.  Diet recommendation: Heart healthy modified carbohydrate diet  Filed Weights   10/24/20 1824 10/25/20 0500 10/27/20 0551  Weight: 82 kg 79.4 kg 74.3 kg    History of present illness:  Darrell [name] is a 70 y.o. male with medical history significant of hypertension, hyperlipidemia, gastroesophageal reflux disease, type 2 diabetes mellitus, history of coronary disease/STEMI (s/p PCI and drug-eluting stent to LAD) and prior history of TIA/lacunar infarct; who presented to the emergency department secondary to right-sided weakness and ataxia.  Patient reports symptoms started around 10 PM mild vague on 10/23/2020 (last seen normal); and a progressive throughout and worsen when he woke up on the day of admission.  Patient expressed experiencing a mechanical fall while losing balance when standing and decided to seek medical attention at that time.  He denies any fever, chills, cough, hematuria, dysuria, hematochezia, melena, abdominal pain, headaches or any other focal deficits.  Patient also expressed no chest pain or shortness of breath.    Of note, patient is now vaccinated against COVID, COVID PCR in the ED negative.  Reported no sick contacts.  Hospital Course:  1-acute left syndrome same evaluate ischemic infarct -2D echo not demonstrating abnormalities -Telemetry no revealing arrhythmias -Carotid ultrasound demonstrating plaque at the level of both carotid bulbs and proximal internal carotid arteries no significant carotid stenosis identified with estimated bilateral ICA stenosis of less than 50%. -Following recommendation by neurology service dual antiplatelet therapy for 1 month (aspirin and Plavix); continue 162 mg aspirin on daily basis for secondary prevention after that. -Continue good control of patient's blood sugar, blood pressure and cholesterol level, for risk factor modifications. -PT/OT recommending CIR; has continue making progress and improvement in his condition; at this moment would like to go home with home health services.   2-HTN -Stable overall -Continue allowing for permissive hypertension. -Heart healthy diet encouraged.   3-hyperlipidemia -LDL 116 -Continue Lipitor 80 mg daily and continue Zetia 10 mg daily. -Heart healthy diet encouraged.   4-poorly controlled type 2 diabetes mellitus -A1c 8.9 -Resume adjusted home dose of hypoglycemic regimen.   -Recommended controlled A1c less than 7. -Modify carbohydrate diet encouraged.   5-history of coronary disease -Continue aspirin, statins and beta-blocker -No chest pain or shortness of breath -Continue outpatient follow-up with cardiology service.   6-vitamin D deficiency -Continue supplementation.  Procedures: See below for x-ray and echocardiogram reports.  Consultations: Neurology service  Discharge Exam: Vitals:   10/27/20 0156 10/27/20 0507  BP: 130/67 (!) 144/78  Pulse: 63 65  Resp: 19 19  Temp: 98.6 F (37 C) 98 F (36.7 C)  SpO2: 95% 96%    General: Patient continue reporting improvement in his balance and right lower  extremity strength; no chest  pain, no nausea, no vomiting, able to speak in full sentences.  To go home with home health services. Cardiovascular: S1 and S2, no rubs, no gallops, no JVD. Respiratory: Clear to auscultation bilaterally; no using accessory muscles.  Good saturation on room air. Abdomen: Soft, nontender, nondistended, positive bowel sounds Extremities: No cyanosis or clubbing.  Discharge Instructions   Discharge Instructions     Diet - low sodium heart healthy   Complete by: As directed    Diet Carb Modified   Complete by: As directed    Discharge instructions   Complete by: As directed    Take medications as prescribed Maintain adequate hydration Follow heart healthy modified carbohydrate diet Follow-up with neurology in 4 weeks Follow-up with PCP in 2 weeks Follow instructions by home health services to further improve conditioning and rehabilitation. Continue using rolling walker 24/7 to assist with safe ambulation and balance.   Increase activity slowly   Complete by: As directed       Allergies as of 10/27/2020       Reactions   Tape Rash        Medication List     STOP taking these medications    aspirin 81 MG EC tablet Replaced by: aspirin 325 MG tablet   Brilinta 90 MG Tabs tablet Generic drug: ticagrelor       TAKE these medications    Accu-Chek Aviva Plus test strip Generic drug: glucose blood Check BS bid DX: E11.9   Accu-Chek Aviva Plus w/Device Kit Check BS bid DX: E11.9   Accu-Chek Aviva Soln Check BS bid DX: E11.9   Accu-Chek Multiclix Lancet Dev Kit Check BS bid DX: E11.9   accu-chek multiclix lancets Check BS bid DX: E11.9   acetaminophen 500 MG tablet Commonly known as: TYLENOL Take 1,000 mg by mouth every 6 (six) hours as needed for headache (pain).   ascorbic acid 500 MG tablet Commonly known as: VITAMIN C Take 500 mg by mouth daily.   aspirin 325 MG tablet Take 1 tablet (325 mg total) by mouth daily. Start  taking on: October 28, 2020 Replaces: aspirin 81 MG EC tablet   atorvastatin 80 MG tablet Commonly known as: LIPITOR Take 1 tablet (80 mg total) by mouth daily.   clopidogrel 75 MG tablet Commonly known as: PLAVIX Take 1 tablet (75 mg total) by mouth daily. Start taking on: October 28, 2020   ezetimibe 10 MG tablet Commonly known as: ZETIA Take 1 tablet (10 mg total) by mouth daily. Start taking on: October 28, 2020   glipiZIDE 5 MG 24 hr tablet Commonly known as: GLUCOTROL XL TAKE 1 TABLET BY MOUTH DAILY WITH BREAKFAST   Jardiance 25 MG Tabs tablet Generic drug: empagliflozin TAKE 1 TABLET BY MOUTH BEFORE breakfast What changed: See the new instructions.   meclizine 25 MG tablet Commonly known as: ANTIVERT Take 1 tablet (25 mg total) by mouth 3 (three) times daily as needed for dizziness.   metFORMIN 500 MG 24 hr tablet Commonly known as: GLUCOPHAGE-XR Take 2 tablets (1,000 mg total) by mouth 2 (two) times daily. What changed: See the new instructions.   metoprolol tartrate 25 MG tablet Commonly known as: LOPRESSOR Take 1 tablet (25 mg total) by mouth 2 (two) times daily. What changed: when to take this   nitroGLYCERIN 0.4 MG SL tablet Commonly known as: NITROSTAT Place 1 tablet (0.4 mg total) under the tongue every 5 (five) minutes x 3 doses as needed for chest pain.   pantoprazole  40 MG tablet Commonly known as: PROTONIX Take 1 tablet (40 mg total) by mouth daily. Start taking on: October 28, 2020   Vitamin D3 125 MCG (5000 UT) Tabs Take 5,000 Units by mouth daily.   VITAMIN E PO Take 1 capsule by mouth daily.   Zinc 50 MG Tabs Take 50 mg by mouth daily.       Allergies  Allergen Reactions   Tape Rash    Follow-up Information     Celene Squibb, MD. Schedule an appointment as soon as possible for a visit in 10 day(s).   Specialty: Internal Medicine Contact information: [address] [place]t [facility] [address] [phone]          Pixie Casino, MD .   Specialty: Cardiology Contact information: [place] [address] [phone]         Phillips Odor, MD. Schedule an appointment as soon as possible for a visit in 4 week(s).   Specialty: Neurology Contact information: [place] [address] [phone]                 The results of significant diagnostics from this hospitalization (including imaging, microbiology, ancillary and laboratory) are listed below for reference.    Significant Diagnostic Studies: MR ANGIO HEAD WO CONTRAST  Result Date: 10/24/2020 CLINICAL DATA:  Gait difficulty, history of CVA EXAM: MRI HEAD WITHOUT CONTRAST MRA HEAD WITHOUT CONTRAST TECHNIQUE: Multiplanar, multi-echo pulse sequences of the brain and surrounding structures were acquired without intravenous contrast. Angiographic images of the Circle of Willis were acquired using MRA technique without intravenous contrast. COMPARISON:  No pertinent prior exam. CT head 09/14/2020 FINDINGS: MRI HEAD FINDINGS Brain: There is a small focus of diffusion restriction in the left centrum semiovale with faint associated FLAIR signal abnormality. There is a punctate chronic microhemorrhage in the left parietal lobe. There is additional cortically based T2* hypointensity in the right frontal lobe near the vertex likely reflecting prior hemorrhage. There are remote lacunar infarcts in the bilateral cerebellar hemispheres and basal ganglia. There is patchy FLAIR signal abnormality throughout the remainder of the subcortical and periventricular white matter, nonspecific but likely reflecting sequela of chronic white matter microangiopathy. There is mild parenchymal volume loss with commensurate enlargement of the ventricular system. There is no mass lesion. There is no midline shift. Vascular: Normal flow voids. Skull and upper cervical spine: Normal marrow signal. Sinuses/Orbits: There is mild mucosal  thickening throughout the paranasal sinuses. The globes and orbits are unremarkable. Other: None. MRA HEAD FINDINGS Anterior circulation: The intracranial ICAs are patent. The bilateral MCAs and ACAs are patent. No significant stenosis, occlusion, dissection, or aneurysm is identified. Posterior circulation: The V4 segments of the vertebral arteries are patent. The right posterior cerebral artery is occluded at the P1/P2 junction. There is severe stenosis of the proximal left P2 segment measuring approximately 7 mm. The distal left PCA is patent. Anatomic variants: None. IMPRESSION: 1. Small acute infarct in the left centrum semiovale. 2. Occluded right PCA at the P1/P2 junction and severe stenosis of the left P2 segment with distal reconstitution. These findings are favored to be chronic given lack of ischemia in the occipital lobes. 3. Small remote hemorrhage in the right frontal lobe near the vertex and small remote lacunar infarcts in the bilateral basal ganglia and cerebellar hemispheres. 4. Background of parenchymal volume loss and mild chronic white matter microangiopathy. Electronically Signed   By: Valetta Mole M.D.   On: 10/24/2020  11:35   MR BRAIN WO CONTRAST  Result Date: 10/24/2020 CLINICAL DATA:  Gait difficulty, history of CVA EXAM: MRI HEAD WITHOUT CONTRAST MRA HEAD WITHOUT CONTRAST TECHNIQUE: Multiplanar, multi-echo pulse sequences of the brain and surrounding structures were acquired without intravenous contrast. Angiographic images of the Circle of Willis were acquired using MRA technique without intravenous contrast. COMPARISON:  No pertinent prior exam. CT head 09/14/2020 FINDINGS: MRI HEAD FINDINGS Brain: There is a small focus of diffusion restriction in the left centrum semiovale with faint associated FLAIR signal abnormality. There is a punctate chronic microhemorrhage in the left parietal lobe. There is additional cortically based T2* hypointensity in the right frontal lobe near the  vertex likely reflecting prior hemorrhage. There are remote lacunar infarcts in the bilateral cerebellar hemispheres and basal ganglia. There is patchy FLAIR signal abnormality throughout the remainder of the subcortical and periventricular white matter, nonspecific but likely reflecting sequela of chronic white matter microangiopathy. There is mild parenchymal volume loss with commensurate enlargement of the ventricular system. There is no mass lesion. There is no midline shift. Vascular: Normal flow voids. Skull and upper cervical spine: Normal marrow signal. Sinuses/Orbits: There is mild mucosal thickening throughout the paranasal sinuses. The globes and orbits are unremarkable. Other: None. MRA HEAD FINDINGS Anterior circulation: The intracranial ICAs are patent. The bilateral MCAs and ACAs are patent. No significant stenosis, occlusion, dissection, or aneurysm is identified. Posterior circulation: The V4 segments of the vertebral arteries are patent. The right posterior cerebral artery is occluded at the P1/P2 junction. There is severe stenosis of the proximal left P2 segment measuring approximately 7 mm. The distal left PCA is patent. Anatomic variants: None. IMPRESSION: 1. Small acute infarct in the left centrum semiovale. 2. Occluded right PCA at the P1/P2 junction and severe stenosis of the left P2 segment with distal reconstitution. These findings are favored to be chronic given lack of ischemia in the occipital lobes. 3. Small remote hemorrhage in the right frontal lobe near the vertex and small remote lacunar infarcts in the bilateral basal ganglia and cerebellar hemispheres. 4. Background of parenchymal volume loss and mild chronic white matter microangiopathy. Electronically Signed   By: Valetta Mole M.D.   On: 10/24/2020 11:35   US Carotid Bilateral (at [facility] and [facility] only)  Result Date: 10/24/2020 CLINICAL DATA:  Cerebral infarction. EXAM: BILATERAL CAROTID DUPLEX ULTRASOUND TECHNIQUE: Pearline Cables scale  imaging, color Doppler and duplex ultrasound were performed of bilateral carotid and vertebral arteries in the neck. COMPARISON:  None. FINDINGS: Criteria: Quantification of carotid stenosis is based on velocity parameters that correlate the residual internal carotid diameter with NASCET-based stenosis levels, using the diameter of the distal internal carotid lumen as the denominator for stenosis measurement. The following velocity measurements were obtained: RIGHT ICA:  105/19 cm/sec CCA:  009/38 cm/sec SYSTOLIC ICA/CCA RATIO:  1.0 ECA:  171 cm/sec LEFT ICA:  146/36 distal, 84/15 proximal cm/sec CCA:  182/99 cm/sec SYSTOLIC ICA/CCA RATIO:  1.4 ECA:  139 cm/sec RIGHT CAROTID ARTERY: Relatively mild amount of partially calcified plaque at the level of the carotid bulb and proximal right ICA. Moderate plaque present at the level of the proximal external carotid artery. Estimated right ICA stenosis is less than 50%. RIGHT VERTEBRAL ARTERY: Antegrade flow with normal waveform and velocity. LEFT CAROTID ARTERY: Mild amount of partially calcified plaque at the level of the carotid bulb and left ICA. Estimated left ICA stenosis is less than 50%. Some velocity elevation of the more distal cervical segment of the  left ICA appears to be in a tortuous segment. LEFT VERTEBRAL ARTERY: Antegrade flow with normal waveform and velocity. IMPRESSION: Plaque at the level of both carotid bulbs and proximal internal carotid arteries. No significant carotid stenosis identified with estimated bilateral ICA stenoses of less than 50%. Electronically Signed   By: Aletta Edouard M.D.   On: 10/24/2020 16:58   ECHOCARDIOGRAM COMPLETE  Result Date: 10/25/2020    ECHOCARDIOGRAM REPORT   Patient Name:   ARIE POWELL Date of Exam: 10/25/2020 Medical Rec #:  [id number]      Height:       69.0 in Accession #:    [id number]     Weight:       175.0 lb Date of Birth:  1950/09/20       BSA:          1.952 m Patient Age:    24 years       BP:            143/78 mmHg Patient Gender: M              HR:           61 bpm. Exam Location:  [facility] Procedure: 2D Echo, Cardiac Doppler and Color Doppler Indications:    Stroke l63.9  History:        Patient has prior history of Echocardiogram examinations, most                 recent 04/03/2019. Previous Myocardial Infarction; Risk                 Factors:Hypertension, Diabetes and Dyslipidemia. S/p aortic                 valve replacement ([employer] 23 mm pericardial tissue                 valve ) for severe AS.                 COVID-19.  Sonographer:    Alvino Chapel RCS Referring Phys: [place]  1. Left ventricular ejection fraction, by estimation, is 60 to 65%. The left ventricle has normal function. The left ventricle has no regional wall motion abnormalities. There is mild left ventricular hypertrophy. Left ventricular diastolic parameters are consistent with Grade I diastolic dysfunction (impaired relaxation). Elevated left atrial pressure.  2. Right ventricular systolic function is normal. The right ventricular size is normal. Tricuspid regurgitation signal is inadequate for assessing PA pressure.  3. The mitral valve is normal in structure. No evidence of mitral valve regurgitation. No evidence of mitral stenosis.  4. 23 mm Magna Ease bioprosthetic Aortic Valve is in the AV position. . The aortic valve has been repaired/replaced. There is mild calcification of the aortic valve. There is mild thickening of the aortic valve. Aortic valve regurgitation is not visualized. No aortic stenosis is present. FINDINGS  Left Ventricle: Left ventricular ejection fraction, by estimation, is 60 to 65%. The left ventricle has normal function. The left ventricle has no regional wall motion abnormalities. The left ventricular internal cavity size was normal in size. There is  mild left ventricular hypertrophy. Left ventricular diastolic parameters are consistent with Grade I diastolic dysfunction  (impaired relaxation). Elevated left atrial pressure. Right Ventricle: The right ventricular size is normal. No increase in right ventricular wall thickness. Right ventricular systolic function is normal. Tricuspid regurgitation signal is inadequate for assessing PA pressure. Left Atrium: Left atrial  size was normal in size. Right Atrium: Right atrial size was normal in size. Pericardium: There is no evidence of pericardial effusion. Mitral Valve: The mitral valve is normal in structure. There is mild thickening of the mitral valve leaflet(s). There is mild calcification of the mitral valve leaflet(s). Mild mitral annular calcification. No evidence of mitral valve regurgitation. No evidence of mitral valve stenosis. Tricuspid Valve: The tricuspid valve is normal in structure. Tricuspid valve regurgitation is mild . No evidence of tricuspid stenosis. Aortic Valve: 23 mm Magna Ease bioprosthetic Aortic Valve is in the AV position. The aortic valve has been repaired/replaced. There is mild calcification of the aortic valve. There is mild thickening of the aortic valve. There is mild aortic valve annular calcification. Aortic valve regurgitation is not visualized. No aortic stenosis is present. Aortic valve mean gradient measures 11.0 mmHg. Aortic valve peak gradient measures 20.6 mmHg. Aortic valve area, by VTI measures 1.12 cm. Pulmonic Valve: The pulmonic valve was not well visualized. Pulmonic valve regurgitation is not visualized. No evidence of pulmonic stenosis. Aorta: The aortic root is normal in size and structure. Venous: The inferior vena cava was not well visualized. IAS/Shunts: No atrial level shunt detected by color flow Doppler.  LEFT VENTRICLE PLAX 2D LVIDd:         4.40 cm  Diastology LVIDs:         2.10 cm  LV e' medial:    6.85 cm/s LV PW:         1.20 cm  LV E/e' medial:  16.1 LV IVS:        1.00 cm  LV e' lateral:   8.81 cm/s LVOT diam:     1.70 cm  LV E/e' lateral: 12.5 LV SV:         52 LV SV  Index:   27 LVOT Area:     2.27 cm  RIGHT VENTRICLE RV S prime:     12.00 cm/s TAPSE (M-mode): 2.0 cm LEFT ATRIUM             Index       RIGHT ATRIUM           Index LA diam:        3.70 cm 1.90 cm/m  RA Area:     15.70 cm LA Vol (A2C):   48.0 ml 24.59 ml/m RA Volume:   34.80 ml  17.83 ml/m LA Vol (A4C):   44.4 ml 22.74 ml/m LA Biplane Vol: 50.7 ml 25.97 ml/m  AORTIC VALVE AV Area (Vmax):    1.05 cm AV Area (Vmean):   1.17 cm AV Area (VTI):     1.12 cm AV Vmax:           227.00 cm/s AV Vmean:          152.000 cm/s AV VTI:            0.464 m AV Peak Grad:      20.6 mmHg AV Mean Grad:      11.0 mmHg LVOT Vmax:         105.00 cm/s LVOT Vmean:        78.100 cm/s LVOT VTI:          0.228 m LVOT/AV VTI ratio: 0.49  AORTA Ao Root diam: 3.50 cm MITRAL VALVE MV Area (PHT): 1.72 cm     SHUNTS MV Decel Time: 440 msec     Systemic VTI:  0.23 m MV E velocity: 110.00 cm/s  Systemic Diam:  1.70 cm MV A velocity: 132.00 cm/s MV E/A ratio:  0.83 Carlyle Dolly MD Electronically signed by Carlyle Dolly MD Signature Date/Time: 10/25/2020/2:32:15 PM    Final     Microbiology: Recent Results (from the past 240 hour(s))  Resp Panel by RT-PCR (Flu A&B, Covid) Nasopharyngeal Swab     Status: None   Collection Time: 10/24/20 10:19 AM   Specimen: Nasopharyngeal Swab; Nasopharyngeal(NP) swabs in vial transport medium  Result Value Ref Range Status   SARS Coronavirus 2 by RT PCR NEGATIVE NEGATIVE Final    Comment: (NOTE) SARS-CoV-2 target nucleic acids are NOT DETECTED.  The SARS-CoV-2 RNA is generally detectable in upper respiratory specimens during the acute phase of infection. The lowest concentration of SARS-CoV-2 viral copies this assay can detect is 138 copies/mL. A negative result does not preclude SARS-Cov-2 infection and should not be used as the sole basis for treatment or other patient management decisions. A negative result may occur with  improper specimen collection/handling, submission of specimen  other than nasopharyngeal swab, presence of viral mutation(s) within the areas targeted by this assay, and inadequate number of viral copies(<138 copies/mL). A negative result must be combined with clinical observations, patient history, and epidemiological information. The expected result is Negative.  Fact Sheet for Patients:  EntrepreneurPulse.com.au  Fact Sheet for Healthcare Providers:  IncredibleEmployment.be  This test is no t yet approved or cleared by the [place] FDA and  has been authorized for detection and/or diagnosis of SARS-CoV-2 by FDA under an Emergency Use Authorization (EUA). This EUA will remain  in effect (meaning this test can be used) for the duration of the COVID-19 declaration under Section 564(b)(1) of the Act, 21 U.S.C.section 360bbb-3(b)(1), unless the authorization is terminated  or revoked sooner.       Influenza A by PCR NEGATIVE NEGATIVE Final   Influenza B by PCR NEGATIVE NEGATIVE Final    Comment: (NOTE) The Xpert Xpress SARS-CoV-2/FLU/RSV plus assay is intended as an aid in the diagnosis of influenza from Nasopharyngeal swab specimens and should not be used as a sole basis for treatment. Nasal washings and aspirates are unacceptable for Xpert Xpress SARS-CoV-2/FLU/RSV testing.  Fact Sheet for Patients: EntrepreneurPulse.com.au  Fact Sheet for Healthcare Providers: IncredibleEmployment.be  This test is not yet approved or cleared by the [place] FDA and has been authorized for detection and/or diagnosis of SARS-CoV-2 by FDA under an Emergency Use Authorization (EUA). This EUA will remain in effect (meaning this test can be used) for the duration of the COVID-19 declaration under Section 564(b)(1) of the Act, 21 U.S.C. section 360bbb-3(b)(1), unless the authorization is terminated or revoked.  Performed at [facility], [address], [place], [place]  [address]      Labs: Basic Metabolic Panel: Recent Labs  Lab 10/24/20 0957 10/24/20 1003 10/24/20 1019 10/25/20 0550  NA 136  --  140 137  K 3.9  --  4.2 4.5  CL 106  --  105 105  CO2 23  --   --  27  GLUCOSE 187*  --  182* 136*  BUN 17  --  17 16  CREATININE 0.73  --  0.80 0.72  CALCIUM 8.8*  --   --  8.4*  MG  --  1.9  --   --    Liver Function Tests: Recent Labs  Lab 10/24/20 0957  AST 24  ALT 42  ALKPHOS 56  BILITOT 0.7  PROT 6.7  ALBUMIN 3.9   CBC: Recent Labs  Lab 10/24/20 0957 10/24/20 1019 10/25/20 0550  WBC 6.8  --  7.5  NEUTROABS 4.1  --   --   HGB 16.1 16.3 15.4  HCT 46.4 48.0 44.8  MCV 96.7  --  98.7  PLT 133*  --  125*   CBG: Recent Labs  Lab 10/26/20 1140 10/26/20 1648 10/26/20 2125 10/27/20 0717 10/27/20 1131  GLUCAP 229* 127* 371* 163* 269*    Signed:  Barton Dubois MD.  [facility] 10/27/2020, 1:21 PM

## 2020-10-27 NOTE — TOC Transition Note (Signed)
Transition of Care [facility]) - CM/SW Discharge Note   Patient Details  Name: Darrell [name] MRN: [id number] Date of Birth: 05-30-1950  Transition of Care [facility] CM/SW Contact:  Natasha Bence, LCSW Phone Number: 10/27/2020, 3:29 PM   Clinical Narrative:    CSW notified of patient's readiness for discharge. CSW referred patient to [facility] for [facility]. [facility] agreeable to provide [facility] services. TOC signing off.   Final next level of care: [place] Barriers to Discharge: Barriers Resolved   Patient Goals and CMS Choice Patient states their goals for this hospitalization and ongoing recovery are:: Discharge home with [facility] CMS Medicare.gov Compare Post Acute Care list provided to:: Patient Choice offered to / list presented to : Patient  Discharge Placement                    Patient and family notified of of transfer: 10/27/20  Discharge Plan and Services                          HH Arranged: PT [facility] Agency: [place] ([place]) Date [place]: 10/27/20 Time [place]: [place] Representative spoke with at [place]: [place] ([place]) Interventions     Readmission Risk Interventions No flowsheet data found.

## 2020-10-27 NOTE — Progress Notes (Signed)
IV removed and DC instructions reviewed.  Scripts sent to pharmacy and wife to give ride home.

## 2020-10-31 DIAGNOSIS — I2510 Atherosclerotic heart disease of native coronary artery without angina pectoris: Secondary | ICD-10-CM | POA: Diagnosis not present

## 2020-10-31 DIAGNOSIS — I10 Essential (primary) hypertension: Secondary | ICD-10-CM | POA: Diagnosis not present

## 2020-10-31 DIAGNOSIS — R413 Other amnesia: Secondary | ICD-10-CM | POA: Diagnosis not present

## 2020-10-31 DIAGNOSIS — I252 Old myocardial infarction: Secondary | ICD-10-CM | POA: Diagnosis not present

## 2020-10-31 DIAGNOSIS — I69959 Hemiplegia and hemiparesis following unspecified cerebrovascular disease affecting unspecified side: Secondary | ICD-10-CM | POA: Diagnosis not present

## 2020-10-31 DIAGNOSIS — Z952 Presence of prosthetic heart valve: Secondary | ICD-10-CM | POA: Diagnosis not present

## 2020-10-31 DIAGNOSIS — E785 Hyperlipidemia, unspecified: Secondary | ICD-10-CM | POA: Diagnosis not present

## 2020-10-31 DIAGNOSIS — E1159 Type 2 diabetes mellitus with other circulatory complications: Secondary | ICD-10-CM | POA: Diagnosis not present

## 2020-10-31 DIAGNOSIS — E559 Vitamin D deficiency, unspecified: Secondary | ICD-10-CM | POA: Diagnosis not present

## 2020-11-28 DIAGNOSIS — Z20828 Contact with and (suspected) exposure to other viral communicable diseases: Secondary | ICD-10-CM | POA: Diagnosis not present

## 2020-12-02 ENCOUNTER — Encounter: Payer: Self-pay | Admitting: Physician Assistant

## 2020-12-02 NOTE — Progress Notes (Addendum)
Cardiology Office Note    Date:  12/03/2020   ID:  OLAWALE MARNEY, DOB [birth year], MRN [id number]  PCP:  Celene Squibb, MD  Cardiologist:  Pixie Casino, MD (as of 03/2019 admission - was a patient of Dr. Ellyn Hack in 2014 and Dr. Johnsie Cancel in 2017), lives in [place] Electrophysiologist:  None   Chief Complaint: f/u stroke, also overdue for cardiac follow-up  History of Present Illness:   Darrell [name] is a 70 y.o. male with history of severe AS s/p bioprosthetic aortic valve 2013, HTN, HLD, DM, chronic appearing thrombocytopenia as of 07/2020, CAD (anterior STEMI s/p PCI/DES to mLAD 03/2019 in setting of Covid), nonobstructive carotid artery disease, arthritis, depression, DDD, hiathal hernia, BPH, diverticulosis, recent CVA who presents for overdue follow-up. He was last seen by our team in 03/2019 when hospitalized for his MI. At that time, DAPT was recommended for 12 months without interruption (ASA + Brilinta). He was recently admitted to 8/31-10/27/20 with stroke. It appears he was only on ASA coming into that hospitalization. He states today he'd taken Brilinta about a year before he stopped. He had reported 3 similar neurologic events in the last year.  Brain MRI showed: 1. Small acute infarct in the left centrum semiovale. 2. Occluded right PCA at the P1/P2 junction and severe stenosis of the left P2 segment with distal reconstitution. These findings are favored to be chronic given lack of ischemia in the occipital lobes. 3. Small remote hemorrhage in the right frontal lobe near the vertex and small remote lacunar infarcts in the bilateral basal ganglia and cerebellar hemispheres. 4. Background of parenchymal volume loss and mild chronic white matter microangiopathy.  Carotid duplex showed nonobstructive carotid stenosis. His HLD and DM were poorly controlled. 2D echo showed EF 60-65% with mild LVH, grade 1 DD, AVR present with no AS/AI. Neurology recommended DAPT for 1 month with ASA and Plavix  then ASA 122m daily. He was encouraged to restart his statin as he had not been taking this. There was no additional request to cardiology regarding need for embolic evaluation. Telemetry reported to show NSR.  He is seen for follow-up today overall doing well without any recurrent neurologic issues. Breathing stable. Uses a walker. Denies palpitations, syncope or edema. He has had 2 fleeting instances of a soreness in a focal area of his left chest wall with palpation that resolved within just a few seconds but no sustained discomfort or anginal-type pain. He states he followed with his PCP whom he states advised to continue ASA 3222mdaily instead of 16244maily. He recalls seeing Dr. DooMerlene Laughtert can't remember if that was before or after the stroke. There is a note in Care Everywhere from early August indicating a plan for f/u OV in October but not since his CVA. His atorvastatin is not currently in his pill pack bag.    Labwork independently reviewed: 8-10/2020 Hgb 15.4, plt 125, K 4.5, Cr 0.72, LDL 116, trig 206, LFTs OK, Mg 1.9  Past Medical History:  Diagnosis Date   Aortic stenosis    s/p bioprosthetic AVR 2013   Arthritis    back- low- [facility] [facility]- workmen's comp. situation    CAD in native artery    DDD (degenerative disc disease), lumbosacral    Depression    quick temper- per pt.    Diverticulosis    slight -per colonoscopy, pt. asymptomatic- thus far   GERD (gastroesophageal reflux disease)    Hiatal hernia  History of BPH    History of MRSA infection    Hyperlipidemia    Hypertension    Insomnia    Myocardial infarction ([place])    By report only, cardiac catheterization was negative for ischemia.   NIDDM (non-insulin dependent diabetes mellitus)    Type 2 NIDDM x 5 years   Prostate disease    Recurrent upper respiratory infection (URI)    cold- curently- "per pt., on the mend"   S/P AVR (aortic valve replacement)    STEMI (ST elevation myocardial infarction)  ([place])    PCI/DES x1 to mLAD   Stroke (cerebrum) ([place])    Thrombocytopenia ([place])     Past Surgical History:  Procedure Laterality Date   ABDOMINAL EXPOSURE N/A 04/01/2012   Procedure: ABDOMINAL EXPOSURE;  Surgeon: Angelia Mould, MD;  Location: [facility];  Service: Vascular;  Laterality: N/A;   ANTERIOR LUMBAR FUSION N/A 04/01/2012   Procedure: ANTERIOR LUMBAR FUSION 1 LEVEL;  Surgeon: [name], MD;  Location: [facility];  Service: Orthopedics;  Laterality: N/A;  ALIF L5-S1   AORTIC VALVE REPLACEMENT  08/05/2011   Procedure: AORTIC VALVE REPLACEMENT (AVR);  Surgeon: [name], MD;  Location: [facility];  Service: Open Heart Surgery;  Laterality: N/A;   CARDIAC CATHETERIZATION  06/2011    Minimal coronary disease   CORONARY/GRAFT ACUTE MI REVASCULARIZATION N/A 04/02/2019   Procedure: Coronary/Graft Acute MI Revascularization;  Surgeon: [name], MD;  Location: [facility];  Service: Cardiovascular;  Laterality: N/A;   LEFT AND RIGHT HEART CATHETERIZATION WITH CORONARY ANGIOGRAM  07/03/2011   Procedure: LEFT AND RIGHT HEART CATHETERIZATION WITH CORONARY ANGIOGRAM;  Surgeon: [name], MD;  Location: [facility] CATH [facility];  Service: Cardiovascular;;   LEFT HEART CATH AND CORONARY ANGIOGRAPHY N/A 04/02/2019   Procedure: LEFT HEART CATH AND CORONARY ANGIOGRAPHY;  Surgeon: [name], MD;  Location: [facility];  Service: Cardiovascular;  Laterality: N/A;   LUMBAR FUSION  04/02/2012   Dr [name]   NASAL SEPTUM SURGERY     [facility] 10 [facility] ago   NM MYOVIEW LTD  April 2013   Low risk.    Current Medications: Current Meds  Medication Sig   acetaminophen (TYLENOL) 500 MG tablet Take 1,000 mg by mouth every 6 (six) hours as needed for headache (pain).   ascorbic acid (VITAMIN C) 500 MG tablet Take 500 mg by mouth daily.   aspirin 325 MG tablet Take 1 tablet (325 mg total) by mouth daily.   atorvastatin (LIPITOR) 80 MG tablet Take 1 tablet (80 mg total) by mouth  daily.   Blood Glucose Calibration (ACCU-CHEK AVIVA) SOLN Check BS bid DX: E11.9   Blood Glucose Monitoring Suppl (ACCU-CHEK AVIVA PLUS) w/Device KIT Check BS bid DX: E11.9   Cholecalciferol (VITAMIN D3) 125 MCG (5000 UT) TABS Take 5,000 Units by mouth daily.   ezetimibe (ZETIA) 10 MG tablet Take 1 tablet (10 mg total) by mouth daily.   glipiZIDE (GLUCOTROL XL) 5 MG 24 hr tablet TAKE 1 TABLET BY MOUTH DAILY WITH BREAKFAST (Patient taking differently: Take 5 mg by mouth daily with breakfast.)   glucose blood (ACCU-CHEK AVIVA PLUS) test strip Check BS bid DX: E11.9   JARDIANCE 25 MG TABS tablet TAKE 1 TABLET BY MOUTH BEFORE breakfast (Patient taking differently: Take 25 mg by mouth daily.)   Lancets (ACCU-CHEK MULTICLIX) lancets Check BS bid DX: E11.9   Lancets Misc. (ACCU-CHEK MULTICLIX LANCET DEV) KIT Check BS bid DX: E11.9  meclizine (ANTIVERT) 25 MG tablet Take 1 tablet (25 mg total) by mouth 3 (three) times daily as needed for dizziness.   metFORMIN (GLUCOPHAGE-XR) 500 MG 24 hr tablet Take 2 tablets (1,000 mg total) by mouth 2 (two) times daily.   metoprolol tartrate (LOPRESSOR) 25 MG tablet Take 1 tablet (25 mg total) by mouth 2 (two) times daily. (Patient taking differently: Take 25 mg by mouth daily at 6 (six) AM.)   nitroGLYCERIN (NITROSTAT) 0.4 MG SL tablet Place 1 tablet (0.4 mg total) under the tongue every 5 (five) minutes x 3 doses as needed for chest pain.   pantoprazole (PROTONIX) 40 MG tablet Take 1 tablet (40 mg total) by mouth daily.   VITAMIN E PO Take 1 capsule by mouth daily.   Zinc 50 MG TABS Take 50 mg by mouth daily.     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 HE  HAS 16 GRANDCHILDREN AND 5 GREAT GRAND CHILDREN . HE  EXERCISES  NOW WALKING ABOUT 5-6 TIMES A WEEK ABOUT 30 MINUTES AT A TIME AND HAS NO PROBLEM. HE DOES NOT SMOKE ,DOES NOT DRINK ALCOHOL.   He mostly enjoys going out for walks with his grandkids.           Social Determinants of Health   Financial Resource Strain: Not on file  Food Insecurity: Not on file  Transportation Needs: Not on file  Physical Activity: Not on file  Stress: Not on file  Social Connections: Not on file     Family History:  The patient's family history includes Heart attack in his brother; Heart disease in his mother. There is no history of Anesthesia problems.  ROS:   Please see the history of present illness.  All other systems are reviewed and otherwise negative.    EKGs/Labs/Other Studies Reviewed:    Studies reviewed are outlined and summarized above. Reports included below if pertinent.  2D Echo 10/2020   1. Left ventricular ejection fraction, by estimation, is 60 to 65%. The  left ventricle has normal function. The left ventricle has no regional  wall motion abnormalities. There is mild left ventricular hypertrophy.  Left ventricular diastolic parameters  are consistent with Grade I diastolic dysfunction (impaired relaxation).  Elevated left atrial pressure.   2. Right ventricular systolic function is normal. The right ventricular  size is normal. Tricuspid regurgitation signal is inadequate for assessing  PA pressure.   3. The mitral valve is normal in structure. No evidence of mitral valve  regurgitation. No evidence of mitral stenosis.   4. 23 mm Magna Ease bioprosthetic Aortic Valve is in the AV position. .  The aortic valve has been repaired/replaced.  There is mild calcification  of the aortic valve. There is mild thickening of the aortic valve. Aortic  valve  regurgitation is not  visualized. No aortic stenosis is present.   Carotid duplex 09/2019   IMPRESSION: Plaque at the level of both carotid bulbs and proximal internal carotid arteries. No significant carotid stenosis identified with estimated bilateral ICA stenoses of less than 50%.     Electronically Signed   By: Aletta Edouard M.D.   On: 10/24/2020 16:58    Cath 03/2019 1.  Severe single-vessel coronary artery disease with critical stenosis of the mid LAD, tandem lesions treated with a long 2.5 x 38 mm resolute Onyx DES 2.  Patent left main and left circumflex without significant stenosis 3.  Moderate mid RCA stenosis, does not appear flow obstructive 4.  Mild segmental LV contraction abnormality with hypokinesis of the distal anterior wall and anteroapex, LVEF estimated at 50 to 55%   Recommendation: Dual antiplatelet therapy with aspirin and ticagrelor x12 months without interruption.  Aggressive risk reduction measures.  Medical therapy for residual CAD.  If no complications arise, anticipate hospital discharge in 48 hours.    EKG:  EKG is personally reviewed today with NSR 76bpm with sinus arrhythmia, first degree AVB, nonspecific STTW changes prior inferior and anterior infarct, similar to prior  Recent Labs: 10/24/2020: ALT 42; Magnesium 1.9; TSH 0.967 10/25/2020: BUN 16; Creatinine, Ser 0.72; Hemoglobin 15.4; Platelets 125; Potassium 4.5; Sodium 137  Recent Lipid Panel    Component Value Date/Time   CHOL 187 10/25/2020 0550   TRIG 206 (H) 10/25/2020 0550   HDL 30 (L) 10/25/2020 0550   CHOLHDL 6.2 10/25/2020 0550   VLDL 41 (H) 10/25/2020 0550   LDLCALC 116 (H) 10/25/2020 0550   LDLCALC 85 02/19/2018 0821   LDLDIRECT 134 (H) 07/26/2019 1458    PHYSICAL EXAM:    VS:  BP 130/80   Pulse 72   Ht 5' 9"  (1.753 m)   Wt 174 lb 6.4 oz (79.1 kg)   SpO2 96%   BMI  25.75 kg/m   BMI: Body mass index is 25.75 kg/m.  GEN: Well nourished, well developed male in no acute distress HEENT: normocephalic, atraumatic Neck: no JVD, carotid bruits, or masses Cardiac: RRR; no murmurs, rubs, or gallops, no edema  Respiratory:  clear to auscultation bilaterally, normal work of breathing GI: soft, nontender, nondistended, + BS MS: no deformity or atrophy Skin: warm and dry, no rash Neuro:  Alert and Oriented x 3, Strength and sensation are intact, follows commands Psych: euthymic mood, full affect  Wt Readings from Last 3 Encounters:  12/03/20 174 lb 6.4 oz (79.1 kg)  10/27/20 163 lb 12.8 oz (74.3 kg)  03/27/20 170 lb (77.1 kg)     ASSESSMENT & PLAN:   1. CAD with HLD goal LDL <70 - he has not been seen by cardiology since his MI in 03/2019. He believes he took Brilinta for a full year before going to aspirin alone. He is now on 322m dose due to recent stroke. At time of his stroke he was only on Zetia and had not been taking statin. I do not see atorvastatin in his medication bag today. He states there may be more at home that didn't make it into the bag so he will double check his medicine. I asked him to call [facility] let [facility] whether he is taking this or not so we can decide on when to recheck lipids or at least get him back on therapy. Otherwise continue metoprolol (see phone note where we will double check his dose again post-visit). He  has had 2 fleeting instances of a soreness in a very focal area of his left chest wall with palpation that resolved within just a few seconds but no sustained discomfort or anginal-type pain. His EKG appears stable. He will notify for any accelerating symptoms.  2. Aortic stenosis s/p prior aortic valve replacement - recent echo as reviewed above. Continue usual follow-up for this. SBE ppx reviewed although he has full dentures.  3. History of stroke - the patient did not recall whether he'd seen neurology post-stroke (it has  affected his memory some). Per CareEverywhere he had a visit in early August but not since the CVA. We called to double check of an upcoming appointment and it's scheduled with Dr. Merlene Laughter on 12/24/20. We will remind patient to keep that appointment. I will tentatively also plan on ordering a 30-day event monitor to evaluate for occult atrial fib. If this is found, we will need to consider initiation of anticoagulation. I will otherwise route this note to neurologist to let them know we are pursuing this, but please let cardiology know if we can be of assistance for further testing. In the meantime he will continue ASA as instructed by primary care.  4. Carotid artery plaque - nonobstructive by duplex 10/24/20 - will defer to primary cardiologist in follow-up when to recheck (consider 1-3 years). See above regarding statin.  5. Thrombocytopenia - plt count historically has been low at times. Last checked at 125 on 10/25/20. Recommend further follow-up with primary care for this.  Disposition: F/u with Gilman MD to establish care in 3-4 months as he lives in [place] and prefers this location.  Medication Adjustments/Labs and Tests Ordered: Current medicines are reviewed at length with the patient today.  Concerns regarding medicines are outlined above. Medication changes, Labs and Tests ordered today are summarized above and listed in the Patient Instructions accessible in Encounters.    Signed, Charlie Pitter, PA-C  12/03/2020 2:53 PM    [facility] Location in [place], [place] [address] Ph: [phone]; Fax [phone]

## 2020-12-03 ENCOUNTER — Other Ambulatory Visit: Payer: Self-pay

## 2020-12-03 ENCOUNTER — Ambulatory Visit (INDEPENDENT_AMBULATORY_CARE_PROVIDER_SITE_OTHER): Payer: Medicare Other | Admitting: Physician Assistant

## 2020-12-03 ENCOUNTER — Telehealth: Payer: Self-pay | Admitting: *Deleted

## 2020-12-03 ENCOUNTER — Encounter: Payer: Self-pay | Admitting: Physician Assistant

## 2020-12-03 VITALS — BP 130/80 | HR 72 | Ht 69.0 in | Wt 174.4 lb

## 2020-12-03 DIAGNOSIS — Z8673 Personal history of transient ischemic attack (TIA), and cerebral infarction without residual deficits: Secondary | ICD-10-CM | POA: Diagnosis not present

## 2020-12-03 DIAGNOSIS — I2510 Atherosclerotic heart disease of native coronary artery without angina pectoris: Secondary | ICD-10-CM | POA: Diagnosis not present

## 2020-12-03 DIAGNOSIS — I6523 Occlusion and stenosis of bilateral carotid arteries: Secondary | ICD-10-CM

## 2020-12-03 DIAGNOSIS — E785 Hyperlipidemia, unspecified: Secondary | ICD-10-CM

## 2020-12-03 DIAGNOSIS — Z952 Presence of prosthetic heart valve: Secondary | ICD-10-CM | POA: Diagnosis not present

## 2020-12-03 DIAGNOSIS — D696 Thrombocytopenia, unspecified: Secondary | ICD-10-CM

## 2020-12-03 NOTE — Telephone Encounter (Addendum)
Noted. Would continue to follow with primary care for cholesterol checks, but can we clarify his metoprolol? I thought his was listed as 25mg  BID on MAR/pill pack but now seeing it pull in as only once daily. Also please clarify if metoprolol tartrate or succinate. Thanks!

## 2020-12-03 NOTE — Telephone Encounter (Signed)
Pt called to confirm that he is taking Atorvastatin, and that Dr. Nevada Crane, his PCP, follows it.

## 2020-12-03 NOTE — Patient Instructions (Addendum)
Medication Instructions:  Your physician recommends that you continue on your current medications as directed. Please refer to the Current Medication list given to you today.  PLEASE CHECK YOUR BOTTLES AT HOME AND CALL BACK AND LET us KNOW IF YOU ARE TAKING ATORVASTATIN.    *If you need a refill on your cardiac medications before your next appointment, please call your pharmacy*   Lab Work: None ordered  If you have labs (blood work) drawn today and your tests are completely normal, you will receive your results only by: [place] (if you have MyChart) OR A paper copy in the mail If you have any lab test that is abnormal or we need to change your treatment, we will call you to review the results.   Testing/Procedures: [employer] Cardiac Event Monitor Instructions Your physician has requested you wear your cardiac event monitor for 30 days, (1-30). [employer] may call or text to confirm a shipping address. The monitor will be sent to a land address via UPS. [employer] will not ship a monitor to a PO BOX. It typically takes 3-5 days to receive your monitor after it has been enrolled. [employer] will assist with USPS tracking if your package is delayed. The telephone number for [employer] is [phone]. Once you have received your monitor, please review the enclosed instructions. Instruction tutorials can also be viewed under help and settings on the enclosed cell phone. Your monitor has already been registered assigning a specific monitor serial # to you.  Applying the monitor Remove cell phone from case and turn it on. The cell phone works as Dealer and needs to be within [employer] of you at all times. The cell phone will need to be charged on a daily basis. We recommend you plug the cell phone into the enclosed charger at your bedside table every night.  Monitor batteries: You will receive two monitor batteries labelled #1 and #2. These are your recorders.  Plug battery #2 onto the second connection on the enclosed charger. Keep one battery on the charger at all times. This will keep the monitor battery deactivated. It will also keep it fully charged for when you need to switch your monitor batteries. A small light will be blinking on the battery emblem when it is charging. The light on the battery emblem will remain on when the battery is fully charged.  Open package of a Monitor strip. Insert battery #1 into black hood on strip and gently squeeze monitor battery onto connection as indicated in instruction booklet. Set aside while preparing skin.  Choose location for your strip, vertical or horizontal, as indicated in the instruction booklet. Shave to remove all hair from location. There cannot be any lotions, oils, powders, or colognes on skin where monitor is to be applied. Wipe skin clean with enclosed Saline wipe. Dry skin completely.  Peel paper labeled #1 off the back of the Monitor strip exposing the adhesive. Place the monitor on the chest in the vertical or horizontal position shown in the instruction booklet. One arrow on the monitor strip must be pointing upward. Carefully remove paper labeled #2, attaching remainder of strip to your skin. Try not to create any folds or wrinkles in the strip as you apply it.  Firmly press and release the circle in the center of the monitor battery. You will hear a small beep. This is turning the monitor battery on. The heart emblem on the monitor battery will light up every 5 seconds if the monitor battery  in turned on and connected to the patient securely. Do not push and hold the circle down as this turns the monitor battery off. The cell phone will locate the monitor battery. A screen will appear on the cell phone checking the connection of your monitor strip. This may read poor connection initially but change to good connection within the next minute. Once your monitor accepts the connection you  will hear a series of 3 beeps followed by a climbing crescendo of beeps. A screen will appear on the cell phone showing the two monitor strip placement options. Touch the picture that demonstrates where you applied the monitor strip.  Your monitor strip and battery are waterproof. You are able to shower, bathe, or swim with the monitor on. They just ask you do not submerge deeper than 3 feet underwater. We recommend removing the monitor if you are swimming in a lake, river, or ocean.  Your monitor battery will need to be switched to a fully charged monitor battery approximately once a week. The cell phone will alert you of an action which needs to be made.  On the cell phone, tap for details to reveal connection status, monitor battery status, and cell phone battery status. The green dots indicates your monitor is in good status. A red dot indicates there is something that needs your attention.  To record a symptom, click the circle on the monitor battery. In 30-60 seconds a list of symptoms will appear on the cell phone. Select your symptom and tap save. Your monitor will record a sustained or significant arrhythmia regardless of you clicking the button. Some patients do not feel the heart rhythm irregularities. [employer] will notify us of any serious or critical events.  Refer to instruction booklet for instructions on switching batteries, changing strips, the Do not disturb or Pause features, or any additional questions.  Call [employer] at [phone], to confirm your monitor is transmitting and record your baseline. They will answer any questions you may have regarding the monitor instructions at that time.  Returning the monitor to [place] all equipment back into blue box. Peel off strip of paper to expose adhesive and close box securely. There is a prepaid UPS shipping label on this box. Drop in a UPS drop box, or at a UPS facility like [employer]. You may also contact  [employer] to arrange UPS to pick up monitor package at your home.    Follow-Up: At [facility], you and your health needs are our priority.  As part of our continuing mission to provide you with exceptional heart care, we have created designated Provider Care Teams.  These Care Teams include your primary Cardiologist (physician) and Advanced Practice Providers (APPs -  Physician Assistants and Nurse Practitioners) who all work together to provide you with the care you need, when you need it.  We recommend signing up for the patient portal called "MyChart".  Sign up information is provided on this After Visit Summary.  MyChart is used to connect with patients for Virtual Visits (Telemedicine).  Patients are able to view lab/test results, encounter notes, upcoming appointments, etc.  Non-urgent messages can be sent to your provider as well.   To learn more about what you can do with MyChart, go to [email or web address].    Your next appointment:   3 month(s)  The format for your next appointment:   In Person  Provider:   Rozann Lesches, MD or Carlyle Dolly, MD   Other Instructions YOU ARE SCHEDULED TO  SEE DR. Amboy, NEUROLOGY, 12/24/2020 AT 9:15   Endocarditis Information  You may be at risk for developing endocarditis since you have an artificial heart valve or a repaired heart valve. Endocarditis is an infection of the lining of the heart or heart valves. Certain surgical and dental procedures may put you at risk, such as teeth cleaning or other dental procedures or other medical procedures. Notify our office or your dentist before having any dental work or invasive/surgical procedures. You will need to take antibiotics before certain procedures. To prevent endocarditis, maintain good oral health. Seek prompt medical attention for any mouth/gum, skin or urinary tract infections.

## 2020-12-04 NOTE — Addendum Note (Signed)
Addended by: Levonne Hubert on: 12/04/2020 09:53 AM   Modules accepted: Orders

## 2020-12-04 NOTE — Telephone Encounter (Signed)
Call placed to pt.  He will call the [place] office back when he gets home so he can look at his pill packs and verify the dose of Metoprolol.  Will route to the [place] Triage Pool so they can be made aware.

## 2020-12-05 NOTE — Telephone Encounter (Signed)
Pt verified that he is taking Metoprolol Tartrate once daily.

## 2020-12-06 MED ORDER — METOPROLOL SUCCINATE ER 50 MG PO TB24: 50.0000 mg | ORAL_TABLET | Freq: Every day | ORAL | 3 refills | Status: DC

## 2020-12-06 NOTE — Telephone Encounter (Signed)
Our records including recent hospitalization suggest that he should be on metoprolol tartrate 25mg  BID. Please find out who prescriber is on the current metoprolol tartrate 25mg  once daily rx and call their office to confirm there was no specific reason for the dose reduction.  - If not, would increase back to metoprolol tartrate to 25mg  BID. - If it was reduced for a particular reason, please let me know but would change rx to the long-acting metoprolol succinate 25mg  daily.  Thank you!

## 2020-12-06 NOTE — Addendum Note (Signed)
Addended by: Christella Scheuermann C on: 12/06/2020 10:01 AM   Modules accepted: Orders

## 2020-12-06 NOTE — Telephone Encounter (Signed)
Spoke to pt who verbalized understanding of medication change. Medication list updated to reflect medication changes.

## 2020-12-06 NOTE — Telephone Encounter (Signed)
Yes, if going to succinate, needs 50mg  daily. Thank you.

## 2020-12-06 NOTE — Telephone Encounter (Addendum)
Rx is prescribed for 25 mg tablets BID. Pt decided to only take it once daily. Would you like to change it to the Succinate QD?

## 2020-12-10 ENCOUNTER — Ambulatory Visit (INDEPENDENT_AMBULATORY_CARE_PROVIDER_SITE_OTHER): Payer: Medicare Other

## 2020-12-10 DIAGNOSIS — Z8673 Personal history of transient ischemic attack (TIA), and cerebral infarction without residual deficits: Secondary | ICD-10-CM

## 2020-12-10 DIAGNOSIS — I6523 Occlusion and stenosis of bilateral carotid arteries: Secondary | ICD-10-CM

## 2020-12-10 DIAGNOSIS — I2510 Atherosclerotic heart disease of native coronary artery without angina pectoris: Secondary | ICD-10-CM

## 2020-12-10 DIAGNOSIS — E785 Hyperlipidemia, unspecified: Secondary | ICD-10-CM

## 2020-12-10 DIAGNOSIS — Z952 Presence of prosthetic heart valve: Secondary | ICD-10-CM

## 2020-12-10 DIAGNOSIS — D696 Thrombocytopenia, unspecified: Secondary | ICD-10-CM

## 2020-12-13 ENCOUNTER — Other Ambulatory Visit: Payer: Self-pay

## 2021-01-03 DIAGNOSIS — Z20828 Contact with and (suspected) exposure to other viral communicable diseases: Secondary | ICD-10-CM | POA: Diagnosis not present

## 2021-01-21 ENCOUNTER — Encounter: Payer: Self-pay | Admitting: *Deleted

## 2021-01-30 ENCOUNTER — Encounter: Payer: Self-pay | Admitting: *Deleted

## 2021-02-14 ENCOUNTER — Ambulatory Visit: Payer: Medicare Other | Admitting: General Surgery

## 2021-03-06 ENCOUNTER — Ambulatory Visit: Payer: Medicare Other | Admitting: Student

## 2021-03-06 NOTE — Progress Notes (Deleted)
Cardiology Office Note    Date:  03/06/2021   ID:  Darrell [name], DOB 04/12/1950, MRN [id number]  PCP:  Darrell Squibb, MD  Cardiologist: Darrell Casino, MD  (per 2021 admission) - Previously evaluated by Darrell [name] in 2017  No chief complaint on file.   History of Present Illness:    Darrell [name] is a 71 y.o. male with past medical history of CAD (s/p STEMI in 03/2019 with DES to mid-LAD), severe AS (s/p bioprosthetic AVR in 2013), HTN, HLD, Type 2 DM, chronic thrombocytopenia, diverticulosis and prior CVA who presents to the office today for 18-monthfollow-up.  He was recently examined by Darrell Copa PA-C in 11/2020 following a recent admission for a CVA.  He denied any recent palpitations or dyspnea but did report to brief episodes of chest discomfort which would resolve within a few seconds and were not associated with exertion.  A 30-day monitor was recommended to rule out an embolic etiology given his recent CVA.  This showed predominantly normal sinus rhythm with PVCs less than 1% and a 10% burden for PACs.  He was only taking Metoprolol Tartrate once daily, therefore this was transitioned to Toprol-XL 50 mg daily.    Past Medical History:  Diagnosis Date   Aortic stenosis    s/p bioprosthetic AVR 2013   Arthritis    back- low- [facility] [facility]- workmen's comp. situation    CAD in native artery    DDD (degenerative disc disease), lumbosacral    Depression    quick temper- per pt.    Diverticulosis    slight -per colonoscopy, pt. asymptomatic- thus far   GERD (gastroesophageal reflux disease)    Hiatal hernia    History of BPH    History of MRSA infection    Hyperlipidemia    Hypertension    Insomnia    [place]    By report only, cardiac catheterization was negative for ischemia.   NIDDM (non-insulin dependent diabetes mellitus)    Type 2 NIDDM x 5 years   Prostate disease    Recurrent upper respiratory infection (URI)    cold- curently- "per  pt., on the mend"   S/P AVR (aortic valve replacement)    STEMI [place] elevation myocardial infarction) ([place]    PCI/DES x1 to mLAD   [place]rebrum) [place]    [place]rombocytopenia [place]     Past Surgical History:  Procedure Laterality Date   ABDOMINAL EXPOSURE N/A 04/01/2012   Procedure: ABDOMINAL EXPOSURE;  Surgeon: CAngelia Mould MD;  [place]  Service: Vascular;  Laterality: N/A;   ANTERIOR LUMBAR FUSION N/A 04/01/2012   Procedure: ANTERIOR LUMBAR FUSION 1 LEVEL;  Surgeon: JJohnn Hai MD;  [place]  Service: Orthopedics;  Laterality: N/A;  ALIF L5-S1   AORTIC VALVE REPLACEMENT  08/05/2011   Procedure: AORTIC VALVE REPLACEMENT (AVR);  Surgeon: EGrace Isaac MD;  [place]  Service: Open Heart Surgery;  Laterality: N/A;   CARDIAC CATHETERIZATION  06/2011    Minimal coronary disease   CORONARY/GRAFT ACUTE MI REVASCULARIZATION N/A 04/02/2019   Procedure: Coronary/Graft Acute MI Revascularization;  Surgeon: CSherren Mocha MD;  [facility];  Service: Cardiovascular;  Laterality: N/A;   LEFT AND RIGHT HEART CATHETERIZATION WITH CORONARY ANGIOGRAM  07/03/2011   Procedure: LEFT AND RIGHT HEART CATHETERIZATION WITH CORONARY ANGIOGRAM;  Surgeon: MSanda Klein MD;  [facility];  Service: Cardiovascular;;   LEFT HEART CATH AND CORONARY  ANGIOGRAPHY N/A 04/02/2019   Procedure: LEFT HEART CATH AND CORONARY ANGIOGRAPHY;  Surgeon: [name], MD;  Location: [place] [facility];  Service: Cardiovascular;  Laterality: N/A;   LUMBAR FUSION  04/02/2012   Dr [name]   NASAL SEPTUM SURGERY     [facility] 10 yrs. ago   NM [facility]  April 2013   Low risk.    Current Medications: Outpatient Medications Prior to Visit  Medication Sig Dispense Refill   acetaminophen (TYLENOL) 500 MG tablet Take 1,000 mg by mouth every 6 (six) hours as needed for headache (pain).     ascorbic acid (VITAMIN C) 500 MG tablet Take 500 mg by mouth daily.      aspirin 325 MG tablet Take 1 tablet (325 mg total) by mouth daily. 30 tablet 0   atorvastatin (LIPITOR) 80 MG tablet Take 1 tablet (80 mg total) by mouth daily. 90 tablet 3   Blood Glucose Calibration (ACCU-CHEK AVIVA) SOLN Check BS bid DX: E11.9 1 each 3   Blood Glucose Monitoring Suppl (ACCU-CHEK AVIVA PLUS) w/Device KIT Check BS bid DX: E11.9 1 kit 3   Cholecalciferol (VITAMIN D3) 125 MCG (5000 UT) TABS Take 5,000 Units by mouth daily.     ezetimibe (ZETIA) 10 MG tablet Take 1 tablet (10 mg total) by mouth daily. 30 tablet 2   glipiZIDE (GLUCOTROL XL) 5 MG 24 hr tablet TAKE 1 TABLET BY MOUTH DAILY WITH BREAKFAST (Patient taking differently: Take 5 mg by mouth daily with breakfast.) 30 tablet 4   glucose blood (ACCU-CHEK AVIVA PLUS) test strip Check BS bid DX: E11.9 200 each 3   JARDIANCE 25 MG TABS tablet TAKE 1 TABLET BY MOUTH BEFORE breakfast (Patient taking differently: Take 25 mg by mouth daily.) 30 tablet 5   Lancets (ACCU-CHEK MULTICLIX) lancets Check BS bid DX: E11.9 100 each 3   Lancets Misc. (ACCU-CHEK MULTICLIX LANCET DEV) KIT Check BS bid DX: E11.9 1 kit 3   meclizine (ANTIVERT) 25 MG tablet Take 1 tablet (25 mg total) by mouth 3 (three) times daily as needed for dizziness. 45 tablet 0   metFORMIN (GLUCOPHAGE-XR) 500 MG 24 hr tablet Take 2 tablets (1,000 mg total) by mouth 2 (two) times daily. 120 tablet 3   metoprolol succinate (TOPROL-XL) 50 MG 24 hr tablet Take 1 tablet (50 mg total) by mouth daily. Take with or immediately following a meal. 90 tablet 3   nitroGLYCERIN (NITROSTAT) 0.4 MG SL tablet Place 1 tablet (0.4 mg total) under the tongue every 5 (five) minutes x 3 doses as needed for chest pain. 25 tablet 2   pantoprazole (PROTONIX) 40 MG tablet Take 1 tablet (40 mg total) by mouth daily. 30 tablet 1   VITAMIN E PO Take 1 capsule by mouth daily.     Zinc 50 MG TABS Take 50 mg by mouth daily.     No facility-administered medications prior to visit.     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 HE  HAS 16 GRANDCHILDREN AND 5 GREAT GRAND CHILDREN . HE  EXERCISES  NOW WALKING ABOUT 5-6 TIMES A WEEK ABOUT 30 MINUTES AT A TIME AND HAS NO PROBLEM. HE DOES NOT SMOKE ,DOES NOT DRINK ALCOHOL.   He mostly enjoys going out for walks with his grandkids.           Social Determinants of Health   Financial Resource Strain: Not on file  Food Insecurity: Not on file  Transportation Needs: Not on file  Physical Activity: Not on file  Stress: Not on file  Social Connections: Not on file     Family History:  The patient's ***family history includes Heart attack in his brother; Heart disease in his mother.   Review of Systems:    Please see the history of present illness.     All other systems reviewed and are otherwise negative except as noted above.   Physical Exam:    VS:  There were no vitals taken for this visit.   General: Well developed, well nourished,male appearing in no acute distress. Head: Normocephalic, atraumatic. Neck: No carotid bruits. JVD not elevated.  Lungs: Respirations regular and unlabored, without wheezes or rales.  Heart: ***Regular rate and rhythm. No S3 or S4.  No murmur, no rubs, or gallops appreciated. Abdomen: Appears non-distended. No obvious abdominal masses. Msk:  Strength and tone appear normal for age. No obvious joint deformities or effusions. Extremities: No clubbing or cyanosis. No  edema.  Distal pedal pulses are 2+ bilaterally. Neuro: Alert and oriented X 3. Moves all extremities spontaneously. No focal deficits noted. Psych:  Responds to questions appropriately with a normal affect. Skin: No rashes or lesions noted  Wt Readings from Last 3 Encounters:  12/03/20 174 lb 6.4 oz (79.1 kg)  10/27/20 163 lb 12.8 oz (74.3 kg)  03/27/20 170 lb (77.1 kg)        Studies/Labs Reviewed:   EKG:  EKG is*** ordered today.  The ekg ordered today demonstrates ***  Recent Labs: 10/24/2020: ALT 42; Magnesium 1.9; TSH 0.967 10/25/2020: BUN 16; Creatinine, Ser 0.72; Hemoglobin 15.4; Platelets 125; Potassium 4.5; Sodium 137   Lipid Panel    Component Value Date/Time   CHOL 187 10/25/2020 0550   TRIG 206 (H) 10/25/2020 0550   HDL 30 (L) 10/25/2020 0550   CHOLHDL 6.2 10/25/2020 0550   VLDL 41 (H) 10/25/2020 0550   LDLCALC 116 (H) 10/25/2020 0550   LDLCALC 85 02/19/2018 0821   LDLDIRECT 134 (H) 07/26/2019 1458    Additional studies/ records that were reviewed today include:   Echocardiogram: 10/2020 IMPRESSIONS     1. Left ventricular ejection fraction, by estimation, is 60 to 65%. The  left ventricle has normal function. The left ventricle has no regional  wall motion abnormalities. There is mild left ventricular hypertrophy.  Left ventricular diastolic parameters  are consistent with Grade I diastolic dysfunction (impaired relaxation).  Elevated left atrial pressure.   2. Right ventricular systolic function is normal. The right ventricular  size is normal. Tricuspid regurgitation signal is inadequate for assessing  PA pressure.   3. The mitral valve is normal in structure. No evidence of mitral valve  regurgitation. No evidence of mitral stenosis.   4. 23 mm Magna Ease bioprosthetic Aortic Valve is in the  AV position. .  The aortic valve has been repaired/replaced. There is mild calcification  of the aortic valve. There is mild thickening of the aortic valve.  Aortic  valve regurgitation is not  visualized. No aortic stenosis is present.   Event Monitor: 11/2020 Monitor showed sinus rhythm, PAC's and PVC's - short 4 beat run of NSVT   Assessment:    No diagnosis found.   Plan:   In order of problems listed above:  ***    Shared Decision Making/Informed Consent:   {Are you ordering a CV Procedure (e.g. stress test, cath, DCCV, TEE, etc)?   Press F2        :[id number]}    Medication Adjustments/Labs and Tests Ordered: Current medicines are reviewed at length with the patient today.  Concerns regarding medicines are outlined above.  Medication changes, Labs and Tests ordered today are listed in the Patient Instructions below. There are no Patient Instructions on file for this visit.   Signed, Erma Heritage, PA-C  03/06/2021 7:34 AM    [facility] [address] [place],  [address] Phone: [phone] Fax: [phone]

## 2021-04-16 ENCOUNTER — Encounter: Payer: Self-pay | Admitting: Family Medicine

## 2021-04-16 ENCOUNTER — Other Ambulatory Visit: Payer: Self-pay | Admitting: Family Medicine

## 2021-04-17 ENCOUNTER — Other Ambulatory Visit: Payer: Self-pay

## 2021-04-18 ENCOUNTER — Other Ambulatory Visit: Payer: Self-pay | Admitting: Family Medicine

## 2021-05-30 ENCOUNTER — Ambulatory Visit (HOSPITAL_COMMUNITY)
Admission: EM | Admit: 2021-05-30 | Discharge: 2021-05-30 | Disposition: A | Discharge status: Other Acute Inpatient Hospital | Payer: Medicare Other | Attending: Family Medicine | Admitting: Family Medicine

## 2021-05-30 ENCOUNTER — Other Ambulatory Visit (HOSPITAL_COMMUNITY): Payer: Self-pay | Admitting: Family Medicine

## 2021-05-30 ENCOUNTER — Emergency Department (HOSPITAL_COMMUNITY): Admission: EM | Admit: 2021-05-30 | Discharge: 2021-05-30 | Payer: Medicare Other | Source: Home / Self Care

## 2021-05-30 DIAGNOSIS — M5440 Lumbago with sciatica, unspecified side: Secondary | ICD-10-CM | POA: Diagnosis not present

## 2021-05-30 DIAGNOSIS — M545 Low back pain, unspecified: Secondary | ICD-10-CM | POA: Insufficient documentation

## 2021-05-30 DIAGNOSIS — M2578 Osteophyte, vertebrae: Secondary | ICD-10-CM | POA: Diagnosis not present

## 2021-05-30 DIAGNOSIS — M47816 Spondylosis without myelopathy or radiculopathy, lumbar region: Secondary | ICD-10-CM | POA: Insufficient documentation

## 2021-05-30 DIAGNOSIS — E1159 Type 2 diabetes mellitus with other circulatory complications: Secondary | ICD-10-CM | POA: Diagnosis not present

## 2021-05-30 DIAGNOSIS — Z981 Arthrodesis status: Secondary | ICD-10-CM | POA: Diagnosis not present

## 2021-05-30 DIAGNOSIS — R42 Dizziness and giddiness: Secondary | ICD-10-CM | POA: Diagnosis not present

## 2021-05-30 IMAGING — DX DG LUMBAR SPINE 2-3V
3 series · 3 of 3 positions shown · non-contrast
Comparison: X-ray lumbar [DATE].

CLINICAL DATA: Low back pain; unspecified back pain laterality,
unspecified chronicity, unspecified whether sciatica present.

EXAM:
LUMBAR SPINE - 2-3 VIEW

[l-spine ap]
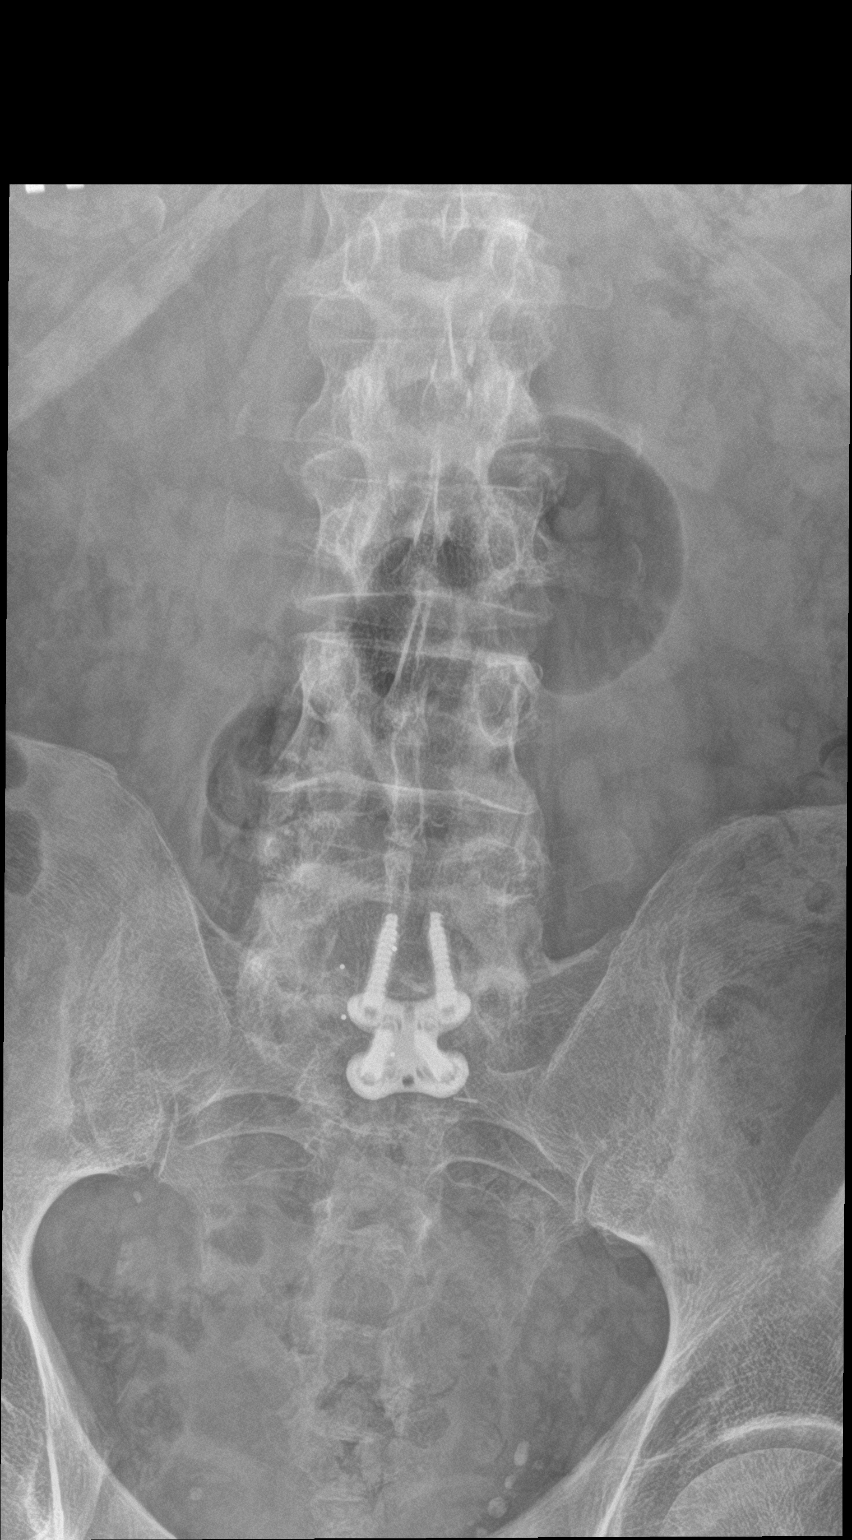

[l-spine lat]
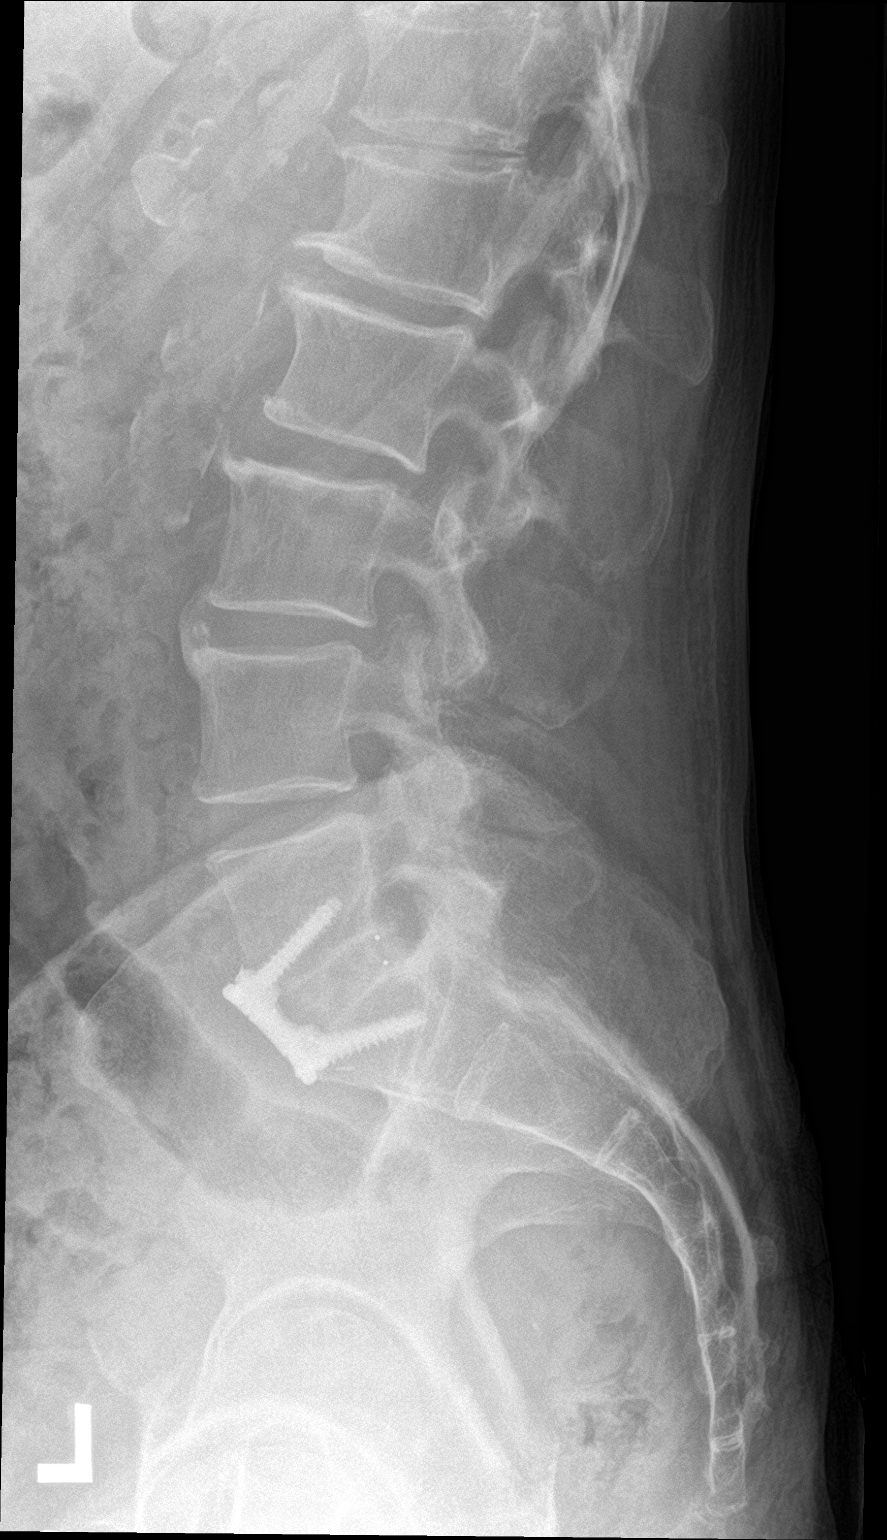

[l-spine spot]
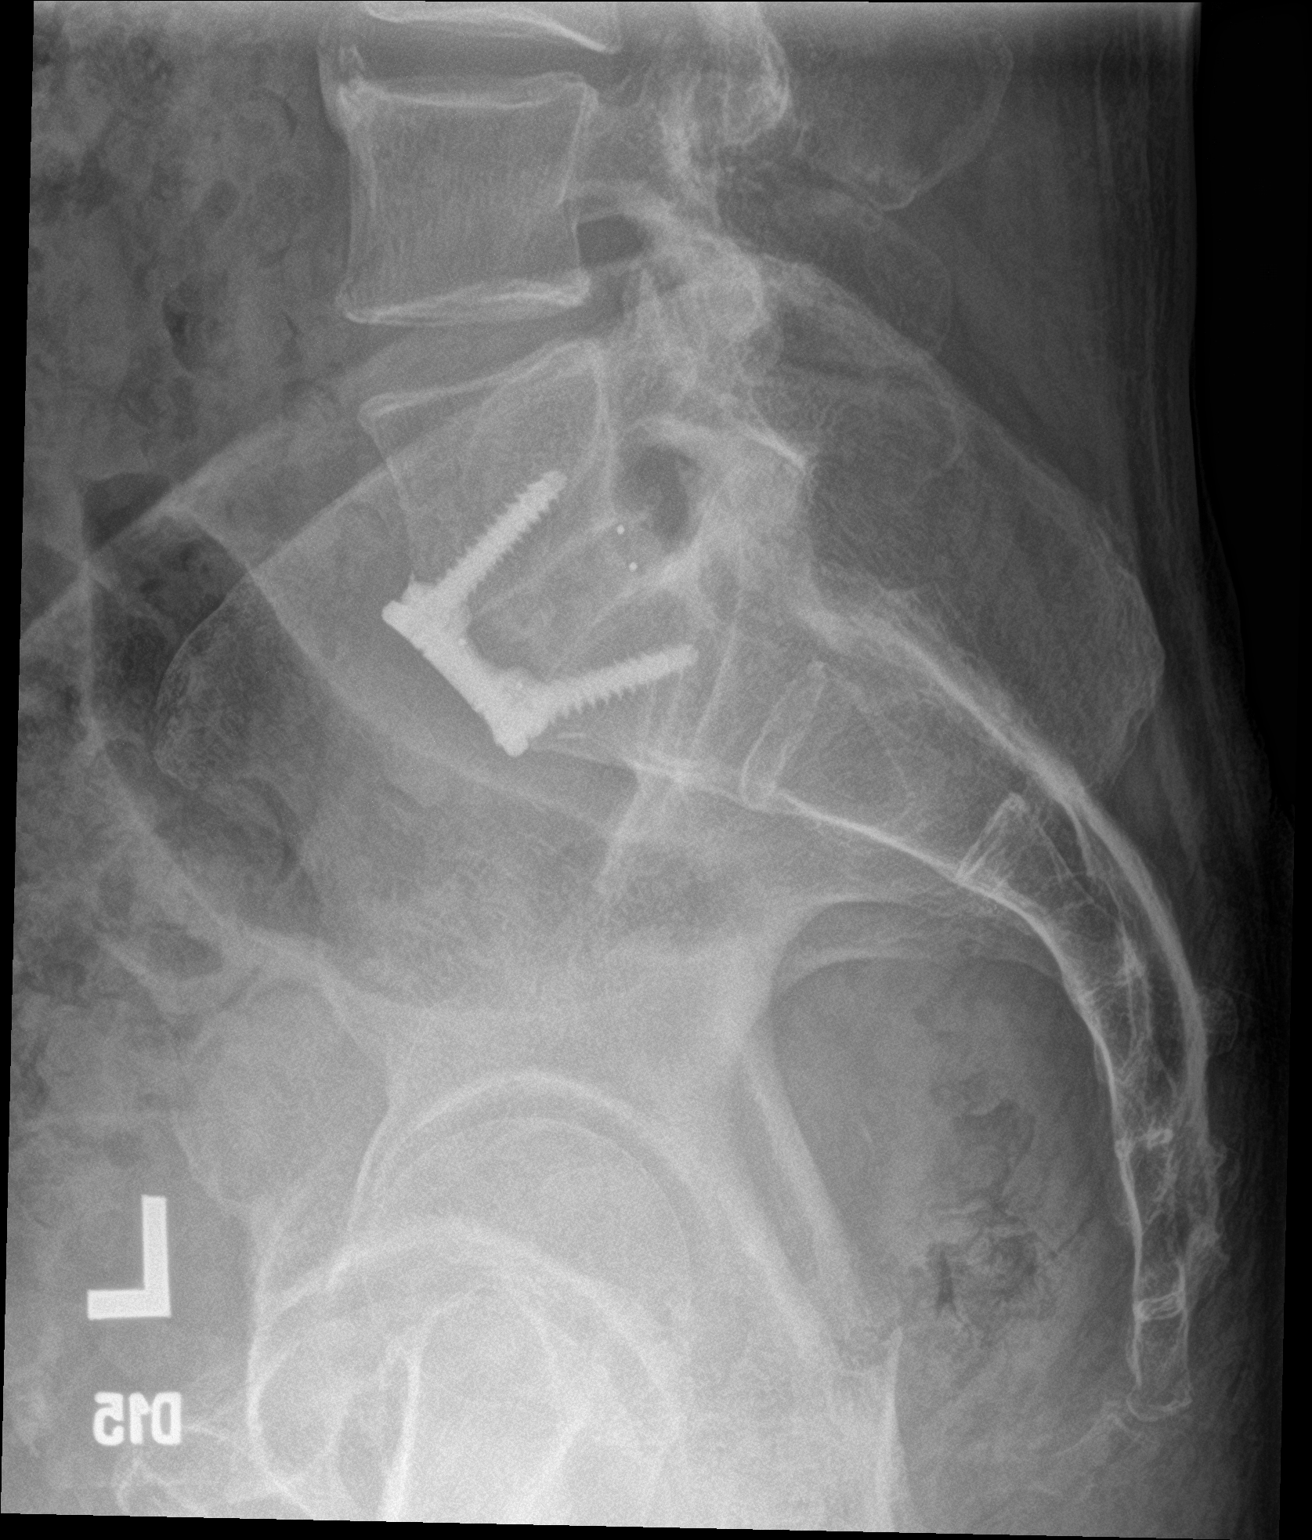

[3 of 3 positions shown; findings below may reference images not displayed]

FINDINGS: Remote anterior fusion at L5-S1 with plate and screw fixation and
incorporated interbody graft. Vertebral body heights and alignment
are maintained. There is mild disc space narrowing. Endplate
osteophytes are present. Lower lumbar facet hypertrophy.
IMPRESSION: Mild lumbar spondylosis.  Postoperative changes at L5-S1.

## 2021-05-30 NOTE — ED Triage Notes (Addendum)
Patient with complaints of lower back pain for several months that has gotten worse over the past 2 weeks. Patient is for outpatient x-ray not ED visit.  ?

## 2021-06-10 DIAGNOSIS — Z20828 Contact with and (suspected) exposure to other viral communicable diseases: Secondary | ICD-10-CM | POA: Diagnosis not present

## 2021-07-10 DIAGNOSIS — G8929 Other chronic pain: Secondary | ICD-10-CM | POA: Diagnosis not present

## 2021-07-10 DIAGNOSIS — Z6825 Body mass index (BMI) 25.0-25.9, adult: Secondary | ICD-10-CM | POA: Diagnosis not present

## 2021-07-10 DIAGNOSIS — M545 Low back pain, unspecified: Secondary | ICD-10-CM | POA: Diagnosis not present

## 2021-08-29 DIAGNOSIS — Z0000 Encounter for general adult medical examination without abnormal findings: Secondary | ICD-10-CM | POA: Diagnosis not present

## 2021-08-29 DIAGNOSIS — R42 Dizziness and giddiness: Secondary | ICD-10-CM | POA: Diagnosis not present

## 2021-08-29 DIAGNOSIS — E663 Overweight: Secondary | ICD-10-CM | POA: Diagnosis not present

## 2021-09-05 DIAGNOSIS — Z125 Encounter for screening for malignant neoplasm of prostate: Secondary | ICD-10-CM | POA: Diagnosis not present

## 2021-09-05 DIAGNOSIS — E785 Hyperlipidemia, unspecified: Secondary | ICD-10-CM | POA: Diagnosis not present

## 2021-09-05 DIAGNOSIS — I10 Essential (primary) hypertension: Secondary | ICD-10-CM | POA: Diagnosis not present

## 2021-09-05 DIAGNOSIS — E1159 Type 2 diabetes mellitus with other circulatory complications: Secondary | ICD-10-CM | POA: Diagnosis not present

## 2021-09-10 DIAGNOSIS — E1159 Type 2 diabetes mellitus with other circulatory complications: Secondary | ICD-10-CM | POA: Diagnosis not present

## 2021-09-10 DIAGNOSIS — N400 Enlarged prostate without lower urinary tract symptoms: Secondary | ICD-10-CM | POA: Diagnosis not present

## 2021-09-10 DIAGNOSIS — I6529 Occlusion and stenosis of unspecified carotid artery: Secondary | ICD-10-CM | POA: Diagnosis not present

## 2021-09-10 DIAGNOSIS — Z952 Presence of prosthetic heart valve: Secondary | ICD-10-CM | POA: Diagnosis not present

## 2021-09-10 DIAGNOSIS — I639 Cerebral infarction, unspecified: Secondary | ICD-10-CM | POA: Diagnosis not present

## 2021-09-10 DIAGNOSIS — I10 Essential (primary) hypertension: Secondary | ICD-10-CM | POA: Diagnosis not present

## 2021-09-10 DIAGNOSIS — Z125 Encounter for screening for malignant neoplasm of prostate: Secondary | ICD-10-CM | POA: Diagnosis not present

## 2021-09-10 DIAGNOSIS — R42 Dizziness and giddiness: Secondary | ICD-10-CM | POA: Diagnosis not present

## 2021-09-10 DIAGNOSIS — E785 Hyperlipidemia, unspecified: Secondary | ICD-10-CM | POA: Diagnosis not present

## 2021-09-10 DIAGNOSIS — M545 Low back pain, unspecified: Secondary | ICD-10-CM | POA: Diagnosis not present

## 2021-09-10 DIAGNOSIS — K4090 Unilateral inguinal hernia, without obstruction or gangrene, not specified as recurrent: Secondary | ICD-10-CM | POA: Diagnosis not present

## 2021-09-10 DIAGNOSIS — I252 Old myocardial infarction: Secondary | ICD-10-CM | POA: Diagnosis not present

## 2021-09-11 DIAGNOSIS — I252 Old myocardial infarction: Secondary | ICD-10-CM | POA: Diagnosis not present

## 2021-09-11 DIAGNOSIS — E785 Hyperlipidemia, unspecified: Secondary | ICD-10-CM | POA: Diagnosis not present

## 2021-09-11 DIAGNOSIS — E1136 Type 2 diabetes mellitus with diabetic cataract: Secondary | ICD-10-CM | POA: Diagnosis not present

## 2021-09-11 DIAGNOSIS — I10 Essential (primary) hypertension: Secondary | ICD-10-CM | POA: Diagnosis not present

## 2021-09-11 DIAGNOSIS — E663 Overweight: Secondary | ICD-10-CM | POA: Diagnosis not present

## 2021-09-11 DIAGNOSIS — E1142 Type 2 diabetes mellitus with diabetic polyneuropathy: Secondary | ICD-10-CM | POA: Diagnosis not present

## 2021-09-11 DIAGNOSIS — E1169 Type 2 diabetes mellitus with other specified complication: Secondary | ICD-10-CM | POA: Diagnosis not present

## 2021-11-05 DIAGNOSIS — R42 Dizziness and giddiness: Secondary | ICD-10-CM | POA: Diagnosis not present

## 2021-11-05 DIAGNOSIS — M25512 Pain in left shoulder: Secondary | ICD-10-CM | POA: Diagnosis not present

## 2021-12-13 DIAGNOSIS — Z125 Encounter for screening for malignant neoplasm of prostate: Secondary | ICD-10-CM | POA: Diagnosis not present

## 2021-12-13 DIAGNOSIS — E1159 Type 2 diabetes mellitus with other circulatory complications: Secondary | ICD-10-CM | POA: Diagnosis not present

## 2021-12-13 DIAGNOSIS — I10 Essential (primary) hypertension: Secondary | ICD-10-CM | POA: Diagnosis not present

## 2021-12-17 DIAGNOSIS — M545 Low back pain, unspecified: Secondary | ICD-10-CM | POA: Diagnosis not present

## 2021-12-17 DIAGNOSIS — N400 Enlarged prostate without lower urinary tract symptoms: Secondary | ICD-10-CM | POA: Diagnosis not present

## 2021-12-17 DIAGNOSIS — Z8673 Personal history of transient ischemic attack (TIA), and cerebral infarction without residual deficits: Secondary | ICD-10-CM | POA: Diagnosis not present

## 2021-12-17 DIAGNOSIS — E1165 Type 2 diabetes mellitus with hyperglycemia: Secondary | ICD-10-CM | POA: Diagnosis not present

## 2021-12-17 DIAGNOSIS — I252 Old myocardial infarction: Secondary | ICD-10-CM | POA: Diagnosis not present

## 2021-12-17 DIAGNOSIS — I10 Essential (primary) hypertension: Secondary | ICD-10-CM | POA: Diagnosis not present

## 2021-12-17 DIAGNOSIS — Z952 Presence of prosthetic heart valve: Secondary | ICD-10-CM | POA: Diagnosis not present

## 2021-12-17 DIAGNOSIS — Z125 Encounter for screening for malignant neoplasm of prostate: Secondary | ICD-10-CM | POA: Diagnosis not present

## 2021-12-17 DIAGNOSIS — K4090 Unilateral inguinal hernia, without obstruction or gangrene, not specified as recurrent: Secondary | ICD-10-CM | POA: Diagnosis not present

## 2021-12-17 DIAGNOSIS — E785 Hyperlipidemia, unspecified: Secondary | ICD-10-CM | POA: Diagnosis not present

## 2021-12-17 DIAGNOSIS — R42 Dizziness and giddiness: Secondary | ICD-10-CM | POA: Diagnosis not present

## 2021-12-17 DIAGNOSIS — I6529 Occlusion and stenosis of unspecified carotid artery: Secondary | ICD-10-CM | POA: Diagnosis not present

## 2022-02-12 ENCOUNTER — Other Ambulatory Visit: Payer: Self-pay | Admitting: Physician Assistant

## 2022-03-27 DIAGNOSIS — I10 Essential (primary) hypertension: Secondary | ICD-10-CM | POA: Diagnosis not present

## 2022-03-27 DIAGNOSIS — E1165 Type 2 diabetes mellitus with hyperglycemia: Secondary | ICD-10-CM | POA: Diagnosis not present

## 2022-03-27 DIAGNOSIS — Z125 Encounter for screening for malignant neoplasm of prostate: Secondary | ICD-10-CM | POA: Diagnosis not present

## 2022-03-31 DIAGNOSIS — R42 Dizziness and giddiness: Secondary | ICD-10-CM | POA: Diagnosis not present

## 2022-03-31 DIAGNOSIS — E785 Hyperlipidemia, unspecified: Secondary | ICD-10-CM | POA: Diagnosis not present

## 2022-03-31 DIAGNOSIS — I252 Old myocardial infarction: Secondary | ICD-10-CM | POA: Diagnosis not present

## 2022-03-31 DIAGNOSIS — E1165 Type 2 diabetes mellitus with hyperglycemia: Secondary | ICD-10-CM | POA: Diagnosis not present

## 2022-03-31 DIAGNOSIS — I10 Essential (primary) hypertension: Secondary | ICD-10-CM | POA: Diagnosis not present

## 2022-03-31 DIAGNOSIS — M545 Low back pain, unspecified: Secondary | ICD-10-CM | POA: Diagnosis not present

## 2022-04-04 ENCOUNTER — Ambulatory Visit: Payer: BC Managed Care – PPO | Attending: Nurse Practitioner | Admitting: Nurse Practitioner

## 2022-04-04 NOTE — Progress Notes (Deleted)
Cardiology Office Note:    Date:  04/04/2022   ID:  Darrell [name], DOB 03-Feb-1951, MRN [id number]  PCP:  Celene Squibb, MD   [place] Providers Cardiologist:  Pixie Casino, MD { Click to update primary MD,subspecialty MD or APP then REFRESH:1}    Referring MD: Celene Squibb, MD   No chief complaint on file. ***  History of Present Illness:    Darrell [name] is a 72 y.o. male with a hx of ***  Severe AS, status post bioprosthetic aortic valve in 2013 CAD Hyperlipidemia Hypertension Chronic thrombocytopenia Arthritis Depression Recent CVA  History of anterior STEMI, status post PCI/DES to mid LAD in 2021 in setting of COVID.  At that time, DAPT was recommended for 12 months without inspiration, aspirin and Brilinta.  Was admitted in August to September 2022 with stroke.  Carotid duplex showed nonobstructive carotid stenosis.  Echocardiogram showed normal EF, mild LVH, grade 1 DD, AVR present with no aortic stenosis/AI.  Neurology recommended DAPT for 1 month with aspirin and Plavix, then aspirin 162 mg daily.  Was encouraged to restart statin, he was not taking this before.  Telemetry reported normal sinus rhythm.  Last seen by Melina Copa, PA-C on December 03, 2020.  Was doing well from a cardiac perspective.  Denied any acute cardiac complaints or issues.  There was concern that he had not been taking statin.  [name]  It was asked that he call office and double check after verifying his medications.  30-day event monitor was arranged at this office visit, revealed occasional PVCs, frequent PACs, 1 brief run of NSVT.  Report referred from PCP office for. Past Medical History:  Diagnosis Date   Aortic stenosis    s/p bioprosthetic AVR 2013   Arthritis    back- low- [facility]- workmen's comp. situation    CAD in native artery    DDD (degenerative disc disease), lumbosacral    Depression     quick temper- per pt.    Diverticulosis    slight -per colonoscopy, pt. asymptomatic- thus far   GERD (gastroesophageal reflux disease)    Hiatal hernia    History of BPH    History of MRSA infection    Hyperlipidemia    Hypertension    Insomnia    Myocardial infarction ([place])    By report only, cardiac catheterization was negative for ischemia.   NIDDM (non-insulin dependent diabetes mellitus)    Type 2 NIDDM x 5 years   Prostate disease    Recurrent upper respiratory infection (URI)    cold- curently- "per pt., on the mend"   S/P AVR (aortic valve replacement)    STEMI (ST elevation myocardial infarction) ([place])    PCI/DES x1 to mLAD   Stroke (cerebrum) ([place])    Thrombocytopenia ([place])     Past Surgical History:  Procedure Laterality Date   ABDOMINAL EXPOSURE N/A 04/01/2012   Procedure: ABDOMINAL EXPOSURE;  Surgeon: Angelia Mould, MD;  Location: [facility];  Service: Vascular;  Laterality: N/A;   ANTERIOR LUMBAR FUSION N/A 04/01/2012   Procedure: ANTERIOR LUMBAR FUSION 1 LEVEL;  Surgeon: [name], MD;  Location: [facility];  Service: Orthopedics;  Laterality: N/A;  ALIF L5-S1   AORTIC VALVE REPLACEMENT  08/05/2011   Procedure: AORTIC VALVE REPLACEMENT (AVR);  Surgeon: [name], MD;  Location: [facility];  Service: Open Heart Surgery;  Laterality:  N/A;   CARDIAC CATHETERIZATION  06/2011    Minimal coronary disease   CORONARY/GRAFT ACUTE MI REVASCULARIZATION N/A 04/02/2019   Procedure: Coronary/Graft Acute MI Revascularization;  Surgeon: [name], MD;  Location: [facility];  Service: Cardiovascular;  Laterality: N/A;   LEFT AND RIGHT HEART CATHETERIZATION WITH CORONARY ANGIOGRAM  07/03/2011   Procedure: LEFT AND RIGHT HEART CATHETERIZATION WITH CORONARY ANGIOGRAM;  Surgeon: [name], MD;  Location: [facility];  Service: Cardiovascular;;   LEFT HEART CATH AND CORONARY ANGIOGRAPHY N/A 04/02/2019   Procedure: LEFT HEART CATH AND CORONARY ANGIOGRAPHY;  Surgeon:  [name], MD;  Location: [facility];  Service: Cardiovascular;  Laterality: N/A;   LUMBAR FUSION  04/02/2012   Dr [name]   NASAL SEPTUM SURGERY     [facility] 10 yrs[facility] ago   NM MYOVIEW LTD  April 2013   Low risk.    Current Medications: No outpatient medications have been marked as taking for the 04/04/22 encounter (Appointment) with Finis Bud, NP.     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 HE  HAS 16 GRANDCHILDREN AND 5 GREAT GRAND CHILDREN . HE  EXERCISES  NOW WALKING ABOUT 5-6 TIMES A WEEK ABOUT 30 MINUTES AT A TIME AND HAS NO PROBLEM. HE DOES NOT SMOKE ,DOES NOT DRINK ALCOHOL.   He mostly enjoys going out for walks with his grandkids.           Social Determinants of Health   Financial Resource Strain: Not on file  Food Insecurity: Not on file  Transportation Needs: Not on file  Physical Activity: Not on file  Stress: Not on file  Social Connections: Not on file     Family History: The patient's ***family history includes Heart attack in his brother; Heart disease in his mother. There is no history of Anesthesia problems.  ROS:   Please see the history of present illness.    *** All other systems reviewed and are negative.  EKGs/Labs/Other Studies Reviewed:    The following studies were reviewed today: ***  EKG:  EKG is *** ordered today.  The ekg ordered today demonstrates ***  Recent Labs: No  results found for requested labs within last 365 days.  Recent Lipid Panel    Component Value Date/Time   CHOL 187 10/25/2020 0550   TRIG 206 (H) 10/25/2020 0550   HDL 30 (L) 10/25/2020 0550   CHOLHDL 6.2 10/25/2020 0550   VLDL 41 (H) 10/25/2020 0550   LDLCALC 116 (H) 10/25/2020 0550   LDLCALC 85 02/19/2018 0821   LDLDIRECT 134 (H) 07/26/2019 1458     Risk Assessment/Calculations:   {Does this patient have ATRIAL FIBRILLATION?:[id number]}  No BP recorded.  {Refresh Note OR Click here to enter BP  :1}***  Physical Exam:    VS:  There were no vitals taken for this visit.    Wt Readings from Last 3 Encounters:  12/03/20 174 lb 6.4 oz (79.1 kg)  10/27/20 163 lb 12.8 oz (74.3 kg)  03/27/20 170 lb (77.1 kg)     GEN: *** Well nourished, well developed in no acute distress HEENT: Normal NECK: No JVD; No carotid bruits LYMPHATICS: No lymphadenopathy CARDIAC: ***RRR, no murmurs, rubs, gallops RESPIRATORY:  Clear to auscultation without rales, wheezing or rhonchi  ABDOMEN: Soft, non-tender, non-distended MUSCULOSKELETAL:  No edema; No deformity  SKIN: Warm and dry NEUROLOGIC:  Alert and oriented x 3 PSYCHIATRIC:  Normal affect   ASSESSMENT:    No diagnosis found. PLAN:    In order of problems listed above:  ***      {Are you ordering a CV Procedure (e.g. stress test, cath, DCCV, TEE, etc)?   Press F2        :[id number]    Medication Adjustments/Labs and Tests Ordered: Current medicines are reviewed at length with the patient today.  Concerns regarding medicines are outlined above.  No orders of the defined types were placed in this encounter.  No orders of the defined types were placed in this encounter.   There are no Patient Instructions on file for this visit.   Signed, Finis Bud, NP  04/04/2022 12:45 PM    [place]

## 2022-04-07 ENCOUNTER — Encounter: Payer: Self-pay | Admitting: Nurse Practitioner

## 2022-04-14 ENCOUNTER — Ambulatory Visit: Payer: Medicare Other | Attending: Nurse Practitioner | Admitting: Nurse Practitioner

## 2022-04-14 ENCOUNTER — Encounter: Payer: Self-pay | Admitting: Nurse Practitioner

## 2022-04-14 VITALS — BP 135/82 | HR 92 | Ht 69.0 in | Wt 167.6 lb

## 2022-04-14 DIAGNOSIS — Z952 Presence of prosthetic heart valve: Secondary | ICD-10-CM | POA: Diagnosis not present

## 2022-04-14 DIAGNOSIS — D696 Thrombocytopenia, unspecified: Secondary | ICD-10-CM

## 2022-04-14 DIAGNOSIS — I10 Essential (primary) hypertension: Secondary | ICD-10-CM | POA: Diagnosis not present

## 2022-04-14 DIAGNOSIS — I2510 Atherosclerotic heart disease of native coronary artery without angina pectoris: Secondary | ICD-10-CM | POA: Diagnosis not present

## 2022-04-14 DIAGNOSIS — I779 Disorder of arteries and arterioles, unspecified: Secondary | ICD-10-CM

## 2022-04-14 DIAGNOSIS — Z8673 Personal history of transient ischemic attack (TIA), and cerebral infarction without residual deficits: Secondary | ICD-10-CM

## 2022-04-14 DIAGNOSIS — E785 Hyperlipidemia, unspecified: Secondary | ICD-10-CM | POA: Diagnosis not present

## 2022-04-14 MED ORDER — NITROGLYCERIN 0.4 MG SL SUBL
0.4000 mg | SUBLINGUAL_TABLET | SUBLINGUAL | 2 refills | Status: AC | PRN
Start: 2022-04-14 — End: ?

## 2022-04-14 MED ORDER — ASPIRIN 81 MG PO TBEC: 162.0000 mg | DELAYED_RELEASE_TABLET | Freq: Every day | ORAL | 3 refills | Status: AC

## 2022-04-14 NOTE — Progress Notes (Unsigned)
Cardiology Office Note:    Date:  04/14/2022  ID:  Darrell [name], DOB Sep 14, 1950, MRN [id number]  PCP:  Celene Squibb, MD   [place] Providers Cardiologist:  Pixie Casino, MD     Referring MD: Celene Squibb, MD   CC: Here for follow-up  History of Present Illness:    Darrell [name] is a 72 y.o. male with a hx of the following:  Severe AS, s/p bioprosthetic aortic valve in 2013 CAD, status post STEMI Hyperlipidemia Hypertension Chronic thrombocytopenia Type 2 diabetes Nonobstructive carotid artery disease History of CVA  Patient is a 72 year old male with past medical history as mentioned above.  Previous cardiovascular history includes bioprosthetic aortic valve replacement in 2013 due to severe aortic stenosis.  Had anterior STEMI in 2021 in setting of COVID, received PCI/DES to mid LAD.  Was admitted in August - September 2022 with stroke.  Carotid duplex showed nonobstructive carotid stenosis.  Echocardiogram showed normal EF, mild LVH, grade 1 DD, AVR present with no aortic insufficiency or aortic stenosis.  Neurology recommended DAPT for 1 month with aspirin and Plavix, then aspirin 162 mg daily.  Last seen by Melina Copa, PA-C in 2022.  Denied any recurrent neurological issues.  He was doing well at the time.  EKG was stable.  Was recommended to recheck carotid artery plaque within the next 1 to 3 years, last 1 performed in 2022.  Today he presents for overdue follow-up.  He states he is doing well. Denies any chest pain, shortness of breath, palpitations, syncope, presyncope, dizziness, orthopnea, PND, swelling or significant weight changes, acute bleeding, or claudication.   SH: Enjoys doing yard work in his free time  Past Medical History:  Diagnosis Date   Aortic stenosis    s/p bioprosthetic AVR 2013   Arthritis    back- low- [facility]- workmen's comp. situation    CAD in native artery    DDD (degenerative disc disease), lumbosacral     Depression    quick temper- per pt.    Diverticulosis    slight -per colonoscopy, pt. asymptomatic- thus far   GERD (gastroesophageal reflux disease)    Hiatal hernia    History of BPH    History of MRSA infection    Hyperlipidemia    Hypertension    Insomnia    Myocardial infarction ([place])    By report only, cardiac catheterization was negative for ischemia.   NIDDM (non-insulin dependent diabetes mellitus)    Type 2 NIDDM x 5 years   Prostate disease    Recurrent upper respiratory infection (URI)    cold- curently- "per pt., on the mend"   S/P AVR (aortic valve replacement)    STEMI (ST elevation myocardial infarction) ([place])    PCI/DES x1 to mLAD   Stroke (cerebrum) ([place])    Thrombocytopenia ([place])     Past Surgical History:  Procedure Laterality Date   ABDOMINAL EXPOSURE N/A 04/01/2012   Procedure: ABDOMINAL EXPOSURE;  Surgeon: Angelia Mould, MD;  Location: [facility];  Service: Vascular;  Laterality: N/A;   ANTERIOR LUMBAR FUSION N/A 04/01/2012   Procedure: ANTERIOR LUMBAR FUSION 1 LEVEL;  Surgeon: [name], MD;  Location: [facility];  Service: Orthopedics;  Laterality: N/A;  ALIF L5-S1   AORTIC VALVE REPLACEMENT  08/05/2011   Procedure: AORTIC VALVE REPLACEMENT (AVR);  Surgeon: [name], MD;  Location: [facility];  Service: Open Heart Surgery;  Laterality: N/A;   CARDIAC CATHETERIZATION  06/2011  Minimal coronary disease   CORONARY/GRAFT ACUTE MI REVASCULARIZATION N/A 04/02/2019   Procedure: Coronary/Graft Acute MI Revascularization;  Surgeon: [name], MD;  Location: [facility];  Service: Cardiovascular;  Laterality: N/A;   LEFT AND RIGHT HEART CATHETERIZATION WITH CORONARY ANGIOGRAM  07/03/2011   Procedure: LEFT AND RIGHT HEART CATHETERIZATION WITH CORONARY ANGIOGRAM;  Surgeon: [name], MD;  Location: [facility];  Service: Cardiovascular;;   LEFT HEART CATH AND CORONARY ANGIOGRAPHY N/A 04/02/2019   Procedure: LEFT HEART CATH AND CORONARY ANGIOGRAPHY;   Surgeon: [name], MD;  Location: [facility];  Service: Cardiovascular;  Laterality: N/A;   LUMBAR FUSION  04/02/2012   Dr [name]   NASAL SEPTUM SURGERY     [facility]- 10 yrs[facility] ago   NM MYOVIEW LTD  April 2013   Low risk.    Current Medications: Current Meds  Medication Sig   acetaminophen (TYLENOL) 500 MG tablet Take 1,000 mg by mouth every 6 (six) hours as needed for headache (pain).   ascorbic acid (VITAMIN C) 500 MG tablet Take 500 mg by mouth daily.   atorvastatin (LIPITOR) 80 MG tablet Take 1 tablet (80 mg total) by mouth daily.   Blood Glucose Calibration (ACCU-CHEK AVIVA) SOLN Check BS bid DX: E11.9   Blood Glucose Monitoring Suppl (ACCU-CHEK AVIVA PLUS) w/Device KIT Check BS bid DX: E11.9   Cholecalciferol (VITAMIN D3) 125 MCG (5000 UT) CAPS Take 1 capsule by mouth daily.   ezetimibe (ZETIA) 10 MG tablet Take 1 tablet (10 mg total) by mouth daily.   glipiZIDE (GLUCOTROL XL) 5 MG 24 hr tablet TAKE 1 TABLET BY MOUTH DAILY WITH BREAKFAST   glucose blood (ACCU-CHEK AVIVA PLUS) test strip Check BS bid DX: E11.9   JARDIANCE 25 MG TABS tablet TAKE 1 TABLET BY MOUTH BEFORE breakfast   Lancets (ACCU-CHEK MULTICLIX) lancets Check BS bid DX: E11.9   Lancets Misc. (ACCU-CHEK MULTICLIX LANCET DEV) KIT Check BS bid DX: E11.9   meclizine (ANTIVERT) 25 MG tablet Take 1 tablet (25 mg total) by mouth 3 (three) times daily as needed for dizziness.   metFORMIN (GLUCOPHAGE-XR) 500 MG 24 hr tablet Take 2 tablets (1,000 mg total) by mouth 2 (two) times daily.   metoprolol tartrate (LOPRESSOR) 25 MG tablet Take 25 mg by mouth 2 (two) times daily.   pantoprazole (PROTONIX) 40 MG tablet Take 1 tablet (40 mg total) by mouth daily.   Semaglutide (RYBELSUS) 7 MG TABS Take 1 tablet by mouth every morning.   nitroGLYCERIN (NITROSTAT) 0.4 MG SL tablet Place 1 tablet (0.4 mg total) under the tongue every 5 (five) minutes x 3 doses as needed for chest pain.     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 HE  HAS 16 GRANDCHILDREN AND 5 GREAT  GRAND CHILDREN . HE  EXERCISES  NOW WALKING ABOUT 5-6 TIMES A WEEK ABOUT 30 MINUTES AT A TIME AND HAS NO PROBLEM. HE DOES NOT SMOKE ,DOES NOT DRINK ALCOHOL.   He mostly enjoys going out for walks with his grandkids.           Social Determinants of Health   Financial Resource Strain: Not on file  Food Insecurity: Not on file  Transportation Needs: Not on file  Physical Activity: Not on file  Stress: Not on file  Social Connections: Not on file     Family History: The patient's family history includes Heart attack in his brother; Heart disease in his mother. There is no history of Anesthesia problems.  ROS:   Please see the history of present illness.     All other systems reviewed and are negative.  EKGs/Labs/Other Studies Reviewed:    The following studies were reviewed today:   EKG:  EKG is  ordered today.  The ekg ordered today demonstrates SR with SA, 86 bpm, nonspecific ST segment changes similar to last EKG, no acute ischemic changes.   Cardiac monitor on 01/16/2021: NSR, PAC's/PVC's, short 4 beat run of NSVT  Echo on 10/25/2020: 1. Left ventricular ejection fraction, by estimation, is 60 to 65%. The  left ventricle has normal function. The left ventricle has no regional  wall motion abnormalities. There is mild left ventricular hypertrophy.  Left ventricular diastolic  parameters  are consistent with Grade I diastolic dysfunction (impaired relaxation).  Elevated left atrial pressure.   2. Right ventricular systolic function is normal. The right ventricular  size is normal. Tricuspid regurgitation signal is inadequate for assessing  PA pressure.   3. The mitral valve is normal in structure. No evidence of mitral valve  regurgitation. No evidence of mitral stenosis.   4. 23 mm Magna Ease bioprosthetic Aortic Valve is in the AV position. .  The aortic valve has been repaired/replaced. There is mild calcification  of the aortic valve. There is mild thickening of the aortic valve. Aortic  valve regurgitation is not  visualized. No aortic stenosis is present.  Carotid doppler on 10/24/2020: IMPRESSION: Plaque at the level of both carotid bulbs and proximal internal carotid arteries. No significant carotid stenosis identified with estimated bilateral ICA stenoses of less than 50%.   Cardiac cath on 04/02/2019: 1.  Severe single-vessel coronary artery disease with critical stenosis of the mid LAD, tandem lesions treated with a long 2.5 x 38 mm resolute Onyx DES 2.  Patent left main and left circumflex without significant stenosis 3.  Moderate mid RCA stenosis, does not appear flow obstructive 4.  Mild segmental LV contraction abnormality with hypokinesis of the distal anterior wall and anteroapex, LVEF estimated at 50 to 55%   Recommendation: Dual antiplatelet therapy with aspirin and ticagrelor x12 months without interruption.  Aggressive risk reduction measures.  Medical therapy for residual CAD.  If no complications arise, anticipate hospital discharge in 48 hours.  Myoview on 11/08/2015: Blood pressure demonstrated a normal response to exercise. Horizontal ST segment depression ST segment depression of 1 mm was noted during stress in the II, III and aVF leads. Nonspecific finding, baseline EKG shows 0.5 mm ST depressions in these leads. The study is  normal.There are no perfusion defects consistent with prior infarct or current ischemia. This is a low risk study. The left ventricular ejection fraction is normal (55-65%).  Recent Labs: No results found for requested labs within last 365 days.  Recent  Lipid Panel    Component Value Date/Time   CHOL 187 10/25/2020 0550   TRIG 206 (H) 10/25/2020 0550   HDL 30 (L) 10/25/2020 0550   CHOLHDL 6.2 10/25/2020 0550   VLDL 41 (H) 10/25/2020 0550   LDLCALC 116 (H) 10/25/2020 0550   LDLCALC 85 02/19/2018 0821   LDLDIRECT 134 (H) 07/26/2019 1458   Physical Exam:    VS:  BP 135/82 (BP Location: Left Arm, Patient Position: Sitting, Cuff Size: Normal)   Pulse 92   Ht 5' 9"$  (1.753 m)   Wt 167 lb 9.6 oz (76 kg)   SpO2 97%   BMI 24.75 kg/m     Wt Readings from Last 3 Encounters:  04/14/22 167 lb 9.6 oz (76 kg)  12/03/20 174 lb 6.4 oz (79.1 kg)  10/27/20 163 lb 12.8 oz (74.3 kg)     GEN:  Well nourished, well developed in no acute distress HEENT: Normal NECK: No JVD; No carotid bruits CARDIAC: S1/S2, irregular rhythm and regular rate, no murmurs, rubs, gallops; 2+ pulses RESPIRATORY:  Clear to auscultation without rales, wheezing or rhonchi  MUSCULOSKELETAL:  No edema; No deformity  SKIN: Warm and dry NEUROLOGIC:  Alert and oriented x 3 PSYCHIATRIC:  Normal affect   ASSESSMENT:    1. Coronary artery disease involving native heart without angina pectoris, unspecified vessel or lesion type   2. S/P TAVR (transcatheter aortic valve replacement)   3. Primary hypertension   4. Hyperlipidemia, unspecified hyperlipidemia type   5. Bilateral carotid artery disease, unspecified type ([place])   6. History of stroke   7. Thrombocytopenia ([facility])    PLAN:    In order of problems listed above:  CAD, s/p STEMI in 2021 Had anterior STEMI in 2021 in setting of COVID, received PCI/DES to mid LAD. Stable with no anginal symptoms. No indication for ischemic evaluation. After review of medications,  he is off Aspirin, not sure why. Case d/w Dr. Harl Bowie (DOD) who agreed to restart ASA 162 mg daily d/t hx of stroke. Continue Lipitor, Zetia, Lopressor, and NG PRN. Heart healthy diet and regular cardiovascular exercise encouraged. Will refill NG.   2. Severe AS, s/p bioprosthetic AVR in 2013 Doing well. Denies any issues. Echo in 2022 showed mildly calcified AVR, no AR noted. Will update Echo at this time. SBE prophylaxis discussed with pt - he verbalizes understanding. Heart healthy diet and regular cardiovascular exercise encouraged. No medication changes.   3. HTN BP on arrival 140/76, repeat BP 135/82. BP well controlled at home. Discussed to monitor BP at home at least 2 hours after medications and sitting for 5-10 minutes. No medication changes at this time. Heart healthy diet and regular cardiovascular exercise encouraged.   4. HLD Recent LDL 80. Continue Zetia and atorvastatin. Heart healthy diet and regular cardiovascular exercise encouraged.   5. Carotid artery disease Doppler in 2022 showed bilateral ICA stenosis of < 50%. Will update carotid doppler at this time. Continue current medication regimen. Heart healthy diet and regular cardiovascular exercise encouraged.   5. History of CVA Was admitted in August - September 2022 with stroke. Neurology recommended DAPT for 1 month with aspirin and Plavix, then aspirin 162 mg daily. Will restart ASA as per Neurology's recommendations. Continue rest of medication regimen. Heart healthy diet and regular cardiovascular exercise encouraged.   6. Chronic thrombocytopenia Recent platelet count 116,000. Denies any issues. Continue to follow with PCP.  7. Disposition: Pt is requesting to receive care closer to home. Follow-up with Dr.  Mallipeddi in 6 months or sooner if anything changes.    Medication Adjustments/Labs and Tests Ordered: Current medicines are reviewed at length with the patient today.  Concerns regarding medicines are outlined  above.  Orders Placed This Encounter  Procedures   EKG 12-Lead   ECHOCARDIOGRAM COMPLETE   VAS US CAROTID   Meds ordered this encounter  Medications   aspirin EC 81 MG tablet    Sig: Take 2 tablets (162 mg total) by mouth daily. Swallow whole.    Dispense:  180 tablet    Refill:  3   nitroGLYCERIN (NITROSTAT) 0.4 MG SL tablet    Sig: Place 1 tablet (0.4 mg total) under the tongue every 5 (five) minutes x 3 doses as needed for chest pain.    Dispense:  25 tablet    Refill:  2    Patient Instructions  Medication Instructions:   Start Aspirin 162 mg Daily   *If you need a refill on your cardiac medications before your next appointment, please call your pharmacy*   Lab Work:  NONE   If you have labs (blood work) drawn today and your tests are completely normal, you will receive your results only by: [facility] (if you have MyChart) OR A paper copy in the mail If you have any lab test that is abnormal or we need to change your treatment, we will call you to review the results.   Testing/Procedures: Your physician has requested that you have a carotid duplex. This test is an ultrasound of the carotid arteries in your neck. It looks at blood flow through these arteries that supply the brain with blood. Allow one hour for this exam. There are no restrictions or special instructions.  Your physician has requested that you have an echocardiogram. Echocardiography is a painless test that uses sound waves to create images of your heart. It provides your doctor with information about the size and shape of your heart and how well your heart's chambers and valves are working. This procedure takes approximately one hour. There are no restrictions for this procedure. Please do NOT wear cologne, perfume, aftershave, or lotions (deodorant is allowed). Please arrive 15 minutes prior to your appointment time.    Follow-Up: At [facility], you and your health needs are our  priority.  As part of our continuing mission to provide you with exceptional heart care, we have created designated Provider Care Teams.  These Care Teams include your primary Cardiologist (physician) and Advanced Practice Providers (APPs -  Physician Assistants and Nurse Practitioners) who all work together to provide you with the care you need, when you need it.  We recommend signing up for the patient portal called "MyChart".  Sign up information is provided on this After Visit Summary.  MyChart is used to connect with patients for Virtual Visits (Telemedicine).  Patients are able to view lab/test results, encounter notes, upcoming appointments, etc.  Non-urgent messages can be sent to your provider as well.   To learn more about what you can do with MyChart, go to [email or web address].    Your next appointment:   6 month(s)  Provider:   Claudina Lick, MD    Other Instructions Thank you for choosing [facility]!      Signed, Finis Bud, NP  04/15/2022 8:25 PM    [place]

## 2022-04-14 NOTE — Patient Instructions (Signed)
Medication Instructions:   Start Aspirin 162 mg Daily   *If you need a refill on your cardiac medications before your next appointment, please call your pharmacy*   Lab Work:  NONE   If you have labs (blood work) drawn today and your tests are completely normal, you will receive your results only by: [place] (if you have MyChart) OR A paper copy in the mail If you have any lab test that is abnormal or we need to change your treatment, we will call you to review the results.   Testing/Procedures: Your physician has requested that you have a carotid duplex. This test is an ultrasound of the carotid arteries in your neck. It looks at blood flow through these arteries that supply the brain with blood. Allow one hour for this exam. There are no restrictions or special instructions.  Your physician has requested that you have an echocardiogram. Echocardiography is a painless test that uses sound waves to create images of your heart. It provides your doctor with information about the size and shape of your heart and how well your heart's chambers and valves are working. This procedure takes approximately one hour. There are no restrictions for this procedure. Please do NOT wear cologne, perfume, aftershave, or lotions (deodorant is allowed). Please arrive 15 minutes prior to your appointment time.    Follow-Up: At [facility], you and your health needs are our priority.  As part of our continuing mission to provide you with exceptional heart care, we have created designated Provider Care Teams.  These Care Teams include your primary Cardiologist (physician) and Advanced Practice Providers (APPs -  Physician Assistants and Nurse Practitioners) who all work together to provide you with the care you need, when you need it.  We recommend signing up for the patient portal called "MyChart".  Sign up information is provided on this After Visit Summary.  MyChart is used to connect  with patients for Virtual Visits (Telemedicine).  Patients are able to view lab/test results, encounter notes, upcoming appointments, etc.  Non-urgent messages can be sent to your provider as well.   To learn more about what you can do with MyChart, go to [email or web address].    Your next appointment:   6 month(s)  Provider:   Claudina Lick, MD    Other Instructions Thank you for choosing [facility]!

## 2022-04-15 ENCOUNTER — Encounter: Payer: Self-pay | Admitting: Nurse Practitioner

## 2022-04-16 ENCOUNTER — Other Ambulatory Visit: Payer: Self-pay | Admitting: Nurse Practitioner

## 2022-04-16 DIAGNOSIS — D696 Thrombocytopenia, unspecified: Secondary | ICD-10-CM

## 2022-04-16 DIAGNOSIS — I359 Nonrheumatic aortic valve disorder, unspecified: Secondary | ICD-10-CM

## 2022-04-16 DIAGNOSIS — I2510 Atherosclerotic heart disease of native coronary artery without angina pectoris: Secondary | ICD-10-CM

## 2022-04-16 DIAGNOSIS — E785 Hyperlipidemia, unspecified: Secondary | ICD-10-CM

## 2022-04-16 DIAGNOSIS — Z8673 Personal history of transient ischemic attack (TIA), and cerebral infarction without residual deficits: Secondary | ICD-10-CM

## 2022-04-16 DIAGNOSIS — I10 Essential (primary) hypertension: Secondary | ICD-10-CM

## 2022-04-16 DIAGNOSIS — Z952 Presence of prosthetic heart valve: Secondary | ICD-10-CM

## 2022-04-16 DIAGNOSIS — I779 Disorder of arteries and arterioles, unspecified: Secondary | ICD-10-CM

## 2022-04-30 ENCOUNTER — Other Ambulatory Visit: Payer: BC Managed Care – PPO

## 2022-05-20 ENCOUNTER — Other Ambulatory Visit: Payer: Self-pay | Admitting: Nurse Practitioner

## 2022-05-20 DIAGNOSIS — I2510 Atherosclerotic heart disease of native coronary artery without angina pectoris: Secondary | ICD-10-CM

## 2022-05-20 DIAGNOSIS — I359 Nonrheumatic aortic valve disorder, unspecified: Secondary | ICD-10-CM

## 2022-05-20 DIAGNOSIS — I6523 Occlusion and stenosis of bilateral carotid arteries: Secondary | ICD-10-CM

## 2022-05-21 ENCOUNTER — Ambulatory Visit: Payer: Medicare Other | Attending: Nurse Practitioner

## 2022-05-21 ENCOUNTER — Ambulatory Visit (INDEPENDENT_AMBULATORY_CARE_PROVIDER_SITE_OTHER): Payer: Medicare Other

## 2022-05-21 DIAGNOSIS — I6523 Occlusion and stenosis of bilateral carotid arteries: Secondary | ICD-10-CM

## 2022-05-21 DIAGNOSIS — I359 Nonrheumatic aortic valve disorder, unspecified: Secondary | ICD-10-CM | POA: Diagnosis not present

## 2022-05-21 DIAGNOSIS — I2510 Atherosclerotic heart disease of native coronary artery without angina pectoris: Secondary | ICD-10-CM

## 2022-05-22 LAB — ECHOCARDIOGRAM COMPLETE
AR max vel: 1.27 cm2
AV Area VTI: 1.34 cm2
AV Area mean vel: 1.34 cm2
AV Mean grad: 12.8 mmHg
AV Peak grad: 24.9 mmHg
Ao pk vel: 2.5 m/s
Area-P 1/2: 1.79 cm2
Calc EF: 66.6 %
S' Lateral: 1.9 cm
Single Plane A2C EF: 62 %
Single Plane A4C EF: 68.6 %

## 2022-09-03 DIAGNOSIS — E1165 Type 2 diabetes mellitus with hyperglycemia: Secondary | ICD-10-CM | POA: Diagnosis not present

## 2022-09-03 DIAGNOSIS — Z125 Encounter for screening for malignant neoplasm of prostate: Secondary | ICD-10-CM | POA: Diagnosis not present

## 2022-09-03 DIAGNOSIS — I10 Essential (primary) hypertension: Secondary | ICD-10-CM | POA: Diagnosis not present

## 2022-09-05 DIAGNOSIS — E1165 Type 2 diabetes mellitus with hyperglycemia: Secondary | ICD-10-CM | POA: Diagnosis not present

## 2022-09-05 DIAGNOSIS — I10 Essential (primary) hypertension: Secondary | ICD-10-CM | POA: Diagnosis not present

## 2022-09-05 DIAGNOSIS — R42 Dizziness and giddiness: Secondary | ICD-10-CM | POA: Diagnosis not present

## 2022-09-05 DIAGNOSIS — I252 Old myocardial infarction: Secondary | ICD-10-CM | POA: Diagnosis not present

## 2022-09-05 DIAGNOSIS — M545 Low back pain, unspecified: Secondary | ICD-10-CM | POA: Diagnosis not present

## 2022-10-20 ENCOUNTER — Ambulatory Visit: Payer: Medicare Other | Attending: Internal Medicine | Admitting: Internal Medicine

## 2022-10-20 NOTE — Progress Notes (Signed)
Erroneous encounter - please disregard.

## 2022-10-21 ENCOUNTER — Encounter: Payer: Self-pay | Admitting: Internal Medicine

## 2022-11-16 DIAGNOSIS — E119 Type 2 diabetes mellitus without complications: Secondary | ICD-10-CM | POA: Diagnosis not present

## 2022-11-16 DIAGNOSIS — I63521 Cerebral infarction due to unspecified occlusion or stenosis of right anterior cerebral artery: Secondary | ICD-10-CM | POA: Diagnosis not present

## 2022-11-16 DIAGNOSIS — I63532 Cerebral infarction due to unspecified occlusion or stenosis of left posterior cerebral artery: Secondary | ICD-10-CM | POA: Diagnosis not present

## 2022-11-16 DIAGNOSIS — I63512 Cerebral infarction due to unspecified occlusion or stenosis of left middle cerebral artery: Secondary | ICD-10-CM | POA: Diagnosis not present

## 2022-11-16 DIAGNOSIS — I10 Essential (primary) hypertension: Secondary | ICD-10-CM | POA: Diagnosis not present

## 2022-11-17 DIAGNOSIS — I6389 Other cerebral infarction: Secondary | ICD-10-CM | POA: Diagnosis not present

## 2022-11-18 DIAGNOSIS — I63511 Cerebral infarction due to unspecified occlusion or stenosis of right middle cerebral artery: Secondary | ICD-10-CM | POA: Diagnosis not present

## 2022-11-19 DIAGNOSIS — I63511 Cerebral infarction due to unspecified occlusion or stenosis of right middle cerebral artery: Secondary | ICD-10-CM | POA: Diagnosis not present

## 2022-11-20 DIAGNOSIS — I63511 Cerebral infarction due to unspecified occlusion or stenosis of right middle cerebral artery: Secondary | ICD-10-CM | POA: Diagnosis not present
